# Patient Record
Sex: Female | Born: 1965 | Race: White | Hispanic: No | Smoking: Never smoker
Health system: Southern US, Community
[De-identification: ages and names within clinical notes are randomized; demographics above are authoritative.]

## PROBLEM LIST (undated history)

## (undated) DIAGNOSIS — M1990 Unspecified osteoarthritis, unspecified site: Secondary | ICD-10-CM

## (undated) DIAGNOSIS — E049 Nontoxic goiter, unspecified: Secondary | ICD-10-CM

## (undated) DIAGNOSIS — N83209 Unspecified ovarian cyst, unspecified side: Secondary | ICD-10-CM

## (undated) DIAGNOSIS — I10 Essential (primary) hypertension: Secondary | ICD-10-CM

## (undated) DIAGNOSIS — F419 Anxiety disorder, unspecified: Secondary | ICD-10-CM

## (undated) DIAGNOSIS — B019 Varicella without complication: Secondary | ICD-10-CM

## (undated) DIAGNOSIS — E119 Type 2 diabetes mellitus without complications: Secondary | ICD-10-CM

## (undated) DIAGNOSIS — M797 Fibromyalgia: Secondary | ICD-10-CM

## (undated) DIAGNOSIS — E039 Hypothyroidism, unspecified: Secondary | ICD-10-CM

## (undated) DIAGNOSIS — Z9889 Other specified postprocedural states: Secondary | ICD-10-CM

## (undated) DIAGNOSIS — F32A Depression, unspecified: Secondary | ICD-10-CM

## (undated) DIAGNOSIS — R112 Nausea with vomiting, unspecified: Secondary | ICD-10-CM

## (undated) DIAGNOSIS — F329 Major depressive disorder, single episode, unspecified: Secondary | ICD-10-CM

## (undated) HISTORY — PX: TONSILLECTOMY: SUR1361

## (undated) HISTORY — DX: Unspecified ovarian cyst, unspecified side: N83.209

## (undated) HISTORY — PX: LIPOMA EXCISION: SHX5283

## (undated) HISTORY — PX: HERNIA REPAIR: SHX51

## (undated) HISTORY — PX: CHOLECYSTECTOMY: SHX55

## (undated) HISTORY — PX: TUMOR EXCISION: SHX421

## (undated) HISTORY — DX: Varicella without complication: B01.9

## (undated) HISTORY — DX: Unspecified osteoarthritis, unspecified site: M19.90

---

## 2010-10-24 ENCOUNTER — Inpatient Hospital Stay (INDEPENDENT_AMBULATORY_CARE_PROVIDER_SITE_OTHER)
Admission: RE | Admit: 2010-10-24 | Discharge: 2010-10-24 | Disposition: A | Discharge status: Home / Self Care | Payer: BC Managed Care – PPO | Source: Ambulatory Visit | Attending: Family Medicine | Admitting: Family Medicine

## 2010-10-24 ENCOUNTER — Encounter: Payer: Self-pay | Admitting: Family Medicine

## 2010-10-24 ENCOUNTER — Inpatient Hospital Stay
Admission: RE | Admit: 2010-10-24 | Payer: BC Managed Care – PPO | Source: Ambulatory Visit | Admitting: Emergency Medicine

## 2010-10-24 DIAGNOSIS — J069 Acute upper respiratory infection, unspecified: Secondary | ICD-10-CM

## 2010-10-24 DIAGNOSIS — J029 Acute pharyngitis, unspecified: Secondary | ICD-10-CM

## 2010-10-25 ENCOUNTER — Encounter (INDEPENDENT_AMBULATORY_CARE_PROVIDER_SITE_OTHER): Payer: Self-pay | Admitting: *Deleted

## 2010-10-26 ENCOUNTER — Telehealth (INDEPENDENT_AMBULATORY_CARE_PROVIDER_SITE_OTHER): Payer: Self-pay | Admitting: Emergency Medicine

## 2010-10-28 ENCOUNTER — Telehealth (INDEPENDENT_AMBULATORY_CARE_PROVIDER_SITE_OTHER): Payer: Self-pay | Admitting: *Deleted

## 2011-01-02 NOTE — Telephone Encounter (Signed)
  Phone Note Call from Patient Call back at Home Phone [phone]   Caller: Patient Call For: extend work note Summary of Call: Pateint called on 10/27/2010 wanting her work note to be extended into 10/27/2010. I had already extended it for her once for 2 more days. I told Michelle Mueller that the doctor would have to make the decision on whether or not to extend the note again.  Dr. Cathren Harsh, please call her or let me know if its okay to extend the note again and I will write it. Thanks! Her best number is [phone] Initial call taken by: Lajean Saver RN,  October 28, 2010 1:56 PM    OK to extend note through weekend.  If still has persistent symptoms on Monday 11/01/10 should be re-checked. Donna Christen MD  October 28, 2010 2:48 PM

## 2011-01-02 NOTE — Letter (Signed)
Summary: Out of Work  [facility]  [address]   [place], [place] [address]   Phone: [phone]  Fax: [phone]    October 24, 2010   Employee:  KAREE FORGE    To Whom It May Concern:   For Medical reasons, please excuse the above named employee from work today and tomorrow.   If you need additional information, please feel free to contact our office.         Sincerely,    Donna Christen MD

## 2011-01-02 NOTE — Telephone Encounter (Signed)
  Phone Note Outgoing Call   Call placed by: Lavell Islam RN,  October 26, 2010 3:10 PM Call placed to: Patient Action Taken: Phone Call Completed Summary of Call: Spoke with patient who states felt better at 24 hours; now feeling bad again. Taking meds as ordered. Willing to see how she feels tomorrow. Initial call taken by: Lavell Islam RN,  October 26, 2010 3:11 PM

## 2011-01-02 NOTE — Letter (Signed)
Summary: Out of Work  [facility]  [address]   [place], [place] [address]   Phone: [phone]  Fax: [phone]    October 25, 2010   Employee:  SOLYMAR GRACE    To Whom It May Concern:   For Medical reasons, please excuse the above named employee from work for the following dates:  Start: 10/24/2010  Return: 10/27/2010    If you need additional information, please feel free to contact our office.         Sincerely,    Lajean Saver RN

## 2011-01-02 NOTE — Progress Notes (Signed)
Summary: sore throat ?   Vital Signs:  Patient Profile:   45 Years Old Female CC:      Swollen throat, Neg Strep test at [facility] today, Body aches, fever, headache x 7 days worse yesterday Height:     64 inches Weight:      221 pounds O2 Sat:      97 % O2 treatment:    Room Air Temp:     99.7 degrees F oral Pulse rate:   124 / minute Pulse rhythm:   regular Resp:     18 per minute BP sitting:   125 / 87  (left arm) Cuff size:   regular  Vitals Entered By: Emilio Math (October 24, 2010 11:45 AM)                  Current Allergies: No known allergies History of Present Illness Chief Complaint: Swollen throat, Neg Strep test at [facility] today, Body aches, fever, headache x 7 days worse yesterday History of Present Illness:  Subjective: Patient complains of onset of myalgias and fatigue about 5 days ago that improved, then 2 days ago developed a severe sore throat.  She then developed recurrent myalgias with chills/sweats.  No improvement with ibuprofen.  She has a history of seasonal allergies.  Negative rapid strep test at [facility] this morning. Minimal cough No pleuritic pain No wheezing + mild nasal congestion + post-nasal drainage No sinus pain/pressure No itchy/red eyes No earache No hemoptysis No SOB No nausea No vomiting No abdominal pain No diarrhea No skin rashes No headache Used OTC meds without relief (Zyrtec)   REVIEW OF SYSTEMS Constitutional Symptoms       Complains of fever.     Denies chills, night sweats, weight loss, weight gain, and fatigue.  Eyes       Denies change in vision, eye pain, eye discharge, glasses, contact lenses, and eye surgery. Ear/Nose/Throat/Mouth       Complains of sinus problems, sore throat, and hoarseness.      Denies hearing loss/aids, change in hearing, ear pain, ear discharge, dizziness, frequent runny nose, frequent nose bleeds, and tooth pain or bleeding.  Respiratory       Denies dry cough,  productive cough, wheezing, shortness of breath, asthma, bronchitis, and emphysema/COPD.  Cardiovascular       Denies murmurs, chest pain, and tires easily with exhertion.    Gastrointestinal       Denies stomach pain, nausea/vomiting, diarrhea, constipation, blood in bowel movements, and indigestion. Genitourniary       Denies painful urination, kidney stones, and loss of urinary control. Neurological       Complains of headaches.      Denies paralysis, seizures, and fainting/blackouts. Musculoskeletal       Complains of muscle pain and joint pain.      Denies joint stiffness, decreased range of motion, redness, swelling, muscle weakness, and gout.  Skin       Denies bruising, unusual mles/lumps or sores, and hair/skin or nail changes.  Psych       Denies mood changes, temper/anger issues, anxiety/stress, speech problems, depression, and sleep problems.  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  Extraocular movement is intact.  Conjunctivae are not inflamed.  Ears:  Canals normal.  Tympanic membranes normal.   Pharynx:  Erythematous and slightly swollen without obstruction.  Minimal exudate.  Neck:  Supple.  Tender enlarged anterior/posterior nodes are palpated bilaterally.  Lungs:  Clear to auscultation.  Breath sounds are equal.  Heart:  Regular rate and rhythm without murmurs, rubs, or gallops.  Abdomen:   Mild tenderness over spleen without masses or hepatosplenomegaly.  Bowel sounds are present.  No CVA or flank tenderness.  Extremities:  No edema.   Skin:  No rash CBC:  WBC 16.8 ; LY 9.5, MO 4.9, GR 85.6; Hgb 13.5  Assessment New Problems: UPPER RESPIRATORY INFECTION, ACUTE  (ICD-465.9) ACUTE PHARYNGITIS (ICD-462)  ? FALSE POSITVE GROUP A STREP; ? MONO SUSPECT VIRAL URI ALSO  Plan New Medications/Changes: BENZONATATE 200 MG CAPS (BENZONATATE) One by mouth hs as needed cough  #12 x 0, 10/24/2010, Donna Christen MD PREDNISONE 10 MG TABS (PREDNISONE) 2 PO BID for 2 days, then 1 BID for 2 days, then 1 daily for 2 days.  Take PC  #14 x 0, 10/24/2010, Donna Christen MD CEFDINIR 300 MG CAPS (CEFDINIR) 1 by mouth q12hr  #20 x 0, 10/24/2010, Donna Christen MD  New Orders: T-Culture, Throat [[id number]] Rapid Strep [[address]] Pulse Oximetry (single measurment) [91478] New Patient Level IV [29562] Planning Comments:   Throat culture pending. Empirically begin Omnicef, tapering course of prednisone, expectorant/decongestant, cough suppressant at bedtime.  Increase fluid intake Followup with PCP if not improving 7 to 10 days   The patient and/or caregiver has been counseled thoroughly with regard to medications prescribed including dosage, schedule, interactions, rationale for use, and possible side effects and they verbalize understanding.  Diagnoses and expected course of recovery discussed and will return if not improved as expected or if the condition worsens. Patient and/or caregiver verbalized understanding.  Prescriptions: BENZONATATE 200 MG CAPS (BENZONATATE) One by mouth hs as needed cough  #12 x 0   Entered and Authorized by:   Donna Christen MD   Signed by:   Donna Christen MD on 10/24/2010   Method used:   Print then Give to Patient   RxID:   [id number] PREDNISONE 10 MG TABS (PREDNISONE) 2 PO BID for 2 days, then 1 BID for 2 days, then 1 daily for 2 days.  Take PC  #14 x 0   Entered and Authorized by:   Donna Christen MD   Signed by:   Donna Christen MD on 10/24/2010   Method used:   Print then Give to Patient   RxID:   [id number] CEFDINIR 300 MG CAPS (CEFDINIR) 1 by mouth q12hr  #20 x 0   Entered and Authorized by:   Donna Christen MD   Signed by:    Donna Christen MD on 10/24/2010   Method used:   Print then Give to Patient   RxID:   [id number]   Patient Instructions: 1)  Take Mucinex D (guaifenesin with decongestant) twice daily for congestion. 2)  Increase fluid intake, rest. 3)  May use Afrin nasal spray (or generic oxymetazoline) twice daily for about 5 days.  Also recommend using saline nasal spray several times daily and/or saline nasal irrigation. 4)  Followup with family doctor if not improving 7 to 10 days.   Orders Added: 1)  T-Culture, Throat [[id number]] 2)  Rapid Strep [84166] 3)  Pulse Oximetry (single measurment) [94760] 4)  New Patient Level IV [06301]

## 2011-01-18 ENCOUNTER — Ambulatory Visit (INDEPENDENT_AMBULATORY_CARE_PROVIDER_SITE_OTHER): Payer: BC Managed Care – PPO | Admitting: Obstetrics & Gynecology

## 2011-01-18 ENCOUNTER — Encounter: Payer: Self-pay | Admitting: Obstetrics & Gynecology

## 2011-01-18 VITALS — BP 138/89 | HR 76 | Temp 97.5°F | Ht 64.0 in | Wt 227.0 lb

## 2011-01-18 DIAGNOSIS — Z0000 Encounter for general adult medical examination without abnormal findings: Secondary | ICD-10-CM

## 2011-01-18 DIAGNOSIS — R5383 Other fatigue: Secondary | ICD-10-CM

## 2011-01-18 DIAGNOSIS — R197 Diarrhea, unspecified: Secondary | ICD-10-CM

## 2011-01-18 DIAGNOSIS — R5381 Other malaise: Secondary | ICD-10-CM

## 2011-01-18 DIAGNOSIS — Z113 Encounter for screening for infections with a predominantly sexual mode of transmission: Secondary | ICD-10-CM

## 2011-01-18 DIAGNOSIS — Z1272 Encounter for screening for malignant neoplasm of vagina: Secondary | ICD-10-CM

## 2011-01-18 DIAGNOSIS — K529 Noninfective gastroenteritis and colitis, unspecified: Secondary | ICD-10-CM

## 2011-01-18 DIAGNOSIS — Z800 Family history of malignant neoplasm of digestive organs: Secondary | ICD-10-CM | POA: Insufficient documentation

## 2011-01-18 MED ORDER — METRONIDAZOLE 500 MG PO TABS: 500.0000 mg | ORAL_TABLET | Freq: Two times a day (BID) | ORAL | Status: AC

## 2011-01-18 NOTE — Progress Notes (Signed)
  Subjective:     Michelle Mueller is a 45 y.o. female here for a routine exam.  Current complaints: lethargy, diarrhea after every meal, occasional pain with intercourse (deep penetration).  Personal health questionnaire reviewed: yes.   Gynecologic History No LMP recorded. Patient is not currently having periods (Reason: IUD). Contraception: IUD Last Pap: 2011. Results were: normal per pt Last mammogram: 2011. Results were: normal per pt  Obstetric History OB History    Grav Para Term Preterm Abortions TAB SAB Ect Mult Living   2 2             # Outc Date GA Lbr Len/2nd Wgt Sex Del Anes PTL Lv   1 PAR            2 PAR                The following portions of the patient's history were reviewed and updated as appropriate: allergies, current medications, past family history, past medical history, past social history, past surgical history and problem list.  Review of Systems A comprehensive review of systems was negative except for: Constitutional: positive for malaise and wt gain Gastrointestinal: positive for diarrhea Genitourinary: positive for sexual problems    Objective:    BP 138/89  Pulse 76  Temp(Src) 97.5 F (36.4 C) (Oral)  Ht 5\' 4"  (1.626 m)  Wt 227 lb (102.967 kg)  BMI 38.96 kg/m2 General appearance: alert, cooperative and no distress Head: Normocephalic, without obvious abnormality, atraumatic Throat: lips, mucosa, and tongue normal; teeth and gums normal Neck: no adenopathy, no carotid bruit, supple, symmetrical, trachea midline and thyroid not enlarged, symmetric, no tenderness/mass/nodules Lungs: clear to auscultation bilaterally Breasts: normal appearance, no masses or tenderness Heart: regular rate and rhythm Abdomen: soft, non-tender; bowel sounds normal; no masses,  no organomegaly Pelvic: cervix normal in appearance, external genitalia normal, no adnexal masses or tenderness, no bladder tenderness, no cervical motion tenderness, rectovaginal septum  normal, uterus normal size, shape, and consistency and vagina normal without discharge Extremities: extremities normal, atraumatic, no cyanosis or edema Skin: Skin color, texture, turgor normal. No rashes or lesions    Assessment:    Healthy female exam.    Plan:    Mammogram ordered. Flagyl for fishy odor after sex   Take genetic survey for Lynch Referral to GI for diarrhea If flagyl doesn't help fishy odor, will do BS assure If pain continues with intercourse, then willg et [place] Pap GC/Chlam

## 2011-01-18 NOTE — Patient Instructions (Signed)
Celiac Disease Celiac disease is a digestive disease that causes your body's natural defense system (immune system) to react against its own cells. It interferes with taking in (absorbing) nutrients from food. Celiac disease is also known as celiac sprue, nontropical sprue, and gluten-sensitive enteropathy. People who have celiac disease cannot tolerate gluten. Gluten is a protein found in wheat, rye, and barley. With time, celiac disease will damage the cells lining the small intestine. This leads to being unable to absorb nutrients from food (malabsorption), diarrhea, and nutritional problems. CAUSES  Celiac disease is genetic. This means you have a higher likelihood of getting the disease if someone in your family has or has had it. Up to 10% of your close relatives (parent, sibling, child) may also have the disease.  People with celiac disease tend to have other autoimmune diseases. The link may be genetic. These diseases include:  Dermatitis herpetiformis.   Thyroid disease.   Systemic lupus erythematosis.   Type 1 diabetes.   Liver disease.   Collagen vascular disease.   Rheumatoid arthritis.   Sjgren's syndrome.  SYMPTOMS  The symptoms of celiac disease vary from person to person. The symptoms are generally digestive or nutritional. Digestive symptoms include:  Recurring belly (abdominal) bloating and pain.   Gas.   Long-term (chronic) diarrhea.   Pale, bad-smelling, greasy, or oily stool.  Nutritional symptoms include:  Failure to thrive in infants.   Delayed growth in children.   Weight loss in children and adults.   Missed menstrual periods (often due to extreme weight loss).   Anemia.   Weakening bones (osteoporosis).   Fatigue and weakness.   Tingling or other signs of nerve damage (peripheral neuropathy).   Depression.  DIAGNOSIS  If your symptoms and physical exam suggest that a digestive disorder or malnutrition is present, your caregiver may  suspect celiac disease. You may have already begun a gluten-free diet. If symptoms persist, testing may be needed to confirm the diagnosis. Some tests are best done while you are on a normal, unrestricted diet. Tests may include:  Blood tests to check for nutritional deficiencies.   Blood tests to look for evidence that the body is producing antibodies against its own small intestine cells.   Taking a tissue sample (biopsy) from the small bowel for evaluation.   X-rays of the small bowel.   Evaluating the stool for fat.   Tests to check for nutrient absorption from the intestine.  TREATMENT  It is important to seek treatment. Untreated celiac disease can cause growth problems (in children), anemia, osteoporosis, and possible nerve problems. A pregnant patient with untreated celiac disease has a higher risk of miscarriage, and the fetus has an increased risk of low birth weight and other growth problems. If celiac disease is diagnosed in the early stages, treatment can allow you to live a long, nearly symptom-free life. Treatment includes following a gluten-free diet. This means avoiding all foods that contain gluten. Eating even a small amount of gluten can damage your intestine. For most people, following this diet will stop symptoms. It will heal existing intestinal damage and prevent further damage. Improvements begin within days of starting the diet. The small intestine is usually completely healed within 3 to 6 months, or it may take up to 2 years for older adults. A small percentage of people do not improve on the gluten-free diet. Depending on your age and the stage at which you were diagnosed, some problems such as delayed growth and discolored teeth may  not improve. Sometimes, damaged intestines cannot heal. If your intestines are not absorbing enough nutrients, you may need to receive nutrition supplements through an intravenous (IV) tube. Drug treatments are being tested for unresponsive  celiac disease. In this case, you may need to be evaluated for complications of the disease. Your caregiver may also recommend:  A pneumonia vaccination.   Nutrients and other treatments for any nutritional deficiencies.  Your caregiver can provide you with more information on a gluten-free diet. Discussion with a dietician skilled in this illness will be valuable. Support groups may also be helpful. HOME CARE INSTRUCTIONS   Focus on a gluten-free diet. This diet must become a way of life.   Monitor your response to the gluten-free diet and treat any nutritional deficiencies.   Prepare ahead of time if you decide to eat outside the home.   Make and keep your regular follow-up visits with your caregiver.   Suggest to family members that they get screened for early signs of the disease.  SEEK MEDICAL CARE IF:   You continue to have digestive symptoms (gas, cramping, diarrhea) despite a proper diet.   You have trouble sticking to the gluten-free diet.   You develop an itchy rash with groups of tiny blisters.   You develop severe weakness, balance problems, menstrual problems, or depression.  Document Released: 01/16/2005 Document Revised: 09/28/2010 Document Reviewed: 05/05/2009 [facility] Patient Information 2012 [place], [place].

## 2011-01-19 LAB — CBC
Platelets: 333 10*3/uL (ref 150–400)
RBC: 4.96 MIL/uL (ref 3.87–5.11)
RDW: 14.1 % (ref 11.5–15.5)
WBC: 8.1 10*3/uL (ref 4.0–10.5)

## 2011-01-19 LAB — TSH: TSH: 2.036 u[IU]/mL (ref 0.350–4.500)

## 2011-04-07 ENCOUNTER — Emergency Department (INDEPENDENT_AMBULATORY_CARE_PROVIDER_SITE_OTHER)
Admission: EM | Admit: 2011-04-07 | Discharge: 2011-04-07 | Disposition: A | Discharge status: Home / Self Care | Payer: BC Managed Care – PPO | Source: Home / Self Care | Attending: Emergency Medicine | Admitting: Emergency Medicine

## 2011-04-07 DIAGNOSIS — J069 Acute upper respiratory infection, unspecified: Secondary | ICD-10-CM

## 2011-04-07 DIAGNOSIS — J209 Acute bronchitis, unspecified: Secondary | ICD-10-CM

## 2011-04-07 MED ORDER — AZITHROMYCIN 250 MG PO TABS: ORAL_TABLET | ORAL | Status: AC

## 2011-04-07 NOTE — ED Provider Notes (Addendum)
History     CSN: [id number]  Arrival date & time 04/07/11  1700   First MD Initiated Contact with Patient 04/07/11 1723      Chief Complaint  Patient presents with  . Fever  . Cough    (Consider location/radiation/quality/duration/timing/severity/associated sxs/prior treatment) HPI Michelle Mueller is a 46 y.o. female who complains of onset of cold symptoms for 5 days.  +/- sore throat + cough No pleuritic pain No wheezing + nasal congestion + post-nasal drainage +/- sinus pain/pressure + chest congestion No itchy/red eyes No earache No hemoptysis No SOB No chills/sweats No fever No nausea No vomiting No abdominal pain No diarrhea No skin rashes No fatigue No myalgias No headache    Past Medical History  Diagnosis Date  . Asthma   . Ovarian cyst     Past Surgical History  Procedure Date  . Cholecystectomy   . Cesarean section   . Tonsillectomy   . Hernia repair   . Lipoma excision     L breast  . Tumor excision     Nerve sheath tumor, R arm    Family History  Problem Relation Age of Onset  . Heart disease Father   . Lung cancer Father   . Colon cancer Maternal Aunt   . Pancreatic cancer Maternal Grandmother   . Pancreatic cancer Maternal Grandfather   . Pancreatic cancer Paternal Grandmother   . Cancer Paternal Grandfather     History  Substance Use Topics  . Smoking status: Never Smoker   . Smokeless tobacco: Never Used  . Alcohol Use: No    OB History    Grav Para Term Preterm Abortions TAB SAB Ect Mult Living   2 2              Review of Systems  All other systems reviewed and are negative.    Allergies  Erythromycin  Home Medications   Current Outpatient Rx  Name Route Sig Dispense Refill  . AZITHROMYCIN 250 MG PO TABS  Use as directed 1 each 0  . VITAMIN D-3 5000 UNITS PO TABS Oral Take 2 tablets by mouth every other day.      [name] LEVONORGESTREL 20 MCG/24HR IU IUD Intrauterine 1 each by Intrauterine route once.        BP  122/86  Pulse 80  Temp(Src) 98.2 F (36.8 C) (Oral)  Resp 18  Ht 5\' 4"  (1.626 m)  Wt 232 lb (105.235 kg)  BMI 39.82 kg/m2  SpO2 99%  Physical Exam  Nursing note and vitals reviewed. Constitutional: She is oriented to person, place, and time. She appears well-developed and well-nourished.  HENT:  Head: Normocephalic and atraumatic.  Right Ear: Tympanic membrane, external ear and ear canal normal.  Left Ear: Tympanic membrane, external ear and ear canal normal.  Nose: Mucosal edema and rhinorrhea present.  Mouth/Throat: Posterior oropharyngeal erythema present. No oropharyngeal exudate or posterior oropharyngeal edema.  Eyes: No scleral icterus.  Neck: Neck supple.  Cardiovascular: Regular rhythm and normal heart sounds.   Pulmonary/Chest: Effort normal and breath sounds normal. No respiratory distress.  Neurological: She is alert and oriented to person, place, and time.  Skin: Skin is warm and dry.  Psychiatric: She has a normal mood and affect. Her speech is normal.    ED Course  Procedures (including critical care time)  Labs Reviewed - No data to display No results found.   1. Acute bronchitis   2. Acute upper respiratory infections of unspecified site  MDM  1)  Take the prescribed antibiotic as instructed. 2)  Use nasal saline solution (over the counter) at least 3 times a day. 3)  Use over the counter decongestants like Zyrtec-D every 12 hours as needed to help with congestion.  If you have hypertension, do not take medicines with sudafed.  4)  Can take tylenol every 6 hours or motrin every 8 hours for pain or fever. 5)  Follow up with your primary doctor if no improvement in 5-7 days, sooner if increasing pain, fever, or new symptoms.      Marlaine Hind, MD 04/07/11 1724  Despite allergy to E-mycin, pt has taken Zpak previously and states that it works well.  Marlaine Hind, MD 04/07/11 1800

## 2011-04-07 NOTE — ED Notes (Signed)
Cough, fever, HA started Sunday

## 2011-07-18 ENCOUNTER — Other Ambulatory Visit: Payer: Self-pay | Admitting: Endocrinology

## 2011-07-18 DIAGNOSIS — E041 Nontoxic single thyroid nodule: Secondary | ICD-10-CM

## 2011-07-19 ENCOUNTER — Ambulatory Visit
Admission: RE | Admit: 2011-07-19 | Discharge: 2011-07-19 | Disposition: A | Discharge status: Home / Self Care | Payer: BC Managed Care – PPO | Source: Ambulatory Visit | Attending: Endocrinology | Admitting: Endocrinology

## 2011-07-19 ENCOUNTER — Other Ambulatory Visit (HOSPITAL_COMMUNITY)
Admission: RE | Admit: 2011-07-19 | Discharge: 2011-07-19 | Disposition: A | Discharge status: Home / Self Care | Payer: BC Managed Care – PPO | Source: Ambulatory Visit | Attending: Interventional Radiology | Admitting: Interventional Radiology

## 2011-07-19 DIAGNOSIS — E049 Nontoxic goiter, unspecified: Secondary | ICD-10-CM | POA: Insufficient documentation

## 2011-07-19 DIAGNOSIS — E041 Nontoxic single thyroid nodule: Secondary | ICD-10-CM

## 2011-07-25 ENCOUNTER — Other Ambulatory Visit: Payer: Self-pay | Admitting: *Deleted

## 2011-07-25 DIAGNOSIS — M545 Low back pain, unspecified: Secondary | ICD-10-CM

## 2011-07-28 ENCOUNTER — Other Ambulatory Visit: Payer: BC Managed Care – PPO

## 2011-07-28 ENCOUNTER — Ambulatory Visit
Admission: RE | Admit: 2011-07-28 | Discharge: 2011-07-28 | Disposition: A | Discharge status: Home / Self Care | Payer: BC Managed Care – PPO | Source: Ambulatory Visit | Attending: Chiropractic Medicine | Admitting: Chiropractic Medicine

## 2011-07-28 DIAGNOSIS — M545 Low back pain, unspecified: Secondary | ICD-10-CM

## 2011-08-01 ENCOUNTER — Other Ambulatory Visit: Payer: Self-pay | Admitting: Chiropractic Medicine

## 2011-08-01 DIAGNOSIS — M545 Low back pain, unspecified: Secondary | ICD-10-CM

## 2011-11-23 ENCOUNTER — Other Ambulatory Visit: Payer: Self-pay | Admitting: Neurological Surgery

## 2011-12-04 ENCOUNTER — Encounter (HOSPITAL_COMMUNITY): Payer: Self-pay

## 2011-12-07 ENCOUNTER — Encounter (HOSPITAL_COMMUNITY): Payer: Self-pay

## 2011-12-07 ENCOUNTER — Encounter (HOSPITAL_COMMUNITY)
Admission: RE | Admit: 2011-12-07 | Discharge: 2011-12-07 | Disposition: A | Discharge status: Home / Self Care | Payer: BC Managed Care – PPO | Source: Ambulatory Visit | Attending: Neurological Surgery | Admitting: Neurological Surgery

## 2011-12-07 ENCOUNTER — Emergency Department (HOSPITAL_COMMUNITY)
Admission: EM | Admit: 2011-12-07 | Discharge: 2011-12-07 | Disposition: A | Discharge status: Home / Self Care | Payer: BC Managed Care – PPO

## 2011-12-07 HISTORY — DX: Type 2 diabetes mellitus without complications: E11.9

## 2011-12-07 HISTORY — DX: Other specified postprocedural states: Z98.890

## 2011-12-07 HISTORY — DX: Nausea with vomiting, unspecified: R11.2

## 2011-12-07 HISTORY — DX: Essential (primary) hypertension: I10

## 2011-12-07 HISTORY — DX: Hypothyroidism, unspecified: E03.9

## 2011-12-07 LAB — CBC
Hemoglobin: 14.1 g/dL (ref 12.0–15.0)
MCHC: 33.7 g/dL (ref 30.0–36.0)
RBC: 4.99 MIL/uL (ref 3.87–5.11)
WBC: 8.2 10*3/uL (ref 4.0–10.5)

## 2011-12-07 LAB — TYPE AND SCREEN
ABO/RH(D): O POS
Antibody Screen: NEGATIVE

## 2011-12-07 LAB — BASIC METABOLIC PANEL
BUN: 16 mg/dL (ref 6–23)
GFR calc Af Amer: >90 mL/min (ref >90)
GFR calc non Af Amer: 81 mL/min — ABNORMAL LOW (ref >90)
Potassium: 4.3 mEq/L (ref 3.5–5.1)
Sodium: 137 mEq/L (ref 135–145)

## 2011-12-07 LAB — SURGICAL PCR SCREEN
MRSA, PCR: NEGATIVE
Staphylococcus aureus: NEGATIVE

## 2011-12-07 LAB — HCG, SERUM, QUALITATIVE: Preg, Serum: NEGATIVE

## 2011-12-07 NOTE — Progress Notes (Signed)
Pt has IUD,but still has break thru periods. Will do serum preg test pat

## 2011-12-07 NOTE — Pre-Procedure Instructions (Signed)
20 Jadah Bobak  12/07/2011   Your procedure is scheduled on:  12/18/11  Report to [facility] at 530 AM.  Call this number if you have problems the morning of surgery: [phone]   Remember:   Do not eat food:After Midnight.    Take these medicines the morning of surgery with A SIP OF WATER: hydrocodone,synthroid   Do not wear jewelry, make-up or nail polish.  Do not wear lotions, powders, or perfumes. You may wear deodorant.  Do not shave 48 hours prior to surgery. Men may shave face and neck.  Do not bring valuables to the hospital.  Contacts, dentures or bridgework may not be worn into surgery.  Leave suitcase in the car. After surgery it may be brought to your room.  For patients admitted to the hospital, checkout time is 11:00 AM the day of discharge.   Patients discharged the day of surgery will not be allowed to drive home.  Name and phone number of your driver: family  Special Instructions: Shower using CHG 2 nights before surgery and the night before surgery.  If you shower the day of surgery use CHG.  Use special wash - you have one bottle of CHG for all showers.  You should use approximately 1/3 of the bottle for each shower.   Please read over the following fact sheets that you were given: Pain Booklet, Coughing and Deep Breathing, Blood Transfusion Information, MRSA Information and Surgical Site Infection Prevention

## 2011-12-11 NOTE — Progress Notes (Signed)
RN spoke with PA orthopedics, PA confirmed pt to be discharged to SNF with foley catheter and PICC line.  SW and pt notified.  Efraim Kaufmann

## 2011-12-13 ENCOUNTER — Other Ambulatory Visit: Payer: Self-pay

## 2011-12-18 ENCOUNTER — Ambulatory Visit (HOSPITAL_COMMUNITY)
Admission: RE | Admit: 2011-12-18 | Payer: BC Managed Care – PPO | Source: Ambulatory Visit | Admitting: Neurological Surgery

## 2011-12-18 ENCOUNTER — Encounter (HOSPITAL_COMMUNITY): Payer: Self-pay | Admitting: Anesthesiology

## 2011-12-18 ENCOUNTER — Inpatient Hospital Stay (HOSPITAL_COMMUNITY)
Admission: RE | Admit: 2011-12-18 | Discharge: 2011-12-20 | DRG: 756 | Disposition: A | Discharge status: Home / Self Care | Payer: BC Managed Care – PPO | Source: Ambulatory Visit | Attending: Neurological Surgery | Admitting: Neurological Surgery

## 2011-12-18 ENCOUNTER — Inpatient Hospital Stay (HOSPITAL_COMMUNITY): Payer: BC Managed Care – PPO | Admitting: Anesthesiology

## 2011-12-18 ENCOUNTER — Inpatient Hospital Stay (HOSPITAL_COMMUNITY): Payer: BC Managed Care – PPO

## 2011-12-18 ENCOUNTER — Encounter (HOSPITAL_COMMUNITY)
Admission: RE | Disposition: A | Discharge status: Home / Self Care | Payer: Self-pay | Source: Ambulatory Visit | Attending: Neurological Surgery

## 2011-12-18 ENCOUNTER — Encounter (HOSPITAL_COMMUNITY): Admission: RE | Payer: Self-pay | Source: Ambulatory Visit

## 2011-12-18 ENCOUNTER — Encounter (HOSPITAL_COMMUNITY): Payer: Self-pay | Admitting: Surgery

## 2011-12-18 DIAGNOSIS — M47817 Spondylosis without myelopathy or radiculopathy, lumbosacral region: Principal | ICD-10-CM | POA: Diagnosis present

## 2011-12-18 DIAGNOSIS — M5030 Other cervical disc degeneration, unspecified cervical region: Secondary | ICD-10-CM | POA: Diagnosis present

## 2011-12-18 DIAGNOSIS — K449 Diaphragmatic hernia without obstruction or gangrene: Secondary | ICD-10-CM | POA: Diagnosis present

## 2011-12-18 DIAGNOSIS — Z01818 Encounter for other preprocedural examination: Secondary | ICD-10-CM

## 2011-12-18 DIAGNOSIS — E041 Nontoxic single thyroid nodule: Secondary | ICD-10-CM | POA: Diagnosis present

## 2011-12-18 DIAGNOSIS — Z23 Encounter for immunization: Secondary | ICD-10-CM

## 2011-12-18 DIAGNOSIS — Z01812 Encounter for preprocedural laboratory examination: Secondary | ICD-10-CM

## 2011-12-18 DIAGNOSIS — Z01810 Encounter for preprocedural cardiovascular examination: Secondary | ICD-10-CM

## 2011-12-18 DIAGNOSIS — IMO0002 Reserved for concepts with insufficient information to code with codable children: Secondary | ICD-10-CM

## 2011-12-18 DIAGNOSIS — J45909 Unspecified asthma, uncomplicated: Secondary | ICD-10-CM | POA: Diagnosis present

## 2011-12-18 DIAGNOSIS — I10 Essential (primary) hypertension: Secondary | ICD-10-CM | POA: Diagnosis present

## 2011-12-18 DIAGNOSIS — M47816 Spondylosis without myelopathy or radiculopathy, lumbar region: Secondary | ICD-10-CM

## 2011-12-18 DIAGNOSIS — E039 Hypothyroidism, unspecified: Secondary | ICD-10-CM | POA: Diagnosis present

## 2011-12-18 DIAGNOSIS — M5020 Other cervical disc displacement, unspecified cervical region: Secondary | ICD-10-CM | POA: Diagnosis present

## 2011-12-18 HISTORY — PX: ANTERIOR LUMBAR FUSION: SHX1170

## 2011-12-18 HISTORY — PX: ABDOMINAL EXPOSURE: SHX5708

## 2011-12-18 LAB — GLUCOSE, CAPILLARY: Glucose-Capillary: 88 mg/dL (ref 70–99)

## 2011-12-18 SURGERY — ANTERIOR LUMBAR FUSION 1 LEVEL
Anesthesia: General

## 2011-12-18 SURGERY — ANTERIOR LUMBAR FUSION 1 LEVEL
Anesthesia: General | Site: Spine Lumbar | Wound class: Clean

## 2011-12-18 MED ORDER — HYDROCODONE-ACETAMINOPHEN 5-325 MG PO TABS: 1.0000 | ORAL_TABLET | Freq: Two times a day (BID) | ORAL | Status: DC | PRN

## 2011-12-18 MED ORDER — 0.9 % SODIUM CHLORIDE (POUR BTL) OPTIME
TOPICAL | Status: DC | PRN
  Administered 2011-12-18: 1000 mL

## 2011-12-18 MED ORDER — MORPHINE SULFATE 2 MG/ML IJ SOLN: 1.0000 mg | INTRAMUSCULAR | Status: DC | PRN

## 2011-12-18 MED ORDER — LIDOCAINE HCL (CARDIAC) 20 MG/ML IV SOLN
INTRAVENOUS | Status: DC | PRN
  Administered 2011-12-18: 60 mg via INTRAVENOUS

## 2011-12-18 MED ORDER — HYDROMORPHONE HCL PF 1 MG/ML IJ SOLN
0.2500 mg | INTRAMUSCULAR | Status: DC | PRN
  Administered 2011-12-18 (×3): 0.5 mg via INTRAVENOUS

## 2011-12-18 MED ORDER — NEOSTIGMINE METHYLSULFATE 1 MG/ML IJ SOLN
INTRAMUSCULAR | Status: DC | PRN
  Administered 2011-12-18: 5 mg via INTRAVENOUS

## 2011-12-18 MED ORDER — OXYCODONE HCL 5 MG PO TABS: 5.0000 mg | ORAL_TABLET | Freq: Once | ORAL | Status: DC | PRN

## 2011-12-18 MED ORDER — SODIUM CHLORIDE 0.9 % IV SOLN
INTRAVENOUS | Status: AC
  Filled 2011-12-18: qty 500

## 2011-12-18 MED ORDER — ROCURONIUM BROMIDE 100 MG/10ML IV SOLN
INTRAVENOUS | Status: DC | PRN
  Administered 2011-12-18: 50 mg via INTRAVENOUS

## 2011-12-18 MED ORDER — PHENOL 1.4 % MT LIQD: 1.0000 | OROMUCOSAL | Status: DC | PRN

## 2011-12-18 MED ORDER — METFORMIN HCL ER 500 MG PO TB24
500.0000 mg | ORAL_TABLET | Freq: Every day | ORAL | Status: DC
  Administered 2011-12-19 – 2011-12-20 (×2): 500 mg via ORAL
  Filled 2011-12-18 (×3): qty 1

## 2011-12-18 MED ORDER — CEFAZOLIN SODIUM-DEXTROSE 2-3 GM-% IV SOLR
INTRAVENOUS | Status: AC
  Administered 2011-12-18: 2 g via INTRAVENOUS
  Filled 2011-12-18: qty 50

## 2011-12-18 MED ORDER — BACITRACIN 50000 UNITS IM SOLR
INTRAMUSCULAR | Status: AC
  Filled 2011-12-18: qty 1

## 2011-12-18 MED ORDER — ONDANSETRON HCL 4 MG/2ML IJ SOLN: 4.0000 mg | Freq: Four times a day (QID) | INTRAMUSCULAR | Status: DC | PRN

## 2011-12-18 MED ORDER — CEFAZOLIN SODIUM 1-5 GM-% IV SOLN
1.0000 g | Freq: Three times a day (TID) | INTRAVENOUS | Status: AC
  Administered 2011-12-19 (×2): 1 g via INTRAVENOUS
  Filled 2011-12-18 (×3): qty 50

## 2011-12-18 MED ORDER — FENTANYL CITRATE 0.05 MG/ML IJ SOLN
INTRAMUSCULAR | Status: DC | PRN
  Administered 2011-12-18 (×2): 100 ug via INTRAVENOUS
  Administered 2011-12-18: 50 ug via INTRAVENOUS

## 2011-12-18 MED ORDER — SODIUM CHLORIDE 0.9 % IV SOLN: 250.0000 mL | INTRAVENOUS | Status: DC

## 2011-12-18 MED ORDER — LEVOTHYROXINE SODIUM 25 MCG PO TABS
25.0000 ug | ORAL_TABLET | Freq: Every day | ORAL | Status: DC
  Administered 2011-12-19 – 2011-12-20 (×2): 25 ug via ORAL
  Filled 2011-12-18 (×3): qty 1

## 2011-12-18 MED ORDER — PROPOFOL 10 MG/ML IV BOLUS
INTRAVENOUS | Status: DC | PRN
  Administered 2011-12-18: 200 mg via INTRAVENOUS

## 2011-12-18 MED ORDER — SODIUM CHLORIDE 0.9 % IV SOLN
INTRAVENOUS | Status: DC | PRN
  Administered 2011-12-18: 17:00:00 via INTRAVENOUS

## 2011-12-18 MED ORDER — ONDANSETRON HCL 4 MG/2ML IJ SOLN
INTRAMUSCULAR | Status: DC | PRN
  Administered 2011-12-18: 4 mg via INTRAVENOUS

## 2011-12-18 MED ORDER — SODIUM CHLORIDE 0.9 % IJ SOLN: 3.0000 mL | INTRAMUSCULAR | Status: DC | PRN

## 2011-12-18 MED ORDER — ACETAMINOPHEN 650 MG RE SUPP: 650.0000 mg | RECTAL | Status: DC | PRN

## 2011-12-18 MED ORDER — GLYCOPYRROLATE 0.2 MG/ML IJ SOLN
INTRAMUSCULAR | Status: DC | PRN
  Administered 2011-12-18: .8 mg via INTRAVENOUS

## 2011-12-18 MED ORDER — OXYCODONE-ACETAMINOPHEN 5-325 MG PO TABS
1.0000 | ORAL_TABLET | ORAL | Status: DC | PRN
  Administered 2011-12-18 – 2011-12-20 (×6): 2 via ORAL
  Filled 2011-12-18 (×5): qty 2

## 2011-12-18 MED ORDER — THROMBIN 20000 UNITS EX SOLR
CUTANEOUS | Status: DC | PRN
  Administered 2011-12-18: 16:00:00 via TOPICAL

## 2011-12-18 MED ORDER — SODIUM CHLORIDE 0.9 % IR SOLN
Status: DC | PRN
  Administered 2011-12-18: 16:00:00

## 2011-12-18 MED ORDER — DEXAMETHASONE SODIUM PHOSPHATE 4 MG/ML IJ SOLN
INTRAMUSCULAR | Status: DC | PRN
  Administered 2011-12-18: 8 mg via INTRAVENOUS

## 2011-12-18 MED ORDER — MENTHOL 3 MG MT LOZG: 1.0000 | LOZENGE | OROMUCOSAL | Status: DC | PRN

## 2011-12-18 MED ORDER — CYCLOBENZAPRINE HCL 5 MG PO TABS
5.0000 mg | ORAL_TABLET | Freq: Every day | ORAL | Status: DC
  Administered 2011-12-19 (×2): 5 mg via ORAL
  Filled 2011-12-18 (×3): qty 1

## 2011-12-18 MED ORDER — MIDAZOLAM HCL 5 MG/5ML IJ SOLN
INTRAMUSCULAR | Status: DC | PRN
  Administered 2011-12-18: 2 mg via INTRAVENOUS

## 2011-12-18 MED ORDER — HYDROMORPHONE HCL PF 1 MG/ML IJ SOLN
INTRAMUSCULAR | Status: AC
  Filled 2011-12-18: qty 1

## 2011-12-18 MED ORDER — ONDANSETRON HCL 4 MG/2ML IJ SOLN: 4.0000 mg | INTRAMUSCULAR | Status: DC | PRN

## 2011-12-18 MED ORDER — ACETAMINOPHEN 325 MG PO TABS: 650.0000 mg | ORAL_TABLET | ORAL | Status: DC | PRN

## 2011-12-18 MED ORDER — HYDROMORPHONE HCL PF 1 MG/ML IJ SOLN
INTRAMUSCULAR | Status: DC | PRN
  Administered 2011-12-18 (×2): 0.5 mg via INTRAVENOUS

## 2011-12-18 MED ORDER — OXYCODONE HCL 5 MG/5ML PO SOLN: 5.0000 mg | Freq: Once | ORAL | Status: DC | PRN

## 2011-12-18 MED ORDER — ALUM & MAG HYDROXIDE-SIMETH 200-200-20 MG/5ML PO SUSP: 30.0000 mL | Freq: Four times a day (QID) | ORAL | Status: DC | PRN

## 2011-12-18 MED ORDER — LISINOPRIL 2.5 MG PO TABS
2.5000 mg | ORAL_TABLET | Freq: Every day | ORAL | Status: DC
Start: 2011-12-18 — End: 2011-12-20
  Administered 2011-12-19: 2.5 mg via ORAL
  Filled 2011-12-18 (×3): qty 1

## 2011-12-18 MED ORDER — OXYCODONE-ACETAMINOPHEN 5-325 MG PO TABS
ORAL_TABLET | ORAL | Status: AC
  Filled 2011-12-18: qty 2

## 2011-12-18 MED ORDER — SODIUM CHLORIDE 0.9 % IJ SOLN
3.0000 mL | Freq: Two times a day (BID) | INTRAMUSCULAR | Status: DC
  Administered 2011-12-19 (×3): 3 mL via INTRAVENOUS

## 2011-12-18 MED ORDER — DIAZEPAM 5 MG PO TABS
5.0000 mg | ORAL_TABLET | Freq: Four times a day (QID) | ORAL | Status: DC | PRN
  Administered 2011-12-18: 5 mg via ORAL

## 2011-12-18 MED ORDER — LACTATED RINGERS IV SOLN
INTRAVENOUS | Status: DC | PRN
  Administered 2011-12-18 (×2): via INTRAVENOUS

## 2011-12-18 MED ORDER — DIAZEPAM 5 MG PO TABS
ORAL_TABLET | ORAL | Status: AC
  Filled 2011-12-18: qty 1

## 2011-12-18 MED ORDER — CEFAZOLIN SODIUM-DEXTROSE 2-3 GM-% IV SOLR
INTRAVENOUS | Status: AC
  Filled 2011-12-18: qty 50

## 2011-12-18 MED ORDER — ENOXAPARIN SODIUM 40 MG/0.4ML ~~LOC~~ SOLN
40.0000 mg | SUBCUTANEOUS | Status: DC
  Administered 2011-12-19 – 2011-12-20 (×2): 40 mg via SUBCUTANEOUS
  Filled 2011-12-18 (×3): qty 0.4

## 2011-12-18 MED ORDER — CEFAZOLIN SODIUM-DEXTROSE 2-3 GM-% IV SOLR: 2.0000 g | INTRAVENOUS | Status: DC

## 2011-12-18 MED ORDER — VECURONIUM BROMIDE 10 MG IV SOLR
INTRAVENOUS | Status: DC | PRN
  Administered 2011-12-18 (×3): 2 mg via INTRAVENOUS

## 2011-12-18 SURGICAL SUPPLY — 93 items
APPLIER CLIP 11 MED OPEN (CLIP) ×3
Alphatec Solus Lumbar Spacer 14mm Large 7 degree L ×3 IMPLANT
BAG DECANTER FOR FLEXI CONT (MISCELLANEOUS) ×3 IMPLANT
BUR BARREL STRAIGHT FLUTE 4.0 (BURR) ×3 IMPLANT
BUR MATCHSTICK NEURO 3.0 LAGG (BURR) IMPLANT
CANISTER SUCTION 2500CC (MISCELLANEOUS) ×3 IMPLANT
CLIP APPLIE 11 MED OPEN (CLIP) ×2 IMPLANT
CLOTH BEACON ORANGE TIMEOUT ST (SAFETY) ×3 IMPLANT
CONT SPEC 4OZ CLIKSEAL STRL BL (MISCELLANEOUS) ×3 IMPLANT
CORDS BIPOLAR (ELECTRODE) ×3 IMPLANT
COVER BACK TABLE 24X17X13 BIG (DRAPES) ×3 IMPLANT
COVER TABLE BACK 60X90 (DRAPES) IMPLANT
DECANTER SPIKE VIAL GLASS SM (MISCELLANEOUS) ×3 IMPLANT
DERMABOND ADHESIVE PROPEN (GAUZE/BANDAGES/DRESSINGS) ×1
DERMABOND ADVANCED (GAUZE/BANDAGES/DRESSINGS)
DERMABOND ADVANCED .7 DNX12 (GAUZE/BANDAGES/DRESSINGS) IMPLANT
DERMABOND ADVANCED .7 DNX6 (GAUZE/BANDAGES/DRESSINGS) ×2 IMPLANT
DRAPE C-ARM 42X72 X-RAY (DRAPES) ×3 IMPLANT
DRAPE C-ARMOR (DRAPES) ×3 IMPLANT
DRAPE INCISE IOBAN 66X45 STRL (DRAPES) IMPLANT
DRAPE LAPAROTOMY 100X72X124 (DRAPES) ×3 IMPLANT
DRAPE POUCH INSTRU U-SHP 10X18 (DRAPES) ×3 IMPLANT
DURAPREP 26ML APPLICATOR (WOUND CARE) IMPLANT
ELECT BLADE 4.0 EZ CLEAN MEGAD (MISCELLANEOUS) ×3
ELECT REM PT RETURN 9FT ADLT (ELECTROSURGICAL) ×3
ELECTRODE BLDE 4.0 EZ CLN MEGD (MISCELLANEOUS) ×2 IMPLANT
ELECTRODE REM PT RTRN 9FT ADLT (ELECTROSURGICAL) ×2 IMPLANT
GAUZE SPONGE 4X4 16PLY XRAY LF (GAUZE/BANDAGES/DRESSINGS) IMPLANT
GLOVE BIO SURGEON STRL SZ7.5 (GLOVE) IMPLANT
GLOVE BIOGEL PI IND STRL 7.5 (GLOVE) ×2 IMPLANT
GLOVE BIOGEL PI IND STRL 8.5 (GLOVE) ×2 IMPLANT
GLOVE BIOGEL PI INDICATOR 7.5 (GLOVE) ×1
GLOVE BIOGEL PI INDICATOR 8.5 (GLOVE) ×1
GLOVE ECLIPSE 7.5 STRL STRAW (GLOVE) ×9 IMPLANT
GLOVE ECLIPSE 8.5 STRL (GLOVE) ×3 IMPLANT
GLOVE EXAM NITRILE LRG STRL (GLOVE) IMPLANT
GLOVE EXAM NITRILE MD LF STRL (GLOVE) ×3 IMPLANT
GLOVE EXAM NITRILE XL STR (GLOVE) IMPLANT
GLOVE EXAM NITRILE XS STR PU (GLOVE) IMPLANT
GLOVE OPTIFIT SS 7.5 STRL LX (GLOVE) IMPLANT
GLOVE SS BIOGEL STRL SZ 7.5 (GLOVE) ×2 IMPLANT
GLOVE SS N UNI LF 7.5 STRL (GLOVE) IMPLANT
GLOVE SUPERSENSE BIOGEL SZ 7.5 (GLOVE) ×1
GOWN BRE IMP SLV AUR LG STRL (GOWN DISPOSABLE) IMPLANT
GOWN BRE IMP SLV AUR XL STRL (GOWN DISPOSABLE) ×12 IMPLANT
GOWN STRL NON-REIN LRG LVL3 (GOWN DISPOSABLE) IMPLANT
GOWN STRL REIN 2XL LVL4 (GOWN DISPOSABLE) ×3 IMPLANT
INSERT FOGARTY 61MM (MISCELLANEOUS) IMPLANT
INSERT FOGARTY SM (MISCELLANEOUS) IMPLANT
KIT BASIN OR (CUSTOM PROCEDURE TRAY) ×3 IMPLANT
KIT ROOM TURNOVER OR (KITS) ×3 IMPLANT
LOOP VESSEL MAXI BLUE (MISCELLANEOUS) IMPLANT
LOOP VESSEL MINI RED (MISCELLANEOUS) IMPLANT
MARKER SKIN DUAL TIP RULER LAB (MISCELLANEOUS) ×3 IMPLANT
NEEDLE HYPO 25X1 1.5 SAFETY (NEEDLE) ×3 IMPLANT
NEEDLE SPNL 18GX3.5 QUINCKE PK (NEEDLE) ×3 IMPLANT
NS IRRIG 1000ML POUR BTL (IV SOLUTION) ×3 IMPLANT
PACK FOAM VITOSS 10CC (Orthopedic Implant) ×3 IMPLANT
PACK LAMINECTOMY NEURO (CUSTOM PROCEDURE TRAY) ×3 IMPLANT
PAD ARMBOARD 7.5X6 YLW CONV (MISCELLANEOUS) ×9 IMPLANT
SPONGE INTESTINAL PEANUT (DISPOSABLE) ×12 IMPLANT
SPONGE LAP 18X18 X RAY DECT (DISPOSABLE) ×3 IMPLANT
SPONGE LAP 4X18 X RAY DECT (DISPOSABLE) IMPLANT
SPONGE SURGIFOAM ABS GEL 100 (HEMOSTASIS) ×3 IMPLANT
STAPLER VISISTAT 35W (STAPLE) IMPLANT
SUT MNCRL AB 4-0 PS2 18 (SUTURE) ×3 IMPLANT
SUT PROLENE 4 0 RB 1 (SUTURE) ×4
SUT PROLENE 4-0 RB1 .5 CRCL 36 (SUTURE) ×8 IMPLANT
SUT PROLENE 5 0 CC1 (SUTURE) ×6 IMPLANT
SUT PROLENE 6 0 C 1 30 (SUTURE) ×3 IMPLANT
SUT PROLENE 6 0 CC (SUTURE) IMPLANT
SUT SILK 0 TIES 10X30 (SUTURE) ×3 IMPLANT
SUT SILK 2 0 TIES 10X30 (SUTURE) ×6 IMPLANT
SUT SILK 2 0SH CR/8 30 (SUTURE) IMPLANT
SUT SILK 3 0 TIES 10X30 (SUTURE) ×3 IMPLANT
SUT SILK 3 0SH CR/8 30 (SUTURE) IMPLANT
SUT VIC AB 0 CT1 27 (SUTURE) ×3
SUT VIC AB 0 CT1 27XBRD ANBCTR (SUTURE) ×6 IMPLANT
SUT VIC AB 1 CT1 18XBRD ANBCTR (SUTURE) ×2 IMPLANT
SUT VIC AB 1 CT1 8-18 (SUTURE) ×1
SUT VIC AB 2-0 CP2 18 (SUTURE) ×3 IMPLANT
SUT VIC AB 2-0 CT1 36 (SUTURE) ×3 IMPLANT
SUT VIC AB 3-0 SH 27 (SUTURE) ×2
SUT VIC AB 3-0 SH 27X BRD (SUTURE) ×4 IMPLANT
SUT VIC AB 3-0 SH 8-18 (SUTURE) IMPLANT
SUT VICRYL 4-0 PS2 18IN ABS (SUTURE) IMPLANT
SYR 20ML ECCENTRIC (SYRINGE) ×3 IMPLANT
SYR CONTROL 10ML LL (SYRINGE) ×3 IMPLANT
TOWEL OR 17X24 6PK STRL BLUE (TOWEL DISPOSABLE) ×3 IMPLANT
TOWEL OR 17X26 10 PK STRL BLUE (TOWEL DISPOSABLE) ×3 IMPLANT
TRAP SPECIMEN MUCOUS 40CC (MISCELLANEOUS) IMPLANT
TRAY FOLEY CATH 14FRSI W/METER (CATHETERS) ×3 IMPLANT
WATER STERILE IRR 1000ML POUR (IV SOLUTION) ×3 IMPLANT

## 2011-12-18 NOTE — Anesthesia Postprocedure Evaluation (Signed)
  Anesthesia Post-op Note  Patient: Michelle Mueller  Procedure(s) Performed: Procedure(s) (LRB) with comments: ANTERIOR LUMBAR FUSION 1 LEVEL (N/A) - Lumbar four-five Anterior Lumbar Interbody Fusion with Dr. Arbie Cookey to do anterior exposure ABDOMINAL EXPOSURE (N/A) - Anterior Expossure for anterior lumbar interbody fuson  Patient Location: PACU  Anesthesia Type:General  Level of Consciousness: awake  Airway and Oxygen Therapy: Patient Spontanous Breathing  Post-op Pain: mild  Post-op Assessment: Post-op Vital signs reviewed, Patient's Cardiovascular Status Stable, Respiratory Function Stable, Patent Airway, No signs of Nausea or vomiting and Pain level controlled  Post-op Vital Signs: stable  Complications: No apparent anesthesia complications

## 2011-12-18 NOTE — Op Note (Signed)
Preoperative diagnosis: Lumbar spondylosis and with herniated nucleus pulposus L4-L5, lumbar radiculopathy Postoperative diagnosis: Lumbar spondylosis with herniated nucleus pulposus L4-L5 with lumbar radiculopathy Procedure: Anterior lumbar decompression via total discectomy arthrodesis with allograft and Solus spacer with fixation Surgeon: Barnett Abu Approach: Tawanna Cooler early M.D. Anesthesia: Gen. endotracheal Indications: Michelle Mueller is a 46 year old individual who's had significant back and I lateral lower extremity pain she is failed extensive efforts at conservative management including chiropractic manipulations physical therapy epidural steroid injections. She's been advised regarding surgical decompression via an anterior approach.  Procedure: Patient was brought to the operating room supine on the table after the smooth induction of general endotracheal anesthesia Dr. Tawanna Cooler early performed an approach to the retroperitoneal space via the anterior retroperitoneal root. Once L4-5 is isolated I verified this level with fluoroscopic radiography. Then with brow retractors in place I opened anteriorly longitudinal ligament over L4-L5. A series of Cobb elevators were then used to separate the disc from the endplates and in several large pieces the disc was removed from L4-L5. As region of the posterior longitudinal ligament was reached this area was dissected free and clear. Palpation yielded no fragments of the disc that had migrated behind the vertebral body though the posterior longitudinal ligament was relieved from the endplate at the margins. Once this decompression was performed the endplates were decorticated thoroughly using a series of rasps and a tooth curettes. A high-speed drill was also used to shave some of the uneven plates at the ends of the vertebrae. Then a series of trials was attempted using various sizes of the interspace spacers and it was decided that a 7 lordotic 14 mm tall large  implant would fit best. This was loaded into the application total and filled with the cost bone sponge. The device was then placed into the interspace under radiographic verification and when it was centered well the wings were deployed to lock the device in place. This was done easily and then the implant was further packed with the cost. The cost was also placed around the outside of the spacer. Final radiographic confirmation was obtained in AP and lateral projection. The retractors were then carefully removed from the left and right sides of the vertebrae superiorly and inferiorly the wound was carefully inspected hemostasis was noted to be good with that the anterior rectus fascia was closed with a running 0 Vicryl suture 2-0 Vicryl was used in the subcutaneous tissues and 3-0 Vicryl was used to close the subcuticular skin. Blood loss for the procedure was 800 cc including the approach portion. Approximately 250 cc of Cell Saver blood was returned to the patient. Dr. early will dictate his portion of the surgery under a separate heading.

## 2011-12-18 NOTE — Transfer of Care (Signed)
Immediate Anesthesia Transfer of Care Note  Patient: Michelle Mueller  Procedure(s) Performed: Procedure(s) (LRB) with comments: ANTERIOR LUMBAR FUSION 1 LEVEL (N/A) - Lumbar four-five Anterior Lumbar Interbody Fusion with Dr. Arbie Cookey to do anterior exposure ABDOMINAL EXPOSURE (N/A) - Anterior Expossure for anterior lumbar interbody fuson  Patient Location: PACU  Anesthesia Type:General  Level of Consciousness: awake, alert  and oriented  Airway & Oxygen Therapy: Patient Spontanous Breathing  Post-op Assessment: Report given to PACU RN and Post -op Vital signs reviewed and stable  Post vital signs: Reviewed and stable  Complications: No apparent anesthesia complications

## 2011-12-18 NOTE — Op Note (Signed)
OPERATIVE REPORT  DATE OF SURGERY: 12/18/2011  PATIENT: Michelle Mueller, 46 y.o. female MRN: [id number]  DOB: 07-13-1965  PRE-OPERATIVE DIAGNOSIS: L4-5 degenerative disc disease  POST-OPERATIVE DIAGNOSIS:  Same  PROCEDURE: Exposure forALIF L4-5  SURGEON:  Gretta Began, M.D.  Co-surgeon for the exposure: Dr Barnett Abu  ANESTHESIA:  Gen.  EBL: 800 ml     BLOOD ADMINISTERED: Cell Saver  DRAINS: None    COUNTS CORRECT:  YES  PLAN OF CARE: PACU   PATIENT DISPOSITION:  PACU - hemodynamically stable  PROCEDURE DETAILS: Discussed my role with the patient in the preoperative holding area regarding anterior exposure for L4-5 disc surgery. The spleen mobilization of intraperitoneal contents to include the left ureter intraperitoneal contents and iliac artery and vein potential injury of these. Patient understands and wishes to proceed.  The patient was placed in the supine position and general anesthesia was administered. A crosstable and AP projections over the spine with C-arm was used to mark the area of the L4-5 disc of the skin. The patient was prepped and draped in usual sterile fashion. An incision was made from the midline to the left lateral area and carried down to the skin with soap and subcutaneous tissue with electrocautery. The fat was mobilized off the anterior rectus sheath and the anterior rectus sheath was opened in line with the skin incision. The rectus muscle was mobilized circumferentially. The retroperitoneal space was entered below the level of the semilunar line in the left lower quadrant the peritoneum was mobilized off the posterior rectus sheath. Posterior rectus sheath was opened laterally with scissors. There was one small rent in the peritoneum and this was closed with a 3-0 Vicryl suture. The mobilization was continued above the level of the psoas muscle and the intraperitoneal contents and ureter were reflected to the right. The iliac vessels were mobilized to  the right as well. The patient had a large iliolumbar vein. An Raeley Gilmore branching. The iliolumbar vein was ligated with a 2-0 silk tie the tube distal branches were ligated with 2-0 silk ties as well. This was then divided and further blunt mobilization was used to mobilize the vessels to the right to expose the L4-5 disc. I. there was bleeding from the pelvis with further mobilization and further visualization of the tie on the iliolumbar vein on the iliac vein side had become partially dislodged and was bleeding. This was controlled with digital pressure and then was closed with several 5-0 Prolene sutures. The Thompson retractor was then brought onto the field and the reverse 150 this replaced in the right and left of the disc. The nodule 140 blades were used for anterior and inferior exposure as well. C-arm was then used to confirm that this was the L4-5 disc. The remainder of the procedure will be dictated as a separate note by Dr. Malcolm Metro, M.D. 12/18/2011 7:27 PM

## 2011-12-18 NOTE — Anesthesia Preprocedure Evaluation (Addendum)
Anesthesia Evaluation  Patient identified by MRN, date of birth, ID band Patient awake    Reviewed: Allergy & Precautions, H&P , NPO status , Patient's Chart, lab work & pertinent test results  History of Anesthesia Complications (+) PONV  Airway Mallampati: II TM Distance: >3 FB Neck ROM: full    Dental  (+) Teeth Intact and Dental Advisory Given   Pulmonary asthma ,          Cardiovascular hypertension, Pt. on medications     Neuro/Psych negative neurological ROS  negative psych ROS   GI/Hepatic Neg liver ROS, hiatal hernia,   Endo/Other  diabetes, Type 2, Oral Hypoglycemic AgentsHypothyroidism obese  Renal/GU negative Renal ROS     Musculoskeletal negative musculoskeletal ROS (+)   Abdominal   Peds  Hematology negative hematology ROS (+)   Anesthesia Other Findings   Reproductive/Obstetrics negative OB ROS                         Anesthesia Physical Anesthesia Plan  ASA: III  Anesthesia Plan: General   Post-op Pain Management:    Induction: Intravenous  Airway Management Planned: Oral ETT  Additional Equipment:   Intra-op Plan:   Post-operative Plan: Extubation in OR  Informed Consent: I have reviewed the patients History and Physical, chart, labs and discussed the procedure including the risks, benefits and alternatives for the proposed anesthesia with the patient or authorized representative who has indicated his/her understanding and acceptance.     Plan Discussed with: CRNA and Surgeon  Anesthesia Plan Comments:         Anesthesia Quick Evaluation

## 2011-12-18 NOTE — H&P (Signed)
Michelle Mueller   DOB: 18-Mar-1965   CHIEF COMPLAINT:   Back pain with numbness and tingling, and a stabbing pain in both legs and feet.   HISTORY OF PRESENT ILLNESS:  Michelle Mueller is a 46 year old left-handed individual who has been having problems with her back and her legs since at least November of this past year.  She notes that since May the pain has been nearly continuous and persistent. She has been seeing a chiropractor, Dr. Kathrynn Speed, on a daily basis since about May and notes that she gets periods of relief where things seem to be getting better, but then invariably the pain returns as bad as it was or sometimes even a little worse.  She notes that the pain tends to cause some tingling and numbness into both lower extremities, and a stabbing pain that persists in the legs and feet.  She has not noted any particular weakness in the legs, though her stamina on her feet has been deteriorating.    She had an MRI of the lumbar spine performed on the 28th of June of this year and this study is brought in for review.  It demonstrates a central subligamentous protrusion of the disc at the level of L4-5 with some mild to moderate central and bilateral lateral recess stenosis.  The rest of her back appears to be fairly healthy on the sagittal views and the alignment is quite anatomic.    She has had two epidural steroid injections and she notes that the first injection gave her relief for about two days. The second injection gave her relief for one day.  This was done by Dr. Estella Husk over in [place]. She has been receiving some pain management with her use of Hydrocodone. She has been on Nabumetone 750 mg. twice a day.  More recently she was diagnosed with some prediabetes and has been started on Metformin 500 mg. once a day. She has also found to be hypothyroid with a thyroid nodule and she is on some replacement, taking 25 mcg. a day. She is seen by Dr. Dorisann Frames for that.  She is using  Cyclobenzaprine 5 mg. a day as a muscle relaxer.  The only other medication has been some Mirena as an oral contraceptive.    PAST MEDICAL HISTORY:  Her past medical history otherwise reveals that her general health has been fair. 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    SOCIAL HISTORY:    She notes that she does not smoke or use alcohol. She did note a 40 lb. weight gain over the past number of months due to inactivity.    REVIEW OF SYSTEMS:   Notable for wearing of glasses, leg pain while walking, back pain and leg pain noted, difficulty with memory, inability to concentrate and some thyroid disease has also been noted on a 14-point review sheet.   PHYSICAL EXAMINATION:  On physical examination, she is alert and oriented. She stands straight and erect; however, she does have some modest difficulty with walking onto her heels, more so on the left than on the right, but focal strength appears to be intact in the tibialis anterior group bilaterally.  Straight leg raising is negative to 80 degrees bilaterally.  Patrick's maneuver is negative bilaterally also.    IMPRESSION:    The patient has evidence of one-level disc disease at L4-5 with a central subligamentous disc protrusion.  At this point it seems  that her situation has been unwinding, that is things are slowly, but progressively getting worse.  She has been seeing Dr. Mardene Celeste for chiropractic adjustments on a regular basis and I suggested the addition of some McKenzie type exercises to see if we can strengthen her back.  If this process continues to unwind, I do not believe that further steroid injections are likely to be helpful, although I would give consideration to an intradiscal injection if surgery is ultimately contemplated. I indicated that surgery would necessitate not only a simple discectomy, but likely an arthrodesis of this L4-5 level. This is a substantial undertaking; however, at this time  it seems that she has not been getting better despite the passage of time of several months now.  We will see if adding the physical therapy exercises adds anything to her clinical treatment plan and if things do start to improve, then we may continue to treat this process conservatively.  Chiropractic treatment can continue as long as it is providing her some element of relief.  If things do not improve or certainly if they deteriorate, then ultimately we may need to consider an intradiscal steroid injection.   Having had intradiscal injections performed without success closed in for brief temporary relief and having failed efforts at conservative therapy the patient has resolved to proceed with anterior lumbar interbody arthrodesis at L4-L5 and she is now being admitted for this procedure.  Michelle Mueller  #[id number] DOB:  04-Oct-1965 11/16/2011:     Michelle Mueller returns to the office today to discuss the results of her MRI of the cervical spine and lumbar spine.  The cervical MRI demonstrates that Michelle Mueller has straightening of the cervical spine indicative of significant paraspinous muscular spasm.  The architecture of her neck, however, is quite normal otherwise.  She has a very minor disc bulge at C5-6 but no evidence of any neural compromise in any fashion.   I demonstrated the findings to Michelle Mueller and I indicated that at most I see some muscular spasm in the cervical spine.    As regards her lumbar spine, I note that Michelle Mueller has a central, broad-based herniation of her disc at L4-5.  This causes both central canal stenosis and some lateral recess stenosis across L4-5.  At L5-S1, she has some very modest disc degenerative changes but no evidence of any neural compromise.  The facet joints at every level in her lumbar spine are quite healthy and normal.  The biggest single process that I see is some moderate central canal stenosis at L4-5 with lateral recess stenosis.  I discussed the findings with Michelle Mueller.  She has had  some previous epidural steroid injections which would give her brief relief but she could not tolerate the epidural steroids because this made her pre-diabetic, she gained a lot of weight and there is some concern that it not good for her health in general by her primary care physician in that regard.  I believe that ultimately Michelle Mueller may need a one level decompression arthrodesis.  Because there is no involvement of the posterior structures, I believe that she may benefit from an anterior lumbar interbody arthrodesis.  I discussed doing this via an anterior peritoneal approach through her tummy and dissecting and moving the peritoneal sac over along with the great vessels to expose L4-L5.  Michelle Mueller is eager to do something definitive to get her life back in order as this process has cost her dearly in terms of keeping her out of the workplace and  progressively causing less function for her in and around the house.  I indicated that surgery can proceed whenever she would like to go ahead with it.    I asked her about the degree of stress in her life particularly regarding the cervical spine findings.  She notes that they are having some stressful events around the house with her being a single mom and having two children at home.  I believe that managing this stress can go a long way in helping alleviate some of her symptoms that may not be related to any anatomy particularly the cervical spine pain she has been experiencing.  We will plan on scheduling her surgery at the earliest convenience.    ADDENDUM:  Today in the office, we obtained lateral flexion and extension radiographs to evaluate for stability of her lumbar spine.  Two views are evaluated.  The lumbar spine demonstrates that there are modest disc degenerative changes at L5-S1 but no abnormal motion between any of the segments including L4-5.    Impression on the basis of the radiograph is normal flexion and extension films of the lumbar spine save  for degenerative changes at L4-5 and L5-S1.

## 2011-12-18 NOTE — Preoperative (Signed)
Beta Blockers   Reason not to administer Beta Blockers:Not Applicable 

## 2011-12-19 ENCOUNTER — Encounter (HOSPITAL_COMMUNITY): Payer: Self-pay | Admitting: Neurological Surgery

## 2011-12-19 LAB — GLUCOSE, CAPILLARY

## 2011-12-19 MED ORDER — PNEUMOCOCCAL VAC POLYVALENT 25 MCG/0.5ML IJ INJ
0.5000 mL | INJECTION | INTRAMUSCULAR | Status: AC
  Administered 2011-12-20: 0.5 mL via INTRAMUSCULAR
  Filled 2011-12-19: qty 0.5

## 2011-12-19 MED ORDER — INFLUENZA VIRUS VACC SPLIT PF IM SUSP
0.5000 mL | INTRAMUSCULAR | Status: AC
  Administered 2011-12-20: 0.5 mL via INTRAMUSCULAR
  Filled 2011-12-19: qty 0.5

## 2011-12-19 NOTE — Progress Notes (Signed)
Patient ID: Michelle Mueller, female   DOB: 02-28-1965, 46 y.o.   MRN: [id number] Patient is postop day 1 from ALIF L4-5. She is very comfortable. She reports no back discomfort and mild abdominal soreness. She does report some mild numbness over her anterior left thigh.  Abdominal incision looks quite good with Dermabond in place. She does have 2+ dorsalis pedis pulses bilaterally.  Patient denies any nausea or vomiting is very comfortable.  Stable postop day 1. Will not follow actively. Please contact us if we can provide further assistance.

## 2011-12-19 NOTE — Progress Notes (Signed)
Utilization Review Completed.Raahil Ong T11/19/2013   

## 2011-12-19 NOTE — Progress Notes (Signed)
Subjective: Patient reports Offers minimal complaints no abdominal pain minimal back pain only occurs when sitting for a period of time some numbness in the left anterior thigh  Objective: Vital signs in last 24 hours: Temp:  [97.2 F (36.2 C)-98.6 F (37 C)] 97.5 F (36.4 C) (11/19 1018) Pulse Rate:  [58-113] 113  (11/19 1018) Resp:  [8-20] 18  (11/19 1018) BP: (96-116)/(38-81) 98/55 mmHg (11/19 1018) SpO2:  [92 %-99 %] 99 % (11/19 1018) Weight:  [100.8 kg (222 lb 3.6 oz)] 100.8 kg (222 lb 3.6 oz) (11/18 2010)  Intake/Output from previous day: 11/18 0701 - 11/19 0700 In: 2650 [P.O.:200; I.V.:2150; Blood:300] Out: 2450 [Urine:1700; Blood:750] Intake/Output this shift:    Incision is clean and dry motor function is intact in iliopsoas quadriceps tibialis anterior and gastroc.  Lab Results: No results found for this basename: WBC:2,HGB:2,HCT:2,PLT:2 in the last 72 hours BMET No results found for this basename: NA:2,K:2,CL:2,CO2:2,GLUCOSE:2,BUN:2,CREATININE:2,CALCIUM:2 in the last 72 hours  Studies/Results: Dg Lumbar Spine 2-3 Views  12/18/2011  *RADIOLOGY REPORT*  Clinical data:  Lumbar fusion  LUMBAR SPINE INTRAOPERATIVE  Comparison:  Earlier film of the same day, and previous studies  Findings:  Two fluoroscopic spot images document fusion hardware projecting in the L4-5 interspace.  Alignment is preserved.  The upper lumbar spine is excluded.  IMPRESSION: Interbody fusion L4-5 with instrumentation.   Original Report Authenticated By: D. Andria Rhein, MD    Dg Abd 1 View  12/18/2011  *RADIOLOGY REPORT*  Clinical Data:  Post ALIF, instrument verification count  ABDOMEN - 1 VIEW  Comparison: Portable exam 1801 hours compared to lateral lumbar radiograph of 11/16/2011  Findings: Disc prosthesis and fixation device at L4-L5 disc space. Scattered cassette artifacts. IUD in pelvis.  Linear density in the pelvis inferior to the IUD. Additional tiny curvilinear density projects over the  L3-L4 disc space right of midline. Bowel gas pattern normal. No additional radiopacities identified.  IMPRESSION: Linear artifact projects over the pelvis. Curvilinear density projects over the L3-L4 disc space.  Per OR nurse, needle count is correct and the above linear and curvilinear opacities are therefore felt to represent artifacts. No retained surgical instruments or radiopaque sponge markers are identified.   Original Report Authenticated By: Ulyses Southward, M.D.    Dg C-arm 61-120 Min  12/18/2011  *RADIOLOGY REPORT*  Clinical data:  Lumbar fusion  LUMBAR SPINE INTRAOPERATIVE  Comparison:  Earlier film of the same day, and previous studies  Findings:  Two fluoroscopic spot images document fusion hardware projecting in the L4-5 interspace.  Alignment is preserved.  The upper lumbar spine is excluded.  IMPRESSION: Interbody fusion L4-5 with instrumentation.   Original Report Authenticated By: D. Andria Rhein, MD     Assessment/Plan: Stable postop day one anterior lumbar interbody arthrodesis L4-L5  LOS: 1 day  Saline lock IV encourage ambulation.   Nessa Ramaker J 12/19/2011, 11:16 AM

## 2011-12-19 NOTE — Progress Notes (Signed)
   CARE MANAGEMENT NOTE 12/19/2011  Patient:  Michelle Mueller, Michelle Mueller   Account Number:  [id number]  Date Initiated:  12/19/2011  Documentation initiated by:  [facility]  Subjective/Objective Assessment:   Anterior lumbar decompression via total discectomy arthrodesis with allograft and Solus spacer with fixation     Action/Plan:   lives at home with husband   Anticipated DC Date:  12/20/2011   Anticipated DC Plan:  HOME/SELF CARE      DC Planning Services  CM consult      Choice offered to / List presented to:             Status of service:  Completed, signed off Medicare Important Message given?   (If response is "NO", the following Medicare IM given date fields will be blank) Date Medicare IM given:   Date Additional Medicare IM given:    Discharge Disposition:  HOME/SELF CARE  Per UR Regulation:    If discussed at Long Length of Stay Meetings, dates discussed:    Comments:  12/19/2011 1545 NCM spoke to pt and no DME or HH PT is needed. No additional NCM needs are identified. Isidoro Donning RN CCM Case Mgmt phone [phone]

## 2011-12-19 NOTE — Evaluation (Signed)
Occupational Therapy Evaluation Patient Details Name: Michelle Mueller MRN: [id number] DOB: 1965/09/28 Today's Date: 12/19/2011 Time: 1308-6578 OT Time Calculation (min): 24 min  OT Assessment / Plan / Recommendation Clinical Impression  This 46 y.o. female admitted for ALF.  Pt demonstrates good awareness of back precautions and safety with ADLs.  pt will have assist with family as needed.  Pt. will benefit from OT to maximize safety and independence with BADLs to allow pt to return home modified independently    OT Assessment  Patient needs continued OT Services    Follow Up Recommendations  No OT follow up    Barriers to Discharge None    Equipment Recommendations  None recommended by PT;None recommended by OT    Recommendations for Other Services    Frequency  Min 2X/week    Precautions / Restrictions Precautions Precautions: Back Precaution Booklet Issued: Yes (comment) (handout provided by PT) Precaution Comments: Pt demonstrates independence with back precautions Restrictions Weight Bearing Restrictions: No       ADL  Eating/Feeding: Independent Where Assessed - Eating/Feeding: Edge of bed;Chair Grooming: Wash/dry hands;Supervision/safety Where Assessed - Grooming: Unsupported standing Upper Body Bathing: Supervision/safety Where Assessed - Upper Body Bathing: Unsupported standing Lower Body Bathing: Supervision/safety Where Assessed - Lower Body Bathing: Unsupported standing Upper Body Dressing: Simulated;Set up Where Assessed - Upper Body Dressing: Unsupported sitting Lower Body Dressing: Supervision/safety Where Assessed - Lower Body Dressing: Unsupported sit to stand Toilet Transfer: Supervision/safety Toilet Transfer Method: Sit to Barista: Comfort height toilet Toileting - Clothing Manipulation and Hygiene: Supervision/safety Where Assessed - Engineer, mining and Hygiene: Standing Tub/Shower Transfer:  Therapist, sports Method: Ambulating Transfers/Ambulation Related to ADLs: ambulates with supervision ADL Comments: Pt. instructed in safe techniques for BADLs, and use and acquisistion of toileting aid and reacher.  Pt demonstrates good awareness of  back precautions.  Instructed her to as MD when she can shower and how long she will be with back precautions    OT Diagnosis: Generalized weakness;Acute pain  OT Problem List: Decreased knowledge of precautions OT Treatment Interventions: Self-care/ADL training;Patient/family education;Balance training;Therapeutic activities   OT Goals Acute Rehab OT Goals OT Goal Formulation: With patient Time For Goal Achievement: 12/26/11 Potential to Achieve Goals: Good ADL Goals Additional ADL Goal #1: Pt. will verbalized understanding of use and acquisition of reacher and toileting aid ADL Goal: Additional Goal #1 - Progress: Goal set today Additional ADL Goal #2: Pt will be independent with back precautions with all activities ADL Goal: Additional Goal #2 - Progress: Goal set today  Visit Information  Last OT Received On: 12/19/11 Assistance Needed: +1    Subjective Data  Subjective: "I'm feeling really good" Patient Stated Goal: To be able to do everything again   Prior Functioning     Home Living Lives With: Family (sons 28 and 3) Available Help at Discharge: Family;Friend(s);Available 24 hours/day Type of Home: House Home Access: Stairs to enter [employer] of Steps: 4-5 Entrance Stairs-Rails: Can reach both Home Layout: Two level Alternate Level Stairs-Number of Steps: flight Alternate Level Stairs-Rails: Right Bathroom Shower/Tub: Engineer, manufacturing systems: Handicapped height Bathroom Accessibility: Yes How Accessible: Accessible via walker Home Adaptive Equipment: None Prior Function Level of Independence: Independent Able to Take Stairs?: Yes Driving: Yes Vocation: Full time  employment Communication Communication: No difficulties Dominant Hand: Right         Vision/Perception     Cognition  Overall Cognitive Status: Appears within functional limits for tasks assessed/performed Arousal/Alertness: Awake/alert  Orientation Level: Oriented X4 / Intact Behavior During Session: [facility] for tasks performed    Extremity/Trunk Assessment Right Upper Extremity Assessment RUE ROM/Strength/Tone: Within functional levels RUE Coordination: WFL - gross/fine motor Left Upper Extremity Assessment LUE ROM/Strength/Tone: Within functional levels LUE Coordination: WFL - gross/fine motor     Mobility Bed Mobility Bed Mobility: Rolling Right;Right Sidelying to Sit;Sitting - Scoot to Edge of Bed Rolling Right: 6: Modified independent (Device/Increase time) Right Sidelying to Sit: 6: Modified independent (Device/Increase time);With rails;HOB flat Sitting - Scoot to Edge of Bed: 6: Modified independent (Device/Increase time) Transfers Transfers: Sit to Stand;Stand to Sit Sit to Stand: 5: Supervision;With upper extremity assist;From bed Stand to Sit: 5: Supervision;To chair/3-in-1;With armrests;To toilet     Shoulder Instructions     Exercise     Balance     End of Session OT - End of Session Activity Tolerance: Patient tolerated treatment well Patient left: in chair;with call bell/phone within reach  GO     [name] M 12/19/2011, 3:07 PM

## 2011-12-19 NOTE — Evaluation (Signed)
Physical Therapy Evaluation Patient Details Name: Michelle Mueller MRN: [id number] DOB: [birth year] Today's Date: 12/19/2011 Time: 9629-5284 PT Time Calculation (min): 25 min  PT Assessment / Plan / Recommendation Clinical Impression  Pt s/p ALF presenting with minimal surgical pain. Patient with good family support and is progressing well with mobility. Patient safe to d/c home with friend who will provide 24/7 assist. Pt with no DME needs but will con't to benefit from PT in hospital to maximize functional recovery to achieve independent function.    PT Assessment  Patient needs continued PT services    Follow Up Recommendations  Supervision/Assistance - 24 hour    Does the patient have the potential to tolerate intense rehabilitation      Barriers to Discharge        Equipment Recommendations  None recommended by PT    Recommendations for Other Services     Frequency Min 5X/week    Precautions / Restrictions Precautions Precautions: Back Restrictions Weight Bearing Restrictions: No   Pertinent Vitals/Pain 1/10 surgical back pain       Mobility  Bed Mobility Bed Mobility: Rolling Right;Right Sidelying to Sit Rolling Right: 6: Modified independent (Device/Increase time) Right Sidelying to Sit: 5: Supervision;HOB flat Details for Bed Mobility Assistance: v/c's for log roll technique and hand placement Transfers Transfers: Sit to Stand;Stand to Sit Sit to Stand: 5: Supervision;With upper extremity assist;From bed (v/c's to maintain back precautions) Stand to Sit: 5: Supervision;To chair/3-in-1;With armrests Details for Transfer Assistance: pt with mild dizziness upon standing but resolved quickly Ambulation/Gait Ambulation/Gait Assistance: 5: Supervision Ambulation Distance (Feet): 250 Feet Assistive device: None Ambulation/Gait Assistance Details: cautious, slow, no episodes of LOB or dizziness Gait Pattern: Step-through pattern;Decreased stride length Stairs: No    Shoulder Instructions     Exercises     PT Diagnosis: Difficulty walking  PT Problem List: Decreased strength;Decreased mobility PT Treatment Interventions: Gait training;Stair training;Therapeutic activities   PT Goals Acute Rehab PT Goals PT Goal Formulation: With patient Time For Goal Achievement: 12/26/11 Potential to Achieve Goals: Good Pt will Roll Supine to Right Side: Independently PT Goal: Rolling Supine to Right Side - Progress: Goal set today Pt will go Supine/Side to Sit: Independently;with HOB 0 degrees PT Goal: Supine/Side to Sit - Progress: Goal set today Pt will go Sit to Stand: Independently;with upper extremity assist PT Goal: Sit to Stand - Progress: Goal set today Pt will Ambulate: >150 feet;Independently PT Goal: Ambulate - Progress: Goal set today Pt will Go Up / Down Stairs: Flight;with supervision;with rail(s) PT Goal: Up/Down Stairs - Progress: Goal set today Additional Goals Additional Goal #1: Pt I with recall of 3/3 back precautions and 100% compliant PT Goal: Additional Goal #1 - Progress: Goal set today  Visit Information  Last PT Received On: 12/19/11 Assistance Needed: +1    Subjective Data  Subjective: Pt received supine in bed with report "They took me to the bathroom earlier." Pt pleasant and agreeable to PT.   Prior Functioning  Home Living Lives With: Family (sons 30 and 68) Available Help at Discharge: Friend(s) (plans on going to friends house for first week) Type of Home: House Home Access: Stairs to enter Secretary/administrator of Steps: 4-5 Entrance Stairs-Rails: Can reach both Home Layout: Two level Alternate Level Stairs-Number of Steps: flight Alternate Level Stairs-Rails: Right Bathroom Shower/Tub: Engineer, manufacturing systems: Standard Bathroom Accessibility: Yes Home Adaptive Equipment: None Prior Function Level of Independence: Independent Able to Take Stairs?: Yes Driving: Yes Vocation: Full time  employment Communication Communication: No difficulties Dominant Hand: Right    Cognition  Overall Cognitive Status: Appears within functional limits for tasks assessed/performed Arousal/Alertness: Awake/alert Orientation Level: Oriented X4 / Intact Behavior During Session: WFL for tasks performed    Extremity/Trunk Assessment Right Upper Extremity Assessment RUE ROM/Strength/Tone: Within functional levels Left Upper Extremity Assessment LUE ROM/Strength/Tone: Within functional levels Right Lower Extremity Assessment RLE ROM/Strength/Tone: Within functional levels Left Lower Extremity Assessment LLE ROM/Strength/Tone: Within functional levels Trunk Assessment Trunk Assessment: Normal   Balance    End of Session PT - End of Session Equipment Utilized During Treatment: Gait belt Activity Tolerance: Patient tolerated treatment well Patient left: in chair;with call bell/phone within reach Nurse Communication: Mobility status (pt cleared to amb with family)  GP     Marcene Brawn 12/19/2011, 10:02 AM   Lewis Shock, PT, DPT Pager #: [phone] Office #: [phone]

## 2011-12-20 LAB — GLUCOSE, CAPILLARY

## 2011-12-20 MED ORDER — MAGNESIUM HYDROXIDE 400 MG/5ML PO SUSP
30.0000 mL | Freq: Once | ORAL | Status: AC
  Administered 2011-12-20: 30 mL via ORAL
  Filled 2011-12-20: qty 30

## 2011-12-20 MED ORDER — OXYCODONE-ACETAMINOPHEN 5-325 MG PO TABS: 1.0000 | ORAL_TABLET | ORAL | Status: DC | PRN

## 2011-12-20 MED ORDER — DIAZEPAM 5 MG PO TABS: 5.0000 mg | ORAL_TABLET | Freq: Four times a day (QID) | ORAL | Status: DC | PRN

## 2011-12-20 NOTE — Discharge Summary (Signed)
Physician Discharge Summary  Patient ID: Michelle Mueller MRN: [id number] DOB/AGE: [birth year] 46 y.o.  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 *    Discharged Condition: good  Hospital Course: Patient was admitted to undergo anterior lumbar compression arthrodesis using a Solis interbody spacer. She tolerated her surgery well. She's had good relief of back pain. Her abdomen is minimally uncomfortable and she is ambulating well.  Consults: vascular surgery  Significant Diagnostic Studies: MRI lumbar  Treatments: surgery: Anterior lumbar decompression L4-L5 arthrodesis with anterior interbody technique using solus implant. Dr. Tawanna Cooler early exposure.  Discharge Exam: Blood pressure 99/54, pulse 67, temperature 98.4 F (36.9 C), temperature source Oral, resp. rate 20, height 5\' 5"  (1.651 m), weight 100.8 kg (222 lb 3.6 oz), SpO2 96.00%. Motor function intact in both lower extremities abdominal incision dry clean.  Disposition: 01-Home or Self Care  Discharge Orders    Future Orders Please Complete By Expires   Diet - low sodium heart healthy      Increase activity slowly      Discharge instructions      Comments:   Okay to shower. Do not apply salves or appointments to incision. No heavy lifting with the upper extremities greater than 15 pounds. May resume driving when not requiring pain medication and patient feels comfortable with doing so.   Call MD for:  redness, tenderness, or signs of infection (pain, swelling, redness, odor or green/yellow discharge around incision site)      Call MD for:  severe uncontrolled pain      Call MD for:  temperature >100.4          Medication List     As of 12/20/2011  9:28 AM    TAKE these medications         ALEVE 220 MG tablet   Generic drug: naproxen sodium   Take 440 mg by mouth daily as needed. For pain      cyclobenzaprine 10 MG tablet   Commonly known as: FLEXERIL   Take 5 mg by mouth at bedtime.      diazepam 5 MG tablet   Commonly known as: VALIUM   Take 1 tablet (5 mg total) by mouth every 6 (six) hours as needed (Muscle spasm).      Glucosamine Sulfate 500 MG Tabs   Take 1 tablet by mouth daily.      HYDROcodone-acetaminophen 5-325 MG per tablet   Commonly known as: NORCO/VICODIN   Take 1 tablet by mouth 2 (two) times daily as needed. For pain      levonorgestrel 20 MCG/24HR IUD   Commonly known as: MIRENA   1 each by Intrauterine route once.      levothyroxine 25 MCG tablet   Commonly known as: SYNTHROID, LEVOTHROID   Take 25 mcg by mouth daily.      lisinopril 2.5 MG tablet   Commonly known as: PRINIVIL,ZESTRIL   Take 2.5 mg by mouth daily.      metFORMIN 500 MG 24 hr tablet   Commonly known as: GLUCOPHAGE-XR   Take 500 mg by mouth daily with breakfast.      nabumetone 750 MG tablet   Commonly known as: RELAFEN   Take 750 mg by mouth 2 (two) times daily.      OVER THE COUNTER MEDICATION  Take 2 tablets by mouth daily. Slim trim U 250 mg      oxyCODONE-acetaminophen 5-325 MG per tablet   Commonly known as: PERCOCET/ROXICET   Take 1-2 tablets by mouth every 4 (four) hours as needed for pain.      TURMERIC PO   Take 1 tablet by mouth 3 (three) times a week.      TYLENOL SINUS CONGESTION/PAIN PO   Take 2 tablets by mouth daily as needed. For sinus congestion         Signed: Franklin Clapsaddle Shela Commons 12/20/2011, 9:28 AM

## 2011-12-20 NOTE — Progress Notes (Signed)
Physical Therapy Treatment Note   12/20/11 1344  PT Visit Information  Last PT Received On 12/20/11  Assistance Needed +1  PT Time Calculation  PT Start Time 1344  PT Stop Time 1354  PT Time Calculation (min) 10 min  Subjective Data  Subjective Pt received standing at bedside preparing to get dressed for d/c.  Precautions  Precautions Back  Precaution Comments pt able to recall 3/3 back precautions  Restrictions  Weight Bearing Restrictions No  Cognition  Overall Cognitive Status Appears within functional limits for tasks assessed/performed  Arousal/Alertness Awake/alert  Orientation Level Oriented X4 / Intact  Behavior During Session [facility] for tasks performed  Bed Mobility  Bed Mobility Not assessed  Transfers  Transfers Not assessed  Ambulation/Gait  Ambulation/Gait Assistance 5: Supervision  Ambulation Distance (Feet) 300 Feet  Assistive device None  Ambulation/Gait Assistance Details guarded, no LOB  Gait Pattern WFL  Gait velocity cautious  Stairs Yes  Stairs Assistance 4: Min guard  Stair Management Technique One rail Left  Number of Stairs 12   PT - End of Session  Activity Tolerance Patient tolerated treatment well  Patient left (at bedside to get dressed)  Nurse Communication Mobility status  PT - Assessment/Plan  Comments on Treatment Session Pt has met all goals and has no further skilled PT needs at this time. Pt safe to d/c home with 24/7 supervision. PT signing off on patient. PLease re-consult if future skilled PT needs.  PT Plan Discharge plan remains appropriate  Acute Rehab PT Goals  PT Goal: Rolling Supine to Right Side - Progress Met  PT Goal: Supine/Side to Sit - Progress Met  PT Goal: Sit to Stand - Progress Met  PT Goal: Ambulate - Progress Met  PT Goal: Up/Down Stairs - Progress Met  PT General Charges  $$ ACUTE PT VISIT 1 Procedure  PT Treatments  $Gait Training 8-22 mins    Pain: 6/10   Lewis Shock, PT, DPT Pager #:  [phone] Office #: [phone]

## 2011-12-20 NOTE — Clinical Social Work Note (Signed)
Late enter 12/20/11 for 12/19/11 1500  CSW met with pt to discuss dc planning and current stressors. Pt states that she is doing well after surgery and has made plans to stay with a friend when leaving the hospital. Pt's grandparent passed away this last 04-Jul-2022 so her family has not been able to spend much time with her at the hospital or aid as planned in her recovery. Pt has a 44 year old son and a 7 year old son at home. Pt's 62 year old son is staying with his father while Pt recovers.  CSW offered emotional support and referral to therapist if Pt would like outpt follow up for ongoing family  issues. No further CSW needs at this time.   Frederico Hamman, LCSW [phone]

## 2011-12-20 NOTE — Progress Notes (Signed)
D/c instructions reviewed with pt, copy of instructions and scripts given to pt. Pt d/c'd via wheelchair with belongings with family and escorted by hospital volunteer.

## 2011-12-20 NOTE — Progress Notes (Signed)
Occupational Therapy Treatment Patient Details Name: Michelle Mueller MRN: [id number] DOB: 03-17-1965 Today's Date: 12/20/2011 Time: 1191-4782 OT Time Calculation (min): 16 min  OT Assessment / Plan / Recommendation Comments on Treatment Session Pt is knowledgeable in back precautions for ADL and IADL and available AE.  Pt will have good support upon d/c.  No further OT needs.    Follow Up Recommendations  No OT follow up    Barriers to Discharge       Equipment Recommendations  None recommended by OT    Recommendations for Other Services    Frequency     Plan Discharge plan remains appropriate    Precautions / Restrictions Precautions Precautions: Back Precaution Comments: reviewed back precautions related to IADL and ADL.   Pertinent Vitals/Pain Back soreness, RN aware, pt did not rate pain    ADL  Lower Body Dressing: Modified independent Where Assessed - Lower Body Dressing: Supported sit to stand;Unsupported sitting Toileting - Clothing Manipulation and Hygiene: Modified independent Where Assessed - Toileting Clothing Manipulation and Hygiene: Standing Transfers/Ambulation Related to ADLs: independently walking around unit ADL Comments: Pt now able to cross her foot over her opposite knee to donn socks and start pants over feet.  Plans to stand in shower stall.  Instructed in use of long sponge for feet and back.  Pt is aware of availability of tongs for toileting,  Also instructed in alternative technique to avoid bending.      OT Diagnosis:    OT Problem List:   OT Treatment Interventions:     OT Goals Acute Rehab OT Goals OT Goal Formulation: With patient Time For Goal Achievement: 12/26/11 ADL Goals Additional ADL Goal #1: Pt. will verbalized understanding of use and acquisition of reacher and toileting aid ADL Goal: Additional Goal #1 - Progress: Met Additional ADL Goal #2: Pt will be independent with back precautions with all activities ADL Goal: Additional  Goal #2 - Progress: Met  Visit Information  Last OT Received On: 12/20/11 Assistance Needed: +1    Subjective Data      Prior Functioning       Cognition  Overall Cognitive Status: Appears within functional limits for tasks assessed/performed Arousal/Alertness: Awake/alert Orientation Level: Oriented X4 / Intact Behavior During Session: [facility] for tasks performed    Mobility  Shoulder Instructions Bed Mobility Bed Mobility: Not assessed Transfers Sit to Stand: 6: Modified independent (Device/Increase time);With upper extremity assist;From chair/3-in-1 Stand to Sit: 6: Modified independent (Device/Increase time);To chair/3-in-1       Exercises      Balance     End of Session OT - End of Session Activity Tolerance: Patient tolerated treatment well Patient left: in bed;with nursing in room  GO     Evern Bio 12/20/2011, 10:48 AM [phone]

## 2011-12-21 MED FILL — Sodium Chloride IV Soln 0.9%: INTRAVENOUS | Qty: 1000 | Status: AC

## 2011-12-21 MED FILL — Heparin Sodium (Porcine) Inj 1000 Unit/ML: INTRAMUSCULAR | Qty: 30 | Status: AC

## 2011-12-21 MED FILL — Sodium Chloride Irrigation Soln 0.9%: Qty: 3000 | Status: AC

## 2012-01-31 LAB — HM MAMMOGRAPHY

## 2012-04-04 ENCOUNTER — Other Ambulatory Visit: Payer: Self-pay | Admitting: Endocrinology

## 2012-04-04 DIAGNOSIS — E041 Nontoxic single thyroid nodule: Secondary | ICD-10-CM

## 2012-05-01 ENCOUNTER — Other Ambulatory Visit: Payer: Self-pay | Admitting: Neurological Surgery

## 2012-05-01 ENCOUNTER — Ambulatory Visit: Payer: BC Managed Care – PPO | Admitting: Obstetrics & Gynecology

## 2012-05-01 DIAGNOSIS — M48061 Spinal stenosis, lumbar region without neurogenic claudication: Secondary | ICD-10-CM

## 2012-05-06 ENCOUNTER — Ambulatory Visit
Admission: RE | Admit: 2012-05-06 | Discharge: 2012-05-06 | Disposition: A | Discharge status: Home / Self Care | Payer: BC Managed Care – PPO | Source: Ambulatory Visit | Attending: Neurological Surgery | Admitting: Neurological Surgery

## 2012-05-06 DIAGNOSIS — M48061 Spinal stenosis, lumbar region without neurogenic claudication: Secondary | ICD-10-CM

## 2012-05-14 ENCOUNTER — Other Ambulatory Visit: Payer: Self-pay | Admitting: Neurological Surgery

## 2012-05-16 ENCOUNTER — Ambulatory Visit (INDEPENDENT_AMBULATORY_CARE_PROVIDER_SITE_OTHER): Payer: BC Managed Care – PPO | Admitting: Obstetrics & Gynecology

## 2012-05-16 ENCOUNTER — Encounter: Payer: Self-pay | Admitting: Obstetrics & Gynecology

## 2012-05-16 VITALS — BP 138/87 | HR 117 | Resp 16 | Ht 64.0 in | Wt 223.0 lb

## 2012-05-16 DIAGNOSIS — IMO0001 Reserved for inherently not codable concepts without codable children: Secondary | ICD-10-CM

## 2012-05-16 DIAGNOSIS — Z30433 Encounter for removal and reinsertion of intrauterine contraceptive device: Secondary | ICD-10-CM

## 2012-05-16 DIAGNOSIS — Z01812 Encounter for preprocedural laboratory examination: Secondary | ICD-10-CM

## 2012-05-16 LAB — POCT URINE PREGNANCY: Preg Test, Ur: NEGATIVE

## 2012-05-16 MED ORDER — LEVONORGESTREL 20 MCG/24HR IU IUD
INTRAUTERINE_SYSTEM | Freq: Once | INTRAUTERINE | Status: AC
  Administered 2012-05-16: 16:00:00 via INTRAUTERINE

## 2012-05-16 NOTE — Progress Notes (Signed)
Pt due for IUD removal and insertion (Mirena).  Anteverted uterus.  No adnexal masses  GYNECOLOGY CLINIC PROCEDURE NOTE  [name] is a 47 y.o. G2P2 here for Mirena IUD removal and reinsertion. No GYN concerns.  Last pap smear was on 2012 and was normal and HPV neg.  UPT neg  IUD Removal and Reinsertion  Patient identified, informed consent performed. Discussed risks of irregular bleeding, cramping, infection, malpositioning or misplacement of the IUD outside the uterus which may require further procedures. Time out was performed. Speculum placed in the vagina. Cervix visualized. Cleaned with Betadine x 2. Grasped anteriorly with a single tooth tenaculum. The strings of the IUD were grasped and pulled using ring forceps. The IUD was successfully removed in its entirety. Uterus sounded to 9 cm. Mirena IUD placed per manufacturer's recommendations. Strings trimmed to 2 cm. Tenaculum was removed, good hemostasis noted. Patient tolerated procedure well. Patient was given post-procedure instructions.  Patient was also asked to check IUD strings periodically and follow up in 4 weeks for IUD check.

## 2012-06-06 ENCOUNTER — Encounter (HOSPITAL_COMMUNITY): Payer: Self-pay | Admitting: Pharmacy Technician

## 2012-06-06 NOTE — Pre-Procedure Instructions (Signed)
Michelle Mueller  06/06/2012   Your procedure is scheduled on:  Tuesday, May 20th  Report to [facility] at 8:30 AM.             Bonita Quin will come into Entrance A, follow the signs to the [facility] and take those to the 3rd Floor)   Call this number if you have problems the morning of surgery: [phone]   Remember:   Do not eat food or drink liquids after midnight Monday.   Take these medicines the morning of surgery with A SIP OF WATER: Flexeril, Norco, Levothyroxine   Do not wear jewelry, make-up or nail polish.  Do not wear lotions, powders, or perfumes. You may NOT wear deodorant.  Do not shave underarms & legs 48 hours prior to surgery.    Do not bring valuables to the hospital.  Contacts, dentures or bridgework may not be worn into surgery.   Leave suitcase in the car. After surgery it may be brought to your room.  For patients admitted to the hospital, checkout time is 11:00 AM the day of discharge.   Name and phone number of your driver:   Special Instructions: Shower using CHG 2 nights before surgery and the night before surgery.  If you shower the day of surgery use CHG.  Use special wash - you have one bottle of CHG for all showers.  You should use approximately 1/3 of the bottle for each shower.   Please read over the following fact sheets that you were given: Pain Booklet, Coughing and Deep Breathing, Blood Transfusion Information, MRSA Information and Surgical Site Infection Prevention

## 2012-06-07 ENCOUNTER — Encounter (HOSPITAL_COMMUNITY): Payer: Self-pay

## 2012-06-07 ENCOUNTER — Encounter (HOSPITAL_COMMUNITY)
Admission: RE | Admit: 2012-06-07 | Discharge: 2012-06-07 | Disposition: A | Discharge status: Home / Self Care | Payer: BC Managed Care – PPO | Source: Ambulatory Visit | Attending: Neurological Surgery | Admitting: Neurological Surgery

## 2012-06-07 DIAGNOSIS — Z01812 Encounter for preprocedural laboratory examination: Secondary | ICD-10-CM | POA: Insufficient documentation

## 2012-06-07 DIAGNOSIS — Z01818 Encounter for other preprocedural examination: Secondary | ICD-10-CM | POA: Insufficient documentation

## 2012-06-07 HISTORY — DX: Nontoxic goiter, unspecified: E04.9

## 2012-06-07 LAB — CBC
HCT: 38.2 % (ref 36.0–46.0)
Hemoglobin: 12.8 g/dL (ref 12.0–15.0)
MCH: 27.5 pg (ref 26.0–34.0)
MCHC: 33.5 g/dL (ref 30.0–36.0)
RBC: 4.66 MIL/uL (ref 3.87–5.11)

## 2012-06-07 LAB — BASIC METABOLIC PANEL
BUN: 15 mg/dL (ref 6–23)
CO2: 28 mEq/L (ref 19–32)
Calcium: 8.9 mg/dL (ref 8.4–10.5)
Glucose, Bld: 86 mg/dL (ref 70–99)
Potassium: 4.6 mEq/L (ref 3.5–5.1)
Sodium: 139 mEq/L (ref 135–145)

## 2012-06-07 LAB — TYPE AND SCREEN
ABO/RH(D): O POS
Antibody Screen: NEGATIVE

## 2012-06-07 LAB — SURGICAL PCR SCREEN: MRSA, PCR: NEGATIVE

## 2012-06-07 NOTE — Progress Notes (Signed)
PCP--DR. BALAN...PLACED PT ON METFORMIN "PRE DIABETIC CONDITIONS"...the patient DOESN'T HAVE EQUIPMENT TO CHECK SUGARS... DA

## 2012-06-17 MED ORDER — CEFAZOLIN SODIUM-DEXTROSE 2-3 GM-% IV SOLR
2.0000 g | INTRAVENOUS | Status: AC
  Administered 2012-06-18: 2 g via INTRAVENOUS
  Administered 2012-06-18: 1 g via INTRAVENOUS
  Filled 2012-06-17: qty 50

## 2012-06-18 ENCOUNTER — Ambulatory Visit (HOSPITAL_COMMUNITY): Payer: BC Managed Care – PPO | Admitting: Certified Registered Nurse Anesthetist

## 2012-06-18 ENCOUNTER — Encounter (HOSPITAL_COMMUNITY): Payer: Self-pay | Admitting: *Deleted

## 2012-06-18 ENCOUNTER — Encounter (HOSPITAL_COMMUNITY)
Admission: RE | Disposition: A | Discharge status: Home / Self Care | Payer: Self-pay | Source: Ambulatory Visit | Attending: Neurological Surgery

## 2012-06-18 ENCOUNTER — Ambulatory Visit (HOSPITAL_COMMUNITY): Payer: BC Managed Care – PPO

## 2012-06-18 ENCOUNTER — Encounter (HOSPITAL_COMMUNITY): Payer: Self-pay | Admitting: Certified Registered Nurse Anesthetist

## 2012-06-18 ENCOUNTER — Inpatient Hospital Stay (HOSPITAL_COMMUNITY)
Admission: RE | Admit: 2012-06-18 | Discharge: 2012-06-22 | DRG: 756 | Disposition: A | Discharge status: Home / Self Care | Payer: BC Managed Care – PPO | Source: Ambulatory Visit | Attending: Neurological Surgery | Admitting: Neurological Surgery

## 2012-06-18 DIAGNOSIS — Z981 Arthrodesis status: Secondary | ICD-10-CM

## 2012-06-18 DIAGNOSIS — J45909 Unspecified asthma, uncomplicated: Secondary | ICD-10-CM | POA: Diagnosis present

## 2012-06-18 DIAGNOSIS — I10 Essential (primary) hypertension: Secondary | ICD-10-CM | POA: Diagnosis present

## 2012-06-18 DIAGNOSIS — E039 Hypothyroidism, unspecified: Secondary | ICD-10-CM | POA: Diagnosis present

## 2012-06-18 DIAGNOSIS — Y834 Other reconstructive surgery as the cause of abnormal reaction of the patient, or of later complication, without mention of misadventure at the time of the procedure: Secondary | ICD-10-CM | POA: Diagnosis present

## 2012-06-18 DIAGNOSIS — S32009K Unspecified fracture of unspecified lumbar vertebra, subsequent encounter for fracture with nonunion: Secondary | ICD-10-CM

## 2012-06-18 DIAGNOSIS — T84498A Other mechanical complication of other internal orthopedic devices, implants and grafts, initial encounter: Principal | ICD-10-CM | POA: Diagnosis present

## 2012-06-18 DIAGNOSIS — E119 Type 2 diabetes mellitus without complications: Secondary | ICD-10-CM | POA: Diagnosis present

## 2012-06-18 DIAGNOSIS — Z794 Long term (current) use of insulin: Secondary | ICD-10-CM

## 2012-06-18 LAB — GLUCOSE, CAPILLARY
Glucose-Capillary: 90 mg/dL (ref 70–99)
Glucose-Capillary: 78 mg/dL (ref 70–99)
Glucose-Capillary: 79 mg/dL (ref 70–99)
Glucose-Capillary: 103 mg/dL — ABNORMAL HIGH (ref 70–99)

## 2012-06-18 SURGERY — POSTERIOR LUMBAR FUSION 1 LEVEL
Anesthesia: General | Site: Back | Wound class: Clean

## 2012-06-18 MED ORDER — SODIUM CHLORIDE 0.9 % IV SOLN: 250.0000 mL | INTRAVENOUS | Status: DC

## 2012-06-18 MED ORDER — PROPOFOL 10 MG/ML IV BOLUS
INTRAVENOUS | Status: DC | PRN
Start: 2012-06-18 — End: 2012-06-18
  Administered 2012-06-18: 200 mg via INTRAVENOUS

## 2012-06-18 MED ORDER — GLYCOPYRROLATE 0.2 MG/ML IJ SOLN
INTRAMUSCULAR | Status: DC | PRN
  Administered 2012-06-18: 0.6 mg via INTRAVENOUS

## 2012-06-18 MED ORDER — ARTIFICIAL TEARS OP OINT
TOPICAL_OINTMENT | OPHTHALMIC | Status: DC | PRN
  Administered 2012-06-18: 1 via OPHTHALMIC

## 2012-06-18 MED ORDER — HYDROMORPHONE HCL PF 1 MG/ML IJ SOLN
INTRAMUSCULAR | Status: DC | PRN
  Administered 2012-06-18 (×2): 0.5 mg via INTRAVENOUS

## 2012-06-18 MED ORDER — HYDROCODONE-ACETAMINOPHEN 5-325 MG PO TABS
1.0000 | ORAL_TABLET | Freq: Every day | ORAL | Status: DC | PRN
  Filled 2012-06-18: qty 2

## 2012-06-18 MED ORDER — FENTANYL CITRATE 0.05 MG/ML IJ SOLN
INTRAMUSCULAR | Status: DC | PRN
  Administered 2012-06-18 (×4): 50 ug via INTRAVENOUS
  Administered 2012-06-18: 100 ug via INTRAVENOUS
  Administered 2012-06-18: 50 ug via INTRAVENOUS

## 2012-06-18 MED ORDER — PHENOL 1.4 % MT LIQD: 1.0000 | OROMUCOSAL | Status: DC | PRN

## 2012-06-18 MED ORDER — PHENYLEPHRINE HCL 10 MG/ML IJ SOLN
INTRAMUSCULAR | Status: DC | PRN
Start: 2012-06-18 — End: 2012-06-18
  Administered 2012-06-18: 40 ug via INTRAVENOUS

## 2012-06-18 MED ORDER — THROMBIN 20000 UNITS EX SOLR
CUTANEOUS | Status: DC | PRN
  Administered 2012-06-18: 20000 [IU] via TOPICAL

## 2012-06-18 MED ORDER — METHOCARBAMOL 500 MG PO TABS
500.0000 mg | ORAL_TABLET | Freq: Four times a day (QID) | ORAL | Status: DC | PRN
  Administered 2012-06-20 – 2012-06-22 (×7): 500 mg via ORAL
  Filled 2012-06-18 (×7): qty 1

## 2012-06-18 MED ORDER — LIDOCAINE HCL 4 % MT SOLN
OROMUCOSAL | Status: DC | PRN
  Administered 2012-06-18: 4 mL via TOPICAL

## 2012-06-18 MED ORDER — WHITE PETROLATUM GEL
Status: AC
  Administered 2012-06-18: 23:00:00
  Filled 2012-06-18: qty 5

## 2012-06-18 MED ORDER — LISINOPRIL 2.5 MG PO TABS
2.5000 mg | ORAL_TABLET | Freq: Every day | ORAL | Status: DC
  Administered 2012-06-18 – 2012-06-22 (×5): 2.5 mg via ORAL
  Filled 2012-06-18 (×5): qty 1

## 2012-06-18 MED ORDER — HYDROMORPHONE HCL PF 1 MG/ML IJ SOLN
INTRAMUSCULAR | Status: AC
  Filled 2012-06-18: qty 1

## 2012-06-18 MED ORDER — ROCURONIUM BROMIDE 100 MG/10ML IV SOLN
INTRAVENOUS | Status: DC | PRN
  Administered 2012-06-18: 20 mg via INTRAVENOUS
  Administered 2012-06-18: 30 mg via INTRAVENOUS
  Administered 2012-06-18: 20 mg via INTRAVENOUS
  Administered 2012-06-18: 10 mg via INTRAVENOUS
  Administered 2012-06-18: 50 mg via INTRAVENOUS
  Administered 2012-06-18: 10 mg via INTRAVENOUS

## 2012-06-18 MED ORDER — OXYCODONE HCL 5 MG/5ML PO SOLN: 5.0000 mg | Freq: Once | ORAL | Status: DC | PRN

## 2012-06-18 MED ORDER — OXYCODONE HCL 5 MG PO TABS: 5.0000 mg | ORAL_TABLET | Freq: Once | ORAL | Status: DC | PRN

## 2012-06-18 MED ORDER — 0.9 % SODIUM CHLORIDE (POUR BTL) OPTIME
TOPICAL | Status: DC | PRN
  Administered 2012-06-18: 1000 mL

## 2012-06-18 MED ORDER — METFORMIN HCL ER 500 MG PO TB24
500.0000 mg | ORAL_TABLET | Freq: Every evening | ORAL | Status: DC
  Administered 2012-06-18 – 2012-06-21 (×4): 500 mg via ORAL
  Filled 2012-06-18 (×5): qty 1

## 2012-06-18 MED ORDER — CEFAZOLIN SODIUM 1-5 GM-% IV SOLN
1.0000 g | Freq: Three times a day (TID) | INTRAVENOUS | Status: AC
  Administered 2012-06-19 (×2): 1 g via INTRAVENOUS
  Filled 2012-06-18 (×2): qty 50

## 2012-06-18 MED ORDER — ACETAMINOPHEN 10 MG/ML IV SOLN
INTRAVENOUS | Status: AC
  Filled 2012-06-18: qty 100

## 2012-06-18 MED ORDER — HYDROMORPHONE HCL PF 1 MG/ML IJ SOLN
0.5000 mg | INTRAMUSCULAR | Status: DC | PRN
  Administered 2012-06-19 – 2012-06-20 (×6): 1 mg via INTRAVENOUS
  Filled 2012-06-18 (×6): qty 1

## 2012-06-18 MED ORDER — SODIUM CHLORIDE 0.9 % IJ SOLN: 3.0000 mL | INTRAMUSCULAR | Status: DC | PRN

## 2012-06-18 MED ORDER — LIDOCAINE HCL (CARDIAC) 20 MG/ML IV SOLN
INTRAVENOUS | Status: DC | PRN
  Administered 2012-06-18: 70 mg via INTRAVENOUS

## 2012-06-18 MED ORDER — DEXAMETHASONE SODIUM PHOSPHATE 10 MG/ML IJ SOLN
INTRAMUSCULAR | Status: DC | PRN
  Administered 2012-06-18: 8 mg via INTRAVENOUS

## 2012-06-18 MED ORDER — BUPIVACAINE HCL 0.5 % IJ SOLN
INTRAMUSCULAR | Status: DC | PRN
  Administered 2012-06-18: 5 mL

## 2012-06-18 MED ORDER — CEFAZOLIN SODIUM 1-5 GM-% IV SOLN
INTRAVENOUS | Status: AC
  Filled 2012-06-18: qty 50

## 2012-06-18 MED ORDER — LACTATED RINGERS IV SOLN
INTRAVENOUS | Status: DC | PRN
  Administered 2012-06-18 (×2): via INTRAVENOUS

## 2012-06-18 MED ORDER — LORATADINE 10 MG PO TABS
10.0000 mg | ORAL_TABLET | Freq: Every day | ORAL | Status: DC
  Administered 2012-06-18 – 2012-06-22 (×5): 10 mg via ORAL
  Filled 2012-06-18 (×5): qty 1

## 2012-06-18 MED ORDER — ACETAMINOPHEN 650 MG RE SUPP: 650.0000 mg | RECTAL | Status: DC | PRN

## 2012-06-18 MED ORDER — NEOSTIGMINE METHYLSULFATE 1 MG/ML IJ SOLN
INTRAMUSCULAR | Status: DC | PRN
  Administered 2012-06-18: 4 mg via INTRAVENOUS

## 2012-06-18 MED ORDER — MENTHOL 3 MG MT LOZG: 1.0000 | LOZENGE | OROMUCOSAL | Status: DC | PRN

## 2012-06-18 MED ORDER — PROMETHAZINE HCL 25 MG/ML IJ SOLN: 6.2500 mg | INTRAMUSCULAR | Status: DC | PRN

## 2012-06-18 MED ORDER — LEVOTHYROXINE SODIUM 25 MCG PO TABS
25.0000 ug | ORAL_TABLET | Freq: Every day | ORAL | Status: DC
  Administered 2012-06-19 – 2012-06-22 (×4): 25 ug via ORAL
  Filled 2012-06-18 (×5): qty 1

## 2012-06-18 MED ORDER — ACETAMINOPHEN 10 MG/ML IV SOLN
INTRAVENOUS | Status: DC | PRN
  Administered 2012-06-18: 1000 mg via INTRAVENOUS

## 2012-06-18 MED ORDER — SODIUM CHLORIDE 0.9 % IR SOLN
Status: DC | PRN
  Administered 2012-06-18: 15:00:00

## 2012-06-18 MED ORDER — ONDANSETRON HCL 4 MG/2ML IJ SOLN
INTRAMUSCULAR | Status: DC | PRN
  Administered 2012-06-18: 4 mg via INTRAVENOUS

## 2012-06-18 MED ORDER — SODIUM CHLORIDE 0.9 % IJ SOLN
3.0000 mL | Freq: Two times a day (BID) | INTRAMUSCULAR | Status: DC
  Administered 2012-06-18 – 2012-06-22 (×8): 3 mL via INTRAVENOUS

## 2012-06-18 MED ORDER — OXYCODONE HCL 10 MG PO TABS: 10.0000 mg | ORAL_TABLET | Freq: Four times a day (QID) | ORAL | Status: DC | PRN

## 2012-06-18 MED ORDER — LIDOCAINE-EPINEPHRINE 1 %-1:100000 IJ SOLN
INTRAMUSCULAR | Status: DC | PRN
  Administered 2012-06-18: 5 mL via INTRADERMAL

## 2012-06-18 MED ORDER — CYCLOBENZAPRINE HCL 5 MG PO TABS
5.0000 mg | ORAL_TABLET | Freq: Every day | ORAL | Status: DC
  Administered 2012-06-18 – 2012-06-19 (×2): 5 mg via ORAL
  Filled 2012-06-18 (×5): qty 1

## 2012-06-18 MED ORDER — BACITRACIN 50000 UNITS IM SOLR
INTRAMUSCULAR | Status: AC
  Filled 2012-06-18: qty 1

## 2012-06-18 MED ORDER — ACETAMINOPHEN 325 MG PO TABS
650.0000 mg | ORAL_TABLET | ORAL | Status: DC | PRN
  Administered 2012-06-20 – 2012-06-22 (×3): 650 mg via ORAL
  Filled 2012-06-18 (×3): qty 2

## 2012-06-18 MED ORDER — ALUM & MAG HYDROXIDE-SIMETH 200-200-20 MG/5ML PO SUSP: 30.0000 mL | Freq: Four times a day (QID) | ORAL | Status: DC | PRN

## 2012-06-18 MED ORDER — ONDANSETRON HCL 4 MG/2ML IJ SOLN: 4.0000 mg | INTRAMUSCULAR | Status: DC | PRN

## 2012-06-18 MED ORDER — MIDAZOLAM HCL 5 MG/5ML IJ SOLN
INTRAMUSCULAR | Status: DC | PRN
  Administered 2012-06-18: 2 mg via INTRAVENOUS

## 2012-06-18 MED ORDER — OXYCODONE HCL 5 MG PO TABS
10.0000 mg | ORAL_TABLET | Freq: Four times a day (QID) | ORAL | Status: DC | PRN
  Administered 2012-06-19 – 2012-06-22 (×12): 10 mg via ORAL
  Filled 2012-06-18 (×12): qty 2

## 2012-06-18 MED ORDER — SODIUM CHLORIDE 0.9 % IV SOLN
INTRAVENOUS | Status: AC
  Administered 2012-06-18: 18:00:00 via INTRAVENOUS
  Filled 2012-06-18: qty 500

## 2012-06-18 MED ORDER — SODIUM CHLORIDE 0.9 % IV SOLN
INTRAVENOUS | Status: DC
  Administered 2012-06-18: 22:00:00 via INTRAVENOUS

## 2012-06-18 MED ORDER — LACTATED RINGERS IV SOLN
INTRAVENOUS | Status: DC
  Administered 2012-06-18: 13:00:00 via INTRAVENOUS

## 2012-06-18 MED ORDER — HYDROMORPHONE HCL PF 1 MG/ML IJ SOLN
0.2500 mg | INTRAMUSCULAR | Status: DC | PRN
  Administered 2012-06-18 (×6): 0.5 mg via INTRAVENOUS

## 2012-06-18 MED ORDER — METHOCARBAMOL 100 MG/ML IJ SOLN
500.0000 mg | Freq: Four times a day (QID) | INTRAVENOUS | Status: DC | PRN
  Filled 2012-06-18: qty 5

## 2012-06-18 MED ORDER — HEMOSTATIC AGENTS (NO CHARGE) OPTIME
TOPICAL | Status: DC | PRN
  Administered 2012-06-18: 1 via TOPICAL

## 2012-06-18 SURGICAL SUPPLY — 55 items
BAG DECANTER FOR FLEXI CONT (MISCELLANEOUS) ×2 IMPLANT
BANDAGE GAUZE ELAST BULKY 4 IN (GAUZE/BANDAGES/DRESSINGS) ×2 IMPLANT
BLADE SURG ROTATE 9660 (MISCELLANEOUS) IMPLANT
BUR MATCHSTICK NEURO 3.0 LAGG (BURR) ×2 IMPLANT
CANISTER SUCTION 2500CC (MISCELLANEOUS) ×2 IMPLANT
CLOTH BEACON ORANGE TIMEOUT ST (SAFETY) ×2 IMPLANT
CONT SPEC 4OZ CLIKSEAL STRL BL (MISCELLANEOUS) ×4 IMPLANT
COVER BACK TABLE 24X17X13 BIG (DRAPES) IMPLANT
COVER TABLE BACK 60X90 (DRAPES) ×2 IMPLANT
DECANTER SPIKE VIAL GLASS SM (MISCELLANEOUS) ×2 IMPLANT
DERMABOND ADVANCED (GAUZE/BANDAGES/DRESSINGS) ×1
DERMABOND ADVANCED .7 DNX12 (GAUZE/BANDAGES/DRESSINGS) ×1 IMPLANT
DRAPE C-ARM 42X72 X-RAY (DRAPES) ×2 IMPLANT
DRAPE LAPAROTOMY 100X72X124 (DRAPES) ×2 IMPLANT
DRAPE POUCH INSTRU U-SHP 10X18 (DRAPES) ×2 IMPLANT
DRAPE PROXIMA HALF (DRAPES) ×2 IMPLANT
DURAPREP 26ML APPLICATOR (WOUND CARE) ×2 IMPLANT
ELECT REM PT RETURN 9FT ADLT (ELECTROSURGICAL) ×2
ELECTRODE REM PT RTRN 9FT ADLT (ELECTROSURGICAL) ×1 IMPLANT
GAUZE SPONGE 4X4 16PLY XRAY LF (GAUZE/BANDAGES/DRESSINGS) ×2 IMPLANT
GLOVE BIO SURGEON STRL SZ 6.5 (GLOVE) ×6 IMPLANT
GLOVE BIOGEL PI IND STRL 6.5 (GLOVE) ×1 IMPLANT
GLOVE BIOGEL PI IND STRL 7.5 (GLOVE) ×1 IMPLANT
GLOVE BIOGEL PI IND STRL 8.5 (GLOVE) ×2 IMPLANT
GLOVE BIOGEL PI INDICATOR 6.5 (GLOVE) ×1
GLOVE BIOGEL PI INDICATOR 7.5 (GLOVE) ×1
GLOVE BIOGEL PI INDICATOR 8.5 (GLOVE) ×2
GLOVE ECLIPSE 7.5 STRL STRAW (GLOVE) ×6 IMPLANT
GLOVE ECLIPSE 8.5 STRL (GLOVE) ×4 IMPLANT
GLOVE SURG SS PI 8.0 STRL IVOR (GLOVE) ×2 IMPLANT
KIT BASIN OR (CUSTOM PROCEDURE TRAY) ×2 IMPLANT
KIT ROOM TURNOVER OR (KITS) ×2 IMPLANT
NEEDLE HYPO 22GX1.5 SAFETY (NEEDLE) ×2 IMPLANT
NS IRRIG 1000ML POUR BTL (IV SOLUTION) ×2 IMPLANT
PACK FOAM VITOSS 10CC (Orthopedic Implant) ×2 IMPLANT
PACK LAMINECTOMY NEURO (CUSTOM PROCEDURE TRAY) ×2 IMPLANT
PAD ARMBOARD 7.5X6 YLW CONV (MISCELLANEOUS) ×6 IMPLANT
ROD TI PRECONT ILLICO 5.5X3.5 (Rod) ×4 IMPLANT
SCREW CANN PA ILLICO 6.5X35 (Screw) ×4 IMPLANT
SCREW CANN PA ILLICO 6.5X40 (Screw) ×4 IMPLANT
SPONGE GAUZE 4X4 12PLY (GAUZE/BANDAGES/DRESSINGS) ×4 IMPLANT
SPONGE LAP 4X18 X RAY DECT (DISPOSABLE) IMPLANT
SPONGE SURGIFOAM ABS GEL 100 (HEMOSTASIS) ×2 IMPLANT
SUT VIC AB 1 CT1 18XBRD ANBCTR (SUTURE) ×2 IMPLANT
SUT VIC AB 1 CT1 8-18 (SUTURE) ×2
SUT VIC AB 2-0 CP2 18 (SUTURE) ×2 IMPLANT
SUT VIC AB 3-0 SH 8-18 (SUTURE) ×4 IMPLANT
SYR 20ML ECCENTRIC (SYRINGE) ×2 IMPLANT
Set Screw (Neuro Prosthesis/Implant) ×8 IMPLANT
TAPE CLOTH SURG 4X10 WHT LF (GAUZE/BANDAGES/DRESSINGS) ×2 IMPLANT
TIP TROCAR NITINOL ILLICO 18 (INSTRUMENTS) ×2 IMPLANT
TOWEL OR 17X26 10 PK STRL BLUE (TOWEL DISPOSABLE) ×2 IMPLANT
TRAP SPECIMEN MUCOUS 40CC (MISCELLANEOUS) ×2 IMPLANT
TRAY FOLEY CATH 14FRSI W/METER (CATHETERS) ×2 IMPLANT
WATER STERILE IRR 1000ML POUR (IV SOLUTION) ×2 IMPLANT

## 2012-06-18 NOTE — Anesthesia Postprocedure Evaluation (Signed)
Anesthesia Post Note  Patient: Michelle Mueller  Procedure(s) Performed: Procedure(s) (LRB): L4-5 Posterior supplemental fixation/fusion with Iliac Crest Bonegraft (N/A)  Anesthesia type: general  Patient location: PACU  Post pain: Pain level controlled  Post assessment: Patient's Cardiovascular Status Stable  Last Vitals:  Filed Vitals:   06/18/12 1930  BP:   Pulse: 95  Temp:   Resp: 10    Post vital signs: Reviewed and stable  Level of consciousness: sedated  Complications: No apparent anesthesia complications

## 2012-06-18 NOTE — Progress Notes (Signed)
Notified Dr. Gypsy Balsam of CBG 78 patient asymptomatic.  No new orders at this time, will continue to monitor.

## 2012-06-18 NOTE — Preoperative (Signed)
Beta Blockers   Reason not to administer Beta Blockers:Not Applicable 

## 2012-06-18 NOTE — Anesthesia Postprocedure Evaluation (Deleted)
Anesthesia Post Note  Patient: Michelle Mueller  Procedure(s) Performed: Procedure(s) (LRB): L4-5 Posterior supplemental fixation/fusion with Iliac Crest Bonegraft (N/A)  Anesthesia type: general  Patient location: PACU  Post pain: Pain level controlled  Post assessment: Patient's Cardiovascular Status Stable  Last Vitals:  Filed Vitals:   06/18/12 0807  BP: 119/81  Pulse: 80  Temp: 36.9 C  Resp: 20    Post vital signs: Reviewed and stable  Level of consciousness: sedated  Complications: No apparent anesthesia complications

## 2012-06-18 NOTE — Anesthesia Preprocedure Evaluation (Addendum)
Anesthesia Evaluation  Patient identified by MRN, date of birth, ID band Patient awake    Reviewed: Allergy & Precautions, H&P , NPO status , Patient's Chart, lab work & pertinent test results  History of Anesthesia Complications (+) PONV  Airway Mallampati: II TM Distance: >3 FB Neck ROM: full    Dental  (+) Teeth Intact and Dental Advisory Given   Pulmonary asthma ,  breath sounds clear to auscultation        Cardiovascular hypertension, Pt. on medications Rhythm:Regular Rate:Normal     Neuro/Psych negative neurological ROS  negative psych ROS   GI/Hepatic Neg liver ROS, hiatal hernia,   Endo/Other  diabetes, Well Controlled, Type 2, Oral Hypoglycemic AgentsHypothyroidism obese  Renal/GU negative Renal ROS     Musculoskeletal negative musculoskeletal ROS (+)   Abdominal (+) + obese,   Peds  Hematology negative hematology ROS (+)   Anesthesia Other Findings   Reproductive/Obstetrics negative OB ROS                          Anesthesia Physical Anesthesia Plan  ASA: III  Anesthesia Plan: General   Post-op Pain Management:    Induction: Intravenous  Airway Management Planned: Oral ETT  Additional Equipment:   Intra-op Plan:   Post-operative Plan: Extubation in OR  Informed Consent: I have reviewed the patients History and Physical, chart, labs and discussed the procedure including the risks, benefits and alternatives for the proposed anesthesia with the patient or authorized representative who has indicated his/her understanding and acceptance.   Dental advisory given  Plan Discussed with: CRNA and Surgeon  Anesthesia Plan Comments:         Anesthesia Quick Evaluation

## 2012-06-18 NOTE — Progress Notes (Signed)
Patient ID: Michelle Mueller, female   DOB: March 10, 1965, 47 y.o.   MRN: [id number] Awake alert stable post op.

## 2012-06-18 NOTE — Anesthesia Procedure Notes (Signed)
Procedure Name: Intubation Date/Time: 06/18/2012 1:56 PM Performed by: Orvilla Fus A Pre-anesthesia Checklist: Patient identified, Timeout performed, Emergency Drugs available, Suction available and Patient being monitored Patient Re-evaluated:Patient Re-evaluated prior to inductionOxygen Delivery Method: Circle system utilized Preoxygenation: Pre-oxygenation with 100% oxygen Intubation Type: IV induction Ventilation: Mask ventilation without difficulty Laryngoscope Size: Mac and 3 Grade View: Grade I Tube type: Oral Tube size: 7.0 mm Airway Equipment and Method: Stylet and LTA kit utilized Placement Confirmation: ETT inserted through vocal cords under direct vision,  breath sounds checked- equal and bilateral and positive ETCO2 Secured at: 21 cm Tube secured with: Tape Dental Injury: Teeth and Oropharynx as per pre-operative assessment

## 2012-06-18 NOTE — Transfer of Care (Signed)
Immediate Anesthesia Transfer of Care Note  Patient: Michelle Mueller  Procedure(s) Performed: Procedure(s) with comments: L4-5 Posterior supplemental fixation/fusion with Iliac Crest Bonegraft (N/A) - Lumbar Four-Five Posterior Supplemental Fixation/Fusion with Iliac Crest Bonegraft from left  Patient Location: PACU  Anesthesia Type:General  Level of Consciousness: awake, alert  and oriented  Airway & Oxygen Therapy: Patient Spontanous Breathing  Post-op Assessment: Report given to PACU RN  Post vital signs: stable  Complications: No apparent anesthesia complications

## 2012-06-18 NOTE — H&P (Signed)
Michelle Mueller is an 47 y.o. female.   Chief Complaint: Persistent back pain status post a left HPI: Michelle Mueller is a 47 year old individual who underwent an anterior lumbar interbody arthrodesis in November of 2013. She initially did well for several months time. If she progressively became more active she noted recurrence of pain in the back buttock and both lower extremities. Though her arthrodesis appear to be healing well a recent CT scan demonstrates that she has a pseudoarthrosis at L4-L5, she also has some moderate facet hypertrophy that has progressed since her previous study. After careful consideration of her options and failing use of an external fusion stimulator she is now taken to the operating room to undergo posterior supplemental fixation.  Past Medical History  Diagnosis Date  . Ovarian cyst   . Diabetes mellitus without complication   . Hypothyroidism   . PONV (postoperative nausea and vomiting)   . Asthma     last attack 3 yrs ago  . Hypertension     dr Talmage Nap  . H/O hiatal hernia   . Goiter     Past Surgical History  Procedure Laterality Date  . Cholecystectomy    . Cesarean section    . Tonsillectomy    . Hernia repair    . Lipoma excision      L breast  . Tumor excision      Nerve sheath tumor, R arm  . Anterior lumbar fusion  12/18/2011    Procedure: ANTERIOR LUMBAR FUSION 1 LEVEL;  Surgeon: Barnett Abu, MD;  Location: [facility];  Service: Neurosurgery;  Laterality: N/A;  Lumbar four-five Anterior Lumbar Interbody Fusion with Dr. Arbie Cookey to do anterior exposure  . Abdominal exposure  12/18/2011    Procedure: ABDOMINAL EXPOSURE;  Surgeon: Larina Earthly, MD;  Location: [facility];  Service: Vascular;  Laterality: N/A;  Anterior Expossure for anterior lumbar interbody fuson    Family History  Problem Relation Age of Onset  . Heart disease Father   . Lung cancer Father   . Colon cancer Maternal Aunt   . Pancreatic cancer Maternal Grandmother   .  Pancreatic cancer Maternal Grandfather   . Pancreatic cancer Paternal Grandmother   . Cancer Paternal Grandfather    Social History:  reports that she has never smoked. She has never used smokeless tobacco. She reports that she does not drink alcohol or use illicit drugs.  Allergies:  Allergies  Allergen Reactions  . Erythromycin Other (See Comments)    arrhythmia    Medications Prior to Admission  Medication Sig Dispense Refill  . cetirizine (ZYRTEC) 10 MG tablet Take 10 mg by mouth every evening.      . Cholecalciferol (VITAMIN D) 2000 UNITS CAPS Take 1 capsule by mouth daily.      . cyclobenzaprine (FLEXERIL) 10 MG tablet Take 5 mg by mouth at bedtime.      [name] HYDROcodone-acetaminophen (NORCO/VICODIN) 5-325 MG per tablet Take 1 tablet by mouth daily as needed for pain.       [name] levonorgestrel (MIRENA) 20 MCG/24HR IUD 1 each by Intrauterine route once. Implanted end of April 2014      . levothyroxine (SYNTHROID, LEVOTHROID) 25 MCG tablet Take 25 mcg by mouth daily.      [name] lisinopril (PRINIVIL,ZESTRIL) 2.5 MG tablet Take 2.5 mg by mouth daily.      . metFORMIN (GLUCOPHAGE-XR) 500 MG 24 hr tablet Take 500 mg by mouth every evening.       [name]  Multiple Vitamin (MULTIVITAMIN WITH MINERALS) TABS Take 1 tablet by mouth daily.      [name] OVER THE COUNTER MEDICATION Take 2 tablets by mouth daily. Slim trim U 250 mg      . Oxycodone HCl 10 MG TABS Take 10 mg by mouth every 6 (six) hours as needed (for pain).       . Phenylephrine-Acetaminophen (TYLENOL SINUS CONGESTION/PAIN PO) Take 2 tablets by mouth daily as needed (for sinus congestion).         No results found for this or any previous visit (from the past 48 hour(s)). No results found.  Review of Systems  Constitutional: Negative.   Eyes: Negative.   Respiratory: Negative.   Cardiovascular: Negative.   Gastrointestinal: Negative.   Genitourinary: Negative.   Musculoskeletal: Positive for back pain.  Skin: Negative.   Neurological:        Bilateral sciatic-type pain  Endo/Heme/Allergies: Negative.   Psychiatric/Behavioral: Negative.     There were no vitals taken for this visit. Physical Exam  Constitutional: She is oriented to person, place, and time. She appears well-developed and well-nourished.  HENT:  Head: Atraumatic.  Eyes: Pupils are equal, round, and reactive to light.  Neck: Normal range of motion. Neck supple.  Cardiovascular: Normal rate and regular rhythm.   Respiratory: Effort normal and breath sounds normal.  GI: Soft. Bowel sounds are normal.  Musculoskeletal: Normal range of motion.  Neurological: She is alert and oriented to person, place, and time. She has normal reflexes.  Skin: Skin is warm.  Psychiatric: She has a normal mood and affect. Her behavior is normal. Judgment and thought content normal.     Assessment/Plan   Michelle Mueller had undergone an anterior lumbar interbody arthrodesis in November 2013 at the level of L4-5.  Radiographs have demonstrated significant stability; however, because of recurrent and persistent pain in her back, I have suggested that a CT scan be performed.  The CT scan was reviewed in the office today, and it demonstrates that she indeed has a pseudoarthrosis at the level of L4-5.  She has facet arthropathy, which is now more apparent than it had been previously.  This is worse on the left than on the right, although the right side of her back is what tends to give her more pain.    I suggested that the best way to combat this may be to perform posterior stabilization at L4-5 and a posterior arthrodesis.  She has been using an external fusion stimulator for nearly 2 months' time now and has not had any substantial improvement.  She tells me that her symptoms are limiting her activities of daily living.  She cannot do her house shopping and has been shopping by phone.  She can only make brief trips to the store for necessary items.  She notes that there is no way that she could return  to the workplace at this time, and I suggested that we should have her undergo posterior supplemental fusion with iliac crest bone graft.  After some discussion and review of all her findings, she is agreeable to proceeding.  We will schedule this at the earliest convenience.  Gerlad Pelzel J 06/18/2012, 7:57 AM

## 2012-06-19 LAB — CBC
Platelets: 268 10*3/uL (ref 150–400)
RBC: 4.2 MIL/uL (ref 3.87–5.11)
WBC: 13.1 10*3/uL — ABNORMAL HIGH (ref 4.0–10.5)

## 2012-06-19 LAB — BASIC METABOLIC PANEL
CO2: 23 mEq/L (ref 19–32)
Chloride: 104 mEq/L (ref 96–112)
GFR calc Af Amer: >90 mL/min (ref >90)
Potassium: 4.1 mEq/L (ref 3.5–5.1)
Sodium: 136 mEq/L (ref 135–145)

## 2012-06-19 MED FILL — Sodium Chloride IV Soln 0.9%: INTRAVENOUS | Qty: 2000 | Status: AC

## 2012-06-19 MED FILL — Heparin Sodium (Porcine) Inj 1000 Unit/ML: INTRAMUSCULAR | Qty: 30 | Status: AC

## 2012-06-19 NOTE — Progress Notes (Signed)
I agree with the following treatment note after reviewing documentation.   Johnston, Patrizia Paule Brynn   OTR/L Pager: [phone] Office: [phone] .   

## 2012-06-19 NOTE — Progress Notes (Signed)
Attempted to contact MD Elsner for pain medication order in am. Unable to receive first call while with another patient. Office called again and MD paged with no response. Office paged and I was able to get in touch with another MD regarding the pain orders this afternoon. This was to be passed to MD Elsner. No other call or orders were received.

## 2012-06-19 NOTE — Progress Notes (Signed)
Occupational Therapy Evaluation Patient Details Name: Michelle Mueller MRN: [id number] DOB: 07/31/1965 Today's Date: 06/19/2012 Time: 0824-0909 OT Time Calculation (min): 45 min  OT Assessment / Plan / Recommendation Clinical Impression  Pt is a 47 year old individual who underwent an anterior lumbar interbody arthrodesis in November of 2013. If she progressively became more active she noted recurrence of pain in the back buttock and both lower extremities. The latest CT scan showed pseudoarthrosis at L4-L5, she also has some moderate facet hypertrophy that has progressed since last surgery. Pt underwent a posterior supplemental fixation. Pt has a bone stimulator that needs to be worn x4hours per day per MD. Pt is able to recall 3/3 back precautions and ambulate to and from bathroom at supervison level. Pt performed ADL at sink level, and is able to toilet needing min assitance for manipulating clothing.    OT Assessment  Patient does not need any further OT services    Follow Up Recommendations  No OT follow up                Precautions / Restrictions Precautions Precautions: Back Precaution Booklet Issued:  (Previously issued "BAT" precautions and was able to recall) Precaution Comments: Pt knew 3/3 "BAT" precautions Required Braces or Orthoses: Spinal Brace Spinal Brace: Applied in sitting position Restrictions Weight Bearing Restrictions: No   Pertinent Vitals/Pain Pt started session with 5/10 pain (main pain in Right lower back and buttocks) and she said that pain increased slightly during the session to 5.5/10    ADL  Eating/Feeding: Performed;Independent Where Assessed - Eating/Feeding: Other (comment) (sitting up in bed) Grooming: Performed;Wash/dry hands;Teeth care;Brushing hair;Supervision/safety Where Assessed - Grooming: Unsupported standing Toilet Transfer: Performed;Min guard Toilet Transfer Method: Sit to Barista: Comfort height toilet;Grab  bars Toileting - Clothing Manipulation and Hygiene: Minimal assistance;Performed Where Assessed - Engineer, mining and Hygiene: Standing Equipment Used: Back brace;Gait belt;Rolling walker Transfers/Ambulation Related to ADLs: Pt complains of stiffness pain, and pain increasing during ambulation (from 5 to 5.5) Pt supervison for ambulation and sit<>stand. Pt needed min cueing for safe hand placement. ADL Comments: Pt able to perform all ADL (hand washing, teeth brushing, hair brushing) standing unsupported at sink with RW. OTS taught cup method for brushing teeth to prevent bending (Pt had been squatting previously) OTS also educated on ROM to stay within precautions during ADL and in home environment        Visit Information  Last OT Received On: 06/19/12 Assistance Needed: +1    Subjective Data  Subjective: "The pain is much worse this time" Patient Stated Goal: "To be pain free"   Prior Functioning     Home Living Lives With: Family;Son;Other (Comment) Available Help at Discharge: Family;Available PRN/intermittently Type of Home: House Home Access: Stairs to enter [employer] of Steps: 3 Entrance Stairs-Rails: None Home Layout: One level Bathroom Shower/Tub: Forensic scientist: Standard Bathroom Accessibility: Yes How Accessible: Accessible via walker Home Adaptive Equipment: Reacher;Other (comment) Additional Comments: Pt with avaliable 24 hour home care. Prior Function Level of Independence: Needs assistance Needs Assistance:  (couldn't go out for long periods, but all else was independe) Able to Take Stairs?: Yes Driving: Yes Vocation: Full time employment (prior to initial surgery, now on disability) Comments: Pt limited by pain recently, but was indpendent prior to symptoms that lead to initial surgery Nov. 2013 Communication Communication: No difficulties Dominant Hand: Left         Vision/Perception Vision -  History Baseline Vision: Other (comment) (  has trifocals, but only PRN (per Dr))   Alphonsus Sias  Cognition Arousal/Alertness: Awake/alert Behavior During Therapy: [facility] for tasks assessed/performed Overall Cognitive Status: Within Functional Limits for tasks assessed       Mobility Bed Mobility Bed Mobility: Rolling Left;Sit to Sidelying Right;Sitting - Scoot to Edge of Bed Rolling Right: 5: Supervision Rolling Left: 5: Supervision Right Sidelying to Sit: 5: Supervision Sitting - Scoot to Edge of Bed: 5: Supervision Sit to Sidelying Right: 5: Supervision;HOB flat Details for Bed Mobility Assistance: Pt recalled 3/3 precautions only requiring min v/c to recognize twisting during bed mobility. Transfers Transfers: Sit to Stand;Stand to Sit Sit to Stand: 5: Supervision;From chair/3-in-1;With upper extremity assist Stand to Sit: 5: Supervision;Without upper extremity assist;To bed;To chair/3-in-1 Details for Transfer Assistance: min v/c for proper hand placement.           End of Session OT - End of Session Equipment Utilized During Treatment: Gait belt;Back brace;Other (comment) (RW) Activity Tolerance: Patient tolerated treatment well Patient left: in chair;with call bell/phone within reach Nurse Communication: Mobility status  GO Functional Assessment Tool Used: clinical judgement Functional Limitation: Self care Self Care Current Status (O9629): At least 1 percent but less than 20 percent impaired, limited or restricted Self Care Goal Status (B2841): At least 1 percent but less than 20 percent impaired, limited or restricted Self Care Discharge Status [phone]): At least 1 percent but less than 20 percent impaired, limited or restricted   Sherryl Manges 06/19/2012, 11:25 AM

## 2012-06-19 NOTE — Evaluation (Signed)
Physical Therapy Evaluation Patient Details Name: Michelle Mueller MRN: [id number] DOB: [birth year] Today's Date: 06/19/2012 Time: 4098-1191 PT Time Calculation (min): 28 min  PT Assessment / Plan / Recommendation Clinical Impression  Pt is a 47 year old individual who underwent an anterior lumbar interbody arthrodesis in November of 2013. Pt now with posterior supplemental fixation. Pt able to recall all precautions. Pt demonstrating bed mobility with supervision. During ambulation with RW, pt with decreased stance time on right LE due to acute pain in right hip region. Pt would benefit from acute PT to maximize functional mobility and saftey for d/c home.    PT Assessment  Patient needs continued PT services    Follow Up Recommendations  No PT follow up;Supervision for mobility/OOB       Barriers to Discharge None      Equipment Recommendations  Rolling walker with 5" wheels    Recommendations for Other Services     Frequency Min 5X/week    Precautions / Restrictions Precautions Precautions: Back Precaution Booklet Issued:  (Previously issued "BAT" precautions and was able to recall) Precaution Comments: Pt knew 3/3 "BAT" precautions Required Braces or Orthoses: Spinal Brace Spinal Brace: Applied in sitting position Restrictions Weight Bearing Restrictions: No   Pertinent Vitals/Pain Pt with 7/10 pain in right hip with WB. Pt repositioned and ambulated. Pt noting that "it feels better as I walk to a certain point"      Mobility  Bed Mobility Bed Mobility: Rolling Left;Sit to Sidelying Right;Sitting - Scoot to Edge of Bed Rolling Right: 5: Supervision Rolling Left: 5: Supervision Right Sidelying to Sit: 5: Supervision Sitting - Scoot to Edge of Bed: 5: Supervision Sit to Sidelying Right: 5: Supervision;HOB flat Details for Bed Mobility Assistance: Pt recalled 3/3 precautions only requiring min v/c to recognize twisting during bed mobility. Transfers Transfers: Sit to  Stand;Stand to Sit Sit to Stand: 5: Supervision;From chair/3-in-1;With upper extremity assist Stand to Sit: 5: Supervision;Without upper extremity assist;To bed;To chair/3-in-1 Details for Transfer Assistance: min v/c for proper hand placement. Ambulation/Gait Ambulation/Gait Assistance: 4: Min guard Ambulation Distance (Feet): 250 Feet Assistive device: Rolling walker Ambulation/Gait Assistance Details: v/c for proper use of RW. Min guard for saftey due to pt limited wight bearing on right LE due to pain. Gait Pattern: Step-through pattern;Decreased stance time - right;Decreased stride length;Antalgic;Decreased hip/knee flexion - left;Decreased hip/knee flexion - right Gait velocity: decreased Stairs: No Wheelchair Mobility Wheelchair Mobility: No        PT Diagnosis: Difficulty walking;Acute pain  PT Problem List: Decreased strength;Decreased activity tolerance;Decreased mobility;Pain;Decreased knowledge of use of DME PT Treatment Interventions: DME instruction;Gait training;Stair training;Functional mobility training;Therapeutic activities;Balance training;Patient/family education   PT Goals Acute Rehab PT Goals PT Goal Formulation: With patient Time For Goal Achievement: 06/26/12 Potential to Achieve Goals: Good Pt will Roll Supine to Right Side: with modified independence PT Goal: Rolling Supine to Right Side - Progress: Goal set today Pt will go Supine/Side to Sit: with modified independence PT Goal: Supine/Side to Sit - Progress: Goal set today Pt will go Sit to Stand: with modified independence PT Goal: Sit to Stand - Progress: Goal set today Pt will go Stand to Sit: with modified independence PT Goal: Stand to Sit - Progress: Goal set today Pt will Ambulate: >150 feet;with least restrictive assistive device;with modified independence PT Goal: Ambulate - Progress: Goal set today Pt will Go Up / Down Stairs: 3-5 stairs;with modified independence PT Goal: Up/Down Stairs -  Progress: Goal set today  Visit Information  Last PT Received On: 06/19/12 Assistance Needed: +1    Subjective Data  Subjective: "This surgery hurts more than last time" Patient Stated Goal: return home   Prior Functioning  Home Living Lives With: Family;Son;Other (Comment) Available Help at Discharge: Family;Available PRN/intermittently Type of Home: House Home Access: Stairs to enter [employer] of Steps: 3 Entrance Stairs-Rails: None Home Layout: One level Bathroom Shower/Tub: Forensic scientist: Standard Bathroom Accessibility: Yes How Accessible: Accessible via walker Home Adaptive Equipment: Reacher;Other (comment) Additional Comments: Pt with avaliable 24 hour home care. Prior Function Level of Independence: Needs assistance Needs Assistance:  (couldn't go out for long periods, but all else was independe) Able to Take Stairs?: Yes Driving: Yes Vocation: Full time employment (prior to initial surgery, now on disability) Comments: Pt limited by pain recently, but was indpendent prior to symptoms that lead to initial surgery Nov. 2013 Communication Communication: No difficulties Dominant Hand: Left    Cognition  Cognition Arousal/Alertness: Awake/alert Behavior During Therapy: WFL for tasks assessed/performed Overall Cognitive Status: Within Functional Limits for tasks assessed    Extremity/Trunk Assessment Right Upper Extremity Assessment RUE ROM/Strength/Tone: [facility] for tasks assessed Left Upper Extremity Assessment LUE ROM/Strength/Tone: [facility] for tasks assessed Right Lower Extremity Assessment RLE ROM/Strength/Tone: Due to pain;Deficits RLE ROM/Strength/Tone Deficits: Decreased weight bearing ability. Left Lower Extremity Assessment LLE ROM/Strength/Tone: WFL for tasks assessed Trunk Assessment Trunk Assessment: Normal   Balance Balance Balance Assessed: Yes Dynamic Sitting Balance Dynamic Sitting - Balance Support: No upper  extremity supported;Feet supported Dynamic Sitting - Level of Assistance: 5: Stand by assistance Dynamic Sitting - Comments: Pt sitting EOB donning lumbar corset  End of Session PT - End of Session Equipment Utilized During Treatment: Gait belt;Back brace Activity Tolerance: Patient limited by pain Patient left: in bed;with call bell/phone within reach;with bed alarm set Nurse Communication: Mobility status  GP     Payton Doughty 06/19/2012, 11:03 AM Payton Doughty, PT Student

## 2012-06-19 NOTE — Evaluation (Signed)
I have read and agree with the below assessment and plan.   Johnathyn Viscomi Helen Whitlow PT, DPT Pager: [phone] 

## 2012-06-20 ENCOUNTER — Observation Stay (HOSPITAL_COMMUNITY): Payer: BC Managed Care – PPO

## 2012-06-20 DIAGNOSIS — S32009K Unspecified fracture of unspecified lumbar vertebra, subsequent encounter for fracture with nonunion: Secondary | ICD-10-CM

## 2012-06-20 MED ORDER — DEXAMETHASONE SODIUM PHOSPHATE 4 MG/ML IJ SOLN
8.0000 mg | Freq: Once | INTRAMUSCULAR | Status: AC
  Administered 2012-06-20: 8 mg via INTRAVENOUS
  Filled 2012-06-20: qty 2

## 2012-06-20 MED ORDER — KETOROLAC TROMETHAMINE 15 MG/ML IJ SOLN
15.0000 mg | Freq: Four times a day (QID) | INTRAMUSCULAR | Status: AC
  Administered 2012-06-20 – 2012-06-21 (×5): 15 mg via INTRAVENOUS
  Filled 2012-06-20 (×5): qty 1

## 2012-06-20 NOTE — Progress Notes (Signed)
Physical Therapy Treatment Patient Details Name: Michelle Mueller MRN: [id number] DOB: [birth year] Today's Date: 06/20/2012 Time: 6295-2841 PT Time Calculation (min): 39 min  PT Assessment / Plan / Recommendation Comments on Treatment Session  47 y/o female s/p lumbar fixation. Progressing nicely today. Reviewed importance of frequent position changes to prevent increased discomfort as well as performing short frequent walks. Is a little drowsy today because of muscle relaxer but moving well.    Follow Up Recommendations  No PT follow up;Supervision for mobility/OOB     Does the patient have the potential to tolerate intense rehabilitation     Barriers to Discharge        Equipment Recommendations  Rolling walker with 5" wheels    Recommendations for Other Services    Frequency Min 5X/week   Plan Discharge plan remains appropriate;Frequency remains appropriate    Precautions / Restrictions Precautions Precautions: Fall Precaution Comments: reviewed BAT, will provide handout next session; patient lying in bed with her brace on, I reviewed importance of taking it off prior to lying down and having something between her skin and the brace Spinal Brace: Applied in sitting position;Lumbar corset Restrictions Weight Bearing Restrictions: No   Pertinent Vitals/Pain Reports surgical pain    Mobility  Bed Mobility Bed Mobility: Rolling Right;Right Sidelying to Sit Rolling Right: 5: Supervision Right Sidelying to Sit: HOB flat;5: Supervision Details for Bed Mobility Assistance: increased time due to pain, cues to reduce twisting when coming to sit Transfers Transfers: Sit to Stand;Stand to Sit Sit to Stand: 5: Supervision;With upper extremity assist;From bed;From toilet;With armrests Stand to Sit: 5: Supervision;To chair/3-in-1;To toilet;With upper extremity assist;With armrests Details for Transfer Assistance: min v/c for proper hand placement. Ambulation/Gait Ambulation/Gait  Assistance: 5: Supervision Ambulation Distance (Feet): 120 Feet Assistive device: Rolling walker Ambulation/Gait Assistance Details: cues for tall posture and relaxed shoulders as well as safer sequencing with RW Gait Pattern: Step-through pattern;Decreased stride length Gait velocity: decreased General Gait Details: decreased step heigh tbilaterally    Exercises General Exercises - Lower Extremity Ankle Circles/Pumps: AROM;Both;10 reps;Supine   PT Goals Acute Rehab PT Goals PT Goal: Rolling Supine to Right Side - Progress: Progressing toward goal PT Goal: Supine/Side to Sit - Progress: Progressing toward goal PT Goal: Sit to Stand - Progress: Progressing toward goal PT Goal: Stand to Sit - Progress: Progressing toward goal PT Goal: Ambulate - Progress: Progressing toward goal  Visit Information  Last PT Received On: 06/20/12 Assistance Needed: +1    Subjective Data  Subjective: I am feeling really sleepy.  Patient Stated Goal: home, walk her two golden retrievers   Cognition  Cognition Arousal/Alertness: Awake/alert Behavior During Therapy: WFL for tasks assessed/performed Overall Cognitive Status: Within Functional Limits for tasks assessed    Balance  Dynamic Sitting Balance Dynamic Sitting - Balance Support: No upper extremity supported Dynamic Sitting - Level of Assistance: 6: Modified independent (Device/Increase time) Dynamic Sitting - Comments: pt stood at the sink to perform ADLs with good adherence to back precautions, able to perform minisquat to reduce bending  End of Session PT - End of Session Equipment Utilized During Treatment: Gait belt;Back brace Activity Tolerance: Patient tolerated treatment well;Patient limited by fatigue;Patient limited by pain Patient left: with call bell/phone within reach;in chair Nurse Communication: Mobility status   GP     [facility] HELEN 06/20/2012, 11:03 AM

## 2012-06-20 NOTE — Progress Notes (Signed)
Subjective: Patient reports Right buttock and leg pain likely from graft site on right posterior suprailiac crest  Objective: Vital signs in last 24 hours: Temp:  [97.9 F (36.6 C)-98.7 F (37.1 C)] 98.7 F (37.1 C) (05/22 0944) Pulse Rate:  [98-119] 105 (05/22 0944) Resp:  [18-20] 18 (05/22 0944) BP: (98-134)/(54-80) 98/54 mmHg (05/22 0944) SpO2:  [97 %-99 %] 97 % (05/22 0944)  Intake/Output from previous day:   Intake/Output this shift:    Incision clean and dry dressing intact  Lab Results:  Recent Labs  06/19/12 0545  WBC 13.1*  HGB 11.5*  HCT 34.2*  PLT 268   BMET  Recent Labs  06/19/12 0545  NA 136  K 4.1  CL 104  CO2 23  GLUCOSE 151*  BUN 12  CREATININE 0.77  CALCIUM 8.3*    Studies/Results: Dg C-arm Gt 120 Min-no Report  06/18/2012   CLINICAL DATA: Non-union, lumbago, stenosis   C-ARM GT 120 MINUTE  Fluoroscopy was utilized by the requesting physician.  No radiographic  interpretation.     Assessment/Plan: Mobilizing very slowly secondary to pain. We'll add Toradol to see if pain control is   LOS: 2 days  And Toradol mobilize patient as tolerated.   Owais Pruett J 06/20/2012, 12:18 PM

## 2012-06-21 MED ORDER — OXYCODONE HCL 10 MG PO TABS: 10.0000 mg | ORAL_TABLET | Freq: Four times a day (QID) | ORAL | Status: AC | PRN

## 2012-06-21 MED ORDER — METHOCARBAMOL 500 MG PO TABS: 500.0000 mg | ORAL_TABLET | Freq: Four times a day (QID) | ORAL | Status: DC | PRN

## 2012-06-21 MED ORDER — BISACODYL 10 MG RE SUPP
10.0000 mg | Freq: Every day | RECTAL | Status: DC | PRN
  Administered 2012-06-21: 10 mg via RECTAL
  Filled 2012-06-21: qty 1

## 2012-06-21 NOTE — Progress Notes (Signed)
Subjective: Patient reports Pain much improved but no bowel movement yet  Objective: Vital signs in last 24 hours: Temp:  [97.9 F (36.6 C)-98.7 F (37.1 C)] 98.3 F (36.8 C) (05/23 1400) Pulse Rate:  [65-95] 95 (05/23 1400) Resp:  [18-19] 18 (05/23 1400) BP: (118-127)/(63-78) 118/68 mmHg (05/23 1400) SpO2:  [97 %-100 %] 98 % (05/23 1400)  Intake/Output from previous day: 05/22 0701 - 05/23 0700 In: 120 [P.O.:120] Out: -  Intake/Output this shift:    incisions are clean and dry dressings are removed today  Lab Results:  Recent Labs  06/19/12 0545  WBC 13.1*  HGB 11.5*  HCT 34.2*  PLT 268   BMET  Recent Labs  06/19/12 0545  NA 136  K 4.1  CL 104  CO2 23  GLUCOSE 151*  BUN 12  CREATININE 0.77  CALCIUM 8.3*    Studies/Results: No results found.  Assessment/Plan: Stable post  LOS: 3 days  Add Dulcolax suppository patient may shower plan discharge in a.m.   Kentarius Partington J 06/21/2012, 3:16 PM

## 2012-06-21 NOTE — Discharge Summary (Signed)
Physician Discharge Summary  Patient ID: Michelle Mueller MRN: [id number] DOB/AGE: 1965-03-05 47 y.o.  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 Patient had a pseudoarthrosis. She had initial significant right buttock and leg pain were an iliac crest bone graft was taken this is largely improved.  Consults: None  Significant Diagnostic Studies: None  Treatments: Posterior stabilization with pedicle screw fixation L4-5 posterior arthrodesis crest autograft  Discharge Exam: Blood pressure 118/68, pulse 95, temperature 98.3 F (36.8 C), temperature source Oral, resp. rate 18, height 5\' 4"  (1.626 m), weight 102.2 kg (225 lb 5 oz), SpO2 98.00%. Incision/Wound: Incision is clean and dry motor function is good in lower extremity  Disposition: 01-Home or Self Care  Discharge Orders   Future Appointments Provider Department Dept Phone   07/15/2012 11:30 AM [facility] [place] [facility] [place] [facility] [phone]   Future Orders Complete By Expires     Call MD for:  redness, tenderness, or signs of infection (pain, swelling, redness, odor or green/yellow discharge around incision site)  As directed     Call MD for:  severe uncontrolled pain  As directed     Call MD for:  temperature >100.4  As directed     Diet - low sodium heart healthy  As directed     Discharge instructions  As directed     Comments:      Okay to shower. Do not apply salves or appointments to incision. No heavy lifting with the upper extremities greater than 15 pounds. May resume driving when not requiring pain medication and  patient feels comfortable with doing so.    Increase activity slowly  As directed         Medication List    TAKE these medications       cetirizine 10 MG tablet  Commonly known as:  ZYRTEC  Take 10 mg by mouth every evening.     cyclobenzaprine 10 MG tablet  Commonly known as:  FLEXERIL  Take 5 mg by mouth at bedtime.     HYDROcodone-acetaminophen 5-325 MG per tablet  Commonly known as:  NORCO/VICODIN  Take 1 tablet by mouth daily as needed for pain.     levonorgestrel 20 MCG/24HR IUD  Commonly known as:  MIRENA  1 each by Intrauterine route once. Implanted end of April 2014     levothyroxine 25 MCG tablet  Commonly known as:  SYNTHROID, LEVOTHROID  Take 25 mcg by mouth daily.     lisinopril 2.5 MG tablet  Commonly known as:  PRINIVIL,ZESTRIL  Take 2.5 mg by mouth daily.     metFORMIN 500 MG 24 hr tablet  Commonly known as:  GLUCOPHAGE-XR  Take 500 mg by mouth every evening.     methocarbamol 500 MG tablet  Commonly known as:  ROBAXIN  Take 1 tablet (500 mg total) by mouth every 6 (six) hours as needed.     multivitamin with minerals Tabs  Take 1 tablet by mouth daily.     OVER THE COUNTER MEDICATION  Take 2 tablets by mouth daily. Slim trim U 250 mg     Oxycodone HCl 10 MG Tabs  Take 10 mg by mouth every  6 (six) hours as needed (for pain).     Oxycodone HCl 10 MG Tabs  Take 1 tablet (10 mg total) by mouth every 6 (six) hours as needed (for pain).     TYLENOL SINUS CONGESTION/PAIN PO  Take 2 tablets by mouth daily as needed (for sinus congestion).     Vitamin D 2000 UNITS Caps  Take 1 capsule by mouth daily.         SignedStefani Dama 06/21/2012, 3:27 PM

## 2012-06-21 NOTE — Progress Notes (Signed)
Physical Therapy Treatment Patient Details Name: Michelle Mueller MRN: [id number] DOB: 12-31-1965 Today's Date: 06/21/2012 Time: 7829-5621 PT Time Calculation (min): 29 min  PT Assessment / Plan / Recommendation Comments on Treatment Session  47 y/o female s/p lumbar fixation. Pain and mobility greatly improved today.     Follow Up Recommendations  No PT follow up;Supervision - Intermittent     Does the patient have the potential to tolerate intense rehabilitation     Barriers to Discharge        Equipment Recommendations  Rolling walker with 5" wheels    Recommendations for Other Services    Frequency     Plan Frequency remains appropriate;Discharge plan needs to be updated    Precautions / Restrictions Precautions Precautions: Fall Precaution Comments: Patient independently reporting 3/3 back precautions Required Braces or Orthoses: Spinal Brace Spinal Brace: Applied in sitting position;Lumbar corset Restrictions Weight Bearing Restrictions: No   Pertinent Vitals/Pain Minimal pain 3-4/5 when not ambulation, 5 when ambulating; RN aware, pain meds provided at the end of our session    Mobility  Bed Mobility Rolling Left: 5: Supervision Sit to Sidelying Right: 6: Modified independent (Device/Increase time);HOB flat Details for Bed Mobility Assistance: cues for back precautions when turing in the bed to supine Transfers Transfers: Sit to Stand;Stand to Sit Sit to Stand: 6: Modified independent (Device/Increase time);With upper extremity assist Stand to Sit: 5: Supervision;With upper extremity assist Details for Transfer Assistance: slow to rise due to pain, cues to decrease bending Ambulation/Gait Ambulation/Gait Assistance: 5: Supervision Ambulation Distance (Feet): 200 Feet Assistive device: Rolling walker Ambulation/Gait Assistance Details: cues for tall posture, one seated rest  Gait Pattern: Step-through pattern;Decreased stride length Gait velocity:  decreased Stairs: Yes Stairs Assistance: 5: Supervision;4: Min assist;6: Modified independent (Device/Increase time) Stairs Assistance Details (indicate cue type and reason): 2 steps backwards with RW, minA for sequencing and to stabilize RW; 2 steps sideways with rail on the left, cues for sequencing, supervision only; 2 steps forward with 2 rails modified independent Stair Management Technique: Forwards;Sideways;Backwards;Step to pattern;With walker;One rail Left;Two rails Number of Stairs: 6      PT Goals Acute Rehab PT Goals PT Goal: Sit to Stand - Progress: Met PT Goal: Stand to Sit - Progress: Met PT Goal: Ambulate - Progress: Progressing toward goal PT Goal: Up/Down Stairs - Progress: Progressing toward goal  Visit Information  Last PT Received On: 06/21/12 Assistance Needed: +1    Subjective Data  Subjective: I feel much better today.   Cognition  Cognition Arousal/Alertness: Awake/alert Behavior During Therapy: WFL for tasks assessed/performed Overall Cognitive Status: Within Functional Limits for tasks assessed    Balance     End of Session PT - End of Session Equipment Utilized During Treatment: Gait belt;Back brace Activity Tolerance: Patient tolerated treatment well;Patient limited by fatigue;Patient limited by pain Patient left: in bed;with call bell/phone within reach Nurse Communication: Mobility status   GP     [facility] HELEN 06/21/2012, 12:13 PM

## 2012-06-21 NOTE — Op Note (Signed)
Date of surgery: 06/18/2012 Preoperative diagnosis: Pseudoarthrosis anterior lumbar arthrodesis L4-L5 Postoperative diagnosis: Pseudoarthrosis anterior lumbar arthrodesis L4-L5 Procedure: Posterior fixation L4-L5 with pedicle screws posterior arthrodesis with local autograft obtained from iliac crest to be a separate fascial incision Surgeon: Barnett Abu Assistant: Sharyon Cable Anesthesia: Gen. endotracheal Indications: The patient is a 47 year old individual who has had a previous anterior lumbar interbody arthrodesis at L4-L5. She initially did well, however after approximately 2 months the patient notes the significant return of substantial pain in her back and both lower extremities followup studies demonstrated the patient developed a pseudoarthrosis. There is no obvious compression of the nerve roots however bone was not healing . After use of an external fusion stimulator for a few months, it was decided to proceed with posterior stabilization and fusion using iliac crest bone graft.  Procedure: The patient was brought to the operating room supine on a stretcher. After the smooth induction of general endotracheal anesthesia she was carefully turned prone onto the [facility] table. We used Brain[employer] navigation to obtain imaging for placement of pedicle screws. After prepping the back with alcohol and DuraPrep, 2 pins were placed in the posterior superior iliac spine on the right side. The reference frame for navigation was placed on these to close. We obtained the fluoroscopic reference images. These were correlated with the actual open spaces when the lumbar incision was opened and the initial anatomy was identified. Then we placed pedicle screws in L4 and L5 using the navigation reference. 6.5 x 40 mm screws were placed in L4 and 6.5 x 35 mm screws were placed in L5. The posterior interlaminar space at L4-L5 was cleared and the facet joints were decorticated at L4-L5 area then by using a separate  fascial incision on the right side after removing the navigation frame I opened the iliac crest on that right side. The outer table of the ilium was then opened using some osteotomes and bone graft was obtained using sitting gouges to obtain cortical cancellus strips first and then underlying cancellus bone was also obtained using a series of gouges and curettes. When adequate sample of bone was obtained the fascia overlying the posterior superior iliac crest was closed with 0 Vicryl. Attention was then turned to the surgical bed were then decorticated interlaminar space and facet joints were packed with iliac crest bone graft. 5 cc of additional the cost was packed into the interstices to fill up the space. Once the graft was packed adequately the lumbar dorsal fascia was closed with #1 Vicryl in interrupted fashion. 2-0 Vicryl was used in the subcutaneous tissues, 3-0 Vicryl was used subcuticularly. Blood loss was estimated at some 700 cc. 150 cc of Cell Saver blood was returned to the patient.

## 2012-06-22 NOTE — Progress Notes (Signed)
Physical Therapy Treatment Patient Details Name: Michelle Mueller MRN: [id number] DOB: November 21, 1965 Today's Date: 06/22/2012 Time: 4098-1191 PT Time Calculation (min): 18 min  PT Assessment / Plan / Recommendation Comments on Treatment Session  Pt cont's to make steady progress & demonstrate safe technique with mobility.      Follow Up Recommendations  No PT follow up;Supervision - Intermittent     Does the patient have the potential to tolerate intense rehabilitation     Barriers to Discharge        Equipment Recommendations  Rolling walker with 5" wheels    Recommendations for Other Services    Frequency Min 5X/week   Plan      Precautions / Restrictions Precautions Precautions: Fall Required Braces or Orthoses: Spinal Brace Spinal Brace: Applied in sitting position;Lumbar corset Restrictions Weight Bearing Restrictions: No       Mobility  Bed Mobility Bed Mobility: Not assessed Transfers Transfers: Sit to Stand;Stand to Sit Sit to Stand: 6: Modified independent (Device/Increase time);With upper extremity assist;From bed Stand to Sit: 6: Modified independent (Device/Increase time);With upper extremity assist;With armrests;To chair/3-in-1 Ambulation/Gait Ambulation/Gait Assistance: 5: Supervision Ambulation Distance (Feet): 200 Feet Assistive device: Rolling walker Ambulation/Gait Assistance Details: (S) for safety more towards end of ambulation due to RLE cramping + discomfort increasing.   Gait Pattern: Step-through pattern;Decreased stride length Gait velocity: decreased General Gait Details: Pt asking about when she could progress away from RW.  Encouraged her to stay with RW initially & especially for longer distances due to cramping & pain but to trial shorter distances without AD & have someone beside her in case she needs support.   Stairs: Yes Stairs Assistance: 4: Min assist;5: Supervision Stairs Assistance Details (indicate cue type and reason): Pt able to  safely return demonstration of all 3 ways that she performed in previous PT session.  Min VC's for RW placement with backwards technique.   Stair Management Technique: One rail Left;Two rails;No rails;Step to pattern;Sideways;Backwards;Forwards;With walker Number of Stairs: 2 (3x's) Wheelchair Mobility Wheelchair Mobility: No      PT Goals Acute Rehab PT Goals Time For Goal Achievement: 06/26/12 Potential to Achieve Goals: Good Pt will Roll Supine to Right Side: with modified independence Pt will go Supine/Side to Sit: with modified independence Pt will go Sit to Stand: with modified independence PT Goal: Sit to Stand - Progress: Met Pt will go Stand to Sit: with modified independence PT Goal: Stand to Sit - Progress: Met Pt will Ambulate: >150 feet;with least restrictive assistive device;with modified independence PT Goal: Ambulate - Progress: Progressing toward goal Pt will Go Up / Down Stairs: 3-5 stairs;with modified independence PT Goal: Up/Down Stairs - Progress: Progressing toward goal  Visit Information  Last PT Received On: 06/22/12 Assistance Needed: +1    Subjective Data      Cognition  Cognition Arousal/Alertness: Awake/alert Behavior During Therapy: WFL for tasks assessed/performed Overall Cognitive Status: Within Functional Limits for tasks assessed    Balance     End of Session PT - End of Session Equipment Utilized During Treatment: Back brace Activity Tolerance: Patient tolerated treatment well Patient left: in chair;with call bell/phone within reach Nurse Communication: Mobility status     Verdell Face, [place] [phone] 06/22/2012

## 2012-06-22 NOTE — Progress Notes (Signed)
Occupational Therapy Treatment and Discharge Patient Details Name: Michelle Mueller MRN: [id number] DOB: November 14, 1965 Today's Date: 06/22/2012 Time: 1025-1106 OT Time Calculation (min): 41 min  OT Assessment / Plan / Recommendation Comments on Treatment Session Pt requested RN contact OT to review back precautions once again prior to her d/c home today.  Educated pt at length and provided/reviewed handout.  Pt is ready for d/c.    Follow Up Recommendations  No OT follow up;Supervision - Intermittent    Barriers to Discharge       Equipment Recommendations  None recommended by OT    Recommendations for Other Services    Frequency     Plan Discharge plan remains appropriate    Precautions / Restrictions Precautions Precautions: Back Precaution Booklet Issued: Yes (comment) Precaution Comments: Reviewed back precautions with mobility and ADL. Required Braces or Orthoses: Spinal Brace Spinal Brace: Applied in sitting position;Lumbar corset Restrictions Weight Bearing Restrictions: No   Pertinent Vitals/Pain No c/o pain.    ADL  Toilet Transfer: Modified independent Toilet Transfer Method: Sit to Barista: Comfort height toilet Toileting - Clothing Manipulation and Hygiene: Modified independent Where Assessed - Toileting Clothing Manipulation and Hygiene: Sit to stand from 3-in-1 or toilet Tub/Shower Transfer: Supervision/safety Tub/Shower Transfer Method: Ambulating Equipment Used: Back brace;Gait belt;Rolling walker Transfers/Ambulation Related to ADLs: mod I with RW ADL Comments: Educated/reviewed at length back precautions related to bed mobility, toilet and tub transfers, LB ADL and IADL.  Pt verbalized understanding.  Donned and doffed back brace independently.    OT Diagnosis:    OT Problem List:   OT Treatment Interventions:     OT Goals    Visit Information  Last OT Received On: 06/22/12 Assistance Needed: +1    Subjective Data       Prior Functioning       Cognition  Cognition Arousal/Alertness: Awake/alert Behavior During Therapy: WFL for tasks assessed/performed Overall Cognitive Status: Within Functional Limits for tasks assessed    Mobility  Bed Mobility Bed Mobility: Rolling Right;Right Sidelying to Sit Rolling Right: 6: Modified independent (Device/Increase time) Right Sidelying to Sit: 6: Modified independent (Device/Increase time);HOB flat Sitting - Scoot to Edge of Bed: 7: Independent Transfers Sit to Stand: 6: Modified independent (Device/Increase time);With upper extremity assist;From bed;From toilet Stand to Sit: 6: Modified independent (Device/Increase time);With upper extremity assist;To bed;To toilet    Exercises      Balance     End of Session OT - End of Session Activity Tolerance: Patient tolerated treatment well Patient left: in bed;with call bell/phone within reach;with nursing in room (EOB)  GO     Evern Bio 06/22/2012, 11:12 AM

## 2012-06-25 NOTE — Care Management Note (Signed)
    Page 1 of 1   06/25/2012     12:28:28 PM   CARE MANAGEMENT NOTE 06/25/2012  Patient:  Michelle Mueller, Michelle Mueller   Account Number:  [id number]  Date Initiated:  06/20/2012  Documentation initiated by:  [facility]  Subjective/Objective Assessment:   admitted postop L4-5 PLIF     Action/Plan:   Pt/Ot evals-no follow up recommended   Anticipated DC Date:  06/23/2012   Anticipated DC Plan:  HOME/SELF CARE      DC Planning Services  CM consult      Choice offered to / List presented to:     DME arranged  Levan Hurst      DME agency  [facility]        Status of service:  Completed, signed off Medicare Important Message given?   (If response is "NO", the following Medicare IM given date fields will be blank) Date Medicare IM given:   Date Additional Medicare IM given:    Discharge Disposition:  HOME/SELF CARE  Per UR Regulation:  Reviewed for med. necessity/level of care/duration of stay  If discussed at Long Length of Stay Meetings, dates discussed:    Comments:

## 2012-06-30 LAB — HM PAP SMEAR

## 2012-07-05 ENCOUNTER — Encounter (HOSPITAL_COMMUNITY): Payer: Self-pay | Admitting: Emergency Medicine

## 2012-07-05 ENCOUNTER — Emergency Department (HOSPITAL_COMMUNITY)
Admission: EM | Admit: 2012-07-05 | Discharge: 2012-07-05 | Disposition: A | Discharge status: Home / Self Care | Payer: BC Managed Care – PPO | Attending: Emergency Medicine | Admitting: Emergency Medicine

## 2012-07-05 DIAGNOSIS — Z8639 Personal history of other endocrine, nutritional and metabolic disease: Secondary | ICD-10-CM | POA: Insufficient documentation

## 2012-07-05 DIAGNOSIS — Z8742 Personal history of other diseases of the female genital tract: Secondary | ICD-10-CM | POA: Insufficient documentation

## 2012-07-05 DIAGNOSIS — K644 Residual hemorrhoidal skin tags: Secondary | ICD-10-CM | POA: Insufficient documentation

## 2012-07-05 DIAGNOSIS — E039 Hypothyroidism, unspecified: Secondary | ICD-10-CM | POA: Insufficient documentation

## 2012-07-05 DIAGNOSIS — M79609 Pain in unspecified limb: Secondary | ICD-10-CM | POA: Insufficient documentation

## 2012-07-05 DIAGNOSIS — T364X5A Adverse effect of tetracyclines, initial encounter: Secondary | ICD-10-CM | POA: Insufficient documentation

## 2012-07-05 DIAGNOSIS — R42 Dizziness and giddiness: Secondary | ICD-10-CM | POA: Insufficient documentation

## 2012-07-05 DIAGNOSIS — I10 Essential (primary) hypertension: Secondary | ICD-10-CM | POA: Insufficient documentation

## 2012-07-05 DIAGNOSIS — J45909 Unspecified asthma, uncomplicated: Secondary | ICD-10-CM | POA: Insufficient documentation

## 2012-07-05 DIAGNOSIS — E119 Type 2 diabetes mellitus without complications: Secondary | ICD-10-CM | POA: Insufficient documentation

## 2012-07-05 DIAGNOSIS — L299 Pruritus, unspecified: Secondary | ICD-10-CM | POA: Insufficient documentation

## 2012-07-05 DIAGNOSIS — Z79899 Other long term (current) drug therapy: Secondary | ICD-10-CM | POA: Insufficient documentation

## 2012-07-05 DIAGNOSIS — Z862 Personal history of diseases of the blood and blood-forming organs and certain disorders involving the immune mechanism: Secondary | ICD-10-CM | POA: Insufficient documentation

## 2012-07-05 DIAGNOSIS — M549 Dorsalgia, unspecified: Secondary | ICD-10-CM | POA: Insufficient documentation

## 2012-07-05 DIAGNOSIS — Z8719 Personal history of other diseases of the digestive system: Secondary | ICD-10-CM | POA: Insufficient documentation

## 2012-07-05 DIAGNOSIS — R209 Unspecified disturbances of skin sensation: Secondary | ICD-10-CM | POA: Insufficient documentation

## 2012-07-05 DIAGNOSIS — T7840XA Allergy, unspecified, initial encounter: Secondary | ICD-10-CM

## 2012-07-05 DIAGNOSIS — R6883 Chills (without fever): Secondary | ICD-10-CM | POA: Insufficient documentation

## 2012-07-05 LAB — URINALYSIS, ROUTINE W REFLEX MICROSCOPIC
Glucose, UA: NEGATIVE mg/dL
Ketones, ur: NEGATIVE mg/dL
Leukocytes, UA: NEGATIVE
Nitrite: NEGATIVE
Protein, ur: NEGATIVE mg/dL

## 2012-07-05 LAB — BASIC METABOLIC PANEL
Calcium: 9 mg/dL (ref 8.4–10.5)
Creatinine, Ser: 0.93 mg/dL (ref 0.50–1.10)
GFR calc Af Amer: 85 mL/min — ABNORMAL LOW (ref >90)

## 2012-07-05 LAB — CBC WITH DIFFERENTIAL/PLATELET
Basophils Absolute: 0.1 10*3/uL (ref 0.0–0.1)
Basophils Relative: 1 % (ref 0–1)
Eosinophils Relative: 5 % (ref 0–5)
HCT: 37.4 % (ref 36.0–46.0)
MCHC: 33.2 g/dL (ref 30.0–36.0)
MCV: 81.7 fL (ref 78.0–100.0)
Monocytes Absolute: 0.6 10*3/uL (ref 0.1–1.0)
RDW: 13.7 % (ref 11.5–15.5)

## 2012-07-05 MED ORDER — CEFAZOLIN SODIUM-DEXTROSE 2-3 GM-% IV SOLR
2.0000 g | Freq: Once | INTRAVENOUS | Status: AC
  Administered 2012-07-05: 2 g via INTRAVENOUS
  Filled 2012-07-05: qty 50

## 2012-07-05 MED ORDER — CEPHALEXIN 500 MG PO CAPS: 500.0000 mg | ORAL_CAPSULE | Freq: Four times a day (QID) | ORAL | Status: DC

## 2012-07-05 MED ORDER — FAMOTIDINE IN NACL 20-0.9 MG/50ML-% IV SOLN
20.0000 mg | Freq: Once | INTRAVENOUS | Status: AC
  Administered 2012-07-05: 20 mg via INTRAVENOUS
  Filled 2012-07-05: qty 50

## 2012-07-05 MED ORDER — OXYCODONE-ACETAMINOPHEN 5-325 MG PO TABS
2.0000 | ORAL_TABLET | Freq: Once | ORAL | Status: AC
  Administered 2012-07-05: 2 via ORAL
  Filled 2012-07-05: qty 2

## 2012-07-05 MED ORDER — DEXAMETHASONE SODIUM PHOSPHATE 10 MG/ML IJ SOLN
10.0000 mg | Freq: Once | INTRAMUSCULAR | Status: AC
  Administered 2012-07-05: 10 mg via INTRAVENOUS
  Filled 2012-07-05: qty 1

## 2012-07-05 MED ORDER — SODIUM CHLORIDE 0.9 % IV BOLUS (SEPSIS)
1000.0000 mL | Freq: Once | INTRAVENOUS | Status: AC
  Administered 2012-07-05: 1000 mL via INTRAVENOUS

## 2012-07-05 MED ORDER — DIPHENHYDRAMINE HCL 50 MG/ML IJ SOLN
25.0000 mg | Freq: Once | INTRAMUSCULAR | Status: AC
  Administered 2012-07-05: 25 mg via INTRAVENOUS
  Filled 2012-07-05: qty 1

## 2012-07-05 MED ORDER — ONDANSETRON HCL 4 MG/2ML IJ SOLN
4.0000 mg | Freq: Once | INTRAMUSCULAR | Status: AC
  Administered 2012-07-05: 4 mg via INTRAVENOUS
  Filled 2012-07-05: qty 2

## 2012-07-05 MED ORDER — FAMOTIDINE 20 MG PO TABS: 20.0000 mg | ORAL_TABLET | Freq: Two times a day (BID) | ORAL | Status: DC

## 2012-07-05 MED ORDER — DIPHENHYDRAMINE HCL 25 MG PO TABS: 25.0000 mg | ORAL_TABLET | Freq: Four times a day (QID) | ORAL | Status: DC

## 2012-07-05 NOTE — ED Provider Notes (Signed)
History     CSN: [id number]  Arrival date & time 07/05/12  1339   First MD Initiated Contact with Patient 07/05/12 1342      Chief Complaint  Patient presents with  . Pruritis  . Chills  . Dizziness    (Consider location/radiation/quality/duration/timing/severity/associated sxs/prior treatment) HPI Comments: Pt who had lumbar surgery with Dr Danielle Dess May 20, seen yesterday in his office dx with cellulitis surrounding surgical incision started on doxycycline yesterday.  Took 3 pills yesterday, this morning woke up with full body pruritis, after sleeping a few more hours, woke up feeling like she was about to pass out, states her vision would darken and she would almost pass out but she did not, states this happened several times. Pt also notes that 3-4 days ago she started developing pain and tingling in her left leg again, three days ago she had an episode of urinary incontinence, and today she started having pain and tingling in her right leg.  Pain is 5/10 intensity, and is similar to symptoms she had before the surgery.  States she did mention the left leg pain to Dr Danielle Dess but not the urinary incontinence.  Denies fevers but states she has had frequent chills.  Denies CP, SOB, dysuria, urinary frequency or urgency.   The history is provided by the patient.    Past Medical History  Diagnosis Date  . Ovarian cyst   . Diabetes mellitus without complication   . Hypothyroidism   . PONV (postoperative nausea and vomiting)   . Asthma     last attack 3 yrs ago  . Hypertension     dr Talmage Nap  . H/O hiatal hernia   . Goiter     Past Surgical History  Procedure Laterality Date  . Cholecystectomy    . Cesarean section    . Tonsillectomy    . Hernia repair    . Lipoma excision      L breast  . Tumor excision      Nerve sheath tumor, R arm  . Anterior lumbar fusion  12/18/2011    Procedure: ANTERIOR LUMBAR FUSION 1 LEVEL;  Surgeon: Barnett Abu, MD;  Location: [facility];  Service:  Neurosurgery;  Laterality: N/A;  Lumbar four-five Anterior Lumbar Interbody Fusion with Dr. Arbie Cookey to do anterior exposure  . Abdominal exposure  12/18/2011    Procedure: ABDOMINAL EXPOSURE;  Surgeon: Larina Earthly, MD;  Location: [facility];  Service: Vascular;  Laterality: N/A;  Anterior Expossure for anterior lumbar interbody fuson    Family History  Problem Relation Age of Onset  . Heart disease Father   . Lung cancer Father   . Colon cancer Maternal Aunt   . Pancreatic cancer Maternal Grandmother   . Pancreatic cancer Maternal Grandfather   . Pancreatic cancer Paternal Grandmother   . Cancer Paternal Grandfather     History  Substance Use Topics  . Smoking status: Never Smoker   . Smokeless tobacco: Never Used  . Alcohol Use: No    OB History   Grav Para Term Preterm Abortions TAB SAB Ect Mult Living   2 2              Review of Systems  Constitutional: Positive for chills. Negative for fever.  HENT: Negative for sore throat, mouth sores and trouble swallowing.   Eyes: Negative for visual disturbance.  Respiratory: Negative for shortness of breath, wheezing and stridor.   Cardiovascular: Negative for chest pain and palpitations.  Genitourinary:  Negative for dysuria, urgency and frequency.  Musculoskeletal: Positive for back pain.  Skin: Positive for color change.  Neurological: Positive for light-headedness. Negative for syncope, weakness and numbness.    Allergies  Erythromycin  Home Medications   Current Outpatient Rx  Name  Route  Sig  Dispense  Refill  . cetirizine (ZYRTEC) 10 MG tablet   Oral   Take 10 mg by mouth every evening.         . Cholecalciferol (VITAMIN D) 2000 UNITS CAPS   Oral   Take 1 capsule by mouth daily.         . cyclobenzaprine (FLEXERIL) 10 MG tablet   Oral   Take 5 mg by mouth at bedtime.         [name] doxycycline (VIBRAMYCIN) 100 MG capsule   Oral   Take 100 mg by mouth 2 (two) times daily.         [name] levonorgestrel  (MIRENA) 20 MCG/24HR IUD   Intrauterine   1 each by Intrauterine route once. Implanted end of April 2014         . levothyroxine (SYNTHROID, LEVOTHROID) 25 MCG tablet   Oral   Take 25 mcg by mouth daily.         [name] lisinopril (PRINIVIL,ZESTRIL) 2.5 MG tablet   Oral   Take 2.5 mg by mouth daily.         . metFORMIN (GLUCOPHAGE-XR) 500 MG 24 hr tablet   Oral   Take 500 mg by mouth every evening.          . methocarbamol (ROBAXIN) 500 MG tablet   Oral   Take 1 tablet (500 mg total) by mouth every 6 (six) hours as needed.   60 tablet   3   . Multiple Vitamin (MULTIVITAMIN WITH MINERALS) TABS   Oral   Take 1 tablet by mouth daily.         [name] oxyCODONE 10 MG TABS   Oral   Take 1 tablet (10 mg total) by mouth every 6 (six) hours as needed (for pain).   60 tablet   0   . OVER THE COUNTER MEDICATION   Oral   Take 2 tablets by mouth daily. Slim trim U 250 mg         . Oxycodone HCl 10 MG TABS   Oral   Take 10 mg by mouth every 6 (six) hours as needed (for pain).          . Phenylephrine-Acetaminophen (TYLENOL SINUS CONGESTION/PAIN PO)   Oral   Take 2 tablets by mouth daily as needed (for sinus congestion).            BP 113/71  Pulse 92  Temp(Src) 97.9 F (36.6 C) (Oral)  Resp 16  SpO2 100%  Physical Exam  Nursing note and vitals reviewed. Constitutional: She appears well-developed and well-nourished. No distress.  HENT:  Head: Normocephalic and atraumatic.  Neck: Neck supple.  Cardiovascular: Normal rate and regular rhythm.   Pulmonary/Chest: Effort normal and breath sounds normal. No respiratory distress. She has no wheezes. She has no rales.  Abdominal: Soft. Bowel sounds are normal. She exhibits no distension. There is no tenderness. There is no rebound and no guarding.  Genitourinary: Rectal exam shows external hemorrhoid. Rectal exam shows no mass, no tenderness and anal tone normal.  Musculoskeletal:  Lower extremities:  Strength 5/5,  sensation intact, distal pulses intact.     Neurological: She is alert.  Skin: No rash  noted. She is not diaphoretic.       ED Course  Procedures (including critical care time)  Labs Reviewed  CBC WITH DIFFERENTIAL  BASIC METABOLIC PANEL  URINALYSIS, ROUTINE W REFLEX MICROSCOPIC   No results found.  2:57 PM Discussed pt with Dr Anitra Lauth    Date: 07/05/2012  Rate: 89  Rhythm: normal sinus rhythm  QRS Axis: right  Intervals: normal  ST/T Wave abnormalities: normal  Conduction Disutrbances:none  Narrative Interpretation:   Old EKG Reviewed: changes noted  Reviewed EKG with Dr Anitra Lauth.   3:36 PM I have called Dr Verlee Rossetti office, spoke with his Diplomatic Services operational officer.    3:54 PM I spoke with Dr Danielle Dess.  Will add sed rate and crp.  If sed rate is normal, will switch pt to keflex from doxy, no further testing or imagining necessary. Otherwise, will call Dr Danielle Dess to discuss.    Discussed sed rate with Dr Danielle Dess who recommends decadron, 2g ancef, d/c home with keflex.   1. Allergic reaction caused by a drug     MDM  Pt with lumbar surgery May 20 and dx with cellulitis around surgical incision yesterday by Dr Danielle Dess, started on doxycycline p/w full body itching and presyncopy.  Pt likely allergic to doxycycline.   No airway involvement.States allergy to erythromycin was syncope.  Discussed with Dr Danielle Dess, pt treated according to our discussion.  Labs unremarkable, all discussed with Dr Danielle Dess.  See above.  Discussed all results with patient.  Pt given return precautions.  Pt verbalizes understanding and agrees with plan.           Trixie Dredge, PA-C 07/05/12 2110

## 2012-07-05 NOTE — ED Notes (Signed)
Per EMS: Pt her for itching, chills, dizziness. Nausea en route - given zofran.  Pt started doxycycline yesterday for staph infection post back surgery (surgery May 20) at [facility]. Pt was taking flexeril - d/c yesterday by her physician. Pre Diabetes. CBG 108. Thyroid disease - hypo - takes synthroid. Lisinopril for htn. Oxycodone for pain. Takes Robaxin. Has IUD. Allergic to e-mycin. No hives noted. Arms and legs itching when post surgery infection began.  Itching has moved up body.

## 2012-07-08 NOTE — ED Provider Notes (Signed)
Medical screening examination/treatment/procedure(s) were performed by non-physician practitioner and as supervising physician I was immediately available for consultation/collaboration.   Gwyneth Sprout, MD 07/08/12 [phone]

## 2012-07-15 ENCOUNTER — Other Ambulatory Visit: Payer: BC Managed Care – PPO

## 2012-09-19 ENCOUNTER — Other Ambulatory Visit: Payer: Self-pay | Admitting: Endocrinology

## 2012-09-19 DIAGNOSIS — E041 Nontoxic single thyroid nodule: Secondary | ICD-10-CM

## 2012-09-24 ENCOUNTER — Ambulatory Visit
Admission: RE | Admit: 2012-09-24 | Discharge: 2012-09-24 | Disposition: A | Discharge status: Home / Self Care | Payer: BC Managed Care – PPO | Source: Ambulatory Visit | Attending: Endocrinology | Admitting: Endocrinology

## 2012-09-24 DIAGNOSIS — E041 Nontoxic single thyroid nodule: Secondary | ICD-10-CM

## 2012-09-25 ENCOUNTER — Other Ambulatory Visit: Payer: BC Managed Care – PPO

## 2012-11-12 ENCOUNTER — Telehealth: Payer: Self-pay | Admitting: *Deleted

## 2012-11-12 ENCOUNTER — Telehealth: Payer: Self-pay

## 2012-11-12 DIAGNOSIS — Z1231 Encounter for screening mammogram for malignant neoplasm of breast: Secondary | ICD-10-CM

## 2012-11-12 NOTE — Telephone Encounter (Signed)
ORDER FOR MAMMO

## 2012-11-12 NOTE — Telephone Encounter (Signed)
Order placed for routine mammogram

## 2012-12-05 ENCOUNTER — Other Ambulatory Visit: Payer: Self-pay

## 2012-12-05 ENCOUNTER — Ambulatory Visit: Payer: BC Managed Care – PPO

## 2012-12-24 ENCOUNTER — Ambulatory Visit
Admission: RE | Admit: 2012-12-24 | Discharge: 2012-12-24 | Disposition: A | Discharge status: Home / Self Care | Payer: BC Managed Care – PPO | Source: Ambulatory Visit | Attending: Obstetrics & Gynecology | Admitting: Obstetrics & Gynecology

## 2012-12-24 DIAGNOSIS — Z1231 Encounter for screening mammogram for malignant neoplasm of breast: Secondary | ICD-10-CM

## 2013-03-03 ENCOUNTER — Ambulatory Visit: Payer: BC Managed Care – PPO | Admitting: Family Medicine

## 2013-03-18 ENCOUNTER — Ambulatory Visit: Payer: BC Managed Care – PPO | Admitting: Family Medicine

## 2013-03-19 ENCOUNTER — Encounter: Payer: Self-pay | Admitting: Family Medicine

## 2013-03-19 ENCOUNTER — Ambulatory Visit (INDEPENDENT_AMBULATORY_CARE_PROVIDER_SITE_OTHER): Payer: BC Managed Care – PPO | Admitting: Family Medicine

## 2013-03-19 VITALS — BP 120/76 | HR 84 | Temp 98.4°F | Ht 65.0 in | Wt 219.4 lb

## 2013-03-19 DIAGNOSIS — E039 Hypothyroidism, unspecified: Secondary | ICD-10-CM | POA: Insufficient documentation

## 2013-03-19 DIAGNOSIS — IMO0002 Reserved for concepts with insufficient information to code with codable children: Secondary | ICD-10-CM

## 2013-03-19 DIAGNOSIS — S32009K Unspecified fracture of unspecified lumbar vertebra, subsequent encounter for fracture with nonunion: Secondary | ICD-10-CM

## 2013-03-19 DIAGNOSIS — M069 Rheumatoid arthritis, unspecified: Secondary | ICD-10-CM

## 2013-03-19 DIAGNOSIS — E049 Nontoxic goiter, unspecified: Secondary | ICD-10-CM

## 2013-03-19 DIAGNOSIS — IMO0001 Reserved for inherently not codable concepts without codable children: Secondary | ICD-10-CM

## 2013-03-19 DIAGNOSIS — M797 Fibromyalgia: Secondary | ICD-10-CM

## 2013-03-19 DIAGNOSIS — E669 Obesity, unspecified: Secondary | ICD-10-CM | POA: Insufficient documentation

## 2013-03-19 NOTE — Progress Notes (Signed)
Patient ID: Michelle Mueller, female   DOB: [birth year], 48 y.o.   MRN: [id number]   Subjective:    Patient ID: Michelle Mueller, female    DOB: Aug 23, 1965, 48 y.o.   MRN: [id number] HPI Pt here to establish.  She sees Dr Ellene Route for her back and Dr Inge Rise for fibro and RA and Dr Maryjean Ka for pain management.    . Past Medical History  Diagnosis Date  . Ovarian cyst   . Diabetes mellitus without complication   . Hypothyroidism   . PONV (postoperative nausea and vomiting)   . Asthma     last attack 3 yrs ago  . Hypertension     [name]  . H/O hiatal hernia   . Goiter   . Arthritis   . Chicken pox    History   Social History  . Marital Status: Divorced    Spouse Name: N/A    Number of Children: N/A  . Years of Education: N/A   Occupational History  . Not on file.   Social History Main Topics  . Smoking status: Never Smoker   . Smokeless tobacco: Never Used  . Alcohol Use: No  . Drug Use: No  . Sexual Activity: Yes    Birth Control/ Protection: IUD   Other Topics Concern  . Not on file   Social History Narrative  . No narrative on file   Current Outpatient Prescriptions on File Prior to Visit  Medication Sig Dispense Refill  . Cholecalciferol (VITAMIN D) 2000 UNITS CAPS Take 1 capsule by mouth daily.      . cyclobenzaprine (FLEXERIL) 10 MG tablet Take 5 mg by mouth at bedtime.      [name] levonorgestrel (MIRENA) 20 MCG/24HR IUD 1 each by Intrauterine route once. Implanted end of April 2014      . levothyroxine (SYNTHROID, LEVOTHROID) 25 MCG tablet Take 25 mcg by mouth daily.      [name] lisinopril (PRINIVIL,ZESTRIL) 2.5 MG tablet Take 2.5 mg by mouth daily.      . metFORMIN (GLUCOPHAGE-XR) 500 MG 24 hr tablet Take 500 mg by mouth every evening.       . Multiple Vitamin (MULTIVITAMIN WITH MINERALS) TABS Take 1 tablet by mouth daily.      [name] OVER THE COUNTER MEDICATION Take 2 tablets by mouth daily. Slim trim U 250 mg      . oxyCODONE 10 MG TABS Take 1 tablet (10 mg total) by  mouth every 6 (six) hours as needed (for pain).  60 tablet  0  . Oxycodone HCl 10 MG TABS Take 10 mg by mouth every 6 (six) hours as needed (for pain).       . Phenylephrine-Acetaminophen (TYLENOL SINUS CONGESTION/PAIN PO) Take 2 tablets by mouth daily as needed (for sinus congestion).        No current facility-administered medications on file prior to visit.   Family History  Problem Relation Age of Onset  . Heart disease Father   . Lung cancer Father   . Colon cancer Maternal Aunt   . Pancreatic cancer Maternal Grandmother   . Pancreatic cancer Maternal Grandfather   . Pancreatic cancer Paternal Grandmother   . Cancer Paternal Grandfather          Objective:    BP 120/76  Pulse 84  Temp(Src) 98.4 F (36.9 C) (Oral)  Ht 5\' 5"  (1.651 m)  Wt 219 lb 6.4 oz (99.519 kg)  BMI 36.51 kg/m2  SpO2 97% General appearance: alert,  cooperative, appears stated age and no distress Throat: lips, mucosa, and tongue normal; teeth and gums normal Neck: no adenopathy, no carotid bruit, no JVD, supple, symmetrical, trachea midline and thyroid not enlarged, symmetric, no tenderness/mass/nodules Lungs: clear to auscultation bilaterally Heart: S1, S2 normal Extremities: extremities normal, atraumatic, no cyanosis or edema        Assessment & Plan:  1. Rheumatoid arthritis con't meds per rhem--- Dr Estanislado Pandy  2. Fibromyalgia Per rheum

## 2013-03-19 NOTE — Patient Instructions (Signed)

## 2013-03-19 NOTE — Assessment & Plan Note (Signed)
Per Neuro surgery and pain management

## 2013-03-19 NOTE — Progress Notes (Signed)
Pre visit review using our clinic review tool, if applicable. No additional management support is needed unless otherwise documented below in the visit note. 

## 2013-03-19 NOTE — Assessment & Plan Note (Signed)
Per endo== [name]

## 2013-03-19 NOTE — Assessment & Plan Note (Signed)
Per endo °

## 2013-03-26 ENCOUNTER — Telehealth: Payer: Self-pay | Admitting: *Deleted

## 2013-03-26 MED ORDER — GLUCOSE BLOOD VI STRP: ORAL_STRIP | Status: AC

## 2013-03-26 MED ORDER — ONETOUCH DELICA LANCETS 33G MISC: Status: AC

## 2013-03-26 NOTE — Telephone Encounter (Signed)
Patient called requesting refills for lancets and test strips for OneTouch Ultra Mini. Both were e-scribed to [employer]. JG//CMA

## 2013-04-14 ENCOUNTER — Ambulatory Visit
Admission: RE | Admit: 2013-04-14 | Discharge: 2013-04-14 | Disposition: A | Discharge status: Home / Self Care | Payer: BC Managed Care – PPO | Source: Ambulatory Visit | Attending: Family Medicine | Admitting: Family Medicine

## 2013-04-14 DIAGNOSIS — IMO0002 Reserved for concepts with insufficient information to code with codable children: Secondary | ICD-10-CM

## 2013-04-16 ENCOUNTER — Ambulatory Visit (HOSPITAL_BASED_OUTPATIENT_CLINIC_OR_DEPARTMENT_OTHER): Payer: BC Managed Care – PPO | Attending: Rheumatology | Admitting: Radiology

## 2013-04-16 VITALS — Ht 65.0 in | Wt 223.0 lb

## 2013-04-16 DIAGNOSIS — G4730 Sleep apnea, unspecified: Principal | ICD-10-CM

## 2013-04-16 DIAGNOSIS — G4700 Insomnia, unspecified: Secondary | ICD-10-CM

## 2013-04-16 DIAGNOSIS — R5383 Other fatigue: Secondary | ICD-10-CM

## 2013-04-16 DIAGNOSIS — R5381 Other malaise: Secondary | ICD-10-CM

## 2013-04-16 DIAGNOSIS — G4710 Hypersomnia, unspecified: Secondary | ICD-10-CM | POA: Insufficient documentation

## 2013-04-19 DIAGNOSIS — G4700 Insomnia, unspecified: Secondary | ICD-10-CM

## 2013-04-19 DIAGNOSIS — R5381 Other malaise: Secondary | ICD-10-CM

## 2013-04-19 DIAGNOSIS — R5383 Other fatigue: Secondary | ICD-10-CM

## 2013-04-19 NOTE — Sleep Study (Signed)
   NAME: Michelle Mueller DATE OF BIRTH:  [birth year] MEDICAL RECORD NUMBER [id number]  LOCATION: [facility]  PHYSICIAN: Montario Zilka D  DATE OF STUDY: 04/16/2013  SLEEP STUDY TYPE: Nocturnal Polysomnogram               REFERRING PHYSICIAN: Christoper Allegra, MD  INDICATION FOR STUDY: Hypersomnia with sleep apnea  EPWORTH SLEEPINESS SCORE:   12/24 HEIGHT: 5\' 5"  (165.1 cm)  WEIGHT: 223 lb (101.152 kg)    Body mass index is 37.11 kg/(m^2).  NECK SIZE: 15 in.  MEDICATIONS: Charted for review  SLEEP ARCHITECTURE: Total sleep time 266 minutes with sleep efficiency 60.4%. Stage I was 22.4%, stage II 69.7%, stage III absent, REM 7.9% of total sleep time. Sleep latency 93 minutes, REM latency 264.5 minutes, awake after sleep onset 83 minutes, arousal index 16. Bedtime medication: Oxycodone  RESPIRATORY DATA: Apnea hypopnea index (AHI) 2.7 per hour. 12 total events scored including one central apnea and 11 hypopneas. All events were nonsupine. REM AHI 31.4 per hour. CPAP titration was not done.  OXYGEN DATA: Moderately loud snoring with oxygen desaturation to a nadir of 84% and mean oxygen saturation through the study of 92.8% on room air.  CARDIAC DATA: Sinus rhythm with occasional PVC and PAC  MOVEMENT/PARASOMNIA: No significant movement disturbance scored, bathroom x1. On video review, frequent leg jerks were noted but these seemed associated primarily with intervals of wakefulness.  IMPRESSION/ RECOMMENDATION:   1) Difficulty initiating and maintaining sleep. Sustained sleep was not achieved until around 1 AM. Management as insomnia may be helpful.  2) Occasional respiratory events with sleep disturbance, within normal limits. AHI 2.7 per hour (the normal range for adults is an AHI from 0-5 events per hour). Moderately loud snoring with oxygen desaturation to a nadir of 84% and mean oxygen saturation through the study of 92.8% on room air.  Signed Baird Lyons  M.D. Deneise Lever Diplomate, American Board of Sleep Medicine  ELECTRONICALLY SIGNED ON:  04/19/2013, 10:49 AM [place] PH: [phone]   FX: [phone] [place]

## 2013-04-25 ENCOUNTER — Telehealth: Payer: Self-pay | Admitting: Family Medicine

## 2013-04-25 NOTE — Telephone Encounter (Signed)
Patient called concerning her bone density results and also did we send a copy to her rheumatologist. Please advise

## 2013-04-28 NOTE — Telephone Encounter (Signed)
Spoke with patient and advised of bone density results. Patient also advised to take Calcium 1000 mg daily along with Vit D. Patient verbalized understanding on plan.

## 2013-04-28 NOTE — Telephone Encounter (Signed)
04/28/13  Pt returned phone call.  Please call back.

## 2013-04-28 NOTE — Telephone Encounter (Signed)
Attempted to call pt back with Bone Density results. lmovm to return call. Letter mailed with results.

## 2013-05-08 ENCOUNTER — Other Ambulatory Visit: Payer: Self-pay

## 2013-06-13 ENCOUNTER — Encounter: Payer: Self-pay | Admitting: Family Medicine

## 2013-06-13 ENCOUNTER — Ambulatory Visit (INDEPENDENT_AMBULATORY_CARE_PROVIDER_SITE_OTHER): Payer: BC Managed Care – PPO | Admitting: Family Medicine

## 2013-06-13 VITALS — BP 126/88 | HR 90 | Temp 98.5°F | Wt 228.4 lb

## 2013-06-13 DIAGNOSIS — IMO0001 Reserved for inherently not codable concepts without codable children: Secondary | ICD-10-CM

## 2013-06-13 DIAGNOSIS — M797 Fibromyalgia: Secondary | ICD-10-CM | POA: Insufficient documentation

## 2013-06-13 DIAGNOSIS — M069 Rheumatoid arthritis, unspecified: Secondary | ICD-10-CM

## 2013-06-13 DIAGNOSIS — F3289 Other specified depressive episodes: Secondary | ICD-10-CM

## 2013-06-13 DIAGNOSIS — E119 Type 2 diabetes mellitus without complications: Secondary | ICD-10-CM

## 2013-06-13 DIAGNOSIS — F329 Major depressive disorder, single episode, unspecified: Secondary | ICD-10-CM

## 2013-06-13 DIAGNOSIS — F322 Major depressive disorder, single episode, severe without psychotic features: Secondary | ICD-10-CM | POA: Insufficient documentation

## 2013-06-13 DIAGNOSIS — E11620 Type 2 diabetes mellitus with diabetic dermatitis: Secondary | ICD-10-CM | POA: Insufficient documentation

## 2013-06-13 MED ORDER — VENLAFAXINE HCL ER 37.5 MG PO CP24: 37.5000 mg | ORAL_CAPSULE | Freq: Every day | ORAL | Status: DC

## 2013-06-13 NOTE — Progress Notes (Signed)
Pre visit review using our clinic review tool, if applicable. No additional management support is needed unless otherwise documented below in the visit note. 

## 2013-06-13 NOTE — Progress Notes (Signed)
   Subjective:    Patient ID: Michelle Mueller, female    DOB: 1965/05/20, 48 y.o.   MRN: [id number]  HPI Pt here c/o severe depression secondary to her relationship with her mom.  She is also looking for a different rheum.  She needs one that will treat fibro and RA. Pt states she is not suicidal or homicidal.     Review of Systems As above    Objective:   Physical Exam BP 126/88  Pulse 90  Temp(Src) 98.5 F (36.9 C) (Oral)  Wt 228 lb 6.4 oz (103.602 kg)  SpO2 95% General appearance: alert, cooperative, appears stated age and no distress Nose: Nares normal. Septum midline. Mucosa normal. No drainage or sinus tenderness. Throat: lips, mucosa, and tongue normal; teeth and gums normal Neck: no adenopathy, no carotid bruit, no JVD, supple, symmetrical, trachea midline and thyroid not enlarged, symmetric, no tenderness/mass/nodules Lungs: clear to auscultation bilaterally Heart: regular rate and rhythm, S1, S2 normal, no murmur, click, rub or gallop Extremities: extremities normal, atraumatic, no cyanosis or edema Neurologic: Mental status: Alert, oriented, thought content appropriate, when questioned about suicide, the patient expresses no suicidal ideation, no homicidal ideation        Assessment & Plan:  1. Fibromyalgia muscle pain  - Ambulatory referral to Rheumatology  2. Rheumatoid arthritis(714.0)  - Ambulatory referral to Rheumatology  3. Severe depression  - venlafaxine XR (EFFEXOR XR) 37.5 MG 24 hr capsule; Take 1 capsule (37.5 mg total) by mouth daily with breakfast. 1 po qd x1 week then 2 po qd  Dispense: 30 capsule; Refill: 0  4. Diabetes mellitus, type II Per enod

## 2013-06-13 NOTE — Patient Instructions (Signed)

## 2013-07-11 ENCOUNTER — Ambulatory Visit: Payer: BC Managed Care – PPO | Admitting: Family Medicine

## 2013-07-11 ENCOUNTER — Other Ambulatory Visit: Payer: Self-pay | Admitting: Family Medicine

## 2013-07-15 ENCOUNTER — Ambulatory Visit (INDEPENDENT_AMBULATORY_CARE_PROVIDER_SITE_OTHER): Payer: BC Managed Care – PPO | Admitting: Family Medicine

## 2013-07-15 ENCOUNTER — Encounter: Payer: Self-pay | Admitting: Family Medicine

## 2013-07-15 VITALS — BP 110/72 | HR 87 | Temp 97.8°F | Wt 219.8 lb

## 2013-07-15 DIAGNOSIS — M797 Fibromyalgia: Secondary | ICD-10-CM

## 2013-07-15 DIAGNOSIS — E039 Hypothyroidism, unspecified: Secondary | ICD-10-CM

## 2013-07-15 DIAGNOSIS — E785 Hyperlipidemia, unspecified: Secondary | ICD-10-CM

## 2013-07-15 DIAGNOSIS — E1149 Type 2 diabetes mellitus with other diabetic neurological complication: Secondary | ICD-10-CM

## 2013-07-15 DIAGNOSIS — IMO0001 Reserved for inherently not codable concepts without codable children: Secondary | ICD-10-CM

## 2013-07-15 LAB — BASIC METABOLIC PANEL
BUN: 12 mg/dL (ref 6–23)
CALCIUM: 9.7 mg/dL (ref 8.4–10.5)
CO2: 28 meq/L (ref 19–32)
CREATININE: 1.1 mg/dL (ref 0.4–1.2)
Chloride: 105 mEq/L (ref 96–112)
GFR: 57.35 mL/min — AB (ref >60.00)
GLUCOSE: 102 mg/dL — AB (ref 70–99)
Potassium: 4.2 mEq/L (ref 3.5–5.1)
SODIUM: 139 meq/L (ref 135–145)

## 2013-07-15 LAB — CBC WITH DIFFERENTIAL/PLATELET
BASOS ABS: 0 10*3/uL (ref 0.0–0.1)
Basophils Relative: 0.4 % (ref 0.0–3.0)
EOS ABS: 0.2 10*3/uL (ref 0.0–0.7)
Eosinophils Relative: 2.1 % (ref 0.0–5.0)
HCT: 43 % (ref 36.0–46.0)
HEMOGLOBIN: 14.1 g/dL (ref 12.0–15.0)
LYMPHS PCT: 44.7 % (ref 12.0–46.0)
Lymphs Abs: 3.4 10*3/uL (ref 0.7–4.0)
MCHC: 32.8 g/dL (ref 30.0–36.0)
MCV: 88.3 fl (ref 78.0–100.0)
MONOS PCT: 8.1 % (ref 3.0–12.0)
Monocytes Absolute: 0.6 10*3/uL (ref 0.1–1.0)
NEUTROS ABS: 3.4 10*3/uL (ref 1.4–7.7)
NEUTROS PCT: 44.7 % (ref 43.0–77.0)
Platelets: 325 10*3/uL (ref 150.0–400.0)
RBC: 4.88 Mil/uL (ref 3.87–5.11)
RDW: 14.2 % (ref 11.5–15.5)
WBC: 7.6 10*3/uL (ref 4.0–10.5)

## 2013-07-15 LAB — HEPATIC FUNCTION PANEL
ALK PHOS: 58 U/L (ref 39–117)
ALT: 21 U/L (ref 0–35)
AST: 22 U/L (ref 0–37)
Albumin: 4.3 g/dL (ref 3.5–5.2)
BILIRUBIN DIRECT: 0 mg/dL (ref 0.0–0.3)
BILIRUBIN TOTAL: 0.4 mg/dL (ref 0.2–1.2)
Total Protein: 7.3 g/dL (ref 6.0–8.3)

## 2013-07-15 LAB — LIPID PANEL
CHOL/HDL RATIO: 4
Cholesterol: 210 mg/dL — ABNORMAL HIGH (ref 0–200)
HDL: 58.7 mg/dL (ref >39.00)
LDL CALC: 138 mg/dL — AB (ref 0–99)
NONHDL: 151.3
TRIGLYCERIDES: 66 mg/dL (ref 0.0–149.0)
VLDL: 13.2 mg/dL (ref 0.0–40.0)

## 2013-07-15 LAB — HEMOGLOBIN A1C: HEMOGLOBIN A1C: 5.1 % (ref 4.6–6.5)

## 2013-07-15 LAB — TSH: TSH: 2.41 u[IU]/mL (ref 0.35–4.50)

## 2013-07-15 LAB — MICROALBUMIN / CREATININE URINE RATIO
CREATININE, U: 502.5 mg/dL
Microalb Creat Ratio: 0.9 mg/g (ref 0.0–30.0)
Microalb, Ur: 4.4 mg/dL — ABNORMAL HIGH (ref 0.0–1.9)

## 2013-07-15 MED ORDER — VENLAFAXINE HCL ER 75 MG PO CP24: 75.0000 mg | ORAL_CAPSULE | Freq: Every day | ORAL | Status: DC

## 2013-07-15 MED ORDER — MILNACIPRAN HCL 12.5 & 25 & 50 MG PO MISC: ORAL | Status: DC

## 2013-07-15 NOTE — Progress Notes (Signed)
Pre visit review using our clinic review tool, if applicable. No additional management support is needed unless otherwise documented below in the visit note. 

## 2013-07-15 NOTE — Patient Instructions (Signed)

## 2013-07-15 NOTE — Progress Notes (Signed)
   Subjective:    Patient ID: Michelle Mueller, female    DOB: [birth year], 48 y.o.   MRN: [id number]  HPI    Review of Systems  Constitutional: Negative for diaphoresis, appetite change, fatigue and unexpected weight change.  Eyes: Negative for pain, redness and visual disturbance.  Respiratory: Negative for cough, chest tightness, shortness of breath and wheezing.   Cardiovascular: Negative for chest pain, palpitations and leg swelling.  Endocrine: Negative for cold intolerance, heat intolerance, polydipsia, polyphagia and polyuria.  Genitourinary: Negative for dysuria, frequency and difficulty urinating.  Neurological: Negative for dizziness, light-headedness, numbness and headaches.       Objective:   Physical Exam  Constitutional: She is oriented to person, place, and time. She appears well-developed and well-nourished.  HENT:  Head: Normocephalic and atraumatic.  Eyes: Conjunctivae and EOM are normal.  Neck: Normal range of motion. Neck supple. No JVD present. Carotid bruit is not present. No thyromegaly present.  Cardiovascular: Normal rate, regular rhythm and normal heart sounds.   No murmur heard. Pulmonary/Chest: Effort normal and breath sounds normal. No respiratory distress. She has no wheezes. She has no rales. She exhibits no tenderness.  Musculoskeletal: She exhibits no edema.  Neurological: She is alert and oriented to person, place, and time.  Psychiatric: She has a normal mood and affect.          Assessment & Plan:  1. Type II or unspecified type diabetes mellitus with neurological manifestations, not stated as uncontrolled Check labs and con't meds - Basic metabolic panel - CBC with Differential - Hemoglobin A1c - POCT urinalysis dipstick - Microalbumin / creatinine urine ratio  2. Other and unspecified hyperlipidemia Check labs and con' tmeds - CBC with Differential - Hepatic function panel - Lipid panel - POCT urinalysis dipstick - Microalbumin /  creatinine urine ratio  3. Hypothyroid Check labs - CBC with Differential - TSH  4. Fibromyalgia Pt to f/u rheum - Milnacipran HCl 12.5 & 25 & 50 MG MISC; As directed  Dispense: 1 each; Refill: 0

## 2013-07-16 LAB — POCT URINALYSIS DIPSTICK
Bilirubin, UA: NEGATIVE
GLUCOSE UA: NEGATIVE
Ketones, UA: NEGATIVE
Leukocytes, UA: NEGATIVE
NITRITE UA: NEGATIVE
PH UA: 6
Protein, UA: NEGATIVE
RBC UA: NEGATIVE
UROBILINOGEN UA: 0.2

## 2013-07-21 ENCOUNTER — Telehealth: Payer: Self-pay | Admitting: *Deleted

## 2013-07-21 DIAGNOSIS — E785 Hyperlipidemia, unspecified: Secondary | ICD-10-CM

## 2013-07-21 DIAGNOSIS — E119 Type 2 diabetes mellitus without complications: Secondary | ICD-10-CM

## 2013-07-21 NOTE — Telephone Encounter (Signed)
Caller name:  Kenza Relation to pt:  self Call back number: [phone]  Pharmacy:  Reason for call:   Pt would like to discuss her lab results from 07/15/2013.  Please call.  bw

## 2013-07-21 NOTE — Telephone Encounter (Signed)
Spoke with patient she stated she wanted to wait 3 mos before starting the medication because she wanted to see what she can do with changing her diet first. Dr.Lowne is ok with this, she was also on Methotrexate and rheumatologist was supposed to had been monitoring her but was not, she has been off for the last 3 weeks. Per Dr.Lowne Ok to recheck in 3 mos per Dr.Lowne. Patient voiced understanding and agreed, apt scheduled.     KP

## 2013-07-21 NOTE — Telephone Encounter (Signed)
Notes Recorded by Rosalita Chessman, DO on 07/16/2013 at 8:06 PM Cholesterol--- LDL goal < 70, HDL >40, TG < 150. Diet and exercise will increase HDL and decrease LDL and TG. Fish, Fish Oil, Flaxseed oil will also help increase the HDL and decrease Triglycerides. Recheck labs in 3 months---272.4 Lipid, hep. Start zocor 20 mg #30 1 po qhs, 2 refills  + microalbumin elevated--- check 24 h urine protein 250.00 Hgba1c, bmp

## 2013-07-22 ENCOUNTER — Telehealth: Payer: Self-pay | Admitting: Family Medicine

## 2013-07-22 MED ORDER — MELOXICAM 7.5 MG PO TABS
7.5000 mg | ORAL_TABLET | Freq: Two times a day (BID) | ORAL | Status: DC
Start: 2013-07-22 — End: 2013-08-28

## 2013-07-22 NOTE — Addendum Note (Signed)
Addended by: Ewing Schlein on: 07/22/2013 04:34 PM   Modules accepted: Orders

## 2013-07-22 NOTE — Telephone Encounter (Signed)
Caller name: Michelle Mueller Relation to pt: patient Call back number: [phone] Pharmacy:[id number] - [place], [place] - [address]   Reason for call: to request a rx for Meloxicam. Patient states that she is not seeing her Rheumatologist anymore for her rheumatoid arthritis. She is currently in the process of getting a new Rheumatologist. Please advise.

## 2013-07-22 NOTE — Telephone Encounter (Signed)
Rx Meloxicam 7.5 mg sent to the pharmacy quantity #60 with 5 refills.    KP

## 2013-07-22 NOTE — Telephone Encounter (Signed)
To MD for review     KP 

## 2013-07-22 NOTE — Telephone Encounter (Signed)
Ok to fill for 6 months  

## 2013-08-04 ENCOUNTER — Ambulatory Visit (INDEPENDENT_AMBULATORY_CARE_PROVIDER_SITE_OTHER): Payer: BC Managed Care – PPO | Admitting: Family Medicine

## 2013-08-04 ENCOUNTER — Encounter: Payer: Self-pay | Admitting: Family Medicine

## 2013-08-04 VITALS — BP 108/80 | HR 127 | Temp 98.2°F | Wt 218.6 lb

## 2013-08-04 DIAGNOSIS — N390 Urinary tract infection, site not specified: Secondary | ICD-10-CM | POA: Insufficient documentation

## 2013-08-04 DIAGNOSIS — N3000 Acute cystitis without hematuria: Secondary | ICD-10-CM

## 2013-08-04 MED ORDER — CEPHALEXIN 500 MG PO CAPS: 500.0000 mg | ORAL_CAPSULE | Freq: Two times a day (BID) | ORAL | Status: AC

## 2013-08-04 MED ORDER — ONDANSETRON HCL 4 MG PO TABS: 4.0000 mg | ORAL_TABLET | Freq: Three times a day (TID) | ORAL | Status: DC | PRN

## 2013-08-04 NOTE — Progress Notes (Signed)
   Subjective:    Patient ID: Michelle Mueller, female    DOB: [birth year], 48 y.o.   MRN: [id number]  Urinary Tract Infection    UTI- pt reports hx of similar.  sxs started last night.  + suprapubic pressure/pain but no dysuria.  + nausea.  Increased fatigue- sleeping 18-20 hrs daily.  Increased frequency, hesitancy, urgency.  Started AZO last night.  No hematuria.   Review of Systems For ROS see HPI     Objective:   Physical Exam  Vitals reviewed. Constitutional: She appears well-developed and well-nourished. No distress.  Abdominal: Soft. She exhibits no distension. There is tenderness (+ suprapubic tenderness but no CVA tenderness).          Assessment & Plan:

## 2013-08-04 NOTE — Assessment & Plan Note (Signed)
Recurrent problem for pt.  No evidence of pyelo or renal stone on PE (no CVA tenderness, no hematuria).  Unable to do UA due to AZO.  Send urine for cx.  tx empirically w/ Keflex.  Zofran prn.  Reviewed supportive care and red flags that should prompt return.  Pt expressed understanding and is in agreement w/ plan.

## 2013-08-04 NOTE — Progress Notes (Signed)
Pre visit review using our clinic review tool, if applicable. No additional management support is needed unless otherwise documented below in the visit note. 

## 2013-08-04 NOTE — Patient Instructions (Signed)
Follow up as needed We'll notify you of your urine culture results Drink plenty of fluids Continue AZO for pain and spasm Use the Zofran as needed for nausea Call with any questions or concerns Hang in there!!

## 2013-08-06 ENCOUNTER — Telehealth: Payer: Self-pay | Admitting: Family Medicine

## 2013-08-06 LAB — URINE CULTURE
COLONY COUNT: NO GROWTH
ORGANISM ID, BACTERIA: NO GROWTH

## 2013-08-06 NOTE — Telephone Encounter (Signed)
Pt notified and states that she is going to hold off on the urologist appt. States that she is going to have a follow up appt with Lowne to complete a 24 hour urine.

## 2013-08-06 NOTE — Telephone Encounter (Signed)
Caller name: Hero  Call back number:[phone]   Reason for call:   Pt saw Dr. Birdie Riddle on 7/6 for possible UTI.  Pt states that lab work came back negative for UTI, but she is still having the symptoms of a UTI. Wants to know if she should continue Antibiotic

## 2013-08-06 NOTE — Telephone Encounter (Signed)
Can stop abx.  She may be having interstitial cystitis (inflammation and discomfort w/o infection) and needs a referral to urology for evaluation

## 2013-08-28 ENCOUNTER — Encounter: Payer: Self-pay | Admitting: Family Medicine

## 2013-08-28 ENCOUNTER — Ambulatory Visit (INDEPENDENT_AMBULATORY_CARE_PROVIDER_SITE_OTHER): Payer: BC Managed Care – PPO | Admitting: Family Medicine

## 2013-08-28 VITALS — BP 128/100 | HR 84 | Temp 98.5°F | Wt 217.8 lb

## 2013-08-28 DIAGNOSIS — IMO0001 Reserved for inherently not codable concepts without codable children: Secondary | ICD-10-CM

## 2013-08-28 DIAGNOSIS — H65199 Other acute nonsuppurative otitis media, unspecified ear: Secondary | ICD-10-CM

## 2013-08-28 DIAGNOSIS — H65193 Other acute nonsuppurative otitis media, bilateral: Secondary | ICD-10-CM

## 2013-08-28 DIAGNOSIS — R809 Proteinuria, unspecified: Secondary | ICD-10-CM

## 2013-08-28 DIAGNOSIS — R030 Elevated blood-pressure reading, without diagnosis of hypertension: Secondary | ICD-10-CM

## 2013-08-28 DIAGNOSIS — J029 Acute pharyngitis, unspecified: Secondary | ICD-10-CM

## 2013-08-28 LAB — MICROALBUMIN / CREATININE URINE RATIO
Creatinine,U: 124.5 mg/dL
MICROALB/CREAT RATIO: 0.4 mg/g (ref 0.0–30.0)
Microalb, Ur: 0.5 mg/dL (ref 0.0–1.9)

## 2013-08-28 LAB — POCT RAPID STREP A (OFFICE): Rapid Strep A Screen: NEGATIVE

## 2013-08-28 MED ORDER — FLUTICASONE PROPIONATE 50 MCG/ACT NA SUSP: 2.0000 | Freq: Every day | NASAL | Status: DC

## 2013-08-28 MED ORDER — NONFORMULARY OR COMPOUNDED ITEM: Status: DC

## 2013-08-28 MED ORDER — CEFUROXIME AXETIL 500 MG PO TABS: 500.0000 mg | ORAL_TABLET | Freq: Two times a day (BID) | ORAL | Status: AC

## 2013-08-28 NOTE — Progress Notes (Signed)
Pre visit review using our clinic review tool, if applicable. No additional management support is needed unless otherwise documented below in the visit note. 

## 2013-08-28 NOTE — Progress Notes (Signed)
  Subjective:     Michelle Mueller is a 48 y.o. female who presents for evaluation of sore throat. Associated symptoms include fevers up to 100.4 degrees, pain while swallowing, sore throat and swollen glands. Onset of symptoms was 3 days ago, and have been gradually worsening since that time. She is drinking plenty of fluids. She has not had a recent close exposure to someone with proven streptococcal pharyngitis.  The following portions of the patient's history were reviewed and updated as appropriate: allergies, current medications, past family history, past medical history, past social history, past surgical history and problem list.  Review of Systems Pertinent items are noted in HPI.    Objective:    BP 136/100  Pulse 84  Temp(Src) 98.5 F (36.9 C) (Oral)  Wt 217 lb 12.8 oz (98.793 kg)  SpO2 98% General appearance: alert, cooperative, appears stated age and no distress Ears: abnormal TM right ear - diminished mobility, erythematous and bulging and abnormal TM left ear - diminished mobility, erythematous and bulging Nose: Nares normal. Septum midline. Mucosa normal. No drainage or sinus tenderness. Throat: abnormal findings: marked oropharyngeal erythema Neck: moderate anterior cervical adenopathy, supple, symmetrical, trachea midline and thyroid not enlarged, symmetric, no tenderness/mass/nodules Lungs: clear to auscultation bilaterally Heart: S1, S2 normal Lymph nodes: Cervical adenopathy: b/l  Laboratory Strep test done. Results:negative.    Assessment:     B/L otitis media.    Plan:    Patient placed on antibiotics. Use of OTC analgesics recommended as well as salt water gargles. Follow up as needed.   otc antihistamine and steroid nasal spray

## 2013-08-28 NOTE — Patient Instructions (Signed)

## 2013-09-30 NOTE — Addendum Note (Signed)
Addended by: Harl Bowie on: 09/30/2013 04:14 PM   Modules accepted: Orders

## 2013-10-01 LAB — PROTEIN, URINE, 24 HOUR
Protein, 24H Urine: 105 mg/d (ref <150)
Protein, Urine: 5 mg/dL (ref 5–24)

## 2013-10-09 ENCOUNTER — Telehealth: Payer: Self-pay | Admitting: Family Medicine

## 2013-10-09 NOTE — Telephone Encounter (Signed)
TSH was done 07/15/13, I made the patient aware and advised I could send the result to [facility]. She said [facility] and  if she needs it done sooner she would call and let us know.      KP

## 2013-10-09 NOTE — Telephone Encounter (Signed)
Pt has appt scheduled for labs on 11/18/13 and wants to know if dr. Etter Sjogren can check her TSH as well, pt states she see her endocrinologist in dec and instead of her having to repeat labs again, if she has the TSH done then she can request dr. Etter Sjogren to send a copy of her labs to dr. Lenise Arena.

## 2013-10-20 ENCOUNTER — Other Ambulatory Visit: Payer: BC Managed Care – PPO

## 2013-10-24 ENCOUNTER — Telehealth: Payer: Self-pay

## 2013-10-24 NOTE — Telephone Encounter (Signed)
Message copied by Rudene Anda on Fri Oct 24, 2013  9:14 AM ------      Message from: Rosalita Chessman      Created: Thu Oct 23, 2013  5:24 PM       Dr at [facility] wanted pt to increase neurontin to 300 mg bid x 1-2 weeks then tid x 4 weeks      Did he discuss this with the patient?  ------

## 2013-10-24 NOTE — Telephone Encounter (Signed)
Pt stated that the doctor at [facility] did discuss the increase noted below, however, her pain management doctor stated that he wanted her to increase the Neurontin slowly and has her on a schedule to do so.  She is currently taking Neurontin 100 mg in the morning and 300 mg at night.  She has an appointment with her rheumatologist on Oct 13th and pain management on Oct 14th.

## 2013-10-24 NOTE — Telephone Encounter (Signed)
Ok great.

## 2013-11-18 ENCOUNTER — Other Ambulatory Visit (INDEPENDENT_AMBULATORY_CARE_PROVIDER_SITE_OTHER): Payer: BC Managed Care – PPO

## 2013-11-18 DIAGNOSIS — E785 Hyperlipidemia, unspecified: Secondary | ICD-10-CM

## 2013-11-18 DIAGNOSIS — E119 Type 2 diabetes mellitus without complications: Secondary | ICD-10-CM

## 2013-11-18 LAB — BASIC METABOLIC PANEL
BUN: 14 mg/dL (ref 6–23)
CALCIUM: 8.6 mg/dL (ref 8.4–10.5)
CO2: 27 meq/L (ref 19–32)
Chloride: 106 mEq/L (ref 96–112)
Creatinine, Ser: 1 mg/dL (ref 0.4–1.2)
GFR: 62.53 mL/min (ref >60.00)
Glucose, Bld: 78 mg/dL (ref 70–99)
Potassium: 4.5 mEq/L (ref 3.5–5.1)
SODIUM: 139 meq/L (ref 135–145)

## 2013-11-18 LAB — LIPID PANEL
CHOL/HDL RATIO: 4
Cholesterol: 184 mg/dL (ref 0–200)
HDL: 49.1 mg/dL (ref >39.00)
LDL Cholesterol: 103 mg/dL — ABNORMAL HIGH (ref 0–99)
NONHDL: 134.9
Triglycerides: 159 mg/dL — ABNORMAL HIGH (ref 0.0–149.0)
VLDL: 31.8 mg/dL (ref 0.0–40.0)

## 2013-11-18 LAB — HEPATIC FUNCTION PANEL
ALT: 14 U/L (ref 0–35)
AST: 14 U/L (ref 0–37)
Albumin: 3.1 g/dL — ABNORMAL LOW (ref 3.5–5.2)
Alkaline Phosphatase: 51 U/L (ref 39–117)
BILIRUBIN TOTAL: 0.6 mg/dL (ref 0.2–1.2)
Bilirubin, Direct: 0 mg/dL (ref 0.0–0.3)
Total Protein: 6.7 g/dL (ref 6.0–8.3)

## 2013-11-18 LAB — HEMOGLOBIN A1C: Hgb A1c MFr Bld: 5.3 % (ref 4.6–6.5)

## 2013-11-24 ENCOUNTER — Other Ambulatory Visit: Payer: Self-pay

## 2013-11-24 MED ORDER — SIMVASTATIN 20 MG PO TABS: 20.0000 mg | ORAL_TABLET | Freq: Every day | ORAL | Status: DC

## 2013-12-01 ENCOUNTER — Encounter: Payer: Self-pay | Admitting: Family Medicine

## 2013-12-19 ENCOUNTER — Telehealth: Payer: Self-pay | Admitting: Family Medicine

## 2013-12-19 MED ORDER — ATORVASTATIN CALCIUM 10 MG PO TABS: 10.0000 mg | ORAL_TABLET | Freq: Every day | ORAL | Status: DC

## 2013-12-19 NOTE — Telephone Encounter (Signed)
lipitor 10 mg #30  1 po qd , 2 refills

## 2013-12-19 NOTE — Telephone Encounter (Signed)
Please advise      KP 

## 2013-12-19 NOTE — Telephone Encounter (Signed)
Caller name: Shy, [place] Relation to pt: self  Call back number: [phone] [facility]acy: [facility] [phone]  Reason for call:  pt states simvastatin (ZOCOR) 20 MG tablet is breaking her out pt stop taking rx requesting a new med generic and cheap.

## 2013-12-19 NOTE — Telephone Encounter (Signed)
Rx faxed.    KP 

## 2014-01-01 ENCOUNTER — Encounter: Payer: Self-pay | Admitting: Family Medicine

## 2014-01-01 ENCOUNTER — Ambulatory Visit (INDEPENDENT_AMBULATORY_CARE_PROVIDER_SITE_OTHER): Payer: BC Managed Care – PPO | Admitting: Family Medicine

## 2014-01-01 VITALS — BP 148/85 | HR 78 | Temp 98.1°F | Wt 220.8 lb

## 2014-01-01 DIAGNOSIS — R011 Cardiac murmur, unspecified: Secondary | ICD-10-CM

## 2014-01-01 DIAGNOSIS — I10 Essential (primary) hypertension: Secondary | ICD-10-CM

## 2014-01-01 DIAGNOSIS — R010 Benign and innocent cardiac murmurs: Secondary | ICD-10-CM

## 2014-01-01 MED ORDER — LISINOPRIL 5 MG PO TABS: 5.0000 mg | ORAL_TABLET | Freq: Every day | ORAL | Status: DC

## 2014-01-01 NOTE — Progress Notes (Signed)
Pre visit review using our clinic review tool, if applicable. No additional management support is needed unless otherwise documented below in the visit note. 

## 2014-01-01 NOTE — Patient Instructions (Signed)

## 2014-01-01 NOTE — Progress Notes (Signed)
Subjective:    Michelle Mueller is a 48 y.o. female who presents for evaluation of elevated blood pressures. Age at onset of elevated blood pressure:  48. Cardiac symptoms: chest pain. Patient denies: claudication, dyspnea, exertional chest pressure/discomfort, fatigue, irregular heart beat, lower extremity edema, near-syncope, orthopnea, palpitations, paroxysmal nocturnal dyspnea, syncope and tachypnea. Cardiovascular risk factors: diabetes mellitus, dyslipidemia, hypertension, obesity (BMI >= 30 kg/m2) and sedentary lifestyle. Use of agents associated with hypertension: none. History of target organ damage: none.  The following portions of the patient's history were reviewed and updated as appropriate:  She  has a past medical history of Ovarian cyst; Diabetes mellitus without complication; Hypothyroidism; PONV (postoperative nausea and vomiting); Asthma; Hypertension; H/O hiatal hernia; Goiter; Arthritis; and Chicken pox. She  does not have any pertinent problems on file. She  has past surgical history that includes Cholecystectomy; Cesarean section; Tonsillectomy; Hernia repair; Lipoma excision; Tumor excision; Anterior lumbar fusion (12/18/2011); and Abdominal exposure (12/18/2011). Her family history includes Cancer in her paternal grandfather; Colon cancer in her maternal aunt; Heart disease in her father; Lung cancer in her father; Pancreatic cancer in her maternal grandfather, maternal grandmother, and paternal grandmother. She  reports that she has never smoked. She has never used smokeless tobacco. She reports that she does not drink alcohol or use illicit drugs. She has a current medication list which includes the following prescription(s): atorvastatin, cetirizine, vitamin d, cyclobenzaprine, gabapentin, glucose blood, hydroxychloroquine, levonorgestrel, levothyroxine, metformin, multivitamin with minerals, nabumetone, NONFORMULARY OR COMPOUNDED ITEM, onetouch delica lancets 40J, OVER THE  COUNTER MEDICATION, oxycodone hcl, phenylephrine-acetaminophen, venlafaxine xr, and lisinopril. Current Outpatient Prescriptions on File Prior to Visit  Medication Sig Dispense Refill  . atorvastatin (LIPITOR) 10 MG tablet Take 1 tablet (10 mg total) by mouth daily. 30 tablet 2  . cetirizine (ZYRTEC) 5 MG tablet Take 5 mg by mouth daily.    . Cholecalciferol (VITAMIN D) 2000 UNITS CAPS Take 1 capsule by mouth daily.    . cyclobenzaprine (FLEXERIL) 10 MG tablet Take 5 mg by mouth at bedtime.    . gabapentin (NEURONTIN) 300 MG capsule Take 3 capsules by mouth at bedtime.     [name] glucose blood (ONE TOUCH ULTRA TEST) test strip Test blood sugar once daily. Dx code: 250.00 100 each 12  . levonorgestrel (MIRENA) 20 MCG/24HR IUD 1 each by Intrauterine route once. Implanted end of April 2014    . levothyroxine (SYNTHROID, LEVOTHROID) 25 MCG tablet Take 25 mcg by mouth daily.    . metFORMIN (GLUCOPHAGE-XR) 500 MG 24 hr tablet Take 500 mg by mouth every evening.     . Multiple Vitamin (MULTIVITAMIN WITH MINERALS) TABS Take 1 tablet by mouth daily.    . NONFORMULARY OR COMPOUNDED ITEM Blood pressure cuff--automatic 1 each 0  . ONETOUCH DELICA LANCETS 81X MISC Test blood sugar once daily. Dx code: 250.00 100 each 12  . OVER THE COUNTER MEDICATION Vitamin B-12 3000 mcg 1 table daily.    [name] oxyCODONE 10 MG TABS Take 1 tablet (10 mg total) by mouth every 6 (six) hours as needed (for pain). 60 tablet 0  . Phenylephrine-Acetaminophen (TYLENOL SINUS CONGESTION/PAIN PO) Take 2 tablets by mouth daily as needed (for sinus congestion).     . venlafaxine XR (EFFEXOR XR) 75 MG 24 hr capsule Take 1 capsule (75 mg total) by mouth daily with breakfast. 90 capsule 1   No current facility-administered medications on file prior to visit.   She is allergic to erythromycin and doxycycline..  Review  of Systems Pertinent items are noted in HPI.   Objective:    BP 148/85 mmHg  Pulse 78  Temp(Src) 98.1 F (36.7 C) (Oral)   Wt 220 lb 12.8 oz (100.154 kg)  SpO2 98% General appearance: alert, cooperative, appears stated age and no distress Neck: no adenopathy, no carotid bruit, no JVD, supple, symmetrical, trachea midline and thyroid not enlarged, symmetric, no tenderness/mass/nodules Lungs: clear to auscultation bilaterally Heart: S1, S2 normal-- + murmur 2/6 Extremities: extremities normal, atraumatic, no cyanosis or edema  Cardiographics ECG: --done in ER   Assessment:    Hypertension, elevated . Evidence of target organ damage: none.    Plan:    Medication: increase to lisinopril 5 mg. Dietary sodium restriction. Regular aerobic exercise. Check blood pressures 2-3 times weekly and record. Follow up: 4 weeks and as needed.   1. Essential hypertension  - lisinopril (PRINIVIL,ZESTRIL) 5 MG tablet; Take 1 tablet (5 mg total) by mouth daily.  Dispense: 90 tablet; Refill: 3 - 2D Echocardiogram without contrast; Future  2. Undiagnosed cardiac murmurs  - 2D Echocardiogram without contrast; Future

## 2014-01-02 ENCOUNTER — Other Ambulatory Visit: Payer: Self-pay | Admitting: Family Medicine

## 2014-01-02 ENCOUNTER — Telehealth: Payer: Self-pay

## 2014-01-02 NOTE — Telephone Encounter (Signed)
Check on her before leaving today-- thank you

## 2014-01-02 NOTE — Telephone Encounter (Signed)
[name] [phone]  [name] called to say that her chest was hurting worse than yesterday, dizzy, clammy. She stated symptoms started while she was shopping and she had to sit down. She stated that she was to call Dr Etter Sjogren if symptoms worsen. I sent message to Maudie Mercury and she advised me to send call to Triage RN. Ashlee was walking by so I asked her to take call , and she talked with [name] and then went back and talked with Dr Etter Sjogren. Dr Etter Sjogren advised patient to go to ER. While Ashlee was telling patient she needed to go to ER  Patient also stated that her pain was running down left arm. Pain level is around 5 -6, no SOB,  The patient also stated she was in [place] and she would come to [facility] ER. Even though  Ashlee advised her to go to ER in [place].

## 2014-01-02 NOTE — Telephone Encounter (Signed)
Pt states symptoms have improved some since the drive home.  Chest pain is now intermittent.  Against medical advise, pt states she's not going to go to ER.  States she's going to lay down and see how she feels whenever she gets up.    Pt states that if it worsens she will go to the ER then.

## 2014-01-07 ENCOUNTER — Other Ambulatory Visit: Payer: Self-pay | Admitting: Family Medicine

## 2014-01-07 ENCOUNTER — Encounter: Payer: Self-pay | Admitting: Family Medicine

## 2014-01-07 DIAGNOSIS — R0789 Other chest pain: Secondary | ICD-10-CM

## 2014-01-13 ENCOUNTER — Encounter: Payer: Self-pay | Admitting: Family Medicine

## 2014-01-14 ENCOUNTER — Ambulatory Visit (HOSPITAL_BASED_OUTPATIENT_CLINIC_OR_DEPARTMENT_OTHER)
Admission: RE | Admit: 2014-01-14 | Discharge: 2014-01-14 | Disposition: A | Discharge status: Home / Self Care | Payer: BC Managed Care – PPO | Source: Ambulatory Visit | Attending: Family Medicine | Admitting: Family Medicine

## 2014-01-14 DIAGNOSIS — E785 Hyperlipidemia, unspecified: Secondary | ICD-10-CM | POA: Insufficient documentation

## 2014-01-14 DIAGNOSIS — E119 Type 2 diabetes mellitus without complications: Secondary | ICD-10-CM | POA: Insufficient documentation

## 2014-01-14 DIAGNOSIS — R011 Cardiac murmur, unspecified: Secondary | ICD-10-CM

## 2014-01-14 DIAGNOSIS — I10 Essential (primary) hypertension: Secondary | ICD-10-CM | POA: Diagnosis present

## 2014-01-14 NOTE — Progress Notes (Signed)
  Echocardiogram 2D Echocardiogram has been performed.  Ranvir Renovato 01/14/2014, 1:15 PM

## 2014-01-14 NOTE — Telephone Encounter (Signed)
Spoke with pt last night.  Her echo is today.  She did not want to go to ER but said she understands that if pain worsens or feels different in any way she will go to ER.  She asked that we try to get her into cardiology sooner.  I have sent a message to Delsa Sale to work on that.

## 2014-01-16 ENCOUNTER — Encounter: Payer: Self-pay | Admitting: Internal Medicine

## 2014-01-16 ENCOUNTER — Ambulatory Visit (INDEPENDENT_AMBULATORY_CARE_PROVIDER_SITE_OTHER): Payer: BC Managed Care – PPO | Admitting: Internal Medicine

## 2014-01-16 VITALS — BP 130/70 | HR 104 | Ht 65.0 in | Wt 216.0 lb

## 2014-01-16 DIAGNOSIS — R072 Precordial pain: Secondary | ICD-10-CM

## 2014-01-16 NOTE — Progress Notes (Signed)
HPI [name] is a 48 yo who is referred for evaluation of CP  She has a history of HL, RA, HTN   SHe is followed by Michelle Mueller  She was also seen in ER for this   THe patinet says that cP comes/goes  Not associated with acitvity  Has now.   She was set up for echo and this was done.  Normal  No signif valve abnromality  Normal LVEF     Allergies  Allergen Reactions  . Erythromycin Other (See Comments)    arrhythmia  . Doxycycline Other (See Comments)    Decreased BP and caused dizziness per patient    Current Outpatient Prescriptions  Medication Sig Dispense Refill  . atorvastatin (LIPITOR) 10 MG tablet Take 1 tablet (10 mg total) by mouth daily. 30 tablet 2  . cetirizine (ZYRTEC) 5 MG tablet Take 5 mg by mouth daily.    . Cholecalciferol (VITAMIN D) 2000 UNITS CAPS Take 1 capsule by mouth daily.    . cyclobenzaprine (FLEXERIL) 10 MG tablet Take 5 mg by mouth at bedtime.    . gabapentin (NEURONTIN) 300 MG capsule Take 3 capsules by mouth at bedtime.     [name] glucose blood (ONE TOUCH ULTRA TEST) test strip Test blood sugar once daily. Dx code: 250.00 100 each 12  . hydroxychloroquine (PLAQUENIL) 200 MG tablet Take 1.5 tablets by mouth daily.    [name] levonorgestrel (MIRENA) 20 MCG/24HR IUD 1 each by Intrauterine route once. Implanted end of April 2014    . levothyroxine (SYNTHROID, LEVOTHROID) 25 MCG tablet Take 25 mcg by mouth daily.    [name] lisinopril (PRINIVIL,ZESTRIL) 5 MG tablet Take 1 tablet (5 mg total) by mouth daily. 90 tablet 3  . metFORMIN (GLUCOPHAGE-XR) 500 MG 24 hr tablet Take 500 mg by mouth every evening.     . Multiple Vitamin (MULTIVITAMIN WITH MINERALS) TABS Take 1 tablet by mouth daily.    . nabumetone (RELAFEN) 500 MG tablet Take 1 tablet by mouth 2 (two) times daily.    . NONFORMULARY OR COMPOUNDED ITEM Blood pressure cuff--automatic 1 each 0  . ONETOUCH DELICA LANCETS 93A MISC Test blood sugar once daily. Dx code: 250.00 100 each 12  . OVER THE COUNTER MEDICATION Vitamin B-12  3000 mcg 1 table daily.    [name] oxyCODONE 10 MG TABS Take 1 tablet (10 mg total) by mouth every 6 (six) hours as needed (for pain). 60 tablet 0  . venlafaxine XR (EFFEXOR-XR) 75 MG 24 hr capsule TAKE ONE CAPSULE BY MOUTH DAILY WITH BREAKFAST 90 capsule 1  . Phenylephrine-Acetaminophen (TYLENOL SINUS CONGESTION/PAIN PO) Take 2 tablets by mouth daily as needed (for sinus congestion).      No current facility-administered medications for this visit.    Past Medical History  Diagnosis Date  . Ovarian cyst   . Diabetes mellitus without complication   . Hypothyroidism   . PONV (postoperative nausea and vomiting)   . Asthma     last attack 3 yrs ago  . Hypertension     [name]  . H/O hiatal hernia   . Goiter   . Arthritis   . Chicken pox     Past Surgical History  Procedure Laterality Date  . Cholecystectomy    . Cesarean section    . Tonsillectomy    . Hernia repair    . Lipoma excision      L breast  . Tumor excision      Nerve sheath tumor, R  arm  . Anterior lumbar fusion  12/18/2011    Procedure: ANTERIOR LUMBAR FUSION 1 LEVEL;  Surgeon: [name], MD;  Location: [place] [facility];  Service: Neurosurgery;  Laterality: N/A;  Lumbar four-five Anterior Lumbar Interbody Fusion with Dr. [name] to do anterior exposure  . Abdominal exposure  12/18/2011    Procedure: ABDOMINAL EXPOSURE;  Surgeon: [name], MD;  Location: [facility];  Service: Vascular;  Laterality: N/A;  Anterior Expossure for anterior lumbar interbody fuson    Family History  Problem Relation Age of Onset  . Heart disease Father   . Lung cancer Father   . Colon cancer Maternal Aunt   . Pancreatic cancer Maternal Grandmother   . Pancreatic cancer Maternal Grandfather   . Pancreatic cancer Paternal Grandmother   . Cancer Paternal Grandfather     History   Social History  . Marital Status: Divorced    Spouse Name: N/A    Number of Children: N/A  . Years of Education: N/A   Occupational History  . Not  on file.   Social History Main Topics  . Smoking status: Never Smoker   . Smokeless tobacco: Never Used  . Alcohol Use: No  . Drug Use: No  . Sexual Activity: Yes    Birth Control/ Protection: IUD   Other Topics Concern  . Not on file   Social History Narrative    Review of Systems:  All systems reviewed.  They are negative to the above problem except as previously stated.  Vital Signs: BP 130/70 mmHg  Pulse 104  Ht 5\' 5"  (1.651 m)  Wt 216 lb (97.977 kg)  BMI 35.94 kg/m2  Physical Exam  HEENT:  Normocephalic, atraumatic. EOMI, PERRLA.  Neck: JVP is normal.  No bruits.  Lungs: clear to auscultation. No rales no wheezes.  Heart: Regular rate and rhythm. Normal S1, S2. No S3.   No significant murmurs. PMI not displaced.  Abdomen:  Supple, nontender. Normal bowel sounds. No masses. No hepatomegaly.  Extremities:   Good distal pulses throughout. No lower extremity edema.  Musculoskeletal :moving all extremities.  Neuro:   alert and oriented x3.  CN II-XII grossly intact.   Assessment and Plan: 1.  Cp  I do not think cardiac in origin.  Prob musculoskeletal or GI Echo a couple days ago was normal. I would consider Rx for GI  I would not plan further testing    2  HTN  Good control on current Rx  Will need to be followed.

## 2014-01-19 ENCOUNTER — Encounter: Payer: Self-pay | Admitting: Family Medicine

## 2014-01-19 MED ORDER — ALPRAZOLAM 0.5 MG PO TABS: 0.5000 mg | ORAL_TABLET | Freq: Three times a day (TID) | ORAL | Status: DC | PRN

## 2014-01-19 NOTE — Telephone Encounter (Signed)
Xanax 0.5 mg 1 po tid prn  #40

## 2014-01-20 ENCOUNTER — Institutional Professional Consult (permissible substitution): Payer: BC Managed Care – PPO | Admitting: Cardiology

## 2014-01-27 ENCOUNTER — Institutional Professional Consult (permissible substitution): Payer: BC Managed Care – PPO | Admitting: Cardiology

## 2014-02-03 ENCOUNTER — Ambulatory Visit: Payer: BC Managed Care – PPO | Admitting: Family Medicine

## 2014-02-10 ENCOUNTER — Other Ambulatory Visit (HOSPITAL_BASED_OUTPATIENT_CLINIC_OR_DEPARTMENT_OTHER): Payer: Self-pay | Admitting: Endocrinology

## 2014-02-10 DIAGNOSIS — E041 Nontoxic single thyroid nodule: Secondary | ICD-10-CM

## 2014-02-12 ENCOUNTER — Ambulatory Visit (HOSPITAL_BASED_OUTPATIENT_CLINIC_OR_DEPARTMENT_OTHER): Payer: BLUE CROSS/BLUE SHIELD

## 2014-02-17 ENCOUNTER — Telehealth: Payer: Self-pay | Admitting: *Deleted

## 2014-02-17 NOTE — Telephone Encounter (Signed)
Received medical record request via fax from [facility] for January 2015 until present for treatment notes. Signed consent from patient included. Faxed requested office notes to [facility] at [phone] successfully. JG//CMA

## 2014-02-20 ENCOUNTER — Encounter: Payer: Self-pay | Admitting: Family Medicine

## 2014-02-23 ENCOUNTER — Telehealth: Payer: Self-pay

## 2014-02-23 NOTE — Telephone Encounter (Addendum)
Please see chart for patient emails.    Michelle Chessman, DO  Rudene Anda, RN           Pt needs to inc lisinopril to 10 mg and make appointment later this week     Appointment has been scheduled for Thursday.  BP today:  123/78.  She shared that she has been "seeing some good numbers" lately.  She saw infrequent elevations in blood pressures on Wednesday at [place] office and over the weekend only.  Pt is nervous to change bp meds at this time as she does not want drop her blood pressure too low.  Please advise.    While on the phone, pt was advised to continue monitoring blood pressures and recording them and to bring them with her during her appointment.  Pt agreed and said she would.

## 2014-02-23 NOTE — Telephone Encounter (Signed)
con't check bp at home--- it it only goes up in drs office it may be white coat syn

## 2014-02-26 ENCOUNTER — Encounter: Payer: Self-pay | Admitting: Family Medicine

## 2014-02-26 ENCOUNTER — Ambulatory Visit (INDEPENDENT_AMBULATORY_CARE_PROVIDER_SITE_OTHER): Payer: BLUE CROSS/BLUE SHIELD | Admitting: Family Medicine

## 2014-02-26 VITALS — BP 124/72 | HR 76 | Temp 97.5°F | Wt 216.0 lb

## 2014-02-26 DIAGNOSIS — I10 Essential (primary) hypertension: Secondary | ICD-10-CM

## 2014-02-26 DIAGNOSIS — M069 Rheumatoid arthritis, unspecified: Secondary | ICD-10-CM

## 2014-02-26 DIAGNOSIS — E039 Hypothyroidism, unspecified: Secondary | ICD-10-CM

## 2014-02-26 DIAGNOSIS — E119 Type 2 diabetes mellitus without complications: Secondary | ICD-10-CM

## 2014-02-26 DIAGNOSIS — M797 Fibromyalgia: Secondary | ICD-10-CM

## 2014-02-26 DIAGNOSIS — E785 Hyperlipidemia, unspecified: Secondary | ICD-10-CM

## 2014-02-26 DIAGNOSIS — F411 Generalized anxiety disorder: Secondary | ICD-10-CM

## 2014-02-26 MED ORDER — BUSPIRONE HCL 7.5 MG PO TABS: ORAL_TABLET | ORAL | Status: DC

## 2014-02-26 NOTE — Progress Notes (Signed)
Pre visit review using our clinic review tool, if applicable. No additional management support is needed unless otherwise documented below in the visit note. 

## 2014-02-26 NOTE — Progress Notes (Signed)
Subjective:    Patient ID: Michelle Mueller, female    DOB: [birth year], 49 y.o.   MRN: [id number]  HPI  Patient here for f/u fibro, htn, cholesterol, dm, RA She would like to see Dr Amil Amen for RA -- she can make her own appointment She is seeing pain management for fibro/ra pain.  Her bp has been spiking in Drs offices.   She is also c/o worseing anxiety.  She was on buspar in past that was very effective  Past Medical History  Diagnosis Date  . Ovarian cyst   . Diabetes mellitus without complication   . Hypothyroidism   . PONV (postoperative nausea and vomiting)   . Asthma     last attack 3 yrs ago  . Hypertension     [name]  . H/O hiatal hernia   . Goiter   . Arthritis   . Chicken pox     Review of Systems  Constitutional: Positive for fatigue. Negative for activity change, appetite change and unexpected weight change.  Respiratory: Negative for cough and shortness of breath.   Cardiovascular: Positive for leg swelling. Negative for chest pain and palpitations.  Psychiatric/Behavioral: Negative for suicidal ideas, hallucinations, behavioral problems, confusion, sleep disturbance, self-injury, dysphoric mood, decreased concentration and agitation. The patient is nervous/anxious. The patient is not hyperactive.        Objective:    Physical Exam  Constitutional: She is oriented to person, place, and time. She appears well-developed and well-nourished. No distress.  HENT:  Right Ear: External ear normal.  Left Ear: External ear normal.  Nose: Nose normal.  Mouth/Throat: Oropharynx is clear and moist.  Eyes: EOM are normal. Pupils are equal, round, and reactive to light.  Neck: Normal range of motion. Neck supple.  Cardiovascular: Normal rate, regular rhythm and normal heart sounds.   No murmur heard. Pulmonary/Chest: Effort normal and breath sounds normal. No respiratory distress. She has no wheezes. She has no rales. She exhibits no tenderness.  Musculoskeletal:  Normal range of motion. She exhibits edema and tenderness.  Neurological: She is alert and oriented to person, place, and time.  Psychiatric: She has a normal mood and affect. Her behavior is normal. Judgment and thought content normal.    BP 124/72 mmHg  Pulse 76  Temp(Src) 97.5 F (36.4 C) (Oral)  Wt 216 lb (97.977 kg)  SpO2 97% Wt Readings from Last 3 Encounters:  02/26/14 216 lb (97.977 kg)  01/16/14 216 lb (97.977 kg)  01/01/14 220 lb 12.8 oz (100.154 kg)     Lab Results  Component Value Date   WBC 7.6 07/15/2013   HGB 14.1 07/15/2013   HCT 43.0 07/15/2013   PLT 325.0 07/15/2013   GLUCOSE 78 11/18/2013   CHOL 184 11/18/2013   TRIG 159.0* 11/18/2013   HDL 49.10 11/18/2013   LDLCALC 103* 11/18/2013   ALT 14 11/18/2013   AST 14 11/18/2013   NA 139 11/18/2013   K 4.5 11/18/2013   CL 106 11/18/2013   CREATININE 1.0 11/18/2013   BUN 14 11/18/2013   CO2 27 11/18/2013   TSH 2.41 07/15/2013   HGBA1C 5.3 11/18/2013   MICROALBUR 0.5 08/28/2013    No results found.     Assessment & Plan:   Problem List Items Addressed This Visit    Fibromyalgia   Rheumatoid arthritis    Other Visit Diagnoses    Generalized anxiety disorder    -  Primary    Relevant Medications    busPIRone (  BUSPAR) tablet    Hypothyroidism, unspecified hypothyroidism type        Relevant Orders    TSH    DM II (diabetes mellitus, type II), controlled        Relevant Orders    Basic metabolic panel    POCT urinalysis dipstick    Hemoglobin A1c    Essential hypertension        Relevant Orders    Basic metabolic panel    CBC with Differential/Platelet    Hepatic function panel    Lipid panel    Hyperlipidemia        Relevant Orders    Hepatic function panel    Lipid panel        Garnet Koyanagi, DO

## 2014-02-26 NOTE — Patient Instructions (Signed)

## 2014-03-02 ENCOUNTER — Emergency Department (HOSPITAL_BASED_OUTPATIENT_CLINIC_OR_DEPARTMENT_OTHER): Payer: BLUE CROSS/BLUE SHIELD

## 2014-03-02 ENCOUNTER — Telehealth: Payer: Self-pay | Admitting: Family Medicine

## 2014-03-02 ENCOUNTER — Encounter (HOSPITAL_BASED_OUTPATIENT_CLINIC_OR_DEPARTMENT_OTHER): Payer: Self-pay | Admitting: *Deleted

## 2014-03-02 ENCOUNTER — Emergency Department (HOSPITAL_BASED_OUTPATIENT_CLINIC_OR_DEPARTMENT_OTHER)
Admission: EM | Admit: 2014-03-02 | Discharge: 2014-03-02 | Disposition: A | Discharge status: Home / Self Care | Payer: BLUE CROSS/BLUE SHIELD | Attending: Emergency Medicine | Admitting: Emergency Medicine

## 2014-03-02 DIAGNOSIS — Z79899 Other long term (current) drug therapy: Secondary | ICD-10-CM | POA: Diagnosis not present

## 2014-03-02 DIAGNOSIS — J45909 Unspecified asthma, uncomplicated: Secondary | ICD-10-CM | POA: Insufficient documentation

## 2014-03-02 DIAGNOSIS — Z8719 Personal history of other diseases of the digestive system: Secondary | ICD-10-CM | POA: Insufficient documentation

## 2014-03-02 DIAGNOSIS — R42 Dizziness and giddiness: Secondary | ICD-10-CM | POA: Diagnosis present

## 2014-03-02 DIAGNOSIS — R110 Nausea: Secondary | ICD-10-CM | POA: Diagnosis not present

## 2014-03-02 DIAGNOSIS — Z8742 Personal history of other diseases of the female genital tract: Secondary | ICD-10-CM | POA: Insufficient documentation

## 2014-03-02 DIAGNOSIS — E039 Hypothyroidism, unspecified: Secondary | ICD-10-CM | POA: Insufficient documentation

## 2014-03-02 DIAGNOSIS — T43595A Adverse effect of other antipsychotics and neuroleptics, initial encounter: Secondary | ICD-10-CM | POA: Diagnosis not present

## 2014-03-02 DIAGNOSIS — I10 Essential (primary) hypertension: Secondary | ICD-10-CM | POA: Diagnosis not present

## 2014-03-02 DIAGNOSIS — Z8659 Personal history of other mental and behavioral disorders: Secondary | ICD-10-CM | POA: Diagnosis not present

## 2014-03-02 DIAGNOSIS — E119 Type 2 diabetes mellitus without complications: Secondary | ICD-10-CM | POA: Insufficient documentation

## 2014-03-02 DIAGNOSIS — Z8619 Personal history of other infectious and parasitic diseases: Secondary | ICD-10-CM | POA: Diagnosis not present

## 2014-03-02 DIAGNOSIS — T50905A Adverse effect of unspecified drugs, medicaments and biological substances, initial encounter: Secondary | ICD-10-CM

## 2014-03-02 DIAGNOSIS — Z791 Long term (current) use of non-steroidal anti-inflammatories (NSAID): Secondary | ICD-10-CM | POA: Diagnosis not present

## 2014-03-02 DIAGNOSIS — R002 Palpitations: Secondary | ICD-10-CM | POA: Diagnosis not present

## 2014-03-02 HISTORY — DX: Major depressive disorder, single episode, unspecified: F32.9

## 2014-03-02 HISTORY — DX: Anxiety disorder, unspecified: F41.9

## 2014-03-02 HISTORY — DX: Fibromyalgia: M79.7

## 2014-03-02 HISTORY — DX: Depression, unspecified: F32.A

## 2014-03-02 LAB — CBC WITH DIFFERENTIAL/PLATELET
Basophils Absolute: 0 10*3/uL (ref 0.0–0.1)
Basophils Relative: 0 % (ref 0–1)
EOS PCT: 3 % (ref 0–5)
Eosinophils Absolute: 0.1 10*3/uL (ref 0.0–0.7)
HEMATOCRIT: 40.4 % (ref 36.0–46.0)
Hemoglobin: 13.2 g/dL (ref 12.0–15.0)
Lymphocytes Relative: 39 % (ref 12–46)
Lymphs Abs: 2 10*3/uL (ref 0.7–4.0)
MCH: 27.5 pg (ref 26.0–34.0)
MCHC: 32.7 g/dL (ref 30.0–36.0)
MCV: 84.2 fL (ref 78.0–100.0)
MONO ABS: 0.5 10*3/uL (ref 0.1–1.0)
Monocytes Relative: 9 % (ref 3–12)
NEUTROS ABS: 2.5 10*3/uL (ref 1.7–7.7)
Neutrophils Relative %: 49 % (ref 43–77)
PLATELETS: 267 10*3/uL (ref 150–400)
RBC: 4.8 MIL/uL (ref 3.87–5.11)
RDW: 13.4 % (ref 11.5–15.5)
WBC: 5.2 10*3/uL (ref 4.0–10.5)

## 2014-03-02 LAB — URINALYSIS, ROUTINE W REFLEX MICROSCOPIC
BILIRUBIN URINE: NEGATIVE
Glucose, UA: NEGATIVE mg/dL
Hgb urine dipstick: NEGATIVE
Ketones, ur: NEGATIVE mg/dL
LEUKOCYTES UA: NEGATIVE
Nitrite: NEGATIVE
PH: 7 (ref 5.0–8.0)
PROTEIN: NEGATIVE mg/dL
SPECIFIC GRAVITY, URINE: 1.002 — AB (ref 1.005–1.030)
Urobilinogen, UA: 0.2 mg/dL (ref 0.0–1.0)

## 2014-03-02 LAB — BASIC METABOLIC PANEL
Anion gap: 2 — ABNORMAL LOW (ref 5–15)
BUN: 12 mg/dL (ref 6–23)
CALCIUM: 8.5 mg/dL (ref 8.4–10.5)
CO2: 29 mmol/L (ref 19–32)
CREATININE: 0.94 mg/dL (ref 0.50–1.10)
Chloride: 107 mmol/L (ref 96–112)
GFR calc Af Amer: 83 mL/min — ABNORMAL LOW (ref >90)
GFR calc non Af Amer: 72 mL/min — ABNORMAL LOW (ref >90)
GLUCOSE: 104 mg/dL — AB (ref 70–99)
Potassium: 3.9 mmol/L (ref 3.5–5.1)
Sodium: 138 mmol/L (ref 135–145)

## 2014-03-02 MED ORDER — MECLIZINE HCL 25 MG PO TABS
25.0000 mg | ORAL_TABLET | Freq: Once | ORAL | Status: AC
  Administered 2014-03-02: 25 mg via ORAL
  Filled 2014-03-02: qty 1

## 2014-03-02 MED ORDER — MECLIZINE HCL 25 MG PO TABS: 25.0000 mg | ORAL_TABLET | Freq: Three times a day (TID) | ORAL | Status: DC | PRN

## 2014-03-02 NOTE — Discharge Instructions (Signed)
Return here or follow up for any continued or worsening symptoms Vertigo Vertigo means you feel like you are moving when you are not. Vertigo can make you feel like things around you are moving when they are not. This problem often goes away on its own.  HOME CARE   Follow your doctor's instructions.  Avoid driving.  Avoid using heavy machinery.  Avoid doing any activity that could be dangerous if you have a vertigo attack.  Tell your doctor if a medicine seems to cause your vertigo. GET HELP RIGHT AWAY IF:   Your medicines do not help or make you feel worse.  You have trouble talking or walking.  You feel weak or have trouble using your arms, hands, or legs.  You have bad headaches.  You keep feeling sick to your stomach (nauseous) or throwing up (vomiting).  Your vision changes.  A family member notices changes in your behavior.  Your problems get worse. MAKE SURE YOU:  Understand these instructions.  Will watch your condition.  Will get help right away if you are not doing well or get worse. Document Released: 10/26/2007 Document Revised: 04/10/2011 Document Reviewed: 08/04/2010 [facility] Patient Information 2015 [place], [place]. This information is not intended to replace advice given to you by your health care provider. Make sure you discuss any questions you have with your health care provider.

## 2014-03-02 NOTE — ED Provider Notes (Signed)
CSN: [id number]     Arrival date & time 03/02/14  1259 History   First MD Initiated Contact with Patient 03/02/14 1304     Chief Complaint  Patient presents with  . Dizziness     (Consider location/radiation/quality/duration/timing/severity/associated sxs/prior Treatment) HPI Comments: She was started on buspar 3 days ago and unsure if this is related to this. She work up with it this morning and so she decided not to take it today. She has had it previously about 15 years ago without any problem  Patient is a 49 y.o. female presenting with dizziness. The history is provided by the patient. No language interpreter was used.  Dizziness Severity:  Moderate Onset quality:  Sudden Timing:  Constant Progression:  Unchanged Chronicity:  New Context: bending over, head movement and standing up   Relieved by:  Being still Associated symptoms: nausea and palpitations   Associated symptoms: no diarrhea, no headaches, no hearing loss, no shortness of breath, no syncope, no tinnitus, no vision changes and no vomiting     Past Medical History  Diagnosis Date  . Ovarian cyst   . Diabetes mellitus without complication   . Hypothyroidism   . PONV (postoperative nausea and vomiting)   . Asthma     last attack 3 yrs ago  . Hypertension     [name]  . H/O hiatal hernia   . Goiter   . Arthritis   . Chicken pox   . Anxiety   . Depression   . Fibromyalgia    Past Surgical History  Procedure Laterality Date  . Cholecystectomy    . Cesarean section    . Tonsillectomy    . Hernia repair    . Lipoma excision      L breast  . Tumor excision      Nerve sheath tumor, R arm  . Anterior lumbar fusion  12/18/2011    Procedure: ANTERIOR LUMBAR FUSION 1 LEVEL;  Surgeon: [name], MD;  Location: [facility];  Service: Neurosurgery;  Laterality: N/A;  Lumbar four-five Anterior Lumbar Interbody Fusion with Dr. [name] to do anterior exposure  . Abdominal exposure  12/18/2011    Procedure:  ABDOMINAL EXPOSURE;  Surgeon: [name], MD;  Location: [facility];  Service: Vascular;  Laterality: N/A;  Anterior Expossure for anterior lumbar interbody fuson   Family History  Problem Relation Age of Onset  . Heart disease Father   . Lung cancer Father   . Colon cancer Maternal Aunt   . Pancreatic cancer Maternal Grandmother   . Pancreatic cancer Maternal Grandfather   . Pancreatic cancer Paternal Grandmother   . Cancer Paternal Grandfather    History  Substance Use Topics  . Smoking status: Never Smoker   . Smokeless tobacco: Never Used  . Alcohol Use: No   OB History    Gravida Para Term Preterm AB TAB SAB Ectopic Multiple Living   2 2             Review of Systems  HENT: Negative for hearing loss and tinnitus.   Respiratory: Negative for shortness of breath.   Cardiovascular: Positive for palpitations. Negative for syncope.  Gastrointestinal: Positive for nausea. Negative for vomiting and diarrhea.  Neurological: Positive for dizziness. Negative for headaches.  All other systems reviewed and are negative.     Allergies  Erythromycin and Doxycycline  Home Medications   Prior to Admission medications   Medication Sig Start Date End Date Taking? Authorizing Provider  ALPRAZolam (XANAX) 0.5 MG tablet Take 1 tablet (0.5 mg total) by mouth 3 (three) times daily as needed for anxiety. 01/19/14   Rosalita Chessman, DO  atorvastatin (LIPITOR) 10 MG tablet Take 1 tablet (10 mg total) by mouth daily. 12/19/13   Rosalita Chessman, DO  busPIRone (BUSPAR) 7.5 MG tablet 1 po bid 02/26/14   Rosalita Chessman, DO  cetirizine (ZYRTEC) 5 MG tablet Take 5 mg by mouth daily.    Historical Provider, MD  Cholecalciferol (VITAMIN D) 2000 UNITS CAPS Take 1 capsule by mouth daily.    Historical Provider, MD  cyclobenzaprine (FLEXERIL) 10 MG tablet Take 5 mg by mouth at bedtime.    Historical Provider, MD  gabapentin (NEURONTIN) 300 MG capsule Take 3 capsules by mouth at bedtime.  02/07/13   Bonna Gains, MD  glucose blood (ONE TOUCH ULTRA TEST) test strip Test blood sugar once daily. Dx code: 250.00 03/26/13   Rosalita Chessman, DO  levonorgestrel (MIRENA) 20 MCG/24HR IUD 1 each by Intrauterine route once. Implanted end of April 2014    Historical Provider, MD  levothyroxine (SYNTHROID, LEVOTHROID) 25 MCG tablet Take 25 mcg by mouth daily.    Historical Provider, MD  metFORMIN (GLUCOPHAGE-XR) 500 MG 24 hr tablet Take 500 mg by mouth every evening.     Historical Provider, MD  Multiple Vitamin (MULTIVITAMIN WITH MINERALS) TABS Take 1 tablet by mouth daily.    Historical Provider, MD  nabumetone (RELAFEN) 500 MG tablet Take 1 tablet by mouth 2 (two) times daily. 11/12/13   Historical Provider, MD  NONFORMULARY OR COMPOUNDED ITEM Blood pressure cuff--automatic 08/28/13   Rosalita Chessman, DO  ONETOUCH DELICA LANCETS 20U MISC Test blood sugar once daily. Dx code: 250.00 03/26/13   Rosalita Chessman, DO  OVER THE COUNTER MEDICATION Vitamin B-12 3000 mcg 1 table daily.    Historical Provider, MD  oxyCODONE 10 MG TABS Take 1 tablet (10 mg total) by mouth every 6 (six) hours as needed (for pain). 06/21/12   [name], MD  Phenylephrine-Acetaminophen (TYLENOL SINUS CONGESTION/PAIN PO) Take 2 tablets by mouth daily as needed (for sinus congestion).     Historical Provider, MD  venlafaxine XR (EFFEXOR-XR) 75 MG 24 hr capsule TAKE ONE CAPSULE BY MOUTH DAILY WITH BREAKFAST 01/05/14   Yvonne R Lowne, DO   BP 141/87 mmHg  Pulse 88  Temp(Src) 98.1 F (36.7 C) (Oral)  Resp 18  Ht 5\' 5"  (1.651 m)  Wt 216 lb (97.977 kg)  BMI 35.94 kg/m2  SpO2 95% Physical Exam  Constitutional: She is oriented to person, place, and time. She appears well-developed and well-nourished.  HENT:  Head: Atraumatic.  Eyes: Conjunctivae and EOM are normal. Pupils are equal, round, and reactive to light.  Neck: Normal range of motion. Neck supple.  Cardiovascular: Normal rate and regular rhythm.   Pulmonary/Chest: Effort normal  and breath sounds normal.  Abdominal: Soft. Bowel sounds are normal.  Musculoskeletal: Normal range of motion.  Neurological: She is alert and oriented to person, place, and time. Coordination normal.  Moves all extremities without any problem. Pt is able to ambulate without assistance.   Skin: Skin is dry.  Nursing note and vitals reviewed.   ED Course  Procedures (including critical care time) Labs Review Labs Reviewed  BASIC METABOLIC PANEL - Abnormal; Notable for the following:    Glucose, Bld 104 (*)    GFR calc non Af Amer 72 (*)    GFR calc Af  Amer 83 (*)    Anion gap 2 (*)    All other components within normal limits  URINALYSIS, ROUTINE W REFLEX MICROSCOPIC - Abnormal; Notable for the following:    Specific Gravity, Urine 1.002 (*)    All other components within normal limits  CBC WITH DIFFERENTIAL/PLATELET    Imaging Review Dg Chest 2 View  03/02/2014   CLINICAL DATA:  Dizziness  EXAM: CHEST  2 VIEW  COMPARISON:  12/07/2011  FINDINGS: Normal heart size and mediastinal contours. No acute infiltrate or edema. No effusion or pneumothorax. No acute osseous findings.  IMPRESSION: No active cardiopulmonary disease.   Electronically Signed   By: Jorje Guild M.D.   On: 03/02/2014 14:23     EKG Interpretation   Date/Time:  Monday March 02 2014 13:36:42 EST Ventricular Rate:  62 PR Interval:  134 QRS Duration: 90 QT Interval:  428 QTC Calculation: 434 R Axis:   86 Text Interpretation:  Normal sinus rhythm Normal ECG No significant change  since last tracing Confirmed by HARRISON  MD, FORREST ([id number]) on 03/02/2014  1:47:42 PM      MDM   Final diagnoses:  Dizziness  Vertigo  Medication side effect, initial encounter    Pt feeling a little better with meclizine. Likely a side effect of the buspar. Pt is going to stop. Pt is able to ambulate on her on. Discussed return precautions. Pt refused head ct at this time.     Glendell Docker, NP 03/02/14  [place], MD 03/02/14 [phone]

## 2014-03-02 NOTE — ED Notes (Signed)
Dizziness this am. Feels like she will pass out when she stands. She was started on Buspar 1/28 is the only think out of the ordinary for her.

## 2014-03-02 NOTE — Telephone Encounter (Signed)
[facility] Day - Client TELEPHONE ADVICE RECORD [facility] Patient Name: Michelle Mueller DOB: 08/05/1965 Initial Comment Caller States she is having a med. reaction and is having heart fluttery, when stands up feels like she could pass out. exhausted and unable sleeping. trouble speaking, slurring words. Nurse Assessment Nurse: Rock Nephew, RN, Juliann Pulse Date/Time (Eastern Time): 03/02/2014 12:02:32 PM Confirm and document reason for call. If symptomatic, describe symptoms. ---Caller states she is having heart flutterings, when she stands up feels like she could pass out. She is exhausted and weak, she cannot sleep and feels like she will pass out. She is not slurring her words but does feel like she has a hard time forming words ( speech sounded normal to this nurse at this time ) . Has the patient traveled out of the country within the last 30 days? ---Not Applicable Does the patient require triage? ---Yes Related visit to physician within the last 2 weeks? ---Yes was recently started on Buspar Does the PT have any chronic conditions? (i.e. diabetes, asthma, etc.) ---Yes List chronic conditions. ---HTN, Pre Diabetes, Fibromyalgia, Anxiety and Depression Did the patient indicate they were pregnant? ---No Guidelines Guideline Title Affirmed Question Affirmed Notes Heart Rate and Heartbeat Questions Dizziness, lightheadedness, or weakness Final Disposition User Go to ED Now Rock Nephew, RN, Juliann Pulse

## 2014-03-02 NOTE — Telephone Encounter (Signed)
Patient was seen in ED. 

## 2014-03-03 NOTE — ED Notes (Signed)
Pt called back requesting her Rx be called in to   [facility] stating she is on a pain program and all meds need to come from the same drug store.  Discussed with Dr. Wilhemena Durie and prescription called into [facility] in [place] at [phone]  For                   antivert 25mg    #20   One tablet three times a day as needed for dizziness  No refills

## 2014-03-10 ENCOUNTER — Ambulatory Visit (HOSPITAL_BASED_OUTPATIENT_CLINIC_OR_DEPARTMENT_OTHER): Payer: BLUE CROSS/BLUE SHIELD

## 2014-03-11 ENCOUNTER — Other Ambulatory Visit (INDEPENDENT_AMBULATORY_CARE_PROVIDER_SITE_OTHER): Payer: BLUE CROSS/BLUE SHIELD

## 2014-03-11 ENCOUNTER — Encounter: Payer: Self-pay | Admitting: Family Medicine

## 2014-03-11 DIAGNOSIS — E119 Type 2 diabetes mellitus without complications: Secondary | ICD-10-CM

## 2014-03-11 DIAGNOSIS — E785 Hyperlipidemia, unspecified: Secondary | ICD-10-CM

## 2014-03-11 DIAGNOSIS — E039 Hypothyroidism, unspecified: Secondary | ICD-10-CM

## 2014-03-11 DIAGNOSIS — I10 Essential (primary) hypertension: Secondary | ICD-10-CM

## 2014-03-11 LAB — HEPATIC FUNCTION PANEL
ALT: 21 U/L (ref 0–35)
AST: 20 U/L (ref 0–37)
Albumin: 4.1 g/dL (ref 3.5–5.2)
Alkaline Phosphatase: 55 U/L (ref 39–117)
BILIRUBIN DIRECT: 0.1 mg/dL (ref 0.0–0.3)
TOTAL PROTEIN: 6.7 g/dL (ref 6.0–8.3)
Total Bilirubin: 0.5 mg/dL (ref 0.2–1.2)

## 2014-03-11 LAB — CBC WITH DIFFERENTIAL/PLATELET
Basophils Absolute: 0 10*3/uL (ref 0.0–0.1)
Basophils Relative: 0.5 % (ref 0.0–3.0)
EOS PCT: 5.3 % — AB (ref 0.0–5.0)
Eosinophils Absolute: 0.3 10*3/uL (ref 0.0–0.7)
HCT: 40.3 % (ref 36.0–46.0)
Hemoglobin: 13.5 g/dL (ref 12.0–15.0)
LYMPHS ABS: 2.2 10*3/uL (ref 0.7–4.0)
Lymphocytes Relative: 40.9 % (ref 12.0–46.0)
MCHC: 33.6 g/dL (ref 30.0–36.0)
MCV: 82.1 fl (ref 78.0–100.0)
MONO ABS: 0.4 10*3/uL (ref 0.1–1.0)
Monocytes Relative: 6.9 % (ref 3.0–12.0)
Neutro Abs: 2.5 10*3/uL (ref 1.4–7.7)
Neutrophils Relative %: 46.4 % (ref 43.0–77.0)
PLATELETS: 239 10*3/uL (ref 150.0–400.0)
RBC: 4.9 Mil/uL (ref 3.87–5.11)
RDW: 13.6 % (ref 11.5–15.5)
WBC: 5.4 10*3/uL (ref 4.0–10.5)

## 2014-03-11 LAB — BASIC METABOLIC PANEL
BUN: 13 mg/dL (ref 6–23)
CHLORIDE: 107 meq/L (ref 96–112)
CO2: 26 meq/L (ref 19–32)
Calcium: 9 mg/dL (ref 8.4–10.5)
Creatinine, Ser: 1 mg/dL (ref 0.40–1.20)
GFR: 63.17 mL/min (ref >60.00)
Glucose, Bld: 95 mg/dL (ref 70–99)
POTASSIUM: 4 meq/L (ref 3.5–5.1)
SODIUM: 138 meq/L (ref 135–145)

## 2014-03-11 LAB — LIPID PANEL
CHOLESTEROL: 124 mg/dL (ref 0–200)
HDL: 55.2 mg/dL (ref >39.00)
LDL CALC: 58 mg/dL (ref 0–99)
NonHDL: 68.8
Total CHOL/HDL Ratio: 2
Triglycerides: 56 mg/dL (ref 0.0–149.0)
VLDL: 11.2 mg/dL (ref 0.0–40.0)

## 2014-03-11 LAB — TSH: TSH: 1.22 u[IU]/mL (ref 0.35–4.50)

## 2014-03-11 LAB — HEMOGLOBIN A1C: HEMOGLOBIN A1C: 5.5 % (ref 4.6–6.5)

## 2014-03-11 NOTE — Telephone Encounter (Signed)
lexapro 10 mg #30  1 po qhs, 2 refills

## 2014-03-12 ENCOUNTER — Other Ambulatory Visit: Payer: Self-pay

## 2014-03-12 MED ORDER — ESCITALOPRAM OXALATE 10 MG PO TABS: 10.0000 mg | ORAL_TABLET | Freq: Every day | ORAL | Status: DC

## 2014-03-13 ENCOUNTER — Encounter: Payer: Self-pay | Admitting: Family Medicine

## 2014-03-18 ENCOUNTER — Encounter: Payer: Self-pay | Admitting: Family Medicine

## 2014-03-18 ENCOUNTER — Other Ambulatory Visit: Payer: Self-pay | Admitting: Family Medicine

## 2014-03-18 MED ORDER — ATORVASTATIN CALCIUM 10 MG PO TABS: 10.0000 mg | ORAL_TABLET | Freq: Every day | ORAL | Status: DC

## 2014-04-13 ENCOUNTER — Other Ambulatory Visit: Payer: Self-pay

## 2014-04-13 MED ORDER — FLUOXETINE HCL 10 MG PO CAPS: 10.0000 mg | ORAL_CAPSULE | Freq: Every day | ORAL | Status: DC

## 2014-04-13 NOTE — Telephone Encounter (Signed)
Switch to prozac 10 mg #30  1 po qd , 2 refills  Ov 1 month

## 2014-04-17 ENCOUNTER — Other Ambulatory Visit: Payer: Self-pay

## 2014-04-17 MED ORDER — VENLAFAXINE HCL ER 150 MG PO CP24: 150.0000 mg | ORAL_CAPSULE | Freq: Every day | ORAL | Status: DC

## 2014-04-17 NOTE — Telephone Encounter (Signed)
D/c lexapro effexor xr 150 mg #30  1 po qd , 2 refills

## 2014-06-11 ENCOUNTER — Encounter: Payer: Self-pay | Admitting: Family Medicine

## 2014-06-11 ENCOUNTER — Ambulatory Visit (INDEPENDENT_AMBULATORY_CARE_PROVIDER_SITE_OTHER): Payer: BLUE CROSS/BLUE SHIELD | Admitting: Family Medicine

## 2014-06-11 VITALS — BP 120/78 | HR 90 | Temp 98.2°F | Wt 214.4 lb

## 2014-06-11 DIAGNOSIS — F329 Major depressive disorder, single episode, unspecified: Secondary | ICD-10-CM | POA: Diagnosis not present

## 2014-06-11 DIAGNOSIS — M5441 Lumbago with sciatica, right side: Secondary | ICD-10-CM

## 2014-06-11 DIAGNOSIS — R350 Frequency of micturition: Secondary | ICD-10-CM | POA: Diagnosis not present

## 2014-06-11 DIAGNOSIS — R208 Other disturbances of skin sensation: Secondary | ICD-10-CM

## 2014-06-11 DIAGNOSIS — F32A Depression, unspecified: Secondary | ICD-10-CM

## 2014-06-11 DIAGNOSIS — R200 Anesthesia of skin: Secondary | ICD-10-CM

## 2014-06-11 LAB — POCT URINALYSIS DIPSTICK
Bilirubin, UA: NEGATIVE
Blood, UA: NEGATIVE
GLUCOSE UA: NEGATIVE
KETONES UA: NEGATIVE
Leukocytes, UA: NEGATIVE
Nitrite, UA: NEGATIVE
Protein, UA: NEGATIVE
SPEC GRAV UA: 1.01
Urobilinogen, UA: 2
pH, UA: 6

## 2014-06-11 MED ORDER — VORTIOXETINE HBR 10 MG PO TABS: ORAL_TABLET | ORAL | Status: DC

## 2014-06-11 NOTE — Progress Notes (Signed)
Pre visit review using our clinic review tool, if applicable. No additional management support is needed unless otherwise documented below in the visit note. 

## 2014-06-11 NOTE — Progress Notes (Signed)
Patient ID: Michelle Mueller, female    DOB: January 07, 1966  Age: 49 y.o. MRN: [id number]    Subjective:  Subjective HPI Michelle Mueller presents for f/u anxiety and urinary frequency.  She wants to change her meds.  Review of Systems  Constitutional: Negative for diaphoresis, appetite change, fatigue and unexpected weight change.  Eyes: Negative for pain, redness and visual disturbance.  Respiratory: Negative for cough, chest tightness, shortness of breath and wheezing.   Cardiovascular: Negative for chest pain, palpitations and leg swelling.  Endocrine: Negative for cold intolerance, heat intolerance, polydipsia, polyphagia and polyuria.  Genitourinary: Negative for dysuria, frequency and difficulty urinating.  Neurological: Negative for dizziness, light-headedness, numbness and headaches.  Psychiatric/Behavioral: Positive for dysphoric mood. Negative for behavioral problems and agitation. The patient is nervous/anxious.     History Past Medical History  Diagnosis Date  . Ovarian cyst   . Diabetes mellitus without complication   . Hypothyroidism   . PONV (postoperative nausea and vomiting)   . Asthma     last attack 3 yrs ago  . Hypertension     [name]  . H/O hiatal hernia   . Goiter   . Arthritis   . Chicken pox   . Anxiety   . Depression   . Fibromyalgia     She has past surgical history that includes Cholecystectomy; Cesarean section; Tonsillectomy; Hernia repair; Lipoma excision; Tumor excision; Anterior lumbar fusion (12/18/2011); and Abdominal exposure (12/18/2011).   Her family history includes Cancer in her paternal grandfather; Colon cancer in her maternal aunt; Heart disease in her father; Lung cancer in her father; Pancreatic cancer in her maternal grandfather, maternal grandmother, and paternal grandmother.She reports that she has never smoked. She has never used smokeless tobacco. She reports that she does not drink alcohol or use illicit drugs.  Current Outpatient  Prescriptions on File Prior to Visit  Medication Sig Dispense Refill  . ALPRAZolam (XANAX) 0.5 MG tablet Take 1 tablet (0.5 mg total) by mouth 3 (three) times daily as needed for anxiety. 40 tablet 0  . atorvastatin (LIPITOR) 10 MG tablet Take 1 tablet (10 mg total) by mouth daily. 30 tablet 5  . cetirizine (ZYRTEC) 5 MG tablet Take 5 mg by mouth daily.    . Cholecalciferol (VITAMIN D) 2000 UNITS CAPS Take 1 capsule by mouth daily.    . cyclobenzaprine (FLEXERIL) 10 MG tablet Take 5 mg by mouth at bedtime.    . gabapentin (NEURONTIN) 300 MG capsule Take 3 capsules by mouth at bedtime.     [name] glucose blood (ONE TOUCH ULTRA TEST) test strip Test blood sugar once daily. Dx code: 250.00 100 each 12  . levonorgestrel (MIRENA) 20 MCG/24HR IUD 1 each by Intrauterine route once. Implanted end of April 2014    . metFORMIN (GLUCOPHAGE-XR) 500 MG 24 hr tablet Take 500 mg by mouth every evening.     . Multiple Vitamin (MULTIVITAMIN WITH MINERALS) TABS Take 1 tablet by mouth daily.    . NONFORMULARY OR COMPOUNDED ITEM Blood pressure cuff--automatic 1 each 0  . ONETOUCH DELICA LANCETS 57Q MISC Test blood sugar once daily. Dx code: 250.00 100 each 12  . OVER THE COUNTER MEDICATION Vitamin B-12 3000 mcg 1 table daily.    [name] oxyCODONE 10 MG TABS Take 1 tablet (10 mg total) by mouth every 6 (six) hours as needed (for pain). 60 tablet 0  . Phenylephrine-Acetaminophen (TYLENOL SINUS CONGESTION/PAIN PO) Take 2 tablets by mouth daily as needed (for sinus congestion).  No current facility-administered medications on file prior to visit.     Objective:  Objective Physical Exam  Constitutional: She is oriented to person, place, and time. She appears well-developed and well-nourished.  HENT:  Head: Normocephalic and atraumatic.  Eyes: Conjunctivae and EOM are normal.  Neck: Normal range of motion. Neck supple. No JVD present. Carotid bruit is not present. No thyromegaly present.  Cardiovascular: Normal rate,  regular rhythm and normal heart sounds.   No murmur heard. Pulmonary/Chest: Effort normal and breath sounds normal. No respiratory distress. She has no wheezes. She has no rales. She exhibits no tenderness.  Musculoskeletal: She exhibits no edema.  Neurological: She is alert and oriented to person, place, and time.  Psychiatric: Her behavior is normal. Judgment and thought content normal. Her mood appears anxious. She exhibits a depressed mood.   BP 120/78 mmHg  Pulse 90  Temp(Src) 98.2 F (36.8 C) (Oral)  Wt 214 lb 6.4 oz (97.251 kg)  SpO2 97% Wt Readings from Last 3 Encounters:  06/11/14 214 lb 6.4 oz (97.251 kg)  03/02/14 216 lb (97.977 kg)  02/26/14 216 lb (97.977 kg)     Lab Results  Component Value Date   WBC 5.4 03/11/2014   HGB 13.5 03/11/2014   HCT 40.3 03/11/2014   PLT 239.0 03/11/2014   GLUCOSE 95 03/11/2014   CHOL 124 03/11/2014   TRIG 56.0 03/11/2014   HDL 55.20 03/11/2014   LDLCALC 58 03/11/2014   ALT 21 03/11/2014   AST 20 03/11/2014   NA 138 03/11/2014   K 4.0 03/11/2014   CL 107 03/11/2014   CREATININE 1.00 03/11/2014   BUN 13 03/11/2014   CO2 26 03/11/2014   TSH 1.22 03/11/2014   HGBA1C 5.5 03/11/2014   MICROALBUR 0.5 08/28/2013    Dg Chest 2 View  03/02/2014   CLINICAL DATA:  Dizziness  EXAM: CHEST  2 VIEW  COMPARISON:  12/07/2011  FINDINGS: Normal heart size and mediastinal contours. No acute infiltrate or edema. No effusion or pneumothorax. No acute osseous findings.  IMPRESSION: No active cardiopulmonary disease.   Electronically Signed   By: Jorje Guild M.D.   On: 03/02/2014 14:23     Assessment & Plan:  Plan I have discontinued Michelle Mueller's nabumetone, meclizine, escitalopram, and venlafaxine XR. I am also having her start on Vortioxetine HBr. Additionally, I am having her maintain her levonorgestrel, metFORMIN, cyclobenzaprine, Phenylephrine-Acetaminophen (TYLENOL SINUS CONGESTION/PAIN PO), multivitamin with minerals, Vitamin D, Oxycodone  HCl, gabapentin, glucose blood, ONETOUCH DELICA LANCETS 46N, OVER THE COUNTER MEDICATION, cetirizine, NONFORMULARY OR COMPOUNDED ITEM, ALPRAZolam, atorvastatin, lisinopril, hydroxychloroquine, sulfaSALAzine, and levothyroxine.  Meds ordered this encounter  Medications  . lisinopril (PRINIVIL,ZESTRIL) 5 MG tablet    Sig: Take 1 tablet by mouth daily.    Refill:  3  . hydroxychloroquine (PLAQUENIL) 200 MG tablet    Sig: Take 1.5 tablets by mouth daily.    Refill:  2  . sulfaSALAzine (AZULFIDINE) 500 MG EC tablet    Sig: Take 2 tablets by mouth 2 (two) times daily.    Refill:  1  . levothyroxine (SYNTHROID, LEVOTHROID) 50 MCG tablet    Sig: Take 1 tablet by mouth daily.    Refill:  11  . Vortioxetine HBr (BRINTELLIX) 10 MG TABS    Sig: 1 po qd    Dispense:  30 tablet    Refill:  2    Problem List Items Addressed This Visit    None    Visit Diagnoses  Depression    -  Primary    Relevant Medications    Vortioxetine HBr (BRINTELLIX) 10 MG TABS    Numbness of perineum        Midline low back pain with right-sided sciatica        Frequency of urination        Relevant Orders    POCT Urinalysis Dipstick (Completed)     f/u 1 month or sooner prn  Follow-up: Return in about 4 weeks (around 07/09/2014), or if symptoms worsen or fail to improve, for f/u depression.  Garnet Koyanagi, DO

## 2014-06-11 NOTE — Patient Instructions (Signed)

## 2014-06-12 NOTE — Telephone Encounter (Signed)
Delsa Sale-- is there a psych in [place]?

## 2014-06-17 ENCOUNTER — Ambulatory Visit: Payer: BLUE CROSS/BLUE SHIELD | Admitting: Obstetrics & Gynecology

## 2014-06-18 ENCOUNTER — Other Ambulatory Visit (HOSPITAL_COMMUNITY): Payer: Self-pay | Admitting: Rheumatology

## 2014-06-18 DIAGNOSIS — R52 Pain, unspecified: Secondary | ICD-10-CM

## 2014-06-27 ENCOUNTER — Ambulatory Visit (HOSPITAL_BASED_OUTPATIENT_CLINIC_OR_DEPARTMENT_OTHER)
Admission: RE | Admit: 2014-06-27 | Discharge: 2014-06-27 | Disposition: A | Discharge status: Home / Self Care | Payer: BLUE CROSS/BLUE SHIELD | Source: Ambulatory Visit | Attending: Rheumatology | Admitting: Rheumatology

## 2014-06-27 DIAGNOSIS — M545 Low back pain: Secondary | ICD-10-CM | POA: Insufficient documentation

## 2014-06-27 DIAGNOSIS — R200 Anesthesia of skin: Secondary | ICD-10-CM | POA: Insufficient documentation

## 2014-06-27 DIAGNOSIS — R52 Pain, unspecified: Secondary | ICD-10-CM

## 2014-06-27 DIAGNOSIS — M533 Sacrococcygeal disorders, not elsewhere classified: Secondary | ICD-10-CM | POA: Insufficient documentation

## 2014-06-27 DIAGNOSIS — M79606 Pain in leg, unspecified: Secondary | ICD-10-CM | POA: Diagnosis not present

## 2014-06-27 DIAGNOSIS — M5127 Other intervertebral disc displacement, lumbosacral region: Secondary | ICD-10-CM | POA: Insufficient documentation

## 2014-06-27 DIAGNOSIS — R3919 Other difficulties with micturition: Secondary | ICD-10-CM | POA: Diagnosis not present

## 2014-06-30 ENCOUNTER — Telehealth: Payer: Self-pay | Admitting: Family Medicine

## 2014-06-30 DIAGNOSIS — F32A Depression, unspecified: Secondary | ICD-10-CM

## 2014-06-30 DIAGNOSIS — F329 Major depressive disorder, single episode, unspecified: Secondary | ICD-10-CM

## 2014-06-30 NOTE — Telephone Encounter (Signed)
Please advise or if the patient can call herself.    KP

## 2014-06-30 NOTE — Telephone Encounter (Signed)
Pt was advised by behavior health office that a referral needed to be placed before an appointment can be made.

## 2014-06-30 NOTE — Telephone Encounter (Signed)
Relation to SE:GBTD Call back number:[phone]   Reason for call:  Pt was advised she needed a referral to [facility]. As per pt MD referred pt to another behavior counselor but pt has seen Dr. Hedy Jacob in the past.

## 2014-06-30 NOTE — Telephone Encounter (Signed)
She should be able to call herself-- if she has a problem let us know

## 2014-07-01 NOTE — Telephone Encounter (Signed)
Ref placed.      KP 

## 2014-07-06 ENCOUNTER — Ambulatory Visit: Payer: BLUE CROSS/BLUE SHIELD | Admitting: Obstetrics & Gynecology

## 2014-07-07 ENCOUNTER — Telehealth: Payer: Self-pay | Admitting: Family Medicine

## 2014-07-07 DIAGNOSIS — F32A Depression, unspecified: Secondary | ICD-10-CM

## 2014-07-07 DIAGNOSIS — F329 Major depressive disorder, single episode, unspecified: Secondary | ICD-10-CM

## 2014-07-07 NOTE — Telephone Encounter (Signed)
SHE CAME IN TO SEE DR LOWNE FOR DEPRESSION  SO I GUESS THAT WOULD BE THE DIAGNOSIS FOR THIS REFERRAL

## 2014-07-07 NOTE — Telephone Encounter (Signed)
Ref placed.      KP 

## 2014-07-07 NOTE — Telephone Encounter (Signed)
Please advise      KP 

## 2014-07-07 NOTE — Telephone Encounter (Signed)
NO SHE DID NOT GIVE A DIAGNOSIS SHE WAS TOLD BY ONE OF OUR SCHEDULERS THAT THIS HAD ALREADY BEEN DONE SO BY THE TIME SHE GOT TO ME SHE WAS VERY ANGRY

## 2014-07-07 NOTE — Telephone Encounter (Signed)
What type of referral and did she give to a diagnosis?    KP

## 2014-07-07 NOTE — Telephone Encounter (Signed)
Pt call psych themselves

## 2014-07-07 NOTE — Telephone Encounter (Signed)
Caller name:Lajuanda Relationship to patient:SELF Can be [phone] Pharmacy:  Reason for call:SHE NEEDS A REFERRAL FAXED TO DR Lethea Killings [phone] IS THE PHONE   [facility] IS REQUIRING A REFERRAL FROM DR LOWNE PRIOR TO SEE THE PATIENT

## 2014-07-10 ENCOUNTER — Ambulatory Visit (HOSPITAL_COMMUNITY): Payer: BLUE CROSS/BLUE SHIELD | Admitting: Physician Assistant

## 2014-07-13 ENCOUNTER — Ambulatory Visit (INDEPENDENT_AMBULATORY_CARE_PROVIDER_SITE_OTHER): Payer: BLUE CROSS/BLUE SHIELD | Admitting: Obstetrics & Gynecology

## 2014-07-13 ENCOUNTER — Encounter: Payer: Self-pay | Admitting: Obstetrics & Gynecology

## 2014-07-13 VITALS — BP 138/93 | HR 101 | Resp 16 | Ht 64.0 in | Wt 212.0 lb

## 2014-07-13 DIAGNOSIS — Z01419 Encounter for gynecological examination (general) (routine) without abnormal findings: Secondary | ICD-10-CM

## 2014-07-13 DIAGNOSIS — Z30432 Encounter for removal of intrauterine contraceptive device: Secondary | ICD-10-CM

## 2014-07-13 DIAGNOSIS — R102 Pelvic and perineal pain: Secondary | ICD-10-CM | POA: Diagnosis not present

## 2014-07-13 DIAGNOSIS — R200 Anesthesia of skin: Secondary | ICD-10-CM

## 2014-07-13 DIAGNOSIS — Z124 Encounter for screening for malignant neoplasm of cervix: Secondary | ICD-10-CM

## 2014-07-13 DIAGNOSIS — Z1151 Encounter for screening for human papillomavirus (HPV): Secondary | ICD-10-CM | POA: Diagnosis not present

## 2014-07-13 NOTE — Progress Notes (Signed)
   Subjective:    Patient ID: Michelle Mueller, female    DOB: July 11, 1965, 49 y.o.   MRN: [id number]  HPI  49 yo who is here because she has numbness on the labia majora, mostly on the left. She also told me that she used a vibrator and couldn't even feel it.  She is abstinent. She has some occasional issues for the last 2-3 months of having difficulty getting her bladder completely empty. She has some pelvic discomfort for the last 6 weeks.   Review of Systems She is disability with RA and fibromyalgia and back pain.     Objective:   Physical Exam  [id number] Breathing and ambulating normally Mirena easily removed and noted to be intact Bimanual exam- NSSA, NT, no adnexal masses      Assessment & Plan:  Pelvic discomfort-  I offered to schedule gyn u/s but she does not want the added expense Numbness- I am convinced that this is from a spinal issue, not from her Mirena, but I removed it as she requested. She is aware that she is fertile as of right now. Preventative care- pap smear done today

## 2014-07-14 ENCOUNTER — Ambulatory Visit: Payer: BLUE CROSS/BLUE SHIELD | Admitting: Family Medicine

## 2014-07-15 LAB — CYTOLOGY - PAP

## 2014-07-23 ENCOUNTER — Ambulatory Visit (INDEPENDENT_AMBULATORY_CARE_PROVIDER_SITE_OTHER): Payer: BLUE CROSS/BLUE SHIELD | Admitting: Physician Assistant

## 2014-07-23 ENCOUNTER — Encounter (HOSPITAL_COMMUNITY): Payer: Self-pay | Admitting: Physician Assistant

## 2014-07-23 VITALS — BP 138/92 | HR 94 | Ht 64.0 in | Wt 213.0 lb

## 2014-07-23 DIAGNOSIS — F331 Major depressive disorder, recurrent, moderate: Secondary | ICD-10-CM

## 2014-07-23 MED ORDER — BUPROPION HCL ER (XL) 150 MG PO TB24: 150.0000 mg | ORAL_TABLET | Freq: Every day | ORAL | Status: DC

## 2014-07-23 NOTE — Progress Notes (Signed)
Psychiatric Initial Adult Assessment   Patient Identification: Michelle Mueller MRN:  [id number] Date of Evaluation:  07/23/2014 Referral Source: [name] Chief Complaint:   Chief Complaint    Depression     Visit Diagnosis: MDD Diagnosis:   Patient Active Problem List   Diagnosis Date Noted  . UTI (urinary tract infection) [N39.0] 08/04/2013  . Fibromyalgia muscle pain [M79.7] 06/13/2013  . Severe depression [F32.9] 06/13/2013  . Diabetes mellitus, type II [E11.9] 06/13/2013  . Rheumatoid arthritis [M06.9] 03/19/2013  . Fibromyalgia [M79.7] 03/19/2013  . Unspecified hypothyroidism [E03.9] 03/19/2013  . Goiter [E04.9] 03/19/2013  . Obesity (BMI 30-39.9) [E66.9] 03/19/2013  . 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 Family history of pancreatic cancer [Z80.0] 01/18/2011  . Chronic diarrhea [K52.9] 01/18/2011  . ACUTE PHARYNGITIS [J02.9] 10/24/2010   History of Present Illness:   She has been off of psychiatric medications for approximately 7-8 years.  Currently she notes that she has depression, sadness, isolation, crying 3/7 days, feeling hopeless, appetite is poor, sleep changes, nightmares, increased anxiety, worsening pain symptoms, poor quality of life, fatigue, poor self care, difficulty getting out of bed. No SI/no HI, no AVH.  This episode is a 6-7/10. Worsening the situation is limited to no family support. Symptoms are worsening due to worsening health. She is on pain management through Dr. Clydell Hakim Elements:  Location:  out patient. Quality:  chronic. Severity:  moderate to severe. Timing:  on going. Duration:  years. Context:  worsening health, multiple medications, limited support. Associated Signs/Symptoms: Depression Symptoms:  depressed mood, anhedonia, insomnia, hypersomnia, psychomotor retardation, fatigue, feelings of worthlessness/guilt, difficulty concentrating, hopelessness, anxiety, loss of  energy/fatigue, decreased appetite, (Hypo) Manic Symptoms:  none Anxiety Symptoms:  Excessive Worry, Psychotic Symptoms:  none PTSD Symptoms: Negative  Past Medical History:  Past Medical History  Diagnosis Date  . Ovarian cyst   . Diabetes mellitus without complication   . Hypothyroidism   . PONV (postoperative nausea and vomiting)   . Asthma     last attack 3 yrs ago  . Hypertension     [name]  . H/O hiatal hernia   . Goiter   . Arthritis   . Chicken pox   . Anxiety   . Depression   . Fibromyalgia     Past Surgical History  Procedure Laterality Date  . Cholecystectomy    . Cesarean section    . Tonsillectomy    . Hernia repair    . Lipoma excision      L breast  . Tumor excision      Nerve sheath tumor, R arm  . Anterior lumbar fusion  12/18/2011    Procedure: ANTERIOR LUMBAR FUSION 1 LEVEL;  Surgeon: [name], MD;  Location: [facility];  Service: Neurosurgery;  Laterality: N/A;  Lumbar four-five Anterior Lumbar Interbody Fusion with Dr. [name] to do anterior exposure  . Abdominal exposure  12/18/2011    Procedure: ABDOMINAL EXPOSURE;  Surgeon: [name], MD;  Location: [facility];  Service: Vascular;  Laterality: N/A;  Anterior Expossure for anterior lumbar interbody fuson   Family History:  Family History  Problem Relation Age of Onset  . Heart disease Father   . Lung cancer Father   . Colon cancer Maternal Aunt   . Pancreatic cancer Maternal Grandmother   . Pancreatic cancer Maternal Grandfather   . Pancreatic cancer Paternal Grandmother   . Cancer Paternal Grandfather    Social History:   History  Social History  . Marital Status: Divorced    Spouse Name: N/A  . Number of Children: N/A  . Years of Education: N/A   Social History Main Topics  . Smoking status: Never Smoker   . Smokeless tobacco: Never Used  . Alcohol Use: No  . Drug Use: No  . Sexual Activity: No   Other Topics Concern  . 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  She lives in [place] with her 2 year old son.  She has an older son Michelle Mueller who is 54. She is very close to her sons, but not her birth family.  She is on disability due to multiple medical problems for the past 3 years.  She was initially treated for depression at age 69 upon her first hospitalization for a suicide attempt by cutting her wrists. Her second hospitalization was in '92/'93 at [facility] in [place] at age 78 for suicidal ideation with husband's gun in her hand. She has had 3 other admissions as well with a third suicide attempt from an intentional overdose of Tegratol in '97/'98, in [place]. Her last admission was in [place] at [facility] in 2005, for depression with an accidental overdose of Ambien.     ROS  Blood pressure 138/92, pulse 94, height 5\' 4"  (1.626 m), weight 213 lb (96.616 kg), SpO2 95 %.Body mass index is 36.54 kg/(m^2).  General Appearance: Well Groomed  Eye Contact:  Good  Speech:  Clear and Coherent  Volume:  Normal  Mood:  Depressed  Affect:  Tearful  Thought Process:  Coherent, Goal Directed, Intact, Linear and Logical  Orientation:  Full (Time, Place, and Person)  Thought Content:  WDL  Suicidal Thoughts:  No  Homicidal Thoughts:  No  Memory:  Immediate;   Good Recent;   Good Remote;   Good  Judgement:  Good  Insight:  Present  Psychomotor Activity:  Decreased  Concentration:  Fair  Recall:  Good  Fund of Knowledge:Good  Language: Good  Akathisia:  No  Handed:  Left  AIMS (if indicated):    Assets:  Communication Skills Desire for Improvement Financial  Resources/Insurance Housing Resilience Transportation  ADL's:  Intact  Cognition: WNL  Sleep:  poor   Is the patient at risk to self?  No. Has the patient been a risk to self in the past 6 months?  No. Has the patient been a risk to self within the distant past?  Yes.   Is the patient a risk to others?  No. Has the patient been a risk to others in the past 6 months?  No. Has the patient been a risk to others within the distant past?  No.  Allergies:   Allergies  Allergen Reactions  . Erythromycin Other (See Comments)    arrhythmia  . Zocor  [Simvastatin-High Dose] Rash  . Doxycycline Other (See Comments)    Decreased BP and caused dizziness per patient   Current Medications: Current Outpatient Prescriptions  Medication Sig Dispense Refill  . atorvastatin (LIPITOR) 10 MG tablet Take 1 tablet (10 mg total) by mouth daily. 30 tablet 5  . Cholecalciferol (VITAMIN D) 2000 UNITS CAPS Take 1 capsule by  mouth daily.    . cyclobenzaprine (FLEXERIL) 10 MG tablet Take 5 mg by mouth at bedtime.    . gabapentin (NEURONTIN) 300 MG capsule Take 3 capsules by mouth at bedtime.     [name] glucose blood (ONE TOUCH ULTRA TEST) test strip Test blood sugar once daily. Dx code: 250.00 100 each 12  . hydroxychloroquine (PLAQUENIL) 200 MG tablet Take 1.5 tablets by mouth daily.  2  . levothyroxine (SYNTHROID, LEVOTHROID) 50 MCG tablet Take 1 tablet by mouth daily.  11  . lisinopril (PRINIVIL,ZESTRIL) 5 MG tablet Take 1 tablet by mouth daily.  3  . metFORMIN (GLUCOPHAGE-XR) 500 MG 24 hr tablet Take 500 mg by mouth every evening.     . Multiple Vitamin (MULTIVITAMIN WITH MINERALS) TABS Take 1 tablet by mouth daily.    Glory Rosebush DELICA LANCETS 70J MISC Test blood sugar once daily. Dx code: 250.00 100 each 12  . OVER THE COUNTER MEDICATION Vitamin B-12 3000 mcg 1 table daily.    [name] oxyCODONE 10 MG TABS Take 1 tablet (10 mg total) by mouth every 6 (six) hours as needed (for pain). 60 tablet 0  . ALPRAZolam  (XANAX) 0.5 MG tablet Take 1 tablet (0.5 mg total) by mouth 3 (three) times daily as needed for anxiety. (Patient not taking: Reported on 07/23/2014) 40 tablet 0  . cetirizine (ZYRTEC) 5 MG tablet Take 5 mg by mouth daily.    [name] levonorgestrel (MIRENA) 20 MCG/24HR IUD 1 each by Intrauterine route once. Implanted end of April 2014    . NONFORMULARY OR COMPOUNDED ITEM Blood pressure cuff--automatic (Patient not taking: Reported on 07/23/2014) 1 each 0  . Phenylephrine-Acetaminophen (TYLENOL SINUS CONGESTION/PAIN PO) Take 2 tablets by mouth daily as needed (for sinus congestion).     [name] sulfaSALAzine (AZULFIDINE) 500 MG EC tablet Take 2 tablets by mouth 2 (two) times daily.  1  . Vortioxetine HBr (BRINTELLIX) 10 MG TABS 1 po qd (Patient not taking: Reported on 07/23/2014) 30 tablet 2   No current facility-administered medications for this visit.    Previous Psychotropic Medications: Yes  Prozac, Zoloft, Tegratol, Wellbutrin, Paxil, Effexor, Cymbalta, Lexapro, Depakote, Seroquel, buspar buspar-vertigo Zoloft-no sleep for 3 days Effexor-dizziness and increase in anxiety Cymbalta-dizziness and sedated Wellbutrin worked the best. Substance Abuse History in the last 12 months:  No.  Consequences of Substance Abuse: NA  Medical Decision Making:  Review of Psycho-Social Stressors (1), Established Problem, Worsening (2) and Review of Medication Regimen & Side Effects (2)  Treatment Plan Summary: Medication management Out patient therapy patient is referred to IOP at [facility] in [place].  Dellia Nims has the patient's information and will contact her regarding the start time. She is given this information as well as [facility] info for her to consider becoming a peer support. Will initiate Wellbutrin 150 XL 1 po qd. #30.  She will follow up with IOP. IOP will schedule her to follow up upon completion of IOP. Patient will go to nearest ED if symptoms increase or worsen. There is no concern for safety at this  time.   Marlane Hatcher. Nathalee Smarr RPAC 3:17 PM 07/23/2014

## 2014-08-16 ENCOUNTER — Other Ambulatory Visit (HOSPITAL_COMMUNITY): Payer: Self-pay | Admitting: Physician Assistant

## 2014-08-24 ENCOUNTER — Ambulatory Visit (INDEPENDENT_AMBULATORY_CARE_PROVIDER_SITE_OTHER): Payer: BLUE CROSS/BLUE SHIELD | Admitting: Psychiatry

## 2014-08-24 ENCOUNTER — Encounter (HOSPITAL_COMMUNITY): Payer: Self-pay | Admitting: Psychiatry

## 2014-08-24 VITALS — BP 120/85 | HR 80 | Ht 64.0 in | Wt 215.0 lb

## 2014-08-24 DIAGNOSIS — G8929 Other chronic pain: Secondary | ICD-10-CM

## 2014-08-24 DIAGNOSIS — M549 Dorsalgia, unspecified: Secondary | ICD-10-CM | POA: Diagnosis not present

## 2014-08-24 DIAGNOSIS — F411 Generalized anxiety disorder: Secondary | ICD-10-CM

## 2014-08-24 DIAGNOSIS — F329 Major depressive disorder, single episode, unspecified: Secondary | ICD-10-CM | POA: Diagnosis not present

## 2014-08-24 DIAGNOSIS — F331 Major depressive disorder, recurrent, moderate: Secondary | ICD-10-CM

## 2014-08-24 MED ORDER — BUPROPION HCL ER (XL) 150 MG PO TB24: 150.0000 mg | ORAL_TABLET | Freq: Every day | ORAL | Status: DC

## 2014-08-24 NOTE — Progress Notes (Signed)
Patient ID: Michelle Mueller, female   DOB: 1965-05-21, 49 y.o.   MRN: [id number]  Psychiatric Outpatient Follow up Visit  Patient Identification: Michelle Mueller MRN:  [id number] Date of Evaluation:  08/24/2014 Referral Source: Dr. Lennox Mueller Chief Complaint:    Visit Diagnosis: MDD. GAD . Mood related to Chronic back pain Diagnosis:   Patient Active Problem List   Diagnosis Date Noted  . UTI (urinary tract infection) [N39.0] 08/04/2013  . Fibromyalgia muscle pain [M79.7] 06/13/2013  . Severe depression [F32.9] 06/13/2013  . Diabetes mellitus, type II [E11.9] 06/13/2013  . Rheumatoid arthritis [M06.9] 03/19/2013  . Fibromyalgia [M79.7] 03/19/2013  . Unspecified hypothyroidism [E03.9] 03/19/2013  . Goiter [E04.9] 03/19/2013  . Obesity (BMI 30-39.9) [E66.9] 03/19/2013  . 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 Family history of pancreatic cancer [Z80.0] 01/18/2011  . Chronic diarrhea [K52.9] 01/18/2011  . ACUTE PHARYNGITIS [J02.9] 10/24/2010   History of Present Illness:   Patient returns for follow up visit. Initially seen by Michelle Mueller with the following " She has been off of psychiatric medications for approximately 7-8 years.  Currently she notes that she has depression, sadness, isolation, crying 3/7 days, feeling hopeless, appetite is poor, sleep changes, nightmares, increased anxiety, worsening pain symptoms, poor quality of life, fatigue, poor self care, difficulty getting out of bed. No SI/no HI, no AVH"   She is on pain management through Michelle Mueller  She was started on Wellbutrin 150 mg for depression since it has helped in the past. Wellbutrin did help her depression and she is not feeling hopeless or having significant crying spells. She does not endorse side effects of increase in anxiety. She does endorse worries, excessive at times she has presented initially also along with depression that she was feeling anxious. She worries about finances and also  her back pain including fibromyalgia. States her parents or support system leave her when she is having an exacerbation of arthritis and fibromyalgia and she only has one support that is her kids Aggravating factor; her back pain. Sleep issues. Finances. Severity of depression. -5/10. 10 being no depression  anxiety : 4/10 . 10 being extreme anxiety Medical complexity: back pain and fibromyalgia effects her sleep and depression.  (Hypo) Manic Symptoms:  none Anxiety Symptoms:  Excessive Worry, Psychotic Symptoms:  none PTSD Symptoms: Negative  Past Medical History:  Past Medical History  Diagnosis Date  . Ovarian cyst   . Diabetes mellitus without complication   . Hypothyroidism   . PONV (postoperative nausea and vomiting)   . Asthma     last attack 3 yrs ago  . Hypertension     dr Michelle Mueller  . H/O hiatal hernia   . Goiter   . Arthritis   . Chicken pox   . Anxiety   . Depression   . Fibromyalgia     Past Surgical History  Procedure Laterality Date  . Cholecystectomy    . Cesarean section    . Tonsillectomy    . Hernia repair    . Lipoma excision      L breast  . Tumor excision      Nerve sheath tumor, R arm  . Anterior lumbar fusion  12/18/2011    Procedure: ANTERIOR LUMBAR FUSION 1 LEVEL;  Surgeon: Michelle Miss, MD;  Location: [facility];  Service: Neurosurgery;  Laterality: N/A;  Lumbar four-five Anterior Lumbar Interbody Fusion with Dr. Donnetta Mueller to do anterior exposure  . Abdominal exposure  12/18/2011  Procedure: ABDOMINAL EXPOSURE;  Surgeon: Michelle Posner, MD;  Location: [facility];  Service: Vascular;  Laterality: N/A;  Anterior Expossure for anterior lumbar interbody fuson   Family History:  Family History  Problem Relation Age of Onset  . Heart disease Father   . Lung cancer Father   . Colon cancer Maternal Aunt   . Pancreatic cancer Maternal Grandmother   . Pancreatic cancer Maternal Grandfather   . Pancreatic cancer Paternal Grandmother   . Cancer  Paternal Grandfather    Social History:   History   Social History  . Marital Status: Divorced    Spouse Name: N/A  . Number of Children: N/A  . Years of Education: N/A   Social History Main Topics  . Smoking status: Never Smoker   . Smokeless tobacco: Never Used  . Alcohol Use: No  . Drug Use: No  . Sexual Activity: No   Other Topics Concern  . 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  She lives in [place] with her 47 year old son.  She has an older son Michelle Mueller who is 83. She is very close to her sons, but not her birth family.  She is on disability due to multiple medical problems for the past 3 years.  She was initially treated for depression at age 71 upon her first hospitalization for a suicide attempt by cutting her wrists. Her second hospitalization was in '92/'93 at [facility] in [place] at age 17 for suicidal ideation with husband's gun in her hand. She has had 3 other admissions as well with a third suicide attempt from an intentional overdose of Tegratol in '97/'98, in [place]. Her last admission was in [place] at [facility] in 2005, for depression with an accidental overdose of Ambien.     Review of Systems  Constitutional: Negative for fever.  Gastrointestinal: Negative for nausea.  Musculoskeletal: Positive for back pain.  Psychiatric/Behavioral: Positive for depression.    Blood pressure 120/85, pulse 80, height 5\' 4"  (1.626 m), weight 215 lb (97.523 kg).Body mass index is 36.89 kg/(m^2).  General Appearance: Well Groomed  Eye Contact:  Good  Speech:  Clear and Coherent  Volume:  Normal  Mood:  Depressed but not  hopeless  Affect:  Tearful  Thought Process:  Coherent, Goal Directed, Intact, Linear and Logical  Orientation:  Full (Time, Place, and Person)  Thought Content:  WDL  Suicidal Thoughts:  No  Homicidal Thoughts:  No  Memory:  Immediate;   Good Recent;   Good Remote;   Good  Judgement:  Good  Insight:  Present  Psychomotor Activity:  Decreased  Concentration:  Fair  Recall:  Good  Fund of Knowledge:Good  Language: Good  Akathisia:  No  Handed:  Left  AIMS (if indicated):    Assets:  Communication Skills Desire for Improvement Financial Resources/Insurance Housing Resilience Transportation  ADL's:  Intact  Cognition: WNL  Sleep:  poor   Is the patient at risk to self?  No. Has the patient been a risk to self in the past 6 months?  No. Has the patient been a risk to self within the distant past?  Yes.   Is  the patient a risk to others?  No. Has the patient been a risk to others in the past 6 months?  No. Has the patient been a risk to others within the distant past?  No.  Allergies:   Allergies  Allergen Reactions  . Erythromycin Other (See Comments)    arrhythmia  . Zocor  [Simvastatin-High Dose] Rash  . Doxycycline Other (See Comments)    Decreased BP and caused dizziness per patient   Current Medications: Current Outpatient Prescriptions  Medication Sig Dispense Refill  . atorvastatin (LIPITOR) 10 MG tablet Take 1 tablet (10 mg total) by mouth daily. 30 tablet 5  . buPROPion (WELLBUTRIN XL) 150 MG 24 hr tablet Take 1 tablet (150 mg total) by mouth daily. 30 tablet 0  . cetirizine (ZYRTEC) 5 MG tablet Take 5 mg by mouth daily.    . Cholecalciferol (VITAMIN D) 2000 UNITS CAPS Take 1 capsule by mouth daily.    . cyclobenzaprine (FLEXERIL) 10 MG tablet Take 5 mg by mouth at bedtime.    . gabapentin (NEURONTIN) 300 MG capsule Take 3 capsules by mouth at bedtime.     [name] glucose blood (ONE TOUCH ULTRA TEST) test strip Test blood sugar once daily. Dx code: 250.00 100  each 12  . hydroxychloroquine (PLAQUENIL) 200 MG tablet Take 1.5 tablets by mouth daily.  2  . levonorgestrel (MIRENA) 20 MCG/24HR IUD 1 each by Intrauterine route once. Implanted end of April 2014    . levothyroxine (SYNTHROID, LEVOTHROID) 50 MCG tablet Take 1 tablet by mouth daily.  11  . lisinopril (PRINIVIL,ZESTRIL) 5 MG tablet Take 1 tablet by mouth daily.  3  . metFORMIN (GLUCOPHAGE-XR) 500 MG 24 hr tablet Take 500 mg by mouth every evening.     . Multiple Vitamin (MULTIVITAMIN WITH MINERALS) TABS Take 1 tablet by mouth daily.    . NONFORMULARY OR COMPOUNDED ITEM Blood pressure cuff--automatic (Patient not taking: Reported on 07/23/2014) 1 each 0  . ONETOUCH DELICA LANCETS 08M MISC Test blood sugar once daily. Dx code: 250.00 100 each 12  . OVER THE COUNTER MEDICATION Vitamin B-12 3000 mcg 1 table daily.    [name] oxyCODONE 10 MG TABS Take 1 tablet (10 mg total) by mouth every 6 (six) hours as needed (for pain). 60 tablet 0  . Phenylephrine-Acetaminophen (TYLENOL SINUS CONGESTION/PAIN PO) Take 2 tablets by mouth daily as needed (for sinus congestion).     [name] sulfaSALAzine (AZULFIDINE) 500 MG EC tablet Take 2 tablets by mouth 2 (two) times daily.  1   No current facility-administered medications for this visit.    Previous Psychotropic Medications: Yes  Prozac, Zoloft, Tegratol, Wellbutrin, Paxil, Effexor, Cymbalta, Lexapro, Depakote, Seroquel, buspar buspar-vertigo Zoloft-no sleep for 3 days Effexor-dizziness and increase in anxiety Cymbalta-dizziness and sedated Wellbutrin worked the best. Substance Abuse History in the last 12 months:  No.  Consequences of Substance Abuse: NA  Medical Decision Making:  Review of Psycho-Social Stressors (1), Established Problem, Worsening (2) and Review of Medication Regimen & Side Effects (2)  Treatment Plan Summary: Medication management Out patient therapy was referred she did not follow. Says she is doing some better    Continue  Wellbutrin  150 XL 1 po qd. #30 for depression..  Says not to increase as it may effect her anxiety No clear manic symptoms in past.  GAD: She is on gabapentin for Pain and fibromyalgia . Gabapentin also may be helping for anxiety and mood stabilization.  Limit use of narcotics for pain  and follow closely with pain management clinic and recommendations.  50% time spent coordination of care and counselling, including review of medications /side effects and sleep hygiene.  Patient will go to nearest ED if symptoms increase or worsen. There is no concern for safety at this time.  Time spent: 25 min.  Merian Capron, MD  3:53 PM 08/24/2014

## 2014-08-25 ENCOUNTER — Other Ambulatory Visit (HOSPITAL_BASED_OUTPATIENT_CLINIC_OR_DEPARTMENT_OTHER): Payer: Self-pay | Admitting: Rheumatology

## 2014-08-25 DIAGNOSIS — M25532 Pain in left wrist: Secondary | ICD-10-CM

## 2014-08-29 ENCOUNTER — Ambulatory Visit (HOSPITAL_BASED_OUTPATIENT_CLINIC_OR_DEPARTMENT_OTHER)
Admission: RE | Admit: 2014-08-29 | Discharge: 2014-08-29 | Disposition: A | Discharge status: Home / Self Care | Payer: BLUE CROSS/BLUE SHIELD | Source: Ambulatory Visit | Attending: Rheumatology | Admitting: Rheumatology

## 2014-08-29 DIAGNOSIS — M19042 Primary osteoarthritis, left hand: Secondary | ICD-10-CM | POA: Diagnosis not present

## 2014-08-29 DIAGNOSIS — M25532 Pain in left wrist: Secondary | ICD-10-CM | POA: Diagnosis present

## 2014-08-29 DIAGNOSIS — M659 Synovitis and tenosynovitis, unspecified: Secondary | ICD-10-CM | POA: Diagnosis not present

## 2014-08-29 DIAGNOSIS — M67432 Ganglion, left wrist: Secondary | ICD-10-CM | POA: Insufficient documentation

## 2014-09-08 ENCOUNTER — Other Ambulatory Visit: Payer: Self-pay | Admitting: Family Medicine

## 2014-09-24 ENCOUNTER — Ambulatory Visit (INDEPENDENT_AMBULATORY_CARE_PROVIDER_SITE_OTHER): Payer: BLUE CROSS/BLUE SHIELD | Admitting: Psychiatry

## 2014-09-24 ENCOUNTER — Encounter (HOSPITAL_COMMUNITY): Payer: Self-pay | Admitting: Psychiatry

## 2014-09-24 DIAGNOSIS — M549 Dorsalgia, unspecified: Secondary | ICD-10-CM

## 2014-09-24 DIAGNOSIS — F331 Major depressive disorder, recurrent, moderate: Secondary | ICD-10-CM | POA: Diagnosis not present

## 2014-09-24 DIAGNOSIS — G8929 Other chronic pain: Secondary | ICD-10-CM

## 2014-09-24 DIAGNOSIS — F411 Generalized anxiety disorder: Secondary | ICD-10-CM | POA: Diagnosis not present

## 2014-09-24 MED ORDER — BUPROPION HCL ER (XL) 150 MG PO TB24: ORAL_TABLET | ORAL | Status: DC

## 2014-09-24 NOTE — Progress Notes (Signed)
Patient ID: Michelle Mueller, female   DOB: 1965-07-17, 49 y.o.   MRN: [id number]  Psychiatric Outpatient Follow up Visit  Patient Identification: Michelle Mueller MRN:  [id number] Date of Evaluation:  09/24/2014 Referral Source: Michelle Mueller Chief Complaint:   Chief Complaint    Follow-up     Visit Diagnosis: MDD. GAD . Mood related to Chronic back pain Diagnosis:   Patient Active Problem List   Diagnosis Date Noted  . UTI (urinary tract infection) [N39.0] 08/04/2013  . Fibromyalgia muscle pain [M79.7] 06/13/2013  . Severe depression [F32.9] 06/13/2013  . Diabetes mellitus, type II [E11.9] 06/13/2013  . Rheumatoid arthritis [M06.9] 03/19/2013  . Fibromyalgia [M79.7] 03/19/2013  . Unspecified hypothyroidism [E03.9] 03/19/2013  . Goiter [E04.9] 03/19/2013  . Obesity (BMI 30-39.9) [E66.9] 03/19/2013  . 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 Family history of pancreatic cancer [Z80.0] 01/18/2011  . Chronic diarrhea [K52.9] 01/18/2011  . ACUTE PHARYNGITIS [J02.9] 10/24/2010   History of Present Illness:   Patient returns for follow up visit. Initially seen by Michelle Mueller with the following " She initially presented as having  been off of psychiatric medications for approximately 7-8 years.  She had recurrence of depression and feeling of down. Pain exacerbated her depression and tiredness.    She is on pain management through Michelle Mueller  She was started on Wellbutrin 150 mg for depression since it has helped in the past. Wellbutrin did help her depression and she is not feeling worthless or having significant crying spells. She does not endorse side effects of increase in anxiety. Still concerned about her pain has made appointment with [facility] but that is in February. She is on multiple medications but her focus remains pain which affects her mood She does endorse worries, excessive at times she has presented initially also along with depression that she was feeling  anxious. She worries about finances and also her back pain including fibromyalgia. States her parents or support system leave her when she is having an exacerbation of arthritis and fibromyalgia and she only has one support that is her kids Aggravating factor; her back pain. Sleep issues. Finances. Severity of depression. -5/10. 10 being no depression  anxiety : 4/10 . 10 being extreme anxiety Medical complexity: back pain and fibromyalgia effects her sleep and depression.  (Hypo) Manic Symptoms:  none Anxiety Symptoms:  Excessive Worry, Psychotic Symptoms:  none PTSD Symptoms: Negative  Past Medical History:  Past Medical History  Diagnosis Date  . Ovarian cyst   . Diabetes mellitus without complication   . Hypothyroidism   . PONV (postoperative nausea and vomiting)   . Asthma     last attack 3 yrs ago  . Hypertension     dr Michelle Mueller  . H/O hiatal hernia   . Goiter   . Arthritis   . Chicken pox   . Anxiety   . Depression   . Fibromyalgia     Past Surgical History  Procedure Laterality Date  . Cholecystectomy    . Cesarean section    . Tonsillectomy    . Hernia repair    . Lipoma excision      L breast  . Tumor excision      Nerve sheath tumor, R arm  . Anterior lumbar fusion  12/18/2011    Procedure: ANTERIOR LUMBAR FUSION 1 LEVEL;  Surgeon: Michelle Miss, MD;  Location: [facility];  Service: Neurosurgery;  Laterality: N/A;  Lumbar four-five Anterior Lumbar Interbody Fusion  with Dr. Donnetta Mueller to do anterior exposure  . Abdominal exposure  12/18/2011    Procedure: ABDOMINAL EXPOSURE;  Surgeon: Michelle Posner, MD;  Location: [facility];  Service: Vascular;  Laterality: N/A;  Anterior Expossure for anterior lumbar interbody fuson   Family History:  Family History  Problem Relation Age of Onset  . Heart disease Father   . Lung cancer Father   . Colon cancer Maternal Aunt   . Pancreatic cancer Maternal Grandmother   . Pancreatic cancer Maternal Grandfather   . Pancreatic  cancer Paternal Grandmother   . Cancer Paternal Grandfather    Social History:   Social History   Social History  . Marital Status: Divorced    Spouse Name: N/A  . Number of Children: N/A  . Years of Education: N/A   Social History Main Topics  . Smoking status: Never Smoker   . Smokeless tobacco: Never Used  . Alcohol Use: No  . Drug Use: No  . Sexual Activity: No   Other Topics Concern  . Not on file   Social History Narrative   Additional Social History: 2 semesters of college, former Web designer  Musculoskeletal: Strength & Muscle Tone: within normal limits Gait & Station: normal Patient leans: N/A  Psychiatric Specialty Exam: HPI Comments: Michelle Mueller is a 49 year old DWF who is referred by her PCP for treatment of long standing depression.  She lives in [place] with her 77 year old son.  She has an older son Michelle Mueller who is 59. She is very close to her sons, but not her birth family.  She is on disability due to multiple medical problems for the past 3 years.  She was initially treated for depression at age 40 upon her first hospitalization for a suicide attempt by cutting her wrists. Her second hospitalization was in '92/'93 at [facility] in [place] at age 60 for suicidal ideation with husband's gun in her hand. She has had 3 other admissions as well with a third suicide attempt from an intentional overdose of Tegratol in '97/'98, in [place]. Her last admission was in [place] at [facility] in 2005, for depression with an accidental overdose of Ambien.     Review of Systems  Constitutional: Negative for fever.  Gastrointestinal: Negative for nausea.  Musculoskeletal: Positive for back pain.  Neurological: Negative for tremors.  Psychiatric/Behavioral: Positive for depression.    There were no vitals taken for this visit.There is no weight on file to calculate BMI.  General Appearance: Well Groomed  Eye Contact:  Good  Speech:  Clear  and Coherent  Volume:  Normal  Mood:  Depressed but not hopeless  Affect:  constricted  Thought Process:  Coherent, Goal Directed, Intact, Linear and Logical  Orientation:  Full (Time, Place, and Person)  Thought Content:  WDL  Suicidal Thoughts:  No  Homicidal Thoughts:  No  Memory:  Immediate;   Good Recent;   Good Remote;   Good  Judgement:  Good  Insight:  Present  Psychomotor Activity:  Decreased  Concentration:  Fair  Recall:  Good  Fund of Knowledge:Good  Language: Good  Akathisia:  No  Handed:  Left  AIMS (if indicated):    Assets:  Communication Skills Desire for Improvement Financial Resources/Insurance Housing Resilience Transportation  ADL's:  Intact  Cognition: WNL  Sleep:  poor   Is the patient at risk to self?  No. Has the patient been a risk to self in the past 6  months?  No. Has the patient been a risk to self within the distant past?  Yes.   Is the patient a risk to others?  No. Has the patient been a risk to others in the past 6 months?  No. Has the patient been a risk to others within the distant past?  No.  Allergies:   Allergies  Allergen Reactions  . Erythromycin Other (See Comments)    arrhythmia  . Zocor  [Simvastatin-High Dose] Rash  . Doxycycline Other (See Comments)    Decreased BP and caused dizziness per patient   Current Medications: Current Outpatient Prescriptions  Medication Sig Dispense Refill  . atorvastatin (LIPITOR) 10 MG tablet TAKE 1 TABLET BY MOUTH EVERY DAY 30 tablet 0  . buPROPion (WELLBUTRIN XL) 150 MG 24 hr tablet Take one and one half a day. 45 tablet 1  . cetirizine (ZYRTEC) 5 MG tablet Take 5 mg by mouth daily.    . Cholecalciferol (VITAMIN D) 2000 UNITS CAPS Take 1 capsule by mouth daily.    . cyclobenzaprine (FLEXERIL) 10 MG tablet Take 5 mg by mouth at bedtime.    . gabapentin (NEURONTIN) 300 MG capsule Take 3 capsules by mouth at bedtime.     [name] glucose blood (ONE TOUCH ULTRA TEST) test strip Test blood sugar  once daily. Dx code: 250.00 100 each 12  . hydroxychloroquine (PLAQUENIL) 200 MG tablet Take 1.5 tablets by mouth daily.  2  . levonorgestrel (MIRENA) 20 MCG/24HR IUD 1 each by Intrauterine route once. Implanted end of April 2014    . levothyroxine (SYNTHROID, LEVOTHROID) 50 MCG tablet Take 1 tablet by mouth daily.  11  . lisinopril (PRINIVIL,ZESTRIL) 5 MG tablet Take 1 tablet by mouth daily.  3  . metFORMIN (GLUCOPHAGE-XR) 500 MG 24 hr tablet Take 500 mg by mouth every evening.     . Multiple Vitamin (MULTIVITAMIN WITH MINERALS) TABS Take 1 tablet by mouth daily.    . NONFORMULARY OR COMPOUNDED ITEM Blood pressure cuff--automatic (Patient not taking: Reported on 07/23/2014) 1 each 0  . ONETOUCH DELICA LANCETS 51Z MISC Test blood sugar once daily. Dx code: 250.00 100 each 12  . OVER THE COUNTER MEDICATION Vitamin B-12 3000 mcg 1 table daily.    [name] oxyCODONE 10 MG TABS Take 1 tablet (10 mg total) by mouth every 6 (six) hours as needed (for pain). 60 tablet 0  . Phenylephrine-Acetaminophen (TYLENOL SINUS CONGESTION/PAIN PO) Take 2 tablets by mouth daily as needed (for sinus congestion).     [name] sulfaSALAzine (AZULFIDINE) 500 MG EC tablet Take 2 tablets by mouth 2 (two) times daily.  1   No current facility-administered medications for this visit.    Previous Psychotropic Medications: Yes  Prozac, Zoloft, Tegratol, Wellbutrin, Paxil, Effexor, Cymbalta, Lexapro, Depakote, Seroquel, buspar buspar-vertigo Zoloft-no sleep for 3 days Effexor-dizziness and increase in anxiety Cymbalta-dizziness and sedated Wellbutrin worked the best. Substance Abuse History in the last 12 months:  No.  Consequences of Substance Abuse: NA  Medical Decision Making:  Review of Psycho-Social Stressors (1), Established Problem, Worsening (2) and Review of Medication Regimen & Side Effects (2)  Treatment Plan Summary: Medication management Out patient therapy was referred she did not follow. Says she is doing some  better    Increase  Wellbutrin 225mg  XL for depression..   If needed we can increase it further next visit after evaluating her anxiety. She is to follow-up with pain management for her pain and any change in meds needed.  No  clear manic symptoms in past.  GAD: She is on gabapentin for Pain and fibromyalgia . Gabapentin also may be helping for anxiety and mood stabilization.  Limit use of narcotics for pain and follow closely with pain management clinic and recommendations.  50% time spent coordination of care and counselling, including review of medications /side effects and sleep hygiene.  Patient will go to nearest ED if symptoms increase or worsen. There is no concern for safety at this time. 50% time spent in counseling and coordination of care including patient education.  Time spent: 25 min.  Merian Capron, MD  3:13 PM 09/24/2014

## 2014-10-03 ENCOUNTER — Other Ambulatory Visit: Payer: Self-pay | Admitting: Family Medicine

## 2014-10-06 ENCOUNTER — Telehealth (HOSPITAL_COMMUNITY): Payer: Self-pay | Admitting: *Deleted

## 2014-10-06 NOTE — Telephone Encounter (Signed)
Called for prior authorization of Bupropion XL spoke to Kaycee who states a decision will be faxed within 72 hours.

## 2014-10-07 NOTE — Telephone Encounter (Addendum)
Received fax from [place] for an approval for Bupropion HCL ER 10/03/14-9-3/17. Contact [facility] pharmacy to inform pharmacy of prescription status. Pharmacy will notify pt. Pt has a f/u appt on 11/24/14.

## 2014-10-12 ENCOUNTER — Encounter: Payer: Self-pay | Admitting: Family Medicine

## 2014-10-21 ENCOUNTER — Other Ambulatory Visit (HOSPITAL_COMMUNITY): Payer: Self-pay | Admitting: *Deleted

## 2014-10-21 ENCOUNTER — Telehealth (HOSPITAL_COMMUNITY): Payer: Self-pay | Admitting: *Deleted

## 2014-10-21 NOTE — Telephone Encounter (Signed)
Pt left message stating she will need a 90 day supply for Wellbutrin. Called and spoke with Mickel Baas from [employer]. Per Mickel Baas, pt insurance company requires a 90 day supply. Pt will need a new prescription written.

## 2014-10-22 MED ORDER — BUPROPION HCL ER (XL) 150 MG PO TB24: ORAL_TABLET | ORAL | Status: DC

## 2014-10-22 NOTE — Telephone Encounter (Signed)
Per dr. De Nurse, pt is authorized for a 90 day supply for Wellbutrin 150mg , # 135. Prescription was sent to [facility]. Pt has a f/u appt on 11/24/14. Called and informed pt of prescription status. Pt verbalizes understanding.

## 2014-11-05 ENCOUNTER — Other Ambulatory Visit: Payer: Self-pay

## 2014-11-05 MED ORDER — ATORVASTATIN CALCIUM 10 MG PO TABS: 10.0000 mg | ORAL_TABLET | Freq: Every day | ORAL | Status: DC

## 2014-11-10 ENCOUNTER — Encounter: Payer: Self-pay | Admitting: Family Medicine

## 2014-11-10 ENCOUNTER — Ambulatory Visit (INDEPENDENT_AMBULATORY_CARE_PROVIDER_SITE_OTHER): Payer: BLUE CROSS/BLUE SHIELD | Admitting: Family Medicine

## 2014-11-10 VITALS — BP 122/88 | HR 81 | Temp 98.3°F | Wt 211.4 lb

## 2014-11-10 DIAGNOSIS — G4700 Insomnia, unspecified: Secondary | ICD-10-CM | POA: Insufficient documentation

## 2014-11-10 DIAGNOSIS — M791 Myalgia, unspecified site: Secondary | ICD-10-CM | POA: Insufficient documentation

## 2014-11-10 DIAGNOSIS — F329 Major depressive disorder, single episode, unspecified: Secondary | ICD-10-CM

## 2014-11-10 DIAGNOSIS — F322 Major depressive disorder, single episode, severe without psychotic features: Secondary | ICD-10-CM

## 2014-11-10 DIAGNOSIS — E785 Hyperlipidemia, unspecified: Secondary | ICD-10-CM | POA: Diagnosis not present

## 2014-11-10 DIAGNOSIS — E039 Hypothyroidism, unspecified: Secondary | ICD-10-CM | POA: Diagnosis not present

## 2014-11-10 DIAGNOSIS — F32A Depression, unspecified: Secondary | ICD-10-CM

## 2014-11-10 NOTE — Progress Notes (Signed)
Patient ID: Michelle Mueller, female    DOB: 1965/06/17  Age: 49 y.o. MRN: [id number]    Subjective:  Subjective HPI [name] presents for f/u labs for dm , thyroid.  She is also frustrated with rheum ----dx still unknown. Pain management says RA--- rheum says fibro.   She is being sent to [facility] for second opinion.    Pt c/o that pain is severe.    Review of Systems  Constitutional: Negative for diaphoresis, appetite change, fatigue and unexpected weight change.  Eyes: Negative for pain, redness and visual disturbance.  Respiratory: Negative for cough, chest tightness, shortness of breath and wheezing.   Cardiovascular: Negative for chest pain, palpitations and leg swelling.  Endocrine: Negative for cold intolerance, heat intolerance, polydipsia, polyphagia and polyuria.  Genitourinary: Negative for dysuria, frequency and difficulty urinating.  Musculoskeletal: Positive for myalgias, back pain and arthralgias.  Neurological: Negative for dizziness, light-headedness, numbness and headaches.  Psychiatric/Behavioral: Positive for dysphoric mood. Negative for suicidal ideas, sleep disturbance and self-injury. The patient is not nervous/anxious.   All other systems reviewed and are negative.   History Past Medical History  Diagnosis Date  . Ovarian cyst   . Diabetes mellitus without complication ([place])   . Hypothyroidism   . PONV (postoperative nausea and vomiting)   . Asthma     last attack 3 yrs ago  . Hypertension     [name]  . H/O hiatal hernia   . Goiter   . Arthritis   . Chicken pox   . Anxiety   . Depression   . Fibromyalgia     She has past surgical history that includes Cholecystectomy; Cesarean section; Tonsillectomy; Hernia repair; Lipoma excision; Tumor excision; Anterior lumbar fusion (12/18/2011); and Abdominal exposure (12/18/2011).   Her family history includes Cancer in her paternal grandfather; Colon cancer in her maternal aunt; Heart disease in her  father; Lung cancer in her father; Pancreatic cancer in her maternal grandfather, maternal grandmother, and paternal grandmother.She reports that she has never smoked. She has never used smokeless tobacco. She reports that she does not drink alcohol or use illicit drugs.  Current Outpatient Prescriptions on File Prior to Visit  Medication Sig Dispense Refill  . atorvastatin (LIPITOR) 10 MG tablet Take 1 tablet (10 mg total) by mouth daily. Repeat labs are due now 30 tablet 0  . buPROPion (WELLBUTRIN XL) 150 MG 24 hr tablet Take one and one half a day. 135 tablet 0  . cetirizine (ZYRTEC) 5 MG tablet Take 5 mg by mouth daily.    . Cholecalciferol (VITAMIN D) 2000 UNITS CAPS Take 1 capsule by mouth daily.    . cyclobenzaprine (FLEXERIL) 5 MG tablet   1  . gabapentin (NEURONTIN) 300 MG capsule Take 3 capsules by mouth at bedtime.     [name] glucose blood (ONE TOUCH ULTRA TEST) test strip Test blood sugar once daily. Dx code: 250.00 100 each 12  . hydroxychloroquine (PLAQUENIL) 200 MG tablet Take 1.5 tablets by mouth daily.  2  . levothyroxine (SYNTHROID, LEVOTHROID) 50 MCG tablet Take 1 tablet by mouth daily.  11  . lisinopril (PRINIVIL,ZESTRIL) 5 MG tablet Take 1 tablet by mouth daily.  3  . metFORMIN (GLUCOPHAGE-XR) 500 MG 24 hr tablet Take 500 mg by mouth every evening.     . Multiple Vitamin (MULTIVITAMIN WITH MINERALS) TABS Take 1 tablet by mouth daily.    . NONFORMULARY OR COMPOUNDED ITEM Blood pressure cuff--automatic 1 each 0  . ONETOUCH  DELICA LANCETS 16L MISC Test blood sugar once daily. Dx code: 250.00 100 each 12  . OVER THE COUNTER MEDICATION Vitamin B-12 3000 mcg 1 table daily.    [name] oxyCODONE 10 MG TABS Take 1 tablet (10 mg total) by mouth every 6 (six) hours as needed (for pain). 60 tablet 0  . Phenylephrine-Acetaminophen (TYLENOL SINUS CONGESTION/PAIN PO) Take 2 tablets by mouth daily as needed (for sinus congestion).      No current facility-administered medications on file prior to  visit.     Objective:  Objective Physical Exam  Constitutional: She is oriented to person, place, and time. She appears well-developed and well-nourished.  HENT:  Head: Normocephalic and atraumatic.  Eyes: Conjunctivae and EOM are normal.  Neck: Normal range of motion. Neck supple. No JVD present. Carotid bruit is not present. No thyromegaly present.  Cardiovascular: Normal rate, regular rhythm and normal heart sounds.   No murmur heard. Pulmonary/Chest: Effort normal and breath sounds normal. No respiratory distress. She has no wheezes. She has no rales. She exhibits no tenderness.  Musculoskeletal: She exhibits tenderness. She exhibits no edema.  Pt c/o pain in wrists and ankles , mostly  Neurological: She is alert and oriented to person, place, and time.  Psychiatric: Her behavior is normal. Her mood appears anxious. She exhibits a depressed mood. She expresses no homicidal and no suicidal ideation. She expresses no suicidal plans and no homicidal plans.  Teary eyed--- cries easily Not suicidal   BP 122/88 mmHg  Pulse 81  Temp(Src) 98.3 F (36.8 C) (Oral)  Wt 211 lb 6.4 oz (95.89 kg)  SpO2 98% Wt Readings from Last 3 Encounters:  11/10/14 211 lb 6.4 oz (95.89 kg)  08/24/14 215 lb (97.523 kg)  07/23/14 213 lb (96.616 kg)     Lab Results  Component Value Date   WBC 5.4 03/11/2014   HGB 13.5 03/11/2014   HCT 40.3 03/11/2014   PLT 239.0 03/11/2014   GLUCOSE 95 03/11/2014   CHOL 124 03/11/2014   TRIG 56.0 03/11/2014   HDL 55.20 03/11/2014   LDLCALC 58 03/11/2014   ALT 21 03/11/2014   AST 20 03/11/2014   NA 138 03/11/2014   K 4.0 03/11/2014   CL 107 03/11/2014   CREATININE 1.00 03/11/2014   BUN 13 03/11/2014   CO2 26 03/11/2014   TSH 1.22 03/11/2014   HGBA1C 5.5 03/11/2014   MICROALBUR 0.5 08/28/2013    Mr Wrist Left Wo Contrast  08/30/2014   CLINICAL DATA:  LEFT wrist pain. Rheumatoid arthritis history. Limited range of motion. Pain with activity.  EXAM: MR OF  THE LEFT WRIST WITHOUT CONTRAST  TECHNIQUE: Multiplanar, multisequence MR imaging of the left wrist was performed. No intravenous contrast was administered.  COMPARISON:  None.  FINDINGS: Ligaments: Scapholunate and lunotriquetral ligaments are intact and appear normal.  Triangular fibrocartilage: Small central perforation of the triangular fibrocartilage with small distal radioulnar joint effusion. Mild ulnar minus variance.  Tendons: Extensor compartments 6 tendinosis. Extensor compartment 2 and 4 mild tenosynovitis.  Carpal tunnel/median nerve: Normal.  Guyon's canal: Normal.  Joint/cartilage: Negative for synovitis. Radiocarpal articular surfaces appear normal. No chondrolysis in this patient with a history of rheumatoid arthritis. There is mild STT joint osteoarthritis, expected for age. Mild basal joint of the thumb osteoarthritis is also present.  Bones/carpal alignment: Nonspecific carpal cysts are present within the capitate. No definite erosions.  Other: Small ganglion cyst is present volar to the radioscaphoid joint. This measures 8 mm in the long  axis of the wrist, 9 mm transverse and is thin in the volar to dorsal dimension, measuring only 3 mm. No resulting mass effect.  IMPRESSION: 1. Small central perforation of the triangular fibrocartilage with distal radioulnar joint effusion. 2. Tiny ganglion cyst along the volar aspect of the radioscaphoid joint. 3. 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 4. Extensor compartments 6 tendinosis. Extensor compartment 2 and 4 mild tenosynovitis.   Electronically Signed   By: Dereck Ligas M.D.   On: 08/30/2014 14:07     Assessment & Plan:  Plan I have discontinued Ms. Ammon's levonorgestrel and sulfaSALAzine. I am also having her maintain her metFORMIN, Phenylephrine-Acetaminophen (TYLENOL SINUS CONGESTION/PAIN PO), multivitamin with minerals, Vitamin D, Oxycodone HCl, gabapentin, glucose blood, ONETOUCH DELICA LANCETS 93G,  OVER THE COUNTER MEDICATION, cetirizine, NONFORMULARY OR COMPOUNDED ITEM, lisinopril, hydroxychloroquine, levothyroxine, cyclobenzaprine, buPROPion, and atorvastatin.  No orders of the defined types were placed in this encounter.    Problem List Items Addressed This Visit    Severe depression    Pt is on wellbutrin--- how not been able to take anything else con't with counselor Pt was here for > 60 min more than 50 % time face to face discussing med issues      Myalgia    Pt with significant pain and swelling of joints at times.  She has pictures  Rheum at [facility] pending con't pain management      Insomnia    Pt had sleep study Still c/o insomnia and snoring and is requesting to see someone Refer to pulm       Relevant Orders   Ambulatory referral to Pulmonology    Other Visit Diagnoses    Depression    -  Primary    Hyperlipidemia        Relevant Orders    Comp Met (CMET)    Lipid panel    Hypothyroidism, unspecified hypothyroidism type        Relevant Orders    TSH       Follow-up: Return in about 4 weeks (around 12/08/2014), or if symptoms worsen or fail to improve, for annual exam, fasting.  Garnet Koyanagi, DO

## 2014-11-10 NOTE — Assessment & Plan Note (Signed)
Pt with significant pain and swelling of joints at times.  She has pictures  Rheum at [facility] pending con't pain management

## 2014-11-10 NOTE — Assessment & Plan Note (Signed)
Pt is on wellbutrin--- how not been able to take anything else con't with counselor Pt was here for > 60 min more than 50 % time face to face discussing med issues

## 2014-11-10 NOTE — Patient Instructions (Signed)

## 2014-11-10 NOTE — Progress Notes (Signed)
Pre visit review using our clinic review tool, if applicable. No additional management support is needed unless otherwise documented below in the visit note. 

## 2014-11-10 NOTE — Assessment & Plan Note (Signed)
Pt had sleep study Still c/o insomnia and snoring and is requesting to see someone Refer to pulm

## 2014-11-11 ENCOUNTER — Ambulatory Visit (INDEPENDENT_AMBULATORY_CARE_PROVIDER_SITE_OTHER): Payer: BLUE CROSS/BLUE SHIELD | Admitting: Obstetrics & Gynecology

## 2014-11-11 ENCOUNTER — Encounter: Payer: Self-pay | Admitting: Obstetrics & Gynecology

## 2014-11-11 VITALS — BP 113/71 | HR 86 | Resp 16 | Ht 64.0 in | Wt 213.0 lb

## 2014-11-11 DIAGNOSIS — N921 Excessive and frequent menstruation with irregular cycle: Secondary | ICD-10-CM | POA: Diagnosis not present

## 2014-11-11 DIAGNOSIS — R339 Retention of urine, unspecified: Secondary | ICD-10-CM | POA: Diagnosis not present

## 2014-11-11 LAB — COMPREHENSIVE METABOLIC PANEL
ALK PHOS: 61 U/L (ref 39–117)
ALT: 30 U/L (ref 0–35)
AST: 19 U/L (ref 0–37)
Albumin: 4 g/dL (ref 3.5–5.2)
BUN: 17 mg/dL (ref 6–23)
CHLORIDE: 104 meq/L (ref 96–112)
CO2: 27 meq/L (ref 19–32)
Calcium: 8.9 mg/dL (ref 8.4–10.5)
Creatinine, Ser: 1.06 mg/dL (ref 0.40–1.20)
GFR: 58.89 mL/min — ABNORMAL LOW (ref >60.00)
GLUCOSE: 85 mg/dL (ref 70–99)
POTASSIUM: 4.4 meq/L (ref 3.5–5.1)
Sodium: 138 mEq/L (ref 135–145)
TOTAL PROTEIN: 6.7 g/dL (ref 6.0–8.3)
Total Bilirubin: 0.3 mg/dL (ref 0.2–1.2)

## 2014-11-11 LAB — LIPID PANEL
CHOL/HDL RATIO: 2
CHOLESTEROL: 144 mg/dL (ref 0–200)
HDL: 67.9 mg/dL (ref >39.00)
LDL CALC: 61 mg/dL (ref 0–99)
NonHDL: 76.34
Triglycerides: 79 mg/dL (ref 0.0–149.0)
VLDL: 15.8 mg/dL (ref 0.0–40.0)

## 2014-11-11 LAB — POCT URINALYSIS DIPSTICK
BILIRUBIN UA: NEGATIVE
Glucose, UA: NEGATIVE
KETONES UA: NEGATIVE
LEUKOCYTES UA: NEGATIVE
Nitrite, UA: NEGATIVE
PH UA: 5
Protein, UA: NEGATIVE
Spec Grav, UA: >=1.03
Urobilinogen, UA: 0.2

## 2014-11-11 LAB — TSH: TSH: 1.9 u[IU]/mL (ref 0.35–4.50)

## 2014-11-11 LAB — POCT URINE PREGNANCY: Preg Test, Ur: NEGATIVE

## 2014-11-11 MED ORDER — MEDROXYPROGESTERONE ACETATE 10 MG PO TABS: 10.0000 mg | ORAL_TABLET | Freq: Every day | ORAL | Status: DC

## 2014-11-13 ENCOUNTER — Encounter: Payer: Self-pay | Admitting: Family Medicine

## 2014-11-13 NOTE — Progress Notes (Signed)
Subjective:    Patient ID: Michelle Mueller, female    DOB: September 15, 1965, 49 y.o.   MRN: [id number]  HPI  Pt is complaining of irregular bleeding for several months since removing her IUD.  She was hoping that removing the Mirena would help with back pain.  Unfortunately, she has not experienced an improvement in her symptoms.  She has started having noncyclical bleeding.  She did not have this bleedding while the Mirena was in situ.  There is still some back pain present.  Pt has not taken any medications to make bleeding better or worse.   Location:  Vaginal bleeding Quality:  BRB, no clots Severity:  Moderate Duration:  Since June Timing:  Not associate only with menses Modifying Factors:  none  Associated Signs and Symptoms:  None   TSH on 11/12/14 = 1.9 (nml) CMP Latest Ref Rng 11/10/2014 03/11/2014 03/02/2014  Glucose 70 - 99 mg/dL 85 95 104(H)  BUN 6 - 23 mg/dL 17 13 12   Creatinine 0.40 - 1.20 mg/dL 1.06 1.00 0.94  Sodium 135 - 145 mEq/L 138 138 138  Potassium 3.5 - 5.1 mEq/L 4.4 4.0 3.9  Chloride 96 - 112 mEq/L 104 107 107  CO2 19 - 32 mEq/L 27 26 29   Calcium 8.4 - 10.5 mg/dL 8.9 9.0 8.5  Total Protein 6.0 - 8.3 g/dL 6.7 6.7 -  Total Bilirubin 0.2 - 1.2 mg/dL 0.3 0.5 -  Alkaline Phos 39 - 117 U/L 61 55 -  AST 0 - 37 U/L 19 20 -  ALT 0 - 35 U/L 30 21 -    Review of Systems  Constitutional: Negative.   Respiratory: Negative.   Cardiovascular: Negative.   Gastrointestinal: Negative.   Genitourinary: Positive for vaginal bleeding and menstrual problem.  Neurological: Negative.   Psychiatric/Behavioral: Negative.    Past Medical History  Diagnosis Date  . Ovarian cyst   . Diabetes mellitus without complication ([place])   . Hypothyroidism   . PONV (postoperative nausea and vomiting)   . Asthma     last attack 3 yrs ago  . Hypertension     [name]  . H/O hiatal hernia   . Goiter   . Arthritis   . Chicken pox   . Anxiety   . Depression   . Fibromyalgia          Objective:   Physical Exam  Constitutional: She is oriented to person, place, and time. She appears well-developed and well-nourished. No distress.  HENT:  Head: Normocephalic and atraumatic.  Eyes: Conjunctivae are normal.  Pulmonary/Chest: Effort normal.  Abdominal: Soft. Bowel sounds are normal. She exhibits no distension and no mass. There is no tenderness. There is no rebound and no guarding.  Genitourinary:  Ext gen:  Tanner V no lesion Vulva:  No lesion Vagina:  No lesion, no bleeding, no discharge Cervix:  No lesion, no CMT, no polyp, no ectropion Uterus:  Normal size, non tender Adnexa:  No masses, non tender.  Musculoskeletal: She exhibits no edema.  Neurological: She is alert and oriented to person, place, and time.  Skin: Skin is warm and dry.  Psychiatric: She has a normal mood and affect.  Vitals reviewed.  Filed Vitals:   11/11/14 0949  BP: 113/71  Pulse: 86  Resp: 16  Height: 5\' 4"  (1.626 m)  Weight: 213 lb (96.616 kg)       Assessment & Plan:  49 yo female with irregualar bleeding non cyclical.  1- Endometrial biopsy  2-Provera for 10 days then TVUS  Endometrium, biopsy - ENDOMETRIOID TYPE POLYP(S). - THERE IS NO EVIDENCE OF HYPERPLASIA OR MALIGNANCY. Enid Cutter MD Pathologist, Electronic Signature (Case signed 11/12/2014)  Will add on Saline sono given Path result and see if polyp is seen.  ENDOMETRIAL BIOPSY     The indications for endometrial biopsy were reviewed.   Risks of the biopsy including cramping, bleeding, infection, uterine perforation, inadequate specimen and need for additional procedures  were discussed. The patient states she understands and agrees to undergo procedure today. Consent was signed. Time out was performed. Urine HCG was negative. A sterile speculum was placed in the patient's vagina and the cervix was prepped with Betadine. A single-toothed tenaculum was placed on the anterior lip of the cervix to stabilize it. The 3 mm  pipelle was introduced into the endometrial cavity without difficulty to a depth of 8cm, and a moderate amount of tissue was obtained and sent to pathology. The instruments were removed from the patient's vagina. Minimal bleeding from the cervix was noted. The patient tolerated the procedure well. Routine post-procedure instructions were given to the patient. The patient will follow up to review the results and for further management.

## 2014-11-16 ENCOUNTER — Other Ambulatory Visit: Payer: Self-pay | Admitting: Family Medicine

## 2014-11-16 ENCOUNTER — Telehealth: Payer: Self-pay | Admitting: *Deleted

## 2014-11-16 DIAGNOSIS — N939 Abnormal uterine and vaginal bleeding, unspecified: Secondary | ICD-10-CM

## 2014-11-16 DIAGNOSIS — E049 Nontoxic goiter, unspecified: Secondary | ICD-10-CM

## 2014-11-16 DIAGNOSIS — E1151 Type 2 diabetes mellitus with diabetic peripheral angiopathy without gangrene: Secondary | ICD-10-CM

## 2014-11-16 NOTE — Telephone Encounter (Signed)
Pt notified of endometrial biopsy  Results and sonohyst. scheduled per Dr Gala Romney.

## 2014-11-17 ENCOUNTER — Other Ambulatory Visit: Payer: Self-pay

## 2014-11-17 ENCOUNTER — Encounter: Payer: Self-pay | Admitting: Family Medicine

## 2014-11-17 ENCOUNTER — Encounter: Payer: Self-pay | Admitting: *Deleted

## 2014-11-17 MED ORDER — ATORVASTATIN CALCIUM 10 MG PO TABS: 10.0000 mg | ORAL_TABLET | Freq: Every day | ORAL | Status: DC

## 2014-11-18 ENCOUNTER — Other Ambulatory Visit: Payer: Self-pay | Admitting: Family Medicine

## 2014-11-18 DIAGNOSIS — Z1231 Encounter for screening mammogram for malignant neoplasm of breast: Secondary | ICD-10-CM

## 2014-11-19 ENCOUNTER — Ambulatory Visit (HOSPITAL_BASED_OUTPATIENT_CLINIC_OR_DEPARTMENT_OTHER)
Admission: RE | Admit: 2014-11-19 | Discharge: 2014-11-19 | Disposition: A | Discharge status: Home / Self Care | Payer: BLUE CROSS/BLUE SHIELD | Source: Ambulatory Visit | Attending: Family Medicine | Admitting: Family Medicine

## 2014-11-19 ENCOUNTER — Other Ambulatory Visit (INDEPENDENT_AMBULATORY_CARE_PROVIDER_SITE_OTHER): Payer: BLUE CROSS/BLUE SHIELD

## 2014-11-19 ENCOUNTER — Other Ambulatory Visit (HOSPITAL_COMMUNITY)
Admission: RE | Admit: 2014-11-19 | Discharge: 2014-11-19 | Disposition: A | Discharge status: Home / Self Care | Payer: BLUE CROSS/BLUE SHIELD | Source: Ambulatory Visit | Attending: Family Medicine | Admitting: Family Medicine

## 2014-11-19 ENCOUNTER — Telehealth: Payer: Self-pay

## 2014-11-19 DIAGNOSIS — Z1231 Encounter for screening mammogram for malignant neoplasm of breast: Secondary | ICD-10-CM

## 2014-11-19 DIAGNOSIS — E049 Nontoxic goiter, unspecified: Secondary | ICD-10-CM | POA: Diagnosis present

## 2014-11-19 DIAGNOSIS — Z202 Contact with and (suspected) exposure to infections with a predominantly sexual mode of transmission: Secondary | ICD-10-CM

## 2014-11-19 DIAGNOSIS — Z113 Encounter for screening for infections with a predominantly sexual mode of transmission: Secondary | ICD-10-CM | POA: Diagnosis present

## 2014-11-19 DIAGNOSIS — E1151 Type 2 diabetes mellitus with diabetic peripheral angiopathy without gangrene: Secondary | ICD-10-CM | POA: Diagnosis not present

## 2014-11-19 DIAGNOSIS — E042 Nontoxic multinodular goiter: Secondary | ICD-10-CM | POA: Diagnosis not present

## 2014-11-19 LAB — HEMOGLOBIN A1C: HEMOGLOBIN A1C: 5.3 % (ref 4.6–6.5)

## 2014-11-19 NOTE — Telephone Encounter (Signed)
c/o former partner + for HIV and she wanted to have testing done. Ok per [employer]. Orders are in.     KP

## 2014-11-19 NOTE — Addendum Note (Signed)
Addended by: Caffie Pinto on: 11/19/2014 02:56 PM   Modules accepted: Orders

## 2014-11-20 ENCOUNTER — Telehealth: Payer: Self-pay | Admitting: *Deleted

## 2014-11-20 DIAGNOSIS — N921 Excessive and frequent menstruation with irregular cycle: Secondary | ICD-10-CM

## 2014-11-20 LAB — HEPATITIS C ANTIBODY: HCV AB: NEGATIVE

## 2014-11-20 LAB — HIV ANTIBODY (ROUTINE TESTING W REFLEX): HIV 1&2 Ab, 4th Generation: NONREACTIVE

## 2014-11-20 LAB — RPR

## 2014-11-20 NOTE — Telephone Encounter (Signed)
Changed Hystogram to SonoHyst per order instructions and scheduling request

## 2014-11-23 ENCOUNTER — Telehealth: Payer: Self-pay | Admitting: *Deleted

## 2014-11-23 ENCOUNTER — Ambulatory Visit (HOSPITAL_COMMUNITY): Admission: RE | Admit: 2014-11-23 | Payer: BLUE CROSS/BLUE SHIELD | Source: Ambulatory Visit

## 2014-11-23 ENCOUNTER — Other Ambulatory Visit: Payer: Self-pay

## 2014-11-23 DIAGNOSIS — N938 Other specified abnormal uterine and vaginal bleeding: Secondary | ICD-10-CM

## 2014-11-23 LAB — URINE CYTOLOGY ANCILLARY ONLY
Chlamydia: NEGATIVE
Neisseria Gonorrhea: NEGATIVE

## 2014-11-23 MED ORDER — MEGESTROL ACETATE 40 MG PO TABS: 40.0000 mg | ORAL_TABLET | Freq: Every day | ORAL | Status: DC

## 2014-11-23 NOTE — Telephone Encounter (Signed)
Sent Rx for Megace for pt per Dr Gala Romney. Advised pt to go ahead and schedule [place] and then once [place] is complete, call to make appt with Dr Gala Romney to come in and talk about Tx plan. Pt expressed understanding.

## 2014-11-23 NOTE — Telephone Encounter (Signed)
Pt had SonoHyst scheduled for today but it was canceled. Pt woke up at 1:00am and changed Tampon and then woke up again with large amount of blood in bed. Pt states she has never stopped bleeding even after taking medications that were supposed to make her stop bleeding. She explained that she has had bright red vaginal bleeding since being seen here, except two days when it was a brownish colored bleeding, then 3 days ago started having large amount of bright red bleeding again. She wants to know what the plan is now that she is bleeding heavy again. I will forward to Dr. Gala Romney to review.

## 2014-11-24 ENCOUNTER — Ambulatory Visit (INDEPENDENT_AMBULATORY_CARE_PROVIDER_SITE_OTHER): Payer: BLUE CROSS/BLUE SHIELD

## 2014-11-24 ENCOUNTER — Ambulatory Visit (HOSPITAL_COMMUNITY): Payer: Self-pay | Admitting: Psychiatry

## 2014-11-24 ENCOUNTER — Other Ambulatory Visit: Payer: Self-pay

## 2014-11-24 DIAGNOSIS — R339 Retention of urine, unspecified: Secondary | ICD-10-CM | POA: Diagnosis not present

## 2014-11-24 DIAGNOSIS — D259 Leiomyoma of uterus, unspecified: Secondary | ICD-10-CM

## 2014-12-01 ENCOUNTER — Ambulatory Visit: Payer: Self-pay | Admitting: Obstetrics & Gynecology

## 2014-12-02 ENCOUNTER — Ambulatory Visit (HOSPITAL_COMMUNITY)
Admission: RE | Admit: 2014-12-02 | Discharge: 2014-12-02 | Disposition: A | Discharge status: Home / Self Care | Payer: BLUE CROSS/BLUE SHIELD | Source: Ambulatory Visit | Attending: Obstetrics & Gynecology | Admitting: Obstetrics & Gynecology

## 2014-12-02 DIAGNOSIS — N921 Excessive and frequent menstruation with irregular cycle: Secondary | ICD-10-CM

## 2014-12-03 ENCOUNTER — Other Ambulatory Visit: Payer: Self-pay | Admitting: Obstetrics & Gynecology

## 2014-12-03 DIAGNOSIS — N921 Excessive and frequent menstruation with irregular cycle: Secondary | ICD-10-CM

## 2014-12-03 DIAGNOSIS — R350 Frequency of micturition: Secondary | ICD-10-CM

## 2014-12-03 DIAGNOSIS — R300 Dysuria: Secondary | ICD-10-CM

## 2014-12-03 NOTE — Progress Notes (Unsigned)
Pt having urinary frequency and pelvic pain near bladder.  Please refer to urology for IC evaluation.  Pt needs cbc since she is bleeding to r/o anemia  Pt needs to be scheduled for IUD insertion and exam.

## 2014-12-07 ENCOUNTER — Ambulatory Visit (INDEPENDENT_AMBULATORY_CARE_PROVIDER_SITE_OTHER): Payer: BLUE CROSS/BLUE SHIELD | Admitting: Psychiatry

## 2014-12-07 ENCOUNTER — Encounter (HOSPITAL_COMMUNITY): Payer: Self-pay | Admitting: Psychiatry

## 2014-12-07 DIAGNOSIS — F411 Generalized anxiety disorder: Secondary | ICD-10-CM | POA: Diagnosis not present

## 2014-12-07 DIAGNOSIS — F331 Major depressive disorder, recurrent, moderate: Secondary | ICD-10-CM

## 2014-12-07 DIAGNOSIS — G8929 Other chronic pain: Secondary | ICD-10-CM

## 2014-12-07 DIAGNOSIS — M549 Dorsalgia, unspecified: Secondary | ICD-10-CM | POA: Diagnosis not present

## 2014-12-07 MED ORDER — LAMOTRIGINE 25 MG PO TABS: 25.0000 mg | ORAL_TABLET | Freq: Every day | ORAL | Status: DC

## 2014-12-07 NOTE — Progress Notes (Signed)
Patient ID: Michelle Mueller, female   DOB: [birth year], 49 y.o.   MRN: [id number]  Psychiatric Outpatient Follow up Visit  Patient Identification: Michelle Mueller MRN:  [id number] Date of Evaluation:  12/07/2014 Referral Source: Dr. Lennox Mueller Chief Complaint:   Chief Complaint    Follow-up     Visit Diagnosis: MDD. GAD . Mood related to Chronic back pain Diagnosis:   Patient Active Problem List   Diagnosis Date Noted  . Myalgia [M79.1] 11/10/2014  . Insomnia [G47.00] 11/10/2014  . UTI (urinary tract infection) [N39.0] 08/04/2013  . Fibromyalgia muscle pain [M79.7] 06/13/2013  . Severe depression [F32.9] 06/13/2013  . Diabetes mellitus, type II ([place]) [E11.9] 06/13/2013  . Rheumatoid arthritis ([place]) [M06.9] 03/19/2013  . Fibromyalgia [M79.7] 03/19/2013  . Unspecified hypothyroidism [E03.9] 03/19/2013  . Goiter [E04.9] 03/19/2013  . Obesity (BMI 30-39.9) [E66.9] 03/19/2013  . 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 Family history of pancreatic cancer [Z80.0] 01/18/2011  . Chronic diarrhea [K52.9] 01/18/2011  . ACUTE PHARYNGITIS [J02.9] 10/24/2010   History of Present Illness:   Patient returns for follow up visit. Initially seen by Michelle Mueller with the following " She initially presented as having  been off of psychiatric medications for approximately 7-8 years.  She had recurrence of depression and feeling of down. Pain exacerbated her depression and tiredness.    She is on pain management through Dr. Clydell Mueller  She was started on Wellbutrin 150 mg for depression since it has helped in the past. She is on 225mg  dose as of now.   Still concerned about her pain has made appointment with [facility] but that is in February. She is on multiple medications but her focus remains pain which affects her mood She continues to struggle with her mood symptoms which are also related with her physical ailment including back condition. She feels that she is limited in her activities  and that makes her upset and down including anxiety. Fibromyalgia adds more to her mood.  States her parents or support system leave her when she is having an exacerbation of arthritis and fibromyalgia and she only has one support that is her kids Aggravating factor; her back pain. Sleep issues. Finances. Severity of depression. -4/10. 10 being no depression  anxiety : 5/10 . 10 being extreme anxiety Medical complexity: back pain and fibromyalgia effects her sleep and depression.  (Hypo) Manic Symptoms:  none Anxiety Symptoms:  Excessive Worry, Psychotic Symptoms:  none PTSD Symptoms: Negative  Past Medical History:  Past Medical History  Diagnosis Date  . Ovarian cyst   . Diabetes mellitus without complication ([place])   . Hypothyroidism   . PONV (postoperative nausea and vomiting)   . Asthma     last attack 3 yrs ago  . Hypertension     dr Michelle Mueller  . H/O hiatal hernia   . Goiter   . Arthritis   . Chicken pox   . Anxiety   . Depression   . Fibromyalgia     Past Surgical History  Procedure Laterality Date  . Cholecystectomy    . Cesarean section    . Tonsillectomy    . Hernia repair    . Lipoma excision      L breast  . Tumor excision      Nerve sheath tumor, R arm  . Anterior lumbar fusion  12/18/2011    Procedure: ANTERIOR LUMBAR FUSION 1 LEVEL;  Surgeon: Michelle Miss, MD;  Location: [facility];  Service: Neurosurgery;  Laterality: N/A;  Lumbar four-five Anterior Lumbar Interbody Fusion with Dr. Donnetta Mueller to do anterior exposure  . Abdominal exposure  12/18/2011    Procedure: ABDOMINAL EXPOSURE;  Surgeon: Michelle Posner, MD;  Location: [facility];  Service: Vascular;  Laterality: N/A;  Anterior Expossure for anterior lumbar interbody fuson   Family History:  Family History  Problem Relation Age of Onset  . Heart disease Father   . Lung cancer Father   . Colon cancer Maternal Aunt   . Pancreatic cancer Maternal Grandmother   . Pancreatic cancer Maternal Grandfather    . Pancreatic cancer Paternal Grandmother   . Cancer Paternal Grandfather    Social History:   Social History   Social History  . Marital Status: Divorced    Spouse Name: N/A  . Number of Children: N/A  . Years of Education: N/A   Social History Main Topics  . Smoking status: Never Smoker   . Smokeless tobacco: Never Used  . Alcohol Use: No  . Drug Use: No  . Sexual Activity: No   Other Topics Concern  . 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  She lives in [place] with her 67 year old son.  She has an older son Michelle Mueller who is 38. She is very close to her sons, but not her birth family.  She is on disability due to multiple medical problems for the past 3 years.  She was initially treated for depression at age 54 upon her first hospitalization for a suicide attempt by cutting her wrists. Her second hospitalization was in '92/'93 at [facility] in [place] at age 53 for suicidal ideation with husband's gun in her hand. She has had 3 other admissions as well with a third suicide attempt from an intentional overdose of Tegratol in '97/'98, in [place]. Her last admission was in [place] at [facility] in 2005, for depression with an accidental overdose of Ambien.     Review of Systems  Constitutional: Negative for fever.  Cardiovascular: Negative for chest pain.  Gastrointestinal: Negative for nausea.  Musculoskeletal: Positive for back pain.  Neurological: Negative for tremors.  Psychiatric/Behavioral: Positive for depression. The patient is nervous/anxious.     Last menstrual period 11/14/2014.There is no weight on file to calculate  BMI.  General Appearance: Well Groomed  Eye Contact:  Good  Speech:  Clear and Coherent  Volume:  Normal  Mood:  Anxious and depressed  Affect:  constricted  Thought Process:  Coherent, Goal Directed, Intact, Linear and Logical  Orientation:  Full (Time, Place, and Person)  Thought Content:  WDL  Suicidal Thoughts:  No  Homicidal Thoughts:  No  Memory:  Immediate;   Good Recent;   Good Remote;   Good  Judgement:  Good  Insight:  Present  Psychomotor Activity:  Decreased  Concentration:  Fair  Recall:  Good  Fund of Knowledge:Good  Language: Good  Akathisia:  No  Handed:  Left  AIMS (if indicated):    Assets:  Communication Skills Desire for Improvement Financial Resources/Insurance Housing Resilience Transportation  ADL's:  Intact  Cognition: WNL  Sleep:  poor   Is the patient at risk to self?  No. Has the patient been a risk to self in the past 6 months?  No. Has the patient been a risk to self within the distant past?  Yes.   Is the patient a risk to others?  No. Has the patient been a risk to others in the past 6 months?  No. Has the patient been a risk to others within the distant past?  No.  Allergies:   Allergies  Allergen Reactions  . Erythromycin Other (See Comments)    arrhythmia  . Zocor  [Simvastatin-High Dose] Rash  . Doxycycline Other (See Comments)    Decreased BP and caused dizziness per patient   Current Medications: Current Outpatient Prescriptions  Medication Sig Dispense Refill  . atorvastatin (LIPITOR) 10 MG tablet Take 1 tablet (10 mg total) by mouth daily. 90 tablet 1  . buPROPion (WELLBUTRIN XL) 150 MG 24 hr tablet Take one and one half a day. 135 tablet 0  . cetirizine (ZYRTEC) 5 MG tablet Take 5 mg by mouth daily.    . Cholecalciferol (VITAMIN D) 2000 UNITS CAPS Take 1 capsule by mouth daily.    . cyclobenzaprine (FLEXERIL) 5 MG tablet   1  . gabapentin (NEURONTIN) 300 MG capsule Take 3 capsules by mouth at bedtime.     [name] glucose  blood (ONE TOUCH ULTRA TEST) test strip Test blood sugar once daily. Dx code: 250.00 100 each 12  . hydroxychloroquine (PLAQUENIL) 200 MG tablet Take 1.5 tablets by mouth daily.  2  . lamoTRIgine (LAMICTAL) 25 MG tablet Take 1 tablet (25 mg total) by mouth daily. Take one tablet daily for a week and then start taking 2 tablets. 60 tablet 0  . levothyroxine (SYNTHROID, LEVOTHROID) 50 MCG tablet Take 1 tablet by mouth daily.  11  . lisinopril (PRINIVIL,ZESTRIL) 5 MG tablet Take 1 tablet by mouth daily.  3  . medroxyPROGESTERone (PROVERA) 10 MG tablet Take 1 tablet (10 mg total) by mouth daily. Use for ten days 10 tablet 2  . megestrol (MEGACE) 40 MG tablet Take 1 tablet (40 mg total) by mouth daily. Take 40mg  three times daily for three days, then 40mg  2 times daily for two days, and then daily for 14 days. 27 tablet 0  . metFORMIN (GLUCOPHAGE-XR) 500 MG 24 hr tablet Take 500 mg by mouth every evening.     . Multiple Vitamin (MULTIVITAMIN WITH MINERALS) TABS Take 1 tablet by mouth daily.    . NONFORMULARY OR COMPOUNDED ITEM Blood pressure cuff--automatic 1 each 0  . ONETOUCH DELICA LANCETS 56D MISC Test blood sugar once daily. Dx code: 250.00 100 each 12  . OVER THE COUNTER MEDICATION Vitamin B-12 3000 mcg 1 table daily.    [name] oxyCODONE 10 MG TABS Take 1 tablet (10 mg total) by mouth every 6 (six) hours as needed (for pain). 60 tablet 0  . Phenylephrine-Acetaminophen (TYLENOL SINUS CONGESTION/PAIN PO) Take 2 tablets by mouth daily as needed (for sinus congestion).      No current facility-administered medications for this visit.    Previous Psychotropic Medications: Yes  Prozac, Zoloft, Tegratol, Wellbutrin, Paxil, Effexor, Cymbalta, Lexapro, Depakote, Seroquel, buspar buspar-vertigo Zoloft-no sleep for 3 days Effexor-dizziness and increase in anxiety Cymbalta-dizziness and sedated Wellbutrin worked the best. Substance Abuse History in the last 12 months:  No.  Consequences of Substance  Abuse: NA  Medical Decision Making:  Review of Psycho-Social Stressors (1), Established Problem, Worsening (2) and Review of Medication Regimen & Side Effects (2)  Treatment Plan Summary:  Medication management  Depression:  Wellbutrin 225mg  XL for depression..  Augment with lamictal as SSRI have already been tried in past. Will start 25mg  increase to 50mg  in one week.   Recommend therapy She is to follow-up with pain management for her pain and any change in meds needed.  No clear manic symptoms in past.  GAD: She is on gabapentin for Pain and fibromyalgia . Gabapentin may help her anxiety and mood stabilization.  Limit use of narcotics for pain and follow closely with pain management clinic and recommendations.  50% time spent coordination of care and counselling, including review of medications /side effects and sleep hygiene.  Patient will go to nearest ED if symptoms increase or worsen. There is no concern for safety at this time. 50% time spent in counseling and coordination of care including patient education.  Time spent: 25 min.  Merian Capron, MD  2:12 PM 12/07/2014

## 2014-12-08 ENCOUNTER — Encounter: Payer: Self-pay | Admitting: Obstetrics & Gynecology

## 2014-12-08 ENCOUNTER — Ambulatory Visit (INDEPENDENT_AMBULATORY_CARE_PROVIDER_SITE_OTHER): Payer: BLUE CROSS/BLUE SHIELD | Admitting: Obstetrics & Gynecology

## 2014-12-08 VITALS — BP 112/78 | HR 72 | Resp 16 | Ht 64.0 in | Wt 213.0 lb

## 2014-12-08 DIAGNOSIS — Z30430 Encounter for insertion of intrauterine contraceptive device: Secondary | ICD-10-CM

## 2014-12-08 DIAGNOSIS — Z01812 Encounter for preprocedural laboratory examination: Secondary | ICD-10-CM

## 2014-12-08 DIAGNOSIS — R102 Pelvic and perineal pain: Secondary | ICD-10-CM

## 2014-12-08 DIAGNOSIS — N921 Excessive and frequent menstruation with irregular cycle: Secondary | ICD-10-CM

## 2014-12-08 DIAGNOSIS — R350 Frequency of micturition: Secondary | ICD-10-CM

## 2014-12-08 LAB — POCT URINE PREGNANCY: PREG TEST UR: NEGATIVE

## 2014-12-08 LAB — CBC
HEMATOCRIT: 43.2 % (ref 36.0–46.0)
Hemoglobin: 13.9 g/dL (ref 12.0–15.0)
MCH: 27.6 pg (ref 26.0–34.0)
MCHC: 32.2 g/dL (ref 30.0–36.0)
MCV: 85.7 fL (ref 78.0–100.0)
MPV: 10.5 fL (ref 8.6–12.4)
Platelets: 305 10*3/uL (ref 150–400)
RBC: 5.04 MIL/uL (ref 3.87–5.11)
RDW: 13.6 % (ref 11.5–15.5)
WBC: 10.1 10*3/uL (ref 4.0–10.5)

## 2014-12-08 MED ORDER — LEVONORGESTREL 20 MCG/24HR IU IUD
INTRAUTERINE_SYSTEM | Freq: Once | INTRAUTERINE | Status: AC
  Administered 2014-12-08: 11:00:00 via INTRAUTERINE

## 2014-12-08 NOTE — Progress Notes (Signed)
   Subjective:    Patient ID: Michelle Mueller, female    DOB: [birth year], 49 y.o.   MRN: [id number]  HPI  48 yo female with menorrhagia and pelvic pain.  Pt had a Mirena in the past and had it removed to see if that helped with back pain.  It did not relieve the pain.  However, she began to have menorrhagia.  Endometrial biopsy showed a polyp but saline sono did not show an irregular cavity.  Pt also having vaginal pain, urinary frequency with low voided volume.  Pt has been told she might have UC in the past.  Pt takes pain meds for chronic pain.  This is not helping with pelvic pain.  Review of Systems  Constitutional: Negative.   Respiratory: Negative.   Cardiovascular: Negative.   Genitourinary: Positive for frequency, vaginal bleeding, vaginal pain, menstrual problem and pelvic pain.  Musculoskeletal: Positive for back pain.  Psychiatric/Behavioral: Negative.        Objective:   Physical Exam  Constitutional: She is oriented to person, place, and time. She appears well-developed and well-nourished. No distress.  HENT:  Head: Normocephalic and atraumatic.  Genitourinary:  Vulvar exam negative for vulvodynia + pain over ischial spine Uterus anteverted Adnexa no mass, non tender   Neurological: She is alert and oriented to person, place, and time.  Skin: Skin is warm and dry.  Psychiatric: She has a normal mood and affect.  Vitals reviewed.  Filed Vitals:   12/08/14 1029  BP: 112/78  Pulse: 72  Resp: 16  Height: 5\' 4"  (1.626 m)  Weight: 213 lb (96.616 kg)           Assessment & Plan:  49 yo female with pelvic pain, menorrhagia, and probalby IC  1-Pelvic PT 2-IUD for cycle control 3-Urology referral for IC  IUD Procedure Note Patient identified, informed consent performed.  Discussed risks of irregular bleeding, cramping, infection, malpositioning or misplacement of the IUD outside the uterus which may require further procedures. Time out was performed.  Urine  pregnancy test negative.  Speculum placed in the vagina.  Cervix visualized.  Cleaned with Betadine x 2.  Grasped anteriorly with a single tooth tenaculum.  Uterus sounded to 9 cm.  Mirena IUD placed per manufacturer's recommendations.  Strings trimmed to 3 cm. Tenaculum was removed, good hemostasis noted.  Patient tolerated procedure well.   Patient was given post-procedure instructions and the Mirena care card with expiration date.  Patient was also asked to check IUD strings periodically and follow up in 4-6 weeks for IUD check.

## 2014-12-09 ENCOUNTER — Telehealth: Payer: Self-pay | Admitting: *Deleted

## 2014-12-09 LAB — URINE CULTURE
Colony Count: NO GROWTH
ORGANISM ID, BACTERIA: NO GROWTH

## 2014-12-09 NOTE — Telephone Encounter (Signed)
-----   Message from Guss Bunde, MD sent at 12/09/2014  4:27 AM EST ----- Call the patient and let them know their lab results are normal.  Thanks!!

## 2014-12-09 NOTE — Telephone Encounter (Signed)
Called pt to adv labs are normal. LMOM for pt to call back if needed

## 2014-12-21 ENCOUNTER — Ambulatory Visit (INDEPENDENT_AMBULATORY_CARE_PROVIDER_SITE_OTHER): Payer: BLUE CROSS/BLUE SHIELD | Admitting: Internal Medicine

## 2014-12-21 ENCOUNTER — Ambulatory Visit (INDEPENDENT_AMBULATORY_CARE_PROVIDER_SITE_OTHER)
Admission: RE | Admit: 2014-12-21 | Discharge: 2014-12-21 | Disposition: A | Discharge status: Home / Self Care | Payer: BLUE CROSS/BLUE SHIELD | Source: Ambulatory Visit | Attending: Internal Medicine | Admitting: Internal Medicine

## 2014-12-21 ENCOUNTER — Encounter: Payer: Self-pay | Admitting: Internal Medicine

## 2014-12-21 VITALS — BP 138/88 | HR 98 | Ht 65.0 in | Wt 213.8 lb

## 2014-12-21 DIAGNOSIS — R0789 Other chest pain: Secondary | ICD-10-CM

## 2014-12-21 DIAGNOSIS — I10 Essential (primary) hypertension: Secondary | ICD-10-CM | POA: Insufficient documentation

## 2014-12-21 DIAGNOSIS — R079 Chest pain, unspecified: Secondary | ICD-10-CM

## 2014-12-21 NOTE — Progress Notes (Signed)
Subjective:     Patient ID: Michelle Mueller, female   DOB: [birth year],    MRN: [id number]  HPI   33 yowf never smoker with ? RA on plaquenil and unexplained fatigue and leg  swelling referred by Dr Etter Sjogren to pulmonary clinic for ? OSA    12/21/2014 1st [facility] office visit/ Michelle Mueller   Chief Complaint  Patient presents with  . Pulmonary Consult    Referred by Dr. Etter Sjogren for eval of Insomnia. Pt states that she is here b/c she had swelling in her ankles approx 2 months ago and was advised by Rheum to see pulmonary to r/o lung issues. Pt c/o CP that comes and goes for the past few yrs- "it comes from my fibro".   onset of arthritis around 2 years prior to OV  With dx of ? Mold allergy/asthma x 2005-2009 requiring inhalers then but not now cc variably intense migratory fleeting bilateral  cp more positional than pleuritic x 2 y corresponding to dx of RA vs fibromyalgnia s cough or significant limiting sob  No obvious patterns in  day to day or daytime variability or assoc  chest tightness, subjective wheeze or overt sinus or hb symptoms. No unusual exp hx or h/o childhood pna/ asthma or knowledge of premature birth.  Sleeping ok without nocturnal  or early am exacerbation  of respiratory  c/o's or need for noct saba. Also denies any obvious fluctuation of symptoms with weather or environmental changes or other aggravating or alleviating factors except as outlined above   In terms of sleep issues she denies hypersomnolence and if fact her main problem is insomnia she relates to pain with prev PSS neg at wt 223 04/19/13   Current Medications, Allergies, Complete Past Medical History, Past Surgical History, Family History, and Social History were reviewed in [employer] record.  ROS  The following are not active complaints unless bolded sore throat, dysphagia, dental problems, itching, sneezing,  nasal congestion or excess/ purulent secretions, ear ache,   fever, chills,  sweats, unintended wt loss, classically pleuritic or exertional cp, hemoptysis,  orthopnea pnd or leg swelling, presyncope, palpitations, abdominal pain, anorexia, nausea, vomiting, diarrhea  or change in bowel or bladder habits, change in stools or urine, dysuria,hematuria,  rash, arthralgias, visual complaints, headache, numbness, weakness or ataxia or problems with walking or coordination,  change in mood/affect or memory.      Review of Systems     Objective:   Physical Exam   pleasant amb obese wf who failed to answer a single question asked in a straightforward manner, tending to go off on tangents or answer questions with ambiguous medical terms or diagnoses    Wt Readings from Last 3 Encounters:  12/21/14 213 lb 12.8 oz (96.979 kg)  12/08/14 213 lb (96.616 kg)  11/11/14 213 lb (96.616 kg)    Vital signs reviewed  HEENT: nl dentition, turbinates, and oropharynx. Nl external ear canals without cough reflex   NECK :  without JVD/Nodes/TM/ nl carotid upstrokes bilaterally   LUNGS: no acc muscle use, clear to A and P bilaterally without cough on insp or exp maneuvers   CV:  RRR  no s3 or murmur or increase in P2, no edema   ABD:  soft and nontender with nl excursion in the supine position. No bruits or organomegaly, bowel sounds nl  MS:  warm without deformities, calf tenderness, cyanosis or clubbing  SKIN: warm and dry without lesions    NEURO:  alert, approp, no deficits    CXR PA and Lateral:   12/21/2014 :    I personally reviewed images and agree with radiology impression as follows:    No active cardiopulmonary disease.

## 2014-12-21 NOTE — Assessment & Plan Note (Signed)
Most likely this is fibromyalgia, no evidence of pleural involvement with RA (which usually causes a painless pleuritis) or any other chest syndrome assoc with RA or meds for it.   Pulmonary f/u can be prn

## 2014-12-21 NOTE — Assessment & Plan Note (Signed)
Adequate control on present rx, reviewed > no change in rx needed  = ok on ACEi  However, NB : In the best review of chronic cough to date ( NEJM 2016 375 [id number]) ,  ACEi are now felt to cause cough in up to  20% of pts which is a 4 fold increase from previous reports and does not include the variety of non-specific complaints we see in pulmonary clinic in pts on ACEi but previously attributed to copd/asthma to include PNDS, throat and chest congestion, "bronchitis", unexplained dyspnea and noct "strangling" sensations as well as atypical /refractory GERD symptoms like atypical dysphagia.   I believe she has a very hard time expressing symptoms which are extremely non-specific at baseline so should she develop one of the ACEi symptoms above keep in mind:   The only way to prove this is not an "ACEi Case" is a trial off ACEi x a minimum of 6 weeks then regroup.

## 2014-12-21 NOTE — Patient Instructions (Addendum)
Please remember to go to the x-ray department downstairs for your tests - we will call you with the results when they are available.     Pulmonary follow up is as needed  

## 2014-12-31 ENCOUNTER — Encounter: Payer: Self-pay | Admitting: Family Medicine

## 2015-01-04 MED ORDER — LISINOPRIL 5 MG PO TABS: 5.0000 mg | ORAL_TABLET | Freq: Every day | ORAL | Status: DC

## 2015-01-04 NOTE — Telephone Encounter (Signed)
Medication filled to pharmacy as requested.   

## 2015-01-07 ENCOUNTER — Encounter (HOSPITAL_COMMUNITY): Payer: Self-pay | Admitting: Psychiatry

## 2015-01-07 ENCOUNTER — Ambulatory Visit (INDEPENDENT_AMBULATORY_CARE_PROVIDER_SITE_OTHER): Payer: BLUE CROSS/BLUE SHIELD | Admitting: Psychiatry

## 2015-01-07 VITALS — BP 124/66 | HR 94 | Ht 65.0 in | Wt 215.0 lb

## 2015-01-07 DIAGNOSIS — G8929 Other chronic pain: Secondary | ICD-10-CM

## 2015-01-07 DIAGNOSIS — F331 Major depressive disorder, recurrent, moderate: Secondary | ICD-10-CM | POA: Diagnosis not present

## 2015-01-07 DIAGNOSIS — F411 Generalized anxiety disorder: Secondary | ICD-10-CM

## 2015-01-07 DIAGNOSIS — M549 Dorsalgia, unspecified: Secondary | ICD-10-CM | POA: Diagnosis not present

## 2015-01-07 MED ORDER — LAMOTRIGINE 25 MG PO TABS: 25.0000 mg | ORAL_TABLET | Freq: Every day | ORAL | Status: DC

## 2015-01-07 NOTE — Progress Notes (Signed)
Patient ID: Michelle Mueller, female   DOB: 1965-11-06, 49 y.o.   MRN: [id number]  Psychiatric Outpatient Follow up Visit  Patient Identification: Michelle Mueller MRN:  [id number] Date of Evaluation:  01/07/2015 Referral Source: [name] Chief Complaint:   Chief Complaint    Follow-up     Visit Diagnosis: MDD. GAD . Mood related to Chronic back pain Diagnosis:   Patient Active Problem List   Diagnosis Date Noted  . Chest pain of uncertain etiology 123456 12/21/2014  . Essential hypertension [I10] 12/21/2014  . Myalgia [M79.1] 11/10/2014  . Insomnia [G47.00] 11/10/2014  . UTI (urinary tract infection) [N39.0] 08/04/2013  . Fibromyalgia muscle pain [M79.7] 06/13/2013  . Severe depression [F32.9] 06/13/2013  . Diabetes mellitus, type II ([place]) [E11.9] 06/13/2013  . Rheumatoid arthritis ([place]) [M06.9] 03/19/2013  . Fibromyalgia [M79.7] 03/19/2013  . Unspecified hypothyroidism [E03.9] 03/19/2013  . Goiter [E04.9] 03/19/2013  . Obesity (BMI 30-39.9) [E66.9] 03/19/2013  . 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 Family history of pancreatic cancer [Z80.0] 01/18/2011  . Chronic diarrhea [K52.9] 01/18/2011  . ACUTE PHARYNGITIS [J02.9] 10/24/2010   History of Present Illness:   Patient returns for follow up visit. Initially seen by [name] with the following " She initially presented as having  been off of psychiatric medications for approximately 7-8 years.  She had recurrence of depression and feeling of down. Pain exacerbated her depression and tiredness.  Mood disorder; she was started on Lamictal 25 mg increasing to 50). There is some improvement in her irritability. Depression; she is on Wellbutrin. She has noticed that adding Lamictal has helped  Still concerned about her pain has made appointment with [facility] but that is in February. She is on multiple medications but her focus remains pain which affects her mood She has some  mood symptoms which are also  related with her physical ailment including back condition. She feels that she is limited in her activities and that makes her upset and down including anxiety. Fibromyalgia adds more to her mood.  States her parents or support system leave her when she is having an exacerbation of arthritis and fibromyalgia and she only has one support that is her kids Aggravating factor; her back pain. Sleep issues. Finances. Severity of depression. 6/10. 10 being no depression  anxiety : 5/10 . 10 being extreme anxiety Medical complexity: back pain and fibromyalgia effects her sleep and depression.  (Hypo) Manic Symptoms:  none Anxiety Symptoms:  Excessive Worry, Psychotic Symptoms:  none PTSD Symptoms: Negative  Past Medical History:  Past Medical History  Diagnosis Date  . Ovarian cyst   . Diabetes mellitus without complication ([place])   . Hypothyroidism   . PONV (postoperative nausea and vomiting)   . Asthma     last attack 3 yrs ago  . Hypertension     [name]  . H/O hiatal hernia   . Goiter   . Arthritis   . Chicken pox   . Anxiety   . Depression   . Fibromyalgia     Past Surgical History  Procedure Laterality Date  . Cholecystectomy    . Cesarean section    . Tonsillectomy    . Hernia repair    . Lipoma excision      L breast  . Tumor excision      Nerve sheath tumor, R arm  . Anterior lumbar fusion  12/18/2011    Procedure: ANTERIOR LUMBAR FUSION 1 LEVEL;  Surgeon: [name], MD;  Location: [facility];  Service: Neurosurgery;  Laterality: N/A;  Lumbar four-five Anterior Lumbar Interbody Fusion with Dr. [name] to do anterior exposure  . Abdominal exposure  12/18/2011    Procedure: ABDOMINAL EXPOSURE;  Surgeon: [name], MD;  Location: [facility];  Service: Vascular;  Laterality: N/A;  Anterior Expossure for anterior lumbar interbody fuson   Family History:  Family History  Problem Relation Age of Onset  . Heart disease Father   . Lung cancer Father   . Colon  cancer Maternal Aunt   . Pancreatic cancer Maternal Grandmother   . Pancreatic cancer Maternal Grandfather   . Pancreatic cancer Paternal Grandmother   . Cancer Paternal Grandfather    Social History:   Social History   Social History  . Marital Status: Divorced    Spouse Name: N/A  . Number of Children: N/A  . Years of Education: N/A   Social History Main Topics  . Smoking status: Never Smoker   . Smokeless tobacco: Never Used  . Alcohol Use: No  . Drug Use: No  . Sexual Activity: No   Other Topics Concern  . 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  She lives in [place] with her 65 year old son.  She has an older son Michelle Mueller who is 22. She is very close to her sons, but not her birth family.  She is on disability due to multiple medical problems for the past 3 years.  She was initially treated for depression at age 36 upon her first hospitalization for a suicide attempt by cutting her wrists. Her second hospitalization was in '92/'93 at [facility] in [place] at age 26 for suicidal ideation with husband's gun in her hand. She has had 3 other admissions as well with a third suicide attempt from an intentional overdose of Tegratol in '97/'98, in [place]. Her last admission was in [place] at [facility] in 2005, for depression with an accidental overdose of Ambien.     Review of Systems  Constitutional: Negative for fever.  Cardiovascular: Negative for chest pain.  Gastrointestinal: Negative for nausea.  Musculoskeletal: Positive for back pain.  Neurological: Negative for tremors.  Psychiatric/Behavioral: Positive for depression.     Blood pressure 124/66, pulse 94, height 5\' 5"  (1.651 m), weight 215 lb (97.523 kg), last menstrual period 12/21/2014, SpO2 96 %.Body mass index is 35.78 kg/(m^2).  General Appearance: Well Groomed  Eye Contact:  Good  Speech:  Clear and Coherent  Volume:  Normal  Mood:  Somewhat anxious. Less dysphoric  Affect:  constricted  Thought Process:  Coherent, Goal Directed, Intact, Linear and Logical  Orientation:  Full (Time, Place, and Person)  Thought Content:  WDL  Suicidal Thoughts:  No  Homicidal Thoughts:  No  Memory:  Immediate;   Good Recent;   Good Remote;   Good  Judgement:  Good  Insight:  Present  Psychomotor Activity:  Decreased  Concentration:  Fair  Recall:  Good  Fund of Knowledge:Good  Language: Good  Akathisia:  No  Handed:  Left  AIMS (if indicated):    Assets:  Communication Skills Desire for Improvement Financial Resources/Insurance Housing Resilience Transportation  ADL's:  Intact  Cognition: WNL  Sleep:  poor   Is the patient at risk to self?  No. Has the patient been a risk to self in the past 6 months?  No. Has the patient been a risk to self within the distant past?  Yes.   Is the patient a risk to others?  No.  Allergies:   Allergies  Allergen Reactions  . Erythromycin Other (See Comments)    arrhythmia  . Zocor  [Simvastatin-High Dose] Rash  . Doxycycline Other (See Comments)    Decreased BP and caused dizziness per patient   Current Medications: Current Outpatient Prescriptions  Medication Sig Dispense Refill  . atorvastatin (LIPITOR) 10 MG tablet Take 1 tablet (10 mg total) by mouth daily. 90 tablet 1  . buPROPion (WELLBUTRIN XL) 150 MG 24 hr tablet Take one and one half a day. 135 tablet 0  . Calcium-Magnesium-Vitamin D (CALCIUM MAGNESIUM PO) Take 1 tablet by mouth daily.    . cetirizine (ZYRTEC) 5 MG tablet Take 5 mg by mouth daily.    . Cholecalciferol (VITAMIN D) 2000 UNITS CAPS Take 1 capsule by mouth daily.    . cyclobenzaprine  (FLEXERIL) 5 MG tablet   1  . gabapentin (NEURONTIN) 300 MG capsule Take 3 capsules by mouth at bedtime.     [name] glucose blood (ONE TOUCH ULTRA TEST) test strip Test blood sugar once daily. Dx code: 250.00 100 each 12  . hydroxychloroquine (PLAQUENIL) 200 MG tablet Take 1.5 tablets by mouth daily.  2  . lamoTRIgine (LAMICTAL) 25 MG tablet Take 1 tablet (25 mg total) by mouth daily. Take 2 tablets a day. 60 tablet 0  . levonorgestrel (MIRENA) 20 MCG/24HR IUD 1 each by Intrauterine route once.    [name] levothyroxine (SYNTHROID, LEVOTHROID) 50 MCG tablet Take 1 tablet by mouth daily.  11  . lisinopril (PRINIVIL,ZESTRIL) 5 MG tablet Take 1 tablet (5 mg total) by mouth daily. 90 tablet 1  . metFORMIN (GLUCOPHAGE-XR) 500 MG 24 hr tablet Take 500 mg by mouth every evening.     . Multiple Vitamin (MULTIVITAMIN WITH MINERALS) TABS Take 1 tablet by mouth daily.    . naproxen sodium (ANAPROX) 220 MG tablet Take 220 mg by mouth 2 (two) times daily with a meal.    . ONETOUCH DELICA LANCETS 99991111 MISC Test blood sugar once daily. Dx code: 250.00 100 each 12  . OVER THE COUNTER MEDICATION Vitamin B-12 3000 mcg 1 table daily.    [name] oxyCODONE 10 MG TABS Take 1 tablet (10 mg total) by mouth every 6 (six) hours as needed (for pain). 60 tablet 0  . Phenylephrine-Acetaminophen (TYLENOL SINUS CONGESTION/PAIN PO) Take 2 tablets by mouth daily as needed (for sinus congestion).      No current facility-administered medications for this visit.    Previous Psychotropic Medications: Yes  Prozac, Zoloft, Tegratol, Wellbutrin, Paxil, Effexor, Cymbalta, Lexapro, Depakote, Seroquel, buspar buspar-vertigo Zoloft-no sleep for 3 days Effexor-dizziness and increase in anxiety Cymbalta-dizziness and sedated Wellbutrin worked the best. Substance Abuse History in the last 12 months:  No.  Consequences of Substance Abuse: NA  Medical Decision Making:  Review of Psycho-Social Stressors (1), Established Problem, Worsening (2) and  Review of Medication Regimen & Side Effects (2)  Treatment Plan Summary: Medication management  Depression:  Wellbutrin 225mg  XL for depression.. She can call when refill ready.  lamictal  to 50mg  a day. Prescription sent. It has helped as augmentor. Recommend therapy She is to follow-up with pain management  for her pain and any change in meds needed.  No clear manic symptoms in past.  GAD: She is on gabapentin for Pain and fibromyalgia . Gabapentin may help her anxiety and mood stabilization.  Limit use of narcotics for pain and follow closely with pain management clinic and recommendations.  50% time spent coordination of care and counselling, including review of medications /side effects and sleep hygiene.  Patient will go to nearest ED if symptoms increase or worsen. There is no concern for safety at this time. 50% time spent in counseling and coordination of care including patient education.  Time spent: 25 min.  Merian Capron, MD  10:43 AM 01/07/2015

## 2015-01-11 ENCOUNTER — Telehealth (HOSPITAL_COMMUNITY): Payer: Self-pay | Admitting: *Deleted

## 2015-01-11 NOTE — Telephone Encounter (Signed)
Pt brought in forms for Disability to be completed by 01/13/15. Please call once paperwork is complete at [phone].

## 2015-01-12 NOTE — Telephone Encounter (Signed)
LVM for pt to return call to office.

## 2015-01-12 NOTE — Telephone Encounter (Signed)
done

## 2015-01-15 NOTE — Telephone Encounter (Signed)
Received telephone call from [employer] to check the status of disability paperwork for pt. Per Mrs. Hearing, please fax doctor's notes to [phone].  Documents have been faxed on 01/13/15.

## 2015-01-18 ENCOUNTER — Telehealth: Payer: Self-pay | Admitting: Family Medicine

## 2015-01-18 DIAGNOSIS — Z202 Contact with and (suspected) exposure to infections with a predominantly sexual mode of transmission: Secondary | ICD-10-CM

## 2015-01-18 DIAGNOSIS — Z206 Contact with and (suspected) exposure to human immunodeficiency virus [HIV]: Secondary | ICD-10-CM

## 2015-01-18 NOTE — Telephone Encounter (Signed)
Scheduled for 01/27/15 at pt request exactly 3 months from last interaction

## 2015-01-18 NOTE — Telephone Encounter (Signed)
She was exposed, her Ex-boyfriend was diagnosed and she came in for testing, you told her to come back but she cannot remember how soon. Please advise     KP

## 2015-01-18 NOTE — Telephone Encounter (Signed)
Pt would like call for protocol/stipulations for testing for HIV. She believes it is 3 months after interaction. She states is has now been 3 months and when she was tested in October it had not been. Please call her to confirm as she is wanting retested and needs to know appropriate time frame.

## 2015-01-18 NOTE — Telephone Encounter (Signed)
3 months is correct.  

## 2015-01-18 NOTE — Telephone Encounter (Signed)
The order is in, please scheduled the patient in Jan.    KP

## 2015-01-18 NOTE — Telephone Encounter (Signed)
Annually unless exposed to it

## 2015-01-18 NOTE — Telephone Encounter (Signed)
Please advise      KP 

## 2015-01-27 ENCOUNTER — Other Ambulatory Visit (INDEPENDENT_AMBULATORY_CARE_PROVIDER_SITE_OTHER): Payer: BLUE CROSS/BLUE SHIELD

## 2015-01-27 DIAGNOSIS — Z206 Contact with and (suspected) exposure to human immunodeficiency virus [HIV]: Secondary | ICD-10-CM | POA: Diagnosis not present

## 2015-01-27 DIAGNOSIS — Z202 Contact with and (suspected) exposure to infections with a predominantly sexual mode of transmission: Secondary | ICD-10-CM

## 2015-01-28 ENCOUNTER — Telehealth: Payer: Self-pay | Admitting: Family Medicine

## 2015-01-28 ENCOUNTER — Telehealth (HOSPITAL_COMMUNITY): Payer: Self-pay | Admitting: *Deleted

## 2015-01-28 LAB — HIV ANTIBODY (ROUTINE TESTING W REFLEX): HIV: NONREACTIVE

## 2015-01-28 MED ORDER — BUPROPION HCL ER (XL) 150 MG PO TB24: ORAL_TABLET | ORAL | Status: DC

## 2015-01-28 NOTE — Telephone Encounter (Signed)
Pt requesting lab work results from yesterday

## 2015-01-28 NOTE — Telephone Encounter (Signed)
PT called for a refill for Wellbutrin 150mg . Per Dr. De Nurse, pt is authorized for a refill for Wellbutrin 150mg , #135. Rx was sent to pharmacy. Pt has a f/u appt on 02/11/15. Called and informed pt of prescription status. Pt verbalizes understanding.

## 2015-01-28 NOTE — Telephone Encounter (Signed)
Patient aware the HIV test is Negative.     KP

## 2015-02-05 ENCOUNTER — Encounter: Payer: Self-pay | Admitting: Family Medicine

## 2015-02-05 ENCOUNTER — Other Ambulatory Visit: Payer: Self-pay | Admitting: Family Medicine

## 2015-02-05 DIAGNOSIS — E039 Hypothyroidism, unspecified: Secondary | ICD-10-CM

## 2015-02-05 DIAGNOSIS — IMO0001 Reserved for inherently not codable concepts without codable children: Secondary | ICD-10-CM

## 2015-02-05 DIAGNOSIS — E1165 Type 2 diabetes mellitus with hyperglycemia: Secondary | ICD-10-CM

## 2015-02-05 MED ORDER — METFORMIN HCL ER 500 MG PO TB24: 500.0000 mg | ORAL_TABLET | Freq: Every evening | ORAL | Status: DC

## 2015-02-05 MED ORDER — LEVOTHYROXINE SODIUM 50 MCG PO TABS: 50.0000 ug | ORAL_TABLET | Freq: Every day | ORAL | Status: DC

## 2015-02-11 ENCOUNTER — Other Ambulatory Visit: Payer: Self-pay

## 2015-02-11 ENCOUNTER — Encounter (HOSPITAL_COMMUNITY): Payer: Self-pay | Admitting: Psychiatry

## 2015-02-11 ENCOUNTER — Ambulatory Visit (INDEPENDENT_AMBULATORY_CARE_PROVIDER_SITE_OTHER): Payer: BLUE CROSS/BLUE SHIELD | Admitting: Psychiatry

## 2015-02-11 VITALS — BP 124/66 | HR 90 | Ht 65.0 in | Wt 218.0 lb

## 2015-02-11 DIAGNOSIS — IMO0001 Reserved for inherently not codable concepts without codable children: Secondary | ICD-10-CM

## 2015-02-11 DIAGNOSIS — F331 Major depressive disorder, recurrent, moderate: Secondary | ICD-10-CM | POA: Diagnosis not present

## 2015-02-11 DIAGNOSIS — F411 Generalized anxiety disorder: Secondary | ICD-10-CM

## 2015-02-11 DIAGNOSIS — G8929 Other chronic pain: Secondary | ICD-10-CM

## 2015-02-11 DIAGNOSIS — E039 Hypothyroidism, unspecified: Secondary | ICD-10-CM

## 2015-02-11 DIAGNOSIS — M549 Dorsalgia, unspecified: Secondary | ICD-10-CM

## 2015-02-11 DIAGNOSIS — E1165 Type 2 diabetes mellitus with hyperglycemia: Secondary | ICD-10-CM

## 2015-02-11 MED ORDER — LEVOTHYROXINE SODIUM 50 MCG PO TABS: 50.0000 ug | ORAL_TABLET | Freq: Every day | ORAL | Status: DC

## 2015-02-11 MED ORDER — METFORMIN HCL ER 500 MG PO TB24: 500.0000 mg | ORAL_TABLET | Freq: Every evening | ORAL | Status: DC

## 2015-02-11 NOTE — Progress Notes (Signed)
Patient ID: Michelle Mueller, female   DOB: April 12, 1965, 50 y.o.   MRN: [id number]  Psychiatric Outpatient Follow up Visit  Patient Identification: Michelle Mueller MRN:  [id number] Date of Evaluation:  02/11/2015 Referral Source: Dr. Lennox Mueller Chief Complaint:   Chief Complaint    Follow-up     Visit Diagnosis: MDD. GAD . Mood related to Chronic back pain Diagnosis:   Patient Active Problem List   Diagnosis Date Noted  . Chest pain of uncertain etiology 123456 12/21/2014  . Essential hypertension [I10] 12/21/2014  . Myalgia [M79.1] 11/10/2014  . Insomnia [G47.00] 11/10/2014  . UTI (urinary tract infection) [N39.0] 08/04/2013  . Fibromyalgia muscle pain [M79.7] 06/13/2013  . Severe depression [F32.9] 06/13/2013  . Diabetes mellitus, type II ([place]) [E11.9] 06/13/2013  . Rheumatoid arthritis ([place]) [M06.9] 03/19/2013  . Fibromyalgia [M79.7] 03/19/2013  . Unspecified hypothyroidism [E03.9] 03/19/2013  . Goiter [E04.9] 03/19/2013  . Obesity (BMI 30-39.9) [E66.9] 03/19/2013  . 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 Family history of pancreatic cancer [Z80.0] 01/18/2011  . Chronic diarrhea [K52.9] 01/18/2011  . ACUTE PHARYNGITIS [J02.9] 10/24/2010   History of Present Illness:   Patient returns for follow up visit. Initially seen by Michelle Mueller with the following " She initially presented as having  been off of psychiatric medications for approximately 7-8 years.  She had recurrence of depression and feeling of down. Pain exacerbated her depression and tiredness.  Mood disorder; she was started on Lamictal 25 mg increasing to 50). There is improvement in her mood and irritability.  Her disability has been approved and that is also adding to her improvement. She can does suffer from pain myofascial pain and follows up  Depression; she is on Wellbutrin. She has noticed that adding Lamictal has helped  Still  has made appointment with Michelle Mueller but that is in February. She is on  multiple medications but her focus remains pain which affects her mood She has some  mood symptoms which are also related with her physical ailment including back condition. She feels that she is limited in her activities and that makes her upset and down including anxiety. Fibromyalgia adds more to her mood.  States her parents or support system leave her when she is having an exacerbation of arthritis and fibromyalgia and she only has one support that is her kids Aggravating factor; her back pain. Sleep issues. Finances. Severity of depression. 7/10. 10 being no depression  anxiety : 5/10 . 10 being extreme anxiety Medical complexity: back pain and fibromyalgia effects her sleep and depression.  (Hypo) Manic Symptoms:  none Anxiety Symptoms:  Excessive Worry, Psychotic Symptoms:  none PTSD Symptoms: Negative  Past Medical History:  Past Medical History  Diagnosis Date  . Ovarian cyst   . Diabetes mellitus without complication ([place])   . Hypothyroidism   . PONV (postoperative nausea and vomiting)   . Asthma     last attack 3 yrs ago  . Hypertension     dr Michelle Mueller  . H/O hiatal hernia   . Goiter   . Arthritis   . Chicken pox   . Anxiety   . Depression   . Fibromyalgia     Past Surgical History  Procedure Laterality Date  . Cholecystectomy    . Cesarean section    . Tonsillectomy    . Hernia repair    . Lipoma excision      L breast  . Tumor excision      Nerve sheath  tumor, R arm  . Anterior lumbar fusion  12/18/2011    Procedure: ANTERIOR LUMBAR FUSION 1 LEVEL;  Surgeon: Michelle Miss, MD;  Location: [facility];  Service: Neurosurgery;  Laterality: N/A;  Lumbar four-five Anterior Lumbar Interbody Fusion with Dr. Donnetta Mueller to do anterior exposure  . Abdominal exposure  12/18/2011    Procedure: ABDOMINAL EXPOSURE;  Surgeon: Michelle Posner, MD;  Location: [facility];  Service: Vascular;  Laterality: N/A;  Anterior Expossure for anterior lumbar interbody fuson   Family History:   Family History  Problem Relation Age of Onset  . Heart disease Father   . Lung cancer Father   . Colon cancer Maternal Aunt   . Pancreatic cancer Maternal Grandmother   . Pancreatic cancer Maternal Grandfather   . Pancreatic cancer Paternal Grandmother   . Cancer Paternal Grandfather    Social History:   Social History   Social History  . Marital Status: Divorced    Spouse Name: N/A  . Number of Children: N/A  . Years of Education: N/A   Social History Main Topics  . Smoking status: Never Smoker   . Smokeless tobacco: Never Used  . Alcohol Use: No  . Drug Use: No  . Sexual Activity: No   Other Topics Concern  . None   Social History Narrative   Additional Social History: 2 semesters of college, former Web designer  Musculoskeletal: Strength & Muscle Tone: within normal limits Gait & Station: normal Patient leans: N/A  Psychiatric Specialty Exam: HPI Comments: Michelle Mueller is a 50 year old DWF who is referred by her PCP for treatment of long standing depression.  She lives in [place] with her 30 year old son.  She has an older son Michelle Mueller who is 60. She is very close to her sons, but not her birth family.  She is on disability due to multiple medical problems for the past 3 years.  She was initially treated for depression at age 28 upon her first hospitalization for a suicide attempt by cutting her wrists. Her second hospitalization was in '92/'93 at [facility] in [place] at age 32 for suicidal ideation with husband's gun in her hand. She has had 3 other admissions as well with a third suicide attempt from an intentional overdose of Tegratol in '97/'98, in [place]. Her last admission was in [place] at [facility] in 2005, for depression with an accidental overdose of Ambien.     Review of Systems  Constitutional: Negative for fever.  Cardiovascular: Negative for chest pain.  Gastrointestinal: Negative for nausea.  Musculoskeletal:  Positive for back pain.  Neurological: Negative for tremors.  Psychiatric/Behavioral: Negative for suicidal ideas and substance abuse.    Blood pressure 124/66, pulse 90, height 5\' 5"  (1.651 m), weight 218 lb (98.884 kg), SpO2 95 %.Body mass index is 36.28 kg/(m^2).  General Appearance: Well Groomed  Eye Contact:  Good  Speech:  Clear and Coherent  Volume:  Normal  Mood:  Less dysthymic  Affect:  constricted  Thought Process:  Coherent, Goal Directed, Intact, Linear and Logical  Orientation:  Full (Time, Place, and Person)  Thought Content:  WDL  Suicidal Thoughts:  No  Homicidal Thoughts:  No  Memory:  Immediate;   Good Recent;   Good Remote;   Good  Judgement:  Good  Insight:  Present  Psychomotor Activity:  Decreased  Concentration:  Fair  Recall:  Good  Fund of Knowledge:Good  Language: Good  Akathisia:  No  Handed:  Left  AIMS (if indicated):    Assets:  Communication Skills Desire for Improvement Financial Resources/Insurance Housing Resilience Transportation  ADL's:  Intact  Cognition: WNL  Sleep:  poor   Is the patient at risk to self?  No. Has the patient been a risk to self in the past 6 months?  No. Has the patient been a risk to self within the distant past?  Yes.   Is the patient a risk to others?  No.  Allergies:   Allergies  Allergen Reactions  . Erythromycin Other (See Comments)    arrhythmia  . Zocor  [Simvastatin-High Dose] Rash  . Doxycycline Other (See Comments)    Decreased BP and caused dizziness per patient   Current Medications: Current Outpatient Prescriptions  Medication Sig Dispense Refill  . atorvastatin (LIPITOR) 10 MG tablet Take 1 tablet (10 mg total) by mouth daily. 90 tablet 1  . buPROPion (WELLBUTRIN XL) 150 MG 24 hr tablet Take one and one half a day. 135 tablet 0  . Calcium-Magnesium-Vitamin D (CALCIUM MAGNESIUM PO) Take 1 tablet by mouth daily.    . cetirizine (ZYRTEC) 5 MG tablet Take 5 mg by mouth daily.    .  Cholecalciferol (VITAMIN D) 2000 UNITS CAPS Take 1 capsule by mouth daily.    . cyclobenzaprine (FLEXERIL) 5 MG tablet   1  . gabapentin (NEURONTIN) 300 MG capsule Take 3 capsules by mouth at bedtime.     [name] glucose blood (ONE TOUCH ULTRA TEST) test strip Test blood sugar once daily. Dx code: 250.00 100 each 12  . hydroxychloroquine (PLAQUENIL) 200 MG tablet Take 1.5 tablets by mouth daily.  2  . lamoTRIgine (LAMICTAL) 25 MG tablet Take 1 tablet (25 mg total) by mouth daily. Take 2 tablets a day. 60 tablet 0  . levonorgestrel (MIRENA) 20 MCG/24HR IUD 1 each by Intrauterine route once.    [name] levothyroxine (SYNTHROID, LEVOTHROID) 50 MCG tablet Take 1 tablet (50 mcg total) by mouth daily. 90 tablet 1  . lisinopril (PRINIVIL,ZESTRIL) 5 MG tablet Take 1 tablet (5 mg total) by mouth daily. 90 tablet 1  . metFORMIN (GLUCOPHAGE-XR) 500 MG 24 hr tablet Take 1 tablet (500 mg total) by mouth every evening. 90 tablet 1  . Multiple Vitamin (MULTIVITAMIN WITH MINERALS) TABS Take 1 tablet by mouth daily.    . naproxen sodium (ANAPROX) 220 MG tablet Take 220 mg by mouth 2 (two) times daily with a meal.    . ONETOUCH DELICA LANCETS 99991111 MISC Test blood sugar once daily. Dx code: 250.00 100 each 12  . OVER THE COUNTER MEDICATION Vitamin B-12 3000 mcg 1 table daily.    [name] oxyCODONE 10 MG TABS Take 1 tablet (10 mg total) by mouth every 6 (six) hours as needed (for pain). 60 tablet 0  . Phenylephrine-Acetaminophen (TYLENOL SINUS CONGESTION/PAIN PO) Take 2 tablets by mouth daily as needed (for sinus congestion).      No current facility-administered medications for this visit.    Previous Psychotropic Medications: Yes  Prozac, Zoloft, Tegratol, Wellbutrin, Paxil, Effexor, Cymbalta, Lexapro, Depakote, Seroquel, buspar buspar-vertigo Zoloft-no sleep for 3 days Effexor-dizziness and increase in anxiety Cymbalta-dizziness and sedated Wellbutrin worked the best. Substance Abuse History in the last 12 months:   No.  Consequences of Substance Abuse: NA  Medical Decision Making:  Review of Psycho-Social Stressors (1), Established Problem, Worsening (2) and Review of Medication Regimen & Side Effects (2)  Treatment Plan Summary: Medication management  Depression:  Wellbutrin 225mg  XL for  depression.. She can call when refill ready.  lamictal  to 50mg  a day. Prescription  Recently sent. It has helped as augmentor. Will continue . No rash. Recommend therapy She is to follow-up with pain management for her pain and any change in meds needed.  No clear manic symptoms in past.  GAD: She is on gabapentin for Pain and fibromyalgia . Gabapentin may help her anxiety and mood stabilization.  Limit use of narcotics for pain and follow closely with pain management clinic and recommendations.  50% time spent coordination of care and counselling, including review of medications /side effects and sleep hygiene.  Patient will go to nearest ED if symptoms increase or worsen. There is no concern for safety at this time. 50% time spent in counseling and coordination of care including patient education.  Time spent: 25 min.  Merian Capron, MD  10:53 AM 02/11/2015

## 2015-03-02 ENCOUNTER — Telehealth (HOSPITAL_COMMUNITY): Payer: Self-pay | Admitting: *Deleted

## 2015-03-02 MED ORDER — LAMOTRIGINE 25 MG PO TABS: 25.0000 mg | ORAL_TABLET | Freq: Every day | ORAL | Status: DC

## 2015-03-02 NOTE — Telephone Encounter (Signed)
PT call for a refill for Lamictal 25mg . Pt will need a 90 day supply for her insurance to pay for the prescription. Per Dr. De Nurse, pt is authorized for a 90 day supply for Lamictal 25mg , #180. Rx was sent to pharmacy. Pt is schedule for a f/u appt on 05/10/15. Called and informed pt of prescription status. Pt verbalizes understanding.

## 2015-04-26 ENCOUNTER — Telehealth (HOSPITAL_COMMUNITY): Payer: Self-pay | Admitting: Psychiatry

## 2015-04-29 MED ORDER — BUPROPION HCL ER (XL) 150 MG PO TB24: ORAL_TABLET | ORAL | Status: DC

## 2015-04-29 NOTE — Telephone Encounter (Signed)
[facility] We will need this information to call in rx.    [phone] [address] [facility] [address] [place], [place]  Phone [phone]

## 2015-04-29 NOTE — Telephone Encounter (Signed)
Pt called for a refill for Wellbutrin 150mg . Per Dr. De Nurse, medication request is authorized for Wellbutrin 150mg , #135. Rx was sent to [facility]. Pt is schedule for a f/u appt on 05/10/15. Called and informed pt of Rx status. Pt verbalizes understanding.

## 2015-05-10 ENCOUNTER — Ambulatory Visit (HOSPITAL_COMMUNITY): Payer: Self-pay | Admitting: Psychiatry

## 2015-05-17 ENCOUNTER — Encounter: Payer: Self-pay | Admitting: Family Medicine

## 2015-05-19 ENCOUNTER — Other Ambulatory Visit: Payer: Self-pay

## 2015-05-19 ENCOUNTER — Encounter (HOSPITAL_COMMUNITY): Payer: Self-pay | Admitting: Psychiatry

## 2015-05-19 ENCOUNTER — Ambulatory Visit (INDEPENDENT_AMBULATORY_CARE_PROVIDER_SITE_OTHER): Payer: BLUE CROSS/BLUE SHIELD | Admitting: Psychiatry

## 2015-05-19 VITALS — BP 122/76 | HR 94 | Ht 65.0 in | Wt 215.0 lb

## 2015-05-19 DIAGNOSIS — G8929 Other chronic pain: Secondary | ICD-10-CM

## 2015-05-19 DIAGNOSIS — F331 Major depressive disorder, recurrent, moderate: Secondary | ICD-10-CM | POA: Diagnosis not present

## 2015-05-19 DIAGNOSIS — F411 Generalized anxiety disorder: Secondary | ICD-10-CM

## 2015-05-19 DIAGNOSIS — M549 Dorsalgia, unspecified: Secondary | ICD-10-CM | POA: Diagnosis not present

## 2015-05-19 MED ORDER — LAMOTRIGINE 25 MG PO TABS: 25.0000 mg | ORAL_TABLET | Freq: Every day | ORAL | Status: DC

## 2015-05-19 MED ORDER — ATORVASTATIN CALCIUM 10 MG PO TABS: 10.0000 mg | ORAL_TABLET | Freq: Every day | ORAL | Status: DC

## 2015-05-19 NOTE — Progress Notes (Signed)
Patient ID: Michelle Mueller, female   DOB: 10-Mar-1965, 50 y.o.   MRN: [id number]  Psychiatric Outpatient Follow up Visit  Patient Identification: Michelle Mueller MRN:  [id number] Date of Evaluation:  05/19/2015 Referral Source: [name] Chief Complaint:   Chief Complaint    Follow-up     Visit Diagnosis: MDD. GAD . Mood related to Chronic back pain Diagnosis:   Patient Active Problem List   Diagnosis Date Noted  . Chest pain of uncertain etiology 123456 12/21/2014  . Essential hypertension [I10] 12/21/2014  . Myalgia [M79.1] 11/10/2014  . Insomnia [G47.00] 11/10/2014  . UTI (urinary tract infection) [N39.0] 08/04/2013  . Fibromyalgia muscle pain [M79.7] 06/13/2013  . Severe depression [F32.9] 06/13/2013  . Diabetes mellitus, type II ([place]) [E11.9] 06/13/2013  . Rheumatoid arthritis ([place]) [M06.9] 03/19/2013  . Fibromyalgia [M79.7] 03/19/2013  . Unspecified hypothyroidism [E03.9] 03/19/2013  . Goiter [E04.9] 03/19/2013  . Obesity (BMI 30-39.9) [E66.9] 03/19/2013  . 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 Family history of pancreatic cancer [Z80.0] 01/18/2011  . Chronic diarrhea [K52.9] 01/18/2011  . ACUTE PHARYNGITIS [J02.9] 10/24/2010   History of Present Illness:   Patient returns for follow up visit. Initially seen by [name] with the following " She initially presented as having  been off of psychiatric medications for approximately 7-8 years.  She had recurrence of depression and feeling of down. Pain exacerbated her depression and tiredness.  Mood disorder; she was started on Lamictal 25 mg increasing to 50). Mood is balanced. Less irritable Pain effects her mood. Confusion between carpel tunnel or RA . Working with providers if surgery needed. Her disability has been approved and that is also adding to her improvement. She  does suffer from pain myofascial pain and follows up  Depression; she is on Wellbutrin. She has noticed that adding Lamictal  has helped  States her parents or support system leave her when she is having an exacerbation of arthritis and fibromyalgia and she only has one support that is her kids Aggravating factor; her back pain. Sleep issues. Finances. Severity of depression. 7/10. 10 being no depression  anxiety : 5/10 . 10 being extreme anxiety Medical complexity: back pain and fibromyalgia effects her sleep and depression.  (Hypo) Manic Symptoms:  none Anxiety Symptoms:  Excessive Worry,(somewhat improved) Psychotic Symptoms:  none PTSD Symptoms: Negative  Past Medical History:  Past Medical History  Diagnosis Date  . Ovarian cyst   . Diabetes mellitus without complication ([place])   . Hypothyroidism   . PONV (postoperative nausea and vomiting)   . Asthma     last attack 3 yrs ago  . Hypertension     [name]  . H/O hiatal hernia   . Goiter   . Arthritis   . Chicken pox   . Anxiety   . Depression   . Fibromyalgia     Past Surgical History  Procedure Laterality Date  . Cholecystectomy    . Cesarean section    . Tonsillectomy    . Hernia repair    . Lipoma excision      L breast  . Tumor excision      Nerve sheath tumor, R arm  . Anterior lumbar fusion  12/18/2011    Procedure: ANTERIOR LUMBAR FUSION 1 LEVEL;  Surgeon: [name], MD;  Location: [place] [facility];  Service: Neurosurgery;  Laterality: N/A;  Lumbar four-five Anterior Lumbar Interbody Fusion with Dr. [name] to do anterior exposure  . Abdominal exposure  12/18/2011    Procedure: ABDOMINAL EXPOSURE;  Surgeon: [name], MD;  Location: [facility];  Service: Vascular;  Laterality: N/A;  Anterior Expossure for anterior lumbar interbody fuson   Family History:  Family History  Problem Relation Age of Onset  . Heart disease Father   . Lung cancer Father   . Colon cancer Maternal Aunt   . Pancreatic cancer Maternal Grandmother   . Pancreatic cancer Maternal Grandfather   . Pancreatic cancer Paternal Grandmother   . Cancer  Paternal Grandfather    Social History:   Social History   Social History  . Marital Status: Divorced    Spouse Name: N/A  . Number of Children: N/A  . Years of Education: N/A   Social History Main Topics  . Smoking status: Never Smoker   . Smokeless tobacco: Never Used  . Alcohol Use: No  . Drug Use: No  . Sexual Activity: No   Other Topics Concern  . None   Social History Narrative    Musculoskeletal: Strength & Muscle Tone: within normal limits Gait & Station: normal Patient leans: N/A  Psychiatric Specialty Exam: HPI Comments: Michelle Mueller is a 50 year old DWF who is referred by her PCP for treatment of long standing depression.  She lives in [place] with her 16 year old son.  She has an older son Michelle Mueller who is 42. She is very close to her sons, but not her birth family.  She is on disability due to multiple medical problems for the past 3 years.  She was initially treated for depression at age 28 upon her first hospitalization for a suicide attempt by cutting her wrists. Her second hospitalization was in '92/'93 at [facility] in [place] at age 50 for suicidal ideation with husband's gun in her hand. She has had 3 other admissions as well with a third suicide attempt from an intentional overdose of Tegratol in '97/'98, in [place]. Her last admission was in [place] at [facility] in 2005, for depression with an accidental overdose of Ambien.     Review of Systems  Constitutional: Negative for fever.  Cardiovascular: Negative for chest pain.  Gastrointestinal: Negative for nausea.  Musculoskeletal: Positive for back pain.  Neurological: Negative for tremors.  Psychiatric/Behavioral: Negative for suicidal ideas and substance abuse. The patient does not have insomnia.     Blood pressure 122/76, pulse 94, height 5\' 5"  (1.651 m), weight 215 lb (97.523 kg), SpO2 96 %.Body mass index is 35.78 kg/(m^2).  General Appearance: Well Groomed  Eye Contact:  Good   Speech:  Clear and Coherent  Volume:  Normal  Mood:  Less dysthymic  Affect:  constricted  Thought Process:  Coherent, Goal Directed, Intact, Linear and Logical  Orientation:  Full (Time, Place, and Person)  Thought Content:  WDL  Suicidal Thoughts:  No  Homicidal Thoughts:  No  Memory:  Immediate;   Good Recent;   Good Remote;   Good  Judgement:  Good  Insight:  Present  Psychomotor Activity:  Decreased  Concentration:  Fair  Recall:  Good  Fund of Knowledge:Good  Language: Good  Akathisia:  No  Handed:  Left  AIMS (if indicated):    Assets:  Communication Skills Desire for Improvement Financial Resources/Insurance Housing Resilience Transportation  ADL's:  Intact  Cognition: WNL  Sleep:  poor   Is the patient at risk to self?  No. Has the patient been a risk to self in the past 6 months?  No. Has the patient been a risk to self within the distant past?  Yes.   Is the patient a risk to others?  No.  Allergies:   Allergies  Allergen Reactions  . Erythromycin Other (See Comments)    arrhythmia  . Zocor  [Simvastatin-High Dose] Rash  . Doxycycline Other (See Comments)    Decreased BP and caused dizziness per patient   Current Medications: Current Outpatient Prescriptions  Medication Sig Dispense Refill  . atorvastatin (LIPITOR) 10 MG tablet Take 1 tablet (10 mg total) by mouth daily. Repeat labs are due now 90 tablet 0  . buPROPion (WELLBUTRIN XL) 150 MG 24 hr tablet Take one and one half a day. 135 tablet 0  . Calcium-Magnesium-Vitamin D (CALCIUM MAGNESIUM PO) Take 1 tablet by mouth daily.    . cetirizine (ZYRTEC) 5 MG tablet Take 5 mg by mouth daily.    . Cholecalciferol (VITAMIN D) 2000 UNITS CAPS Take 1 capsule by mouth daily.    . cyclobenzaprine (FLEXERIL) 5 MG tablet   1  . gabapentin (NEURONTIN) 300 MG capsule Take 3 capsules by mouth at bedtime.     [name] glucose blood (ONE TOUCH ULTRA TEST) test strip Test blood sugar once daily. Dx code: 250.00 100 each  12  . hydroxychloroquine (PLAQUENIL) 200 MG tablet Take 400 mg by mouth daily.   2  . lamoTRIgine (LAMICTAL) 25 MG tablet Take 1 tablet (25 mg total) by mouth daily. Take 2 tablets a day. 180 tablet 0  . levonorgestrel (MIRENA) 20 MCG/24HR IUD 1 each by Intrauterine route once.    [name] levothyroxine (SYNTHROID, LEVOTHROID) 50 MCG tablet Take 1 tablet (50 mcg total) by mouth daily. 90 tablet 1  . lisinopril (PRINIVIL,ZESTRIL) 5 MG tablet Take 1 tablet (5 mg total) by mouth daily. 90 tablet 1  . metFORMIN (GLUCOPHAGE-XR) 500 MG 24 hr tablet Take 1 tablet (500 mg total) by mouth every evening. 90 tablet 1  . Multiple Vitamin (MULTIVITAMIN WITH MINERALS) TABS Take 1 tablet by mouth daily.    . naproxen sodium (ANAPROX) 220 MG tablet Take 220 mg by mouth 2 (two) times daily with a meal.    . ONETOUCH DELICA LANCETS 99991111 MISC Test blood sugar once daily. Dx code: 250.00 100 each 12  . OVER THE COUNTER MEDICATION Vitamin B-12 3000 mcg 1 table daily.    [name] oxyCODONE 10 MG TABS Take 1 tablet (10 mg total) by mouth every 6 (six) hours as needed (for pain). 60 tablet 0  . Phenylephrine-Acetaminophen (TYLENOL SINUS CONGESTION/PAIN PO) Take 2 tablets by mouth daily as needed (for sinus congestion).      No current facility-administered medications for this visit.     Treatment Plan Summary: Medication management  Depression:  Wellbutrin 225mg  XL for depression.. She can call when refill ready.  lamictal  to 50mg  a day. Prescription sent .  [name] No rash. Recommend therapy if needed She is to follow-up with pain management for her pain and any change in meds needed.  No clear manic symptoms in past.  GAD: She is on gabapentin for Pain and fibromyalgia . Gabapentin may help her anxiety and mood stabilization.  Limit use of narcotics for pain and follow closely with pain management clinic and recommendations.   Patient will go to nearest ED if symptoms increase or worsen. There is no concern for safety at this  time. More then 50% time spent in counseling and coordination of care including patient education.  Time spent: 25  min.  Merian Capron, MD  2:06 PM 05/19/2015

## 2015-05-22 ENCOUNTER — Other Ambulatory Visit: Payer: Self-pay | Admitting: Family Medicine

## 2015-06-11 ENCOUNTER — Encounter: Payer: Self-pay | Admitting: Family Medicine

## 2015-06-11 ENCOUNTER — Ambulatory Visit (INDEPENDENT_AMBULATORY_CARE_PROVIDER_SITE_OTHER): Payer: BLUE CROSS/BLUE SHIELD | Admitting: Family Medicine

## 2015-06-11 VITALS — BP 116/84 | HR 103 | Temp 98.5°F | Ht 65.0 in | Wt 218.0 lb

## 2015-06-11 DIAGNOSIS — E039 Hypothyroidism, unspecified: Secondary | ICD-10-CM

## 2015-06-11 DIAGNOSIS — E119 Type 2 diabetes mellitus without complications: Secondary | ICD-10-CM

## 2015-06-11 DIAGNOSIS — E1165 Type 2 diabetes mellitus with hyperglycemia: Secondary | ICD-10-CM

## 2015-06-11 DIAGNOSIS — I10 Essential (primary) hypertension: Secondary | ICD-10-CM

## 2015-06-11 DIAGNOSIS — IMO0001 Reserved for inherently not codable concepts without codable children: Secondary | ICD-10-CM

## 2015-06-11 DIAGNOSIS — E785 Hyperlipidemia, unspecified: Secondary | ICD-10-CM

## 2015-06-11 MED ORDER — METFORMIN HCL ER 500 MG PO TB24: 500.0000 mg | ORAL_TABLET | Freq: Every evening | ORAL | Status: DC

## 2015-06-11 MED ORDER — LISINOPRIL 5 MG PO TABS: 5.0000 mg | ORAL_TABLET | Freq: Every day | ORAL | Status: DC

## 2015-06-11 MED ORDER — LEVOTHYROXINE SODIUM 50 MCG PO TABS: 50.0000 ug | ORAL_TABLET | Freq: Every day | ORAL | Status: DC

## 2015-06-11 NOTE — Progress Notes (Signed)
Pre visit review using our clinic review tool, if applicable. No additional management support is needed unless otherwise documented below in the visit note. 

## 2015-06-11 NOTE — Progress Notes (Signed)
Patient ID: [name], female    DOB: 1965/12/14  Age: 50 y.o. MRN: [id number]    Subjective:  Subjective HPI Michelle Mueller presents for f/u dm, cholesterol and thyroid.  No complaints.    Review of Systems  Constitutional: Negative for diaphoresis, appetite change, fatigue and unexpected weight change.  Eyes: Negative for pain, redness and visual disturbance.  Respiratory: Negative for cough, chest tightness, shortness of breath and wheezing.   Cardiovascular: Negative for chest pain, palpitations and leg swelling.  Endocrine: Negative for cold intolerance, heat intolerance, polydipsia, polyphagia and polyuria.  Genitourinary: Negative for dysuria, frequency and difficulty urinating.  Neurological: Negative for dizziness, light-headedness, numbness and headaches.    History Past Medical History  Diagnosis Date  . Ovarian cyst   . Diabetes mellitus without complication ([place])   . Hypothyroidism   . PONV (postoperative nausea and vomiting)   . Asthma     last attack 3 yrs ago  . Hypertension     [name]  . H/O hiatal hernia   . Goiter   . Arthritis   . Chicken pox   . Anxiety   . Depression   . Fibromyalgia     She has past surgical history that includes Cholecystectomy; Cesarean section; Tonsillectomy; Hernia repair; Lipoma excision; Tumor excision; Anterior lumbar fusion (12/18/2011); and Abdominal exposure (12/18/2011).   Her family history includes Cancer in her paternal grandfather; Colon cancer in her maternal aunt; Heart disease in her father; Lung cancer in her father; Pancreatic cancer in her maternal grandfather, maternal grandmother, and paternal grandmother.She reports that she has never smoked. She has never used smokeless tobacco. She reports that she does not drink alcohol or use illicit drugs.  Current Outpatient Prescriptions on File Prior to Visit  Medication Sig Dispense Refill  . atorvastatin (LIPITOR) 10 MG tablet Take 1 tablet (10 mg total) by  mouth daily. Repeat labs are due now 90 tablet 0  . buPROPion (WELLBUTRIN XL) 150 MG 24 hr tablet Take one and one half a day. 135 tablet 0  . Calcium-Magnesium-Vitamin D (CALCIUM MAGNESIUM PO) Take 1 tablet by mouth daily.    . cetirizine (ZYRTEC) 5 MG tablet Take 5 mg by mouth daily.    . Cholecalciferol (VITAMIN D) 2000 UNITS CAPS Take 1 capsule by mouth daily.    . cyclobenzaprine (FLEXERIL) 5 MG tablet   1  . gabapentin (NEURONTIN) 300 MG capsule Take 3 capsules by mouth at bedtime.     [name] glucose blood (ONE TOUCH ULTRA TEST) test strip Test blood sugar once daily. Dx code: 250.00 100 each 12  . hydroxychloroquine (PLAQUENIL) 200 MG tablet Take 400 mg by mouth daily.   2  . lamoTRIgine (LAMICTAL) 25 MG tablet Take 1 tablet (25 mg total) by mouth daily. Take 2 tablets a day. 180 tablet 0  . levonorgestrel (MIRENA) 20 MCG/24HR IUD 1 each by Intrauterine route once.    . Multiple Vitamin (MULTIVITAMIN WITH MINERALS) TABS Take 1 tablet by mouth daily.    . naproxen sodium (ANAPROX) 220 MG tablet Take 220 mg by mouth 2 (two) times daily with a meal.    . ONETOUCH DELICA LANCETS 99991111 MISC Test blood sugar once daily. Dx code: 250.00 100 each 12  . OVER THE COUNTER MEDICATION Vitamin B-12 3000 mcg 1 table daily.    [name] oxyCODONE 10 MG TABS Take 1 tablet (10 mg total) by mouth every 6 (six) hours as needed (for pain). 60 tablet 0  .  Phenylephrine-Acetaminophen (TYLENOL SINUS CONGESTION/PAIN PO) Take 2 tablets by mouth daily as needed (for sinus congestion).      No current facility-administered medications on file prior to visit.     Objective:  Objective Physical Exam  Constitutional: She is oriented to person, place, and time. She appears well-developed and well-nourished.  HENT:  Head: Normocephalic and atraumatic.  Eyes: Conjunctivae and EOM are normal.  Neck: Normal range of motion. Neck supple. No JVD present. Carotid bruit is not present. No thyromegaly present.  Cardiovascular: Normal  rate, regular rhythm and normal heart sounds.   No murmur heard. Pulmonary/Chest: Effort normal and breath sounds normal. No respiratory distress. She has no wheezes. She has no rales. She exhibits no tenderness.  Musculoskeletal: She exhibits no edema.  Neurological: She is alert and oriented to person, place, and time.  Psychiatric: She has a normal mood and affect. Her behavior is normal.  Nursing note and vitals reviewed.  BP 116/84 mmHg  Pulse 103  Temp(Src) 98.5 F (36.9 C) (Oral)  Ht 5\' 5"  (1.651 m)  Wt 218 lb (98.884 kg)  BMI 36.28 kg/m2  SpO2 97%  LMP 06/09/2015 Wt Readings from Last 3 Encounters:  06/11/15 218 lb (98.884 kg)  05/19/15 215 lb (97.523 kg)  02/11/15 218 lb (98.884 kg)     Lab Results  Component Value Date   WBC 10.1 12/08/2014   HGB 13.9 12/08/2014   HCT 43.2 12/08/2014   PLT 305 12/08/2014   GLUCOSE 85 11/10/2014   CHOL 144 11/10/2014   TRIG 79.0 11/10/2014   HDL 67.90 11/10/2014   LDLCALC 61 11/10/2014   ALT 30 11/10/2014   AST 19 11/10/2014   NA 138 11/10/2014   K 4.4 11/10/2014   CL 104 11/10/2014   CREATININE 1.06 11/10/2014   BUN 17 11/10/2014   CO2 27 11/10/2014   TSH 1.90 11/10/2014   HGBA1C 5.3 11/19/2014   MICROALBUR 0.5 08/28/2013    Dg Chest 2 View  12/21/2014  CLINICAL DATA:  Chest pain EXAM: CHEST  2 VIEW COMPARISON:  03/02/2014 FINDINGS: Cardiomediastinal silhouette is stable. No acute infiltrate or pleural effusion. No pulmonary edema. Minimal degenerative changes mid thoracic spine. IMPRESSION: No active cardiopulmonary disease. Electronically Signed   By: Lahoma Crocker M.D.   On: 12/21/2014 16:36     Assessment & Plan:  Plan I am having Michelle Mueller maintain her Phenylephrine-Acetaminophen (TYLENOL SINUS CONGESTION/PAIN PO), multivitamin with minerals, Vitamin D, Oxycodone HCl, gabapentin, glucose blood, ONETOUCH DELICA LANCETS 99991111, OVER THE COUNTER MEDICATION, cetirizine, hydroxychloroquine, cyclobenzaprine, levonorgestrel,  naproxen sodium, Calcium-Magnesium-Vitamin D (CALCIUM MAGNESIUM PO), buPROPion, atorvastatin, lamoTRIgine, metFORMIN, lisinopril, and levothyroxine.  Meds ordered this encounter  Medications  . DISCONTD: levothyroxine (SYNTHROID, LEVOTHROID) 50 MCG tablet    Sig: Take 1 tablet (50 mcg total) by mouth daily.    Dispense:  90 tablet    Refill:  1  . DISCONTD: metFORMIN (GLUCOPHAGE-XR) 500 MG 24 hr tablet    Sig: Take 1 tablet (500 mg total) by mouth every evening.    Dispense:  90 tablet    Refill:  1  . DISCONTD: lisinopril (PRINIVIL,ZESTRIL) 5 MG tablet    Sig: Take 1 tablet (5 mg total) by mouth daily.    Dispense:  90 tablet    Refill:  1  . metFORMIN (GLUCOPHAGE-XR) 500 MG 24 hr tablet    Sig: Take 1 tablet (500 mg total) by mouth every evening.    Dispense:  90 tablet    Refill:  1  . lisinopril (PRINIVIL,ZESTRIL) 5 MG tablet    Sig: Take 1 tablet (5 mg total) by mouth daily.    Dispense:  90 tablet    Refill:  1  . levothyroxine (SYNTHROID, LEVOTHROID) 50 MCG tablet    Sig: Take 1 tablet (50 mcg total) by mouth daily.    Dispense:  90 tablet    Refill:  1    Problem List Items Addressed This Visit      Unprioritized   Essential hypertension   Relevant Medications   lisinopril (PRINIVIL,ZESTRIL) 5 MG tablet    Other Visit Diagnoses    Controlled type 2 diabetes mellitus without complication, without long-term current use of insulin (HCC)    -  Primary    Relevant Medications    metFORMIN (GLUCOPHAGE-XR) 500 MG 24 hr tablet    lisinopril (PRINIVIL,ZESTRIL) 5 MG tablet    Other Relevant Orders    Comprehensive metabolic panel    CBC with Differential/Platelet    Hemoglobin A1c    Lipid panel    POCT urinalysis dipstick    TSH    Hemoglobin A1c    Comprehensive metabolic panel    Hypothyroidism, unspecified hypothyroidism type        Relevant Medications    levothyroxine (SYNTHROID, LEVOTHROID) 50 MCG tablet    Other Relevant Orders    Comprehensive metabolic  panel    CBC with Differential/Platelet    Hemoglobin A1c    POCT urinalysis dipstick    TSH    TSH    Hyperlipemia        Relevant Medications    lisinopril (PRINIVIL,ZESTRIL) 5 MG tablet    Other Relevant Orders    Comprehensive metabolic panel    CBC with Differential/Platelet    Hemoglobin A1c    Lipid panel    POCT urinalysis dipstick    TSH    Lipid panel    Uncontrolled type 2 diabetes mellitus without complication, without long-term current use of insulin (HCC)        Relevant Medications    metFORMIN (GLUCOPHAGE-XR) 500 MG 24 hr tablet    lisinopril (PRINIVIL,ZESTRIL) 5 MG tablet       Follow-up: Return in about 6 months (around 12/12/2015), or if symptoms worsen or fail to improve, for diabetes II, hyperlipidemia.  Ann Held, DO

## 2015-06-11 NOTE — Patient Instructions (Signed)

## 2015-06-22 ENCOUNTER — Other Ambulatory Visit: Payer: BLUE CROSS/BLUE SHIELD

## 2015-06-23 ENCOUNTER — Other Ambulatory Visit (INDEPENDENT_AMBULATORY_CARE_PROVIDER_SITE_OTHER): Payer: BLUE CROSS/BLUE SHIELD

## 2015-06-23 DIAGNOSIS — E785 Hyperlipidemia, unspecified: Secondary | ICD-10-CM

## 2015-06-23 DIAGNOSIS — E039 Hypothyroidism, unspecified: Secondary | ICD-10-CM

## 2015-06-23 DIAGNOSIS — E119 Type 2 diabetes mellitus without complications: Secondary | ICD-10-CM

## 2015-06-23 LAB — LIPID PANEL
CHOL/HDL RATIO: 1.7 ratio (ref <=5.0)
Cholesterol: 107 mg/dL — ABNORMAL LOW (ref 125–200)
HDL: 62 mg/dL (ref >=46)
LDL CALC: 38 mg/dL (ref <130)
TRIGLYCERIDES: 34 mg/dL (ref <150)
VLDL: 7 mg/dL (ref <30)

## 2015-06-23 LAB — COMPREHENSIVE METABOLIC PANEL
ALBUMIN: 3.8 g/dL (ref 3.6–5.1)
ALT: 16 U/L (ref 6–29)
AST: 16 U/L (ref 10–35)
Alkaline Phosphatase: 46 U/L (ref 33–115)
BUN: 13 mg/dL (ref 7–25)
CALCIUM: 8.3 mg/dL — AB (ref 8.6–10.2)
CHLORIDE: 106 mmol/L (ref 98–110)
CO2: 24 mmol/L (ref 20–31)
Creat: 1.03 mg/dL (ref 0.50–1.10)
Glucose, Bld: 83 mg/dL (ref 65–99)
POTASSIUM: 4.7 mmol/L (ref 3.5–5.3)
SODIUM: 140 mmol/L (ref 135–146)
Total Bilirubin: 0.4 mg/dL (ref 0.2–1.2)
Total Protein: 5.9 g/dL — ABNORMAL LOW (ref 6.1–8.1)

## 2015-06-23 LAB — HEMOGLOBIN A1C
Hgb A1c MFr Bld: 5.2 % (ref <5.7)
Mean Plasma Glucose: 103 mg/dL

## 2015-06-23 LAB — TSH: TSH: 2.3 mIU/L

## 2015-06-29 NOTE — Addendum Note (Signed)
Addended by: Damita Dunnings D on: 06/29/2015 10:12 AM   Modules accepted: Orders

## 2015-07-09 ENCOUNTER — Other Ambulatory Visit: Payer: Self-pay

## 2015-07-09 MED ORDER — ATORVASTATIN CALCIUM 10 MG PO TABS
10.0000 mg | ORAL_TABLET | Freq: Every day | ORAL | Status: DC
Start: 2015-07-09 — End: 2015-08-23

## 2015-07-23 ENCOUNTER — Encounter: Payer: Self-pay | Admitting: Family Medicine

## 2015-08-09 DIAGNOSIS — G5602 Carpal tunnel syndrome, left upper limb: Secondary | ICD-10-CM | POA: Diagnosis not present

## 2015-08-10 ENCOUNTER — Encounter: Payer: Self-pay | Admitting: Family Medicine

## 2015-08-10 ENCOUNTER — Other Ambulatory Visit: Payer: Self-pay

## 2015-08-10 DIAGNOSIS — E039 Hypothyroidism, unspecified: Secondary | ICD-10-CM

## 2015-08-10 MED ORDER — LEVOTHYROXINE SODIUM 50 MCG PO TABS: 50.0000 ug | ORAL_TABLET | Freq: Every day | ORAL | Status: DC

## 2015-08-12 ENCOUNTER — Ambulatory Visit (HOSPITAL_COMMUNITY): Payer: Self-pay | Admitting: Psychiatry

## 2015-08-19 ENCOUNTER — Ambulatory Visit (HOSPITAL_COMMUNITY): Payer: Self-pay | Admitting: Psychiatry

## 2015-08-19 ENCOUNTER — Ambulatory Visit (INDEPENDENT_AMBULATORY_CARE_PROVIDER_SITE_OTHER): Payer: Medicare Other | Admitting: Psychiatry

## 2015-08-19 ENCOUNTER — Encounter (HOSPITAL_COMMUNITY): Payer: Self-pay | Admitting: Psychiatry

## 2015-08-19 VITALS — BP 122/68 | HR 96 | Ht 65.0 in | Wt 203.0 lb

## 2015-08-19 DIAGNOSIS — F411 Generalized anxiety disorder: Secondary | ICD-10-CM | POA: Diagnosis not present

## 2015-08-19 DIAGNOSIS — G8929 Other chronic pain: Secondary | ICD-10-CM

## 2015-08-19 DIAGNOSIS — M549 Dorsalgia, unspecified: Secondary | ICD-10-CM | POA: Diagnosis not present

## 2015-08-19 DIAGNOSIS — F331 Major depressive disorder, recurrent, moderate: Secondary | ICD-10-CM

## 2015-08-19 MED ORDER — LAMOTRIGINE 100 MG PO TABS: 100.0000 mg | ORAL_TABLET | Freq: Every day | ORAL | Status: DC

## 2015-08-19 MED ORDER — BUPROPION HCL ER (XL) 300 MG PO TB24: ORAL_TABLET | ORAL | Status: DC

## 2015-08-19 NOTE — Progress Notes (Signed)
Patient ID: Michelle Mueller, female   DOB: 09-17-1965, 50 y.o.   MRN: [id number]  Psychiatric Outpatient Follow up Visit  Patient Identification: Michelle Mueller MRN:  [id number] Date of Evaluation:  08/19/2015 Referral Source: Dr. Lennox Mueller Chief Complaint:   Chief Complaint    Follow-up     Visit Diagnosis: MDD. GAD . Mood related to Chronic back pain Diagnosis:   Patient Active Problem List   Diagnosis Date Noted  . Chest pain of uncertain etiology 123456 12/21/2014  . Essential hypertension [I10] 12/21/2014  . Myalgia [M79.1] 11/10/2014  . Insomnia [G47.00] 11/10/2014  . UTI (urinary tract infection) [N39.0] 08/04/2013  . Fibromyalgia muscle pain [M79.7] 06/13/2013  . Severe depression [F32.9] 06/13/2013  . Diabetes mellitus, type II ([place]) [E11.9] 06/13/2013  . Rheumatoid arthritis (Normal) [M06.9] 03/19/2013  . Fibromyalgia [M79.7] 03/19/2013  . Unspecified hypothyroidism [E03.9] 03/19/2013  . Goiter [E04.9] 03/19/2013  . Obesity (BMI 30-39.9) [E66.9] 03/19/2013  . 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 Family history of pancreatic cancer [Z80.0] 01/18/2011  . Chronic diarrhea [K52.9] 01/18/2011  . ACUTE PHARYNGITIS [J02.9] 10/24/2010   History of Present Illness:   Patient returns for follow up visit. Initially seen by Michelle Mueller with the following " She initially presented as having  been off of psychiatric medications for approximately 7-8 years.  She had recurrence of depression and feeling of down. Pain exacerbated her depression and tiredness.  Mood disorder; she was started on Lamictal 25 mg increasing to 50. Feeling down and dysphoric concern about not able to get back with the Social Security yet. Patient pain condition exacerbates her mood and also feels neglected by her parents and gets dysphoric when she talks about the past  Pain effects her mood. Confusion between carpel tunnel or RA . Working with providers if surgery needed. Her disability has  been approved but not yet given which is making her feel overwhelmed financially.  She  does suffer from pain myofascial pain and follows up   States her parents or support system leave her when she is having an exacerbation of arthritis and fibromyalgia and she only has one support that is her kids Aggravating factor; her back pain. Sleep issues. Finances. Severity of depression. 5/10. 10 being no depression.. (worsened)  anxiety : 5/10 . 10 being extreme anxiety Medical complexity: back pain and fibromyalgia effects her sleep and depression.  (Hypo) Manic Symptoms:  none Anxiety Symptoms:  Excessive Worry, Psychotic Symptoms:  none PTSD Symptoms: Negative  Past Medical History:  Past Medical History  Diagnosis Date  . Ovarian cyst   . Diabetes mellitus without complication ([place])   . Hypothyroidism   . PONV (postoperative nausea and vomiting)   . Asthma     last attack 3 yrs ago  . Hypertension     dr Michelle Mueller  . H/O hiatal hernia   . Goiter   . Arthritis   . Chicken pox   . Anxiety   . Depression   . Fibromyalgia     Past Surgical History  Procedure Laterality Date  . Cholecystectomy    . Cesarean section    . Tonsillectomy    . Hernia repair    . Lipoma excision      L breast  . Tumor excision      Nerve sheath tumor, R arm  . Anterior lumbar fusion  12/18/2011    Procedure: ANTERIOR LUMBAR FUSION 1 LEVEL;  Surgeon: Michelle Miss, MD;  Location: [facility];  Service: Neurosurgery;  Laterality: N/A;  Lumbar four-five Anterior Lumbar Interbody Fusion with Dr. Donnetta Mueller to do anterior exposure  . Abdominal exposure  12/18/2011    Procedure: ABDOMINAL EXPOSURE;  Surgeon: Michelle Posner, MD;  Location: [facility];  Service: Vascular;  Laterality: N/A;  Anterior Expossure for anterior lumbar interbody fuson   Family History:  Family History  Problem Relation Age of Onset  . Heart disease Father   . Lung cancer Father   . Colon cancer Maternal Aunt   . Pancreatic cancer  Maternal Grandmother   . Pancreatic cancer Maternal Grandfather   . Pancreatic cancer Paternal Grandmother   . Cancer Paternal Grandfather    Social History:   Social History   Social History  . Marital Status: Divorced    Spouse Name: N/A  . Number of Children: N/A  . Years of Education: N/A   Social History Main Topics  . Smoking status: Never Smoker   . Smokeless tobacco: Never Used  . Alcohol Use: No  . Drug Use: No  . Sexual Activity: No   Other Topics Concern  . None   Social History Narrative    Musculoskeletal: Strength & Muscle Tone: within normal limits Gait & Station: normal Patient leans: N/A  Psychiatric Specialty Exam: HPI Comments: Michelle Mueller is a 50 year old DWF who is referred by her PCP for treatment of long standing depression.  She lives in [place] with her 90 year old son.  She has an older son Michelle Mueller who is 16. She is very close to her sons, but not her birth family.  She is on disability due to multiple medical problems for the past 3 years.  She was initially treated for depression at age 40 upon her first hospitalization for a suicide attempt by cutting her wrists. Her second hospitalization was in '92/'93 at [facility] in [place] at age 50 for suicidal ideation with husband's gun in her hand. She has had 3 other admissions as well with a third suicide attempt from an intentional overdose of Tegratol in '97/'98, in [place]. Her last admission was in [place] at [facility] in 2005, for depression with an accidental overdose of Ambien.     Review of Systems  Constitutional: Negative for fever.  Cardiovascular: Negative for chest pain.  Gastrointestinal: Negative for nausea.  Musculoskeletal: Positive for back pain.  Neurological: Negative for tremors.  Psychiatric/Behavioral: Positive for depression. Negative for suicidal ideas and substance abuse. The patient does not have insomnia.     Blood pressure 122/68, pulse 96, height  5\' 5"  (1.651 m), weight 203 lb (92.08 kg), SpO2 97 %.Body mass index is 33.78 kg/(m^2).  General Appearance: Well Groomed  Eye Contact:  Good  Speech:  Clear and Coherent  Volume:  Normal  Mood:  Less dysthymic  Affect:  constricted  Thought Process:  Coherent, Goal Directed, Intact, Linear and Logical  Orientation:  Full (Time, Place, and Person)  Thought Content:  WDL  Suicidal Thoughts:  No  Homicidal Thoughts:  No  Memory:  Immediate;   Good Recent;   Good Remote;   Good  Judgement:  Good  Insight:  Present  Psychomotor Activity:  Decreased  Concentration:  Fair  Recall:  Good  Fund of Knowledge:Good  Language: Good  Akathisia:  No  Handed:  Left  AIMS (if indicated):    Assets:  Communication Skills Desire for Improvement Financial Resources/Insurance Housing Resilience Transportation  ADL's:  Intact  Cognition: WNL  Sleep:  poor   Is the patient at risk to self?  No. Has the patient been a risk to self in the past 6 months?  No. Has the patient been a risk to self within the distant past?  Yes.   Is the patient a risk to others?  No.  Allergies:   Allergies  Allergen Reactions  . Erythromycin Other (See Comments)    arrhythmia  . Zocor  [Simvastatin-High Dose] Rash  . Doxycycline Other (See Comments)    Decreased BP and caused dizziness per patient   Current Medications: Current Outpatient Prescriptions  Medication Sig Dispense Refill  . atorvastatin (LIPITOR) 10 MG tablet Take 1 tablet (10 mg total) by mouth daily. 90 tablet 1  . buPROPion (WELLBUTRIN XL) 300 MG 24 hr tablet Take one a day 90 tablet 0  . Calcium-Magnesium-Vitamin D (CALCIUM MAGNESIUM PO) Take 1 tablet by mouth daily.    . cetirizine (ZYRTEC) 5 MG tablet Take 5 mg by mouth daily.    . Cholecalciferol (VITAMIN D) 2000 UNITS CAPS Take 1 capsule by mouth daily.    . cyclobenzaprine (FLEXERIL) 5 MG tablet   1  . gabapentin (NEURONTIN) 300 MG capsule Take 3 capsules by mouth at bedtime.      [name] glucose blood (ONE TOUCH ULTRA TEST) test strip Test blood sugar once daily. Dx code: 250.00 100 each 12  . hydroxychloroquine (PLAQUENIL) 200 MG tablet Take 400 mg by mouth daily.   2  . lamoTRIgine (LAMICTAL) 100 MG tablet Take 1 tablet (100 mg total) by mouth daily. Take one a day 90 tablet 0  . levonorgestrel (MIRENA) 20 MCG/24HR IUD 1 each by Intrauterine route once.    [name] levothyroxine (SYNTHROID, LEVOTHROID) 50 MCG tablet Take 1 tablet (50 mcg total) by mouth daily. 90 tablet 1  . lisinopril (PRINIVIL,ZESTRIL) 5 MG tablet Take 1 tablet (5 mg total) by mouth daily. 90 tablet 1  . metFORMIN (GLUCOPHAGE-XR) 500 MG 24 hr tablet Take 1 tablet (500 mg total) by mouth every evening. 90 tablet 1  . Multiple Vitamin (MULTIVITAMIN WITH MINERALS) TABS Take 1 tablet by mouth daily.    . naproxen sodium (ANAPROX) 220 MG tablet Take 220 mg by mouth 2 (two) times daily with a meal.    . ONETOUCH DELICA LANCETS 99991111 MISC Test blood sugar once daily. Dx code: 250.00 100 each 12  . OVER THE COUNTER MEDICATION Vitamin B-12 3000 mcg 1 table daily.    [name] oxyCODONE 10 MG TABS Take 1 tablet (10 mg total) by mouth every 6 (six) hours as needed (for pain). 60 tablet 0  . Phenylephrine-Acetaminophen (TYLENOL SINUS CONGESTION/PAIN PO) Take 2 tablets by mouth daily as needed (for sinus congestion).      No current facility-administered medications for this visit.     Treatment Plan Summary: Medication management  Depression:  Increase wellbutrin to 300mg   For depression. Increase  lamictal  to 100mg  a day for augmentation and mood symptoms. Prescription sent .  [name] No rash. Recommend therapy for her stresses.  She is to follow-up with pain management for her pain and any change in meds needed.  No clear manic symptoms in past.  GAD: She is on gabapentin for Pain and fibromyalgia .  Limit use of narcotics for pain and follow closely with pain management clinic and recommendations.   Patient will go to nearest  ED if symptoms increase or worsen. There is no concern for safety at this time. More then 50% time spent  in counseling and coordination of care including patient education. Follow up in 1 month or earlier if needed. Time spent: 25 min.  Merian Capron, MD  9:35 AM 08/19/2015

## 2015-08-23 ENCOUNTER — Encounter: Payer: Self-pay | Admitting: Family Medicine

## 2015-08-23 MED ORDER — ATORVASTATIN CALCIUM 10 MG PO TABS: 10.0000 mg | ORAL_TABLET | Freq: Every day | ORAL | 1 refills | Status: DC

## 2015-08-25 DIAGNOSIS — G5602 Carpal tunnel syndrome, left upper limb: Secondary | ICD-10-CM | POA: Diagnosis not present

## 2015-09-07 ENCOUNTER — Ambulatory Visit (INDEPENDENT_AMBULATORY_CARE_PROVIDER_SITE_OTHER): Payer: BLUE CROSS/BLUE SHIELD | Admitting: Licensed Clinical Social Worker

## 2015-09-07 ENCOUNTER — Encounter (HOSPITAL_COMMUNITY): Payer: Self-pay | Admitting: Licensed Clinical Social Worker

## 2015-09-07 DIAGNOSIS — F331 Major depressive disorder, recurrent, moderate: Secondary | ICD-10-CM | POA: Diagnosis not present

## 2015-09-07 DIAGNOSIS — F411 Generalized anxiety disorder: Secondary | ICD-10-CM
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
## 2015-09-09 DIAGNOSIS — M791 Myalgia: Secondary | ICD-10-CM | POA: Diagnosis not present

## 2015-09-09 DIAGNOSIS — Z6839 Body mass index (BMI) 39.0-39.9, adult: Secondary | ICD-10-CM | POA: Diagnosis not present

## 2015-09-09 DIAGNOSIS — M0600 Rheumatoid arthritis without rheumatoid factor, unspecified site: Secondary | ICD-10-CM | POA: Diagnosis not present

## 2015-09-09 DIAGNOSIS — M545 Low back pain: Secondary | ICD-10-CM | POA: Diagnosis not present

## 2015-09-14 ENCOUNTER — Ambulatory Visit (INDEPENDENT_AMBULATORY_CARE_PROVIDER_SITE_OTHER): Payer: BLUE CROSS/BLUE SHIELD | Admitting: Licensed Clinical Social Worker

## 2015-09-14 DIAGNOSIS — F411 Generalized anxiety disorder: Secondary | ICD-10-CM

## 2015-09-14 DIAGNOSIS — F331 Major depressive disorder, recurrent, moderate: Secondary | ICD-10-CM | POA: Diagnosis not present

## 2015-09-14 NOTE — Progress Notes (Signed)
   THERAPIST PROGRESS NOTE  Session Time: 3:05 PM to 4 PM  Participation Level: Active  Behavioral Response: CasualAlertDysphoric and Areas of time euthymic in addition to dysphoria  Type of Therapy: Individual Therapy  Treatment Goals addressed:  coping, decrease in anxiety and depression, challenge self-defeating thoughts, work on sources and childhood that have led to poor strategies and coping  Interventions: CBT, Solution Focused, Strength-based, Supportive, Reframing and Other: Skills to build self-esteem  Summary: Michelle Mueller is a 50 y.o. female with review of symptoms and relates that she has flares up then the fatigue is horrid. She will try to push herself, gets dizzy, then have to lay down, and then will trry to fight it again. Yesterday, she gave into it, did not fight it and stayed in bed. She shared that there it is a lot more than medical issues. There are abandonment issues. She has abandonment issues originating from when she was young. Has struggled with feeling you are not worth anything if you are producing anything. Related that it has been shameful and humiliating to view herself as someone on disability but also understands she can't base it external things. Realizes that working on herself means working on her spirituality. This will help teach her the lessons she is meant to learn through her struggles, help her to recognize her own value and help her to grow. She knows that she has to grow in self-confidence in terms of her perception that people don't like her. Able to trace this back to when she was young and not being good enough to live up to standards of church. Realizes that she has to work on wanting to do things her way and pushing. She has to work on patience and one way this is showing up for her is in a developing relationship. She has to use this time to work on herself, work on growth, not to focus so much on the negative, challenge perception of being  misfortunate, and recognizing her worth. Shared that source of growth will come through her spirituality and relationship with God. This will help her realize that the world doesn't decide value, teach her to compassionate, patient and understanding, and build a solid foundation.    Suicidal/Homicidal: No  Therapist Response: Supported patient in significant coping strategy of patient's spirituality and working on spirituality to help her in her growth and change. Encouraged patient to recognize unconditional self-worth and that is not determine from external sources. Encouraged patient on issues she identified that she is going to work on including patience, more appreciation for life, recognition of her value, andchallenge her tendency to focus on the negative. Encouraged patient to find moments of joy in the day to help her and increasing appreciation for life and finding value in her life. Magnus Ivan abandonment as core issue and will uncover underlying experiences and believes that have led to symptoms. We'll work on challenging and replacing unhelpful beliefs and perceptions.    Plan: Return again in 1 week.2. Patient continue to work on effective coping strategies that will help with changing growth.  Diagnosis: Axis I: Major depressive disorder, recurrent, moderate, generalized anxiety disorder    Axis II: No diagnosis    Bowman,Mary A, LCSW 09/14/2015

## 2015-09-20 ENCOUNTER — Ambulatory Visit (INDEPENDENT_AMBULATORY_CARE_PROVIDER_SITE_OTHER): Payer: BLUE CROSS/BLUE SHIELD | Admitting: Licensed Clinical Social Worker

## 2015-09-20 DIAGNOSIS — F411 Generalized anxiety disorder: Secondary | ICD-10-CM

## 2015-09-20 DIAGNOSIS — F331 Major depressive disorder, recurrent, moderate: Secondary | ICD-10-CM | POA: Diagnosis not present
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
## 2015-09-28 ENCOUNTER — Ambulatory Visit (HOSPITAL_COMMUNITY): Payer: Self-pay | Admitting: Licensed Clinical Social Worker

## 2015-10-12 ENCOUNTER — Ambulatory Visit (INDEPENDENT_AMBULATORY_CARE_PROVIDER_SITE_OTHER): Payer: BLUE CROSS/BLUE SHIELD | Admitting: Psychiatry

## 2015-10-12 ENCOUNTER — Encounter (HOSPITAL_COMMUNITY): Payer: Self-pay | Admitting: Psychiatry

## 2015-10-12 VITALS — BP 118/64 | HR 86 | Resp 16 | Ht 65.0 in | Wt 192.0 lb

## 2015-10-12 DIAGNOSIS — F411 Generalized anxiety disorder: Secondary | ICD-10-CM | POA: Diagnosis not present

## 2015-10-12 DIAGNOSIS — G8929 Other chronic pain: Secondary | ICD-10-CM

## 2015-10-12 DIAGNOSIS — F331 Major depressive disorder, recurrent, moderate: Secondary | ICD-10-CM

## 2015-10-12 DIAGNOSIS — M549 Dorsalgia, unspecified: Secondary | ICD-10-CM

## 2015-10-12 MED ORDER — LAMOTRIGINE 100 MG PO TABS: 50.0000 mg | ORAL_TABLET | Freq: Every day | ORAL | 0 refills | Status: DC

## 2015-10-12 MED ORDER — BUSPIRONE HCL 7.5 MG PO TABS: 7.5000 mg | ORAL_TABLET | Freq: Three times a day (TID) | ORAL | 0 refills | Status: DC

## 2015-10-12 NOTE — Progress Notes (Signed)
Patient ID: Michelle Mueller, female   DOB: 1965/09/28, 50 y.o.   MRN: [id number]  Psychiatric Outpatient Follow up Visit  Patient Identification: Michelle Mueller MRN:  [id number] Date of Evaluation:  10/12/2015 Referral Source: Dr. Lennox Mueller Chief Complaint:   Chief Complaint    Follow-up     Visit Diagnosis: MDD. GAD . Mood related to Chronic back pain Diagnosis:   Patient Active Problem List   Diagnosis Date Noted  . Chest pain of uncertain etiology 123456 12/21/2014  . Essential hypertension [I10] 12/21/2014  . Myalgia [M79.1] 11/10/2014  . Insomnia [G47.00] 11/10/2014  . UTI (urinary tract infection) [N39.0] 08/04/2013  . Fibromyalgia muscle pain [M79.7] 06/13/2013  . Severe depression [F32.9] 06/13/2013  . Diabetes mellitus, type II ([place]) [E11.9] 06/13/2013  . Rheumatoid arthritis ([place]) [M06.9] 03/19/2013  . Fibromyalgia [M79.7] 03/19/2013  . Unspecified hypothyroidism [E03.9] 03/19/2013  . Goiter [E04.9] 03/19/2013  . Obesity (BMI 30-39.9) [E66.9] 03/19/2013  . 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 Family history of pancreatic cancer [Z80.0] 01/18/2011  . Chronic diarrhea [K52.9] 01/18/2011  . ACUTE PHARYNGITIS [J02.9] 10/24/2010   History of Present Illness:   Patient returns for follow up visit. Initially seen by Michelle Mueller with the following " She initially presented as having  been off of psychiatric medications for approximately 7-8 years.  She had recurrence of depression and feeling of down. Pain exacerbated her depression and tiredness.  Mood disorder; Last visit we increased her Lamictal to 100 mg apparently for some reason she cut it down to 50  She says she is not sure why but she felt that maybe she is getting more anxious.  Her wellbuttrin was increased to 300mg  and it has helped some depression. She is more concern of anxiety and rejection from family members as per history despite writing a text to her sister. Still bad response.   Pain  effects her mood. Confusion between carpel tunnel or RA . Working with providers if surgery needed. Her disability has been approved but not yet given which is making her feel overwhelmed financially.  She  does suffer from pain myofascial pain and follows up   States her parents or support system leave her when she is having an exacerbation of arthritis and fibromyalgia and she only has one support that is her kids Aggravating factor; her back pain. Sleep issues. Finances. Severity of depression. 5/10. 10 being no depression.[name]   anxiety : 5/10 . 10 being extreme anxiety; Increased  Medical complexity: back pain and fibromyalgia effects her sleep and depression.  (Hypo) Manic Symptoms:  none Anxiety Symptoms:  Excessive Worry, Psychotic Symptoms:  none PTSD Symptoms: Negative  Past Medical History:  Past Medical History:  Diagnosis Date  . Anxiety   . Arthritis   . Asthma    last attack 3 yrs ago  . Chicken pox   . Depression   . Diabetes mellitus without complication ([place])   . Fibromyalgia   . Goiter   . H/O hiatal hernia   . Hypertension    [name]  . Hypothyroidism   . Ovarian cyst   . PONV (postoperative nausea and vomiting)     Past Surgical History:  Procedure Laterality Date  . ABDOMINAL EXPOSURE  12/18/2011   Procedure: ABDOMINAL EXPOSURE;  Surgeon: [name], MD;  Location: [facility];  Service: Vascular;  Laterality: N/A;  Anterior Expossure for anterior lumbar interbody fuson  . ANTERIOR LUMBAR FUSION  12/18/2011   Procedure:  ANTERIOR LUMBAR FUSION 1 LEVEL;  Surgeon: [name], MD;  Location: [facility];  Service: Neurosurgery;  Laterality: N/A;  Lumbar four-five Anterior Lumbar Interbody Fusion with Dr. [name] to do anterior exposure  . CESAREAN SECTION    . CHOLECYSTECTOMY    . HERNIA REPAIR    . LIPOMA EXCISION     L breast  . TONSILLECTOMY    . TUMOR EXCISION     Nerve sheath tumor, R arm   Family History:  Family History  Problem Relation  Age of Onset  . Heart disease Father   . Lung cancer Father   . Colon cancer Maternal Aunt   . Pancreatic cancer Maternal Grandmother   . Pancreatic cancer Maternal Grandfather   . Pancreatic cancer Paternal Grandmother   . Cancer Paternal Grandfather   . Sexual abuse Neg Hx    Social History:   Social History   Social History  . Marital status: Divorced    Spouse name: N/A  . Number of children: N/A  . Years of education: N/A   Social History Main Topics  . Smoking status: Never Smoker  . Smokeless tobacco: Never Used  . Alcohol use No  . Drug use: No  . Sexual activity: No   Other Topics Concern  . None   Social History Narrative  . None    Musculoskeletal: Strength & Muscle Tone: within normal limits Gait & Station: normal Patient leans: N/A  Psychiatric Specialty Exam: Michelle Mueller is a 50 year old DWF who is referred by her PCP for treatment of long standing depression.  She lives in [place] with her 27 year old son.  She has an older son Michelle Mueller who is 66. She is very close to her sons, but not her birth family.  She is on disability due to multiple medical problems for the past 3 years.  She was initially treated for depression at age 50 upon her first hospitalization for a suicide attempt by cutting her wrists. Her second hospitalization was in '92/'93 at [facility] in [place] at age 50 for suicidal ideation with husband's gun in her hand. She has had 3 other admissions as well with a third suicide attempt from an intentional overdose of Tegratol in '97/'98, in [place]. Her last admission was in [place] at [facility] in 2005, for depression with an accidental overdose of Ambien.      Review of Systems  Constitutional: Negative for fever.  Cardiovascular: Negative for chest pain.  Gastrointestinal: Negative for nausea.  Musculoskeletal: Positive for back pain.  Neurological: Negative for tremors.  Psychiatric/Behavioral: Positive for  depression. Negative for substance abuse and suicidal ideas. The patient is nervous/anxious. The patient does not have insomnia.     Blood pressure 118/64, pulse 86, resp. rate 16, height 5\' 5"  (1.651 m), weight 192 lb (87.1 kg), SpO2 96 %.Body mass index is 31.95 kg/m.  General Appearance: Well Groomed  Eye Contact:  Good  Speech:  Clear and Coherent  Volume:  Normal  Mood:  anxious  Affect:  reactive  Thought Process:  Coherent, Goal Directed, Intact, Linear and Logical  Orientation:  Full (Time, Place, and Person)  Thought Content:  WDL  Suicidal Thoughts:  No  Homicidal Thoughts:  No  Memory:  Immediate;   Good Recent;   Good Remote;   Good  Judgement:  Good  Insight:  Present  Psychomotor Activity:  Decreased  Concentration:  Fair  Recall:  [place] of Knowledge:Good  Language: Good  Akathisia:  No  Handed:  Left  AIMS (if indicated):    Assets:  Communication Skills Desire for Improvement Financial Resources/Insurance Housing Resilience Transportation  ADL's:  Intact  Cognition: WNL  Sleep:  poor   Is the patient at risk to self?  No. Has the patient been a risk to self in the past 6 months?  No. Has the patient been a risk to self within the distant past?  Yes.   Is the patient a risk to others?  No.  Allergies:   Allergies  Allergen Reactions  . Erythromycin Other (See Comments)    arrhythmia  . Zocor  [Simvastatin-High Dose] Rash  . Doxycycline Other (See Comments)    Decreased BP and caused dizziness per patient   Current Medications: Current Outpatient Prescriptions  Medication Sig Dispense Refill  . atorvastatin (LIPITOR) 10 MG tablet Take 1 tablet (10 mg total) by mouth daily. 90 tablet 1  . buPROPion (WELLBUTRIN XL) 300 MG 24 hr tablet Take one a day 90 tablet 0  . Calcium-Magnesium-Vitamin D (CALCIUM MAGNESIUM PO) Take 1 tablet by mouth daily.    . cetirizine (ZYRTEC) 5 MG tablet Take 5 mg by mouth daily.    . Cholecalciferol (VITAMIN D)  2000 UNITS CAPS Take 1 capsule by mouth daily.    . cyclobenzaprine (FLEXERIL) 5 MG tablet   1  . gabapentin (NEURONTIN) 300 MG capsule Take 3 capsules by mouth at bedtime.     [name] glucose blood (ONE TOUCH ULTRA TEST) test strip Test blood sugar once daily. Dx code: 250.00 100 each 12  . hydroxychloroquine (PLAQUENIL) 200 MG tablet Take 400 mg by mouth daily.   2  . lamoTRIgine (LAMICTAL) 100 MG tablet Take 0.5 tablets (50 mg total) by mouth daily. Take one a day 30 tablet 0  . levonorgestrel (MIRENA) 20 MCG/24HR IUD 1 each by Intrauterine route once.    [name] levothyroxine (SYNTHROID, LEVOTHROID) 50 MCG tablet Take 1 tablet (50 mcg total) by mouth daily. 90 tablet 1  . lisinopril (PRINIVIL,ZESTRIL) 5 MG tablet Take 1 tablet (5 mg total) by mouth daily. 90 tablet 1  . metFORMIN (GLUCOPHAGE-XR) 500 MG 24 hr tablet Take 1 tablet (500 mg total) by mouth every evening. 90 tablet 1  . Multiple Vitamin (MULTIVITAMIN WITH MINERALS) TABS Take 1 tablet by mouth daily.    . naproxen sodium (ANAPROX) 220 MG tablet Take 220 mg by mouth 2 (two) times daily with a meal.    . ONETOUCH DELICA LANCETS 99991111 MISC Test blood sugar once daily. Dx code: 250.00 100 each 12  . OVER THE COUNTER MEDICATION Vitamin B-12 3000 mcg 1 table daily.    [name] oxyCODONE 10 MG TABS Take 1 tablet (10 mg total) by mouth every 6 (six) hours as needed (for pain). 60 tablet 0  . Phenylephrine-Acetaminophen (TYLENOL SINUS CONGESTION/PAIN PO) Take 2 tablets by mouth daily as needed (for sinus congestion).     . busPIRone (BUSPAR) 7.5 MG tablet Take 1 tablet (7.5 mg total) by mouth 3 (three) times daily. 60 tablet 0   No current facility-administered medications for this visit.      Treatment Plan Summary: Medication management  Depression:  Continue  wellbutrin 300mg   For depression. Lower  lamictal  to 50mg  a day as she has been taking.  Recommend therapy for her stresses and how to deal with family members and feeling of rejection.  She  is to follow-up with pain management for her pain  and any change in meds needed.  No clear manic symptoms in past.  GAD: She is on gabapentin for Pain and fibromyalgia .  Will add buspar small dose of 7.5mg  qd or bid .  Limit use of narcotics for pain and follow closely with pain management clinic and recommendations.   Patient will go to nearest ED if symptoms increase or worsen. There is no concern for safety at this time. More then 50% time spent in counseling and coordination of care including patient education. Follow up in 1 month or earlier if needed. Time spent: 25 min.  Merian Capron, MD  1:44 PM 10/12/2015

## 2015-10-13 ENCOUNTER — Ambulatory Visit: Payer: Self-pay | Admitting: Family

## 2015-10-14 ENCOUNTER — Ambulatory Visit (INDEPENDENT_AMBULATORY_CARE_PROVIDER_SITE_OTHER): Payer: BLUE CROSS/BLUE SHIELD | Admitting: Family Medicine

## 2015-10-14 ENCOUNTER — Telehealth (HOSPITAL_COMMUNITY): Payer: Self-pay | Admitting: *Deleted

## 2015-10-14 ENCOUNTER — Encounter: Payer: Self-pay | Admitting: Family Medicine

## 2015-10-14 VITALS — BP 112/82 | HR 81 | Temp 98.2°F | Resp 16 | Ht 65.0 in | Wt 192.8 lb

## 2015-10-14 DIAGNOSIS — F329 Major depressive disorder, single episode, unspecified: Secondary | ICD-10-CM

## 2015-10-14 DIAGNOSIS — N951 Menopausal and female climacteric states: Secondary | ICD-10-CM | POA: Diagnosis not present

## 2015-10-14 DIAGNOSIS — R42 Dizziness and giddiness: Secondary | ICD-10-CM

## 2015-10-14 DIAGNOSIS — R5382 Chronic fatigue, unspecified: Secondary | ICD-10-CM

## 2015-10-14 DIAGNOSIS — R232 Flushing: Secondary | ICD-10-CM

## 2015-10-14 DIAGNOSIS — E119 Type 2 diabetes mellitus without complications: Secondary | ICD-10-CM | POA: Diagnosis not present

## 2015-10-14 DIAGNOSIS — F322 Major depressive disorder, single episode, severe without psychotic features: Secondary | ICD-10-CM

## 2015-10-14 LAB — POCT URINALYSIS DIPSTICK
Bilirubin, UA: NEGATIVE
Glucose, UA: NEGATIVE
Ketones, UA: NEGATIVE
Leukocytes, UA: NEGATIVE
NITRITE UA: NEGATIVE
PH UA: 5.5
RBC UA: NEGATIVE
Spec Grav, UA: >=1.03
UROBILINOGEN UA: 0.2

## 2015-10-14 NOTE — Progress Notes (Signed)
Pre visit review using our clinic review tool, if applicable. No additional management support is needed unless otherwise documented below in the visit note. 

## 2015-10-14 NOTE — Assessment & Plan Note (Signed)
Per psychiatry 

## 2015-10-14 NOTE — Progress Notes (Signed)
Patient ID: Michelle Mueller, female    DOB: 05/03/1965  Age: 50 y.o. MRN: [id number]    Subjective:  Subjective  HPI Michelle Mueller presents for dizziness, depression,  Crying for no reason,  Pt has mirena so has not had a period since mirena was taken out and then she bleed for months until put back in.     Review of Systems  Constitutional: Positive for fatigue. Negative for activity change, appetite change and unexpected weight change.  Respiratory: Negative for cough and shortness of breath.   Cardiovascular: Negative for chest pain and palpitations.  Neurological: Positive for dizziness.  Psychiatric/Behavioral: Positive for decreased concentration, dysphoric mood and sleep disturbance. Negative for behavioral problems. The patient is not nervous/anxious.     History Past Medical History:  Diagnosis Date  . Anxiety   . Arthritis   . Asthma    last attack 3 yrs ago  . Chicken pox   . Depression   . Diabetes mellitus without complication ([place])   . Fibromyalgia   . Goiter   . H/O hiatal hernia   . Hypertension    [name]  . Hypothyroidism   . Ovarian cyst   . PONV (postoperative nausea and vomiting)     She has a past surgical history that includes Cholecystectomy; Cesarean section; Tonsillectomy; Hernia repair; Lipoma excision; Tumor excision; Anterior lumbar fusion (12/18/2011); and Abdominal exposure (12/18/2011).   Her family history includes Cancer in her paternal grandfather; Colon cancer in her maternal aunt; Heart disease in her father; Lung cancer in her father; Pancreatic cancer in her maternal grandfather, maternal grandmother, and paternal grandmother.She reports that she has never smoked. She has never used smokeless tobacco. She reports that she does not drink alcohol or use drugs.  Current Outpatient Prescriptions on File Prior to Visit  Medication Sig Dispense Refill  . atorvastatin (LIPITOR) 10 MG tablet Take 1 tablet (10 mg total) by mouth daily. 90 tablet  1  . buPROPion (WELLBUTRIN XL) 300 MG 24 hr tablet Take one a day 90 tablet 0  . busPIRone (BUSPAR) 7.5 MG tablet Take 1 tablet (7.5 mg total) by mouth 3 (three) times daily. 60 tablet 0  . Calcium-Magnesium-Vitamin D (CALCIUM MAGNESIUM PO) Take 1 tablet by mouth daily.    . cetirizine (ZYRTEC) 5 MG tablet Take 5 mg by mouth daily.    . Cholecalciferol (VITAMIN D) 2000 UNITS CAPS Take 1 capsule by mouth daily.    . cyclobenzaprine (FLEXERIL) 5 MG tablet   1  . gabapentin (NEURONTIN) 300 MG capsule Take 3 capsules by mouth at bedtime.     [name] glucose blood (ONE TOUCH ULTRA TEST) test strip Test blood sugar once daily. Dx code: 250.00 100 each 12  . hydroxychloroquine (PLAQUENIL) 200 MG tablet Take 400 mg by mouth daily.   2  . lamoTRIgine (LAMICTAL) 100 MG tablet Take 0.5 tablets (50 mg total) by mouth daily. Take one a day 30 tablet 0  . levonorgestrel (MIRENA) 20 MCG/24HR IUD 1 each by Intrauterine route once.    [name] levothyroxine (SYNTHROID, LEVOTHROID) 50 MCG tablet Take 1 tablet (50 mcg total) by mouth daily. 90 tablet 1  . lisinopril (PRINIVIL,ZESTRIL) 5 MG tablet Take 1 tablet (5 mg total) by mouth daily. 90 tablet 1  . metFORMIN (GLUCOPHAGE-XR) 500 MG 24 hr tablet Take 1 tablet (500 mg total) by mouth every evening. 90 tablet 1  . Multiple Vitamin (MULTIVITAMIN WITH MINERALS) TABS Take 1 tablet by mouth daily.    [name]  naproxen sodium (ANAPROX) 220 MG tablet Take 220 mg by mouth 2 (two) times daily with a meal.    . ONETOUCH DELICA LANCETS 99991111 MISC Test blood sugar once daily. Dx code: 250.00 100 each 12  . OVER THE COUNTER MEDICATION Vitamin B-12 3000 mcg 1 table daily.    [name] oxyCODONE 10 MG TABS Take 1 tablet (10 mg total) by mouth every 6 (six) hours as needed (for pain). 60 tablet 0  . Phenylephrine-Acetaminophen (TYLENOL SINUS CONGESTION/PAIN PO) Take 2 tablets by mouth daily as needed (for sinus congestion).      No current facility-administered medications on file prior to visit.        Objective:  Objective  Physical Exam  Constitutional: She is oriented to person, place, and time. She appears well-developed and well-nourished.  HENT:  Head: Normocephalic and atraumatic.  Eyes: Conjunctivae and EOM are normal.  Neck: Normal range of motion. Neck supple. No JVD present. Carotid bruit is not present. No thyromegaly present.  Cardiovascular: Normal rate, regular rhythm and normal heart sounds.   No murmur heard. Pulmonary/Chest: Effort normal and breath sounds normal. No respiratory distress. She has no wheezes. She has no rales. She exhibits no tenderness.  Musculoskeletal: She exhibits no edema.  Neurological: She is alert and oriented to person, place, and time.  Psychiatric: Her behavior is normal. Judgment and thought content normal. Cognition and memory are normal. She exhibits a depressed mood.  Nursing note and vitals reviewed.  BP 112/82 (BP Location: Left Arm, Patient Position: Sitting, Cuff Size: Normal)   Pulse 81   Temp 98.2 F (36.8 C) (Oral)   Resp 16   Ht 5\' 5"  (1.651 m)   Wt 192 lb 12.8 oz (87.5 kg)   LMP 08/13/2015 (Approximate)   SpO2 97%   BMI 32.08 kg/m  Wt Readings from Last 3 Encounters:  10/14/15 192 lb 12.8 oz (87.5 kg)  10/12/15 192 lb (87.1 kg)  08/19/15 203 lb (92.1 kg)     Lab Results  Component Value Date   WBC 10.1 12/08/2014   HGB 13.9 12/08/2014   HCT 43.2 12/08/2014   PLT 305 12/08/2014   GLUCOSE 83 06/23/2015   CHOL 107 (L) 06/23/2015   TRIG 34 06/23/2015   HDL 62 06/23/2015   LDLCALC 38 06/23/2015   ALT 16 06/23/2015   AST 16 06/23/2015   NA 140 06/23/2015   K 4.7 06/23/2015   CL 106 06/23/2015   CREATININE 1.03 06/23/2015   BUN 13 06/23/2015   CO2 24 06/23/2015   TSH 2.30 06/23/2015   HGBA1C 5.2 06/23/2015   MICROALBUR 0.5 08/28/2013    Dg Chest 2 View  Result Date: 12/21/2014 CLINICAL DATA:  Chest pain EXAM: CHEST  2 VIEW COMPARISON:  03/02/2014 FINDINGS: Cardiomediastinal silhouette is stable. No  acute infiltrate or pleural effusion. No pulmonary edema. Minimal degenerative changes mid thoracic spine. IMPRESSION: No active cardiopulmonary disease. Electronically Signed   By: Lahoma Crocker M.D.   On: 12/21/2014 16:36     Assessment & Plan:  Plan  I am having Ms. Risinger maintain her Phenylephrine-Acetaminophen (TYLENOL SINUS CONGESTION/PAIN PO), multivitamin with minerals, Vitamin D, Oxycodone HCl, gabapentin, glucose blood, ONETOUCH DELICA LANCETS 99991111, OVER THE COUNTER MEDICATION, cetirizine, hydroxychloroquine, cyclobenzaprine, levonorgestrel, naproxen sodium, Calcium-Magnesium-Vitamin D (CALCIUM MAGNESIUM PO), metFORMIN, lisinopril, levothyroxine, buPROPion, atorvastatin, lamoTRIgine, and busPIRone.  No orders of the defined types were placed in this encounter.   Problem List Items Addressed This Visit      Unprioritized  Severe depression    Per psychiatry       Other Visit Diagnoses    Chronic fatigue    -  Primary   Relevant Orders   CBC with Differential/Platelet   Comprehensive metabolic panel   Hemoglobin A1c   TSH   POCT urinalysis dipstick (Completed)   Vitamin B12   Vitamin D 1,25 dihydroxy   Estradiol, Ultra Sens   FSH   Prolactin   B-HCG Quant   Hot flashes       Relevant Orders   CBC with Differential/Platelet   Comprehensive metabolic panel   Hemoglobin A1c   TSH   POCT urinalysis dipstick (Completed)   Vitamin B12   Vitamin D 1,25 dihydroxy   Estradiol, Ultra Sens   FSH   Prolactin   B-HCG Quant   Dizziness and giddiness       Relevant Orders   CBC with Differential/Platelet   Comprehensive metabolic panel   Hemoglobin A1c   TSH   POCT urinalysis dipstick (Completed)   Vitamin B12   Vitamin D 1,25 dihydroxy   Estradiol, Ultra Sens   FSH   Prolactin   B-HCG Quant      Follow-up: No Follow-up on file.  Ann Held, DO

## 2015-10-14 NOTE — Patient Instructions (Signed)

## 2015-10-14 NOTE — Telephone Encounter (Signed)
Pt was seen in clinic on 10/12/15, received rx for Buspar 7.5mg . Rx directions states to take medication TID daily, but was only given 60 tablets for 30 days. Pt would like clarification for the number of pills she needs to take daily. Pt would like a 90 prescription sent to pharmacy due to cost of medication.  Please advise.

## 2015-10-15 LAB — CBC WITH DIFFERENTIAL/PLATELET
BASOS PCT: 0.4 % (ref 0.0–3.0)
Basophils Absolute: 0 10*3/uL (ref 0.0–0.1)
EOS ABS: 0.1 10*3/uL (ref 0.0–0.7)
Eosinophils Relative: 1.6 % (ref 0.0–5.0)
HCT: 38.5 % (ref 36.0–46.0)
HEMOGLOBIN: 13.1 g/dL (ref 12.0–15.0)
Lymphocytes Relative: 37.3 % (ref 12.0–46.0)
Lymphs Abs: 2.6 10*3/uL (ref 0.7–4.0)
MCHC: 34 g/dL (ref 30.0–36.0)
MCV: 83 fl (ref 78.0–100.0)
MONO ABS: 0.4 10*3/uL (ref 0.1–1.0)
Monocytes Relative: 6.2 % (ref 3.0–12.0)
Neutro Abs: 3.8 10*3/uL (ref 1.4–7.7)
Neutrophils Relative %: 54.5 % (ref 43.0–77.0)
Platelets: 270 10*3/uL (ref 150.0–400.0)
RBC: 4.64 Mil/uL (ref 3.87–5.11)
RDW: 14.6 % (ref 11.5–15.5)
WBC: 6.9 10*3/uL (ref 4.0–10.5)

## 2015-10-15 LAB — COMPREHENSIVE METABOLIC PANEL
ALBUMIN: 4.3 g/dL (ref 3.5–5.2)
ALT: 17 U/L (ref 0–35)
AST: 19 U/L (ref 0–37)
Alkaline Phosphatase: 49 U/L (ref 39–117)
BILIRUBIN TOTAL: 0.5 mg/dL (ref 0.2–1.2)
BUN: 17 mg/dL (ref 6–23)
CALCIUM: 9.2 mg/dL (ref 8.4–10.5)
CHLORIDE: 104 meq/L (ref 96–112)
CO2: 29 mEq/L (ref 19–32)
CREATININE: 1.17 mg/dL (ref 0.40–1.20)
GFR: 52.34 mL/min — AB (ref >60.00)
Glucose, Bld: 88 mg/dL (ref 70–99)
Potassium: 4.2 mEq/L (ref 3.5–5.1)
SODIUM: 141 meq/L (ref 135–145)
Total Protein: 7 g/dL (ref 6.0–8.3)

## 2015-10-15 LAB — VITAMIN B12: VITAMIN B 12: 1382 pg/mL — AB (ref 211–911)

## 2015-10-15 LAB — TSH: TSH: 0.92 u[IU]/mL (ref 0.35–4.50)

## 2015-10-15 LAB — HEMOGLOBIN A1C: HEMOGLOBIN A1C: 5.5 % (ref 4.6–6.5)

## 2015-10-15 LAB — FOLLICLE STIMULATING HORMONE: FSH: 91 m[IU]/mL

## 2015-10-15 LAB — HCG, QUANTITATIVE, PREGNANCY: QUANTITATIVE HCG: 1.65 m[IU]/mL

## 2015-10-15 NOTE — Telephone Encounter (Signed)
Return telephone call to pt. Per Dr. De Nurse, please informed pt she may take up to Buspar twice a day. 60 per month is recommended or less if takes ones a day at times.  Cannot give 90 day supply as this is a start medication for now and not maintenance dose till we see its effect. Pt verbalizes understanding.

## 2015-10-15 NOTE — Telephone Encounter (Signed)
Directed to take upto twice a day. 60 per month is recommended or less if takes ones a day at times.  Cannot give 90 day supply as this is a start medication for now and not maintenance dose till we see its effect.

## 2015-10-16 LAB — PROLACTIN: Prolactin: 8.8 ng/mL

## 2015-10-17 LAB — ESTRADIOL, ULTRA SENS: Estradiol, Ultra Sensitive: 23 pg/mL

## 2015-10-17 LAB — VITAMIN D 1,25 DIHYDROXY
Vitamin D 1, 25 (OH)2 Total: 22 pg/mL (ref 18–72)
Vitamin D3 1, 25 (OH)2: 22 pg/mL

## 2015-10-19 ENCOUNTER — Encounter: Payer: Self-pay | Admitting: Family Medicine

## 2015-10-19 ENCOUNTER — Other Ambulatory Visit: Payer: Self-pay | Admitting: Family Medicine

## 2015-10-19 DIAGNOSIS — E559 Vitamin D deficiency, unspecified: Secondary | ICD-10-CM

## 2015-10-19 MED ORDER — VITAMIN D (ERGOCALCIFEROL) 1.25 MG (50000 UNIT) PO CAPS: 50000.0000 [IU] | ORAL_CAPSULE | ORAL | 1 refills | Status: DC

## 2015-11-08 ENCOUNTER — Ambulatory Visit (INDEPENDENT_AMBULATORY_CARE_PROVIDER_SITE_OTHER): Payer: BLUE CROSS/BLUE SHIELD | Admitting: Licensed Clinical Social Worker

## 2015-11-08 DIAGNOSIS — F411 Generalized anxiety disorder: Secondary | ICD-10-CM | POA: Diagnosis not present

## 2015-11-08 DIAGNOSIS — F331 Major depressive disorder, recurrent, moderate: Secondary | ICD-10-CM | POA: Diagnosis not present

## 2015-11-08 NOTE — Progress Notes (Signed)
   THERAPIST PROGRESS NOTE  Session Time: 1 PM to 1:55 PM  Participation Level: Active  Behavioral Response: CasualAlertDysphoric  Type of Therapy: Individual Therapy  Treatment Goals addressed:  work on challenging unhelpful thought patterns, emotional regulation skills and self-esteem  Interventions: Solution Focused, Strength-based, Supportive, Reframing and Other: Skills to build self-esteem, healthy relationship skills  Summary: Michelle Mueller is a 50 y.o. female who not doing well and relates breakup with recent boyfriend. She relates that she can push too hard and this is related to fears of rejection but also realizes that guys don't want somebody who is so clingy. Reviewed specifics of recent relationship and realizes that he can scream and yell, often not supportive and was lying to her. Shared that she really wants a relationship, she believes because she requires affection and at the beginning she can't enjoy because she has fear of rejection, and not wanting to see her again. She can't handle it when they reject her and she described other relationships where she has concerns of rejection. She is okay on her own but not the same in a relationship. Related that her past therapist believes that she destroys relationships because she wants them badly. She described recent problems was relationship as causing major anxiety. Describes anxiety through the roof, perimenapause, and not sleeping. She forgot to take medicine a couple times and with through withdrawal. Discussed relationship with mom as a child and relates she was not treated well, she passed her off to others, shamed her all her life, for example, because of her mental health issues. She needed to be productive to be valued and they didn't believe in her mental health issues.  Discussed that our value is internally determined. Reviewed symptoms of borderline and discussed that patient needs to work on effective interpersonal  skills, emotional regulation which are DBT strategies and also work on self-esteem. Reframed how she works on spirituality said that it could be more of a support for her..   Suicidal/Homicidal: No  Therapist Response: Reviewed codependent type behaviors and discussed that patient needs to work on building self-esteem and her own recognition of value to work on codependent behaviors. Discussed that uncaring and shaming attitudes of parents helped in development of unhealthy interpersonal schemas and the need to change schemas.  Helped patient in developing constructive attitude of symptoms as working on skills to learn emotional regulationand and need to learn healthier relationship skills. Discussed recent relationship and breakup and validated patient on getting her needs met in a relationship. Explored developmental history to identify parenting issues as origin of symptoms and explained that therapeutic work will help her and being a better parent to herself. Validated patient on efforts to work on spirituality and uses reframing to help her use this as a support. Encouraged patient to not be so self-critical. Plan: Return again in 1 weeks.2. Patient will read handout on what are interpersonal schemas. 3. Patient reviewed Clare Gandy talk by Ardelle Lesches "The person you need to marry"  Diagnosis: Axis I:  Major depressive disorder, recurrent, moderate    Axis II: No diagnosis    Asser Lucena A, LCSW 11/08/2015

## 2015-11-10 ENCOUNTER — Other Ambulatory Visit (HOSPITAL_COMMUNITY): Payer: Self-pay | Admitting: Psychiatry

## 2015-11-10 MED ORDER — BUSPIRONE HCL 7.5 MG PO TABS: 7.5000 mg | ORAL_TABLET | Freq: Two times a day (BID) | ORAL | 0 refills | Status: DC

## 2015-11-10 NOTE — Telephone Encounter (Signed)
Medication refill- pt request a 90 day supply for Buspar due to the cost of prescription. Please review and advise. Thank you.

## 2015-11-10 NOTE — Telephone Encounter (Signed)
Medication refill- received fax from [employer] requesting refills for Wellbutrin, Buspar and Lamictal. Per Dr. De Nurse, refills were authorize for Wellbutrin 300mg , #90, Buspar 7.5mg , #180 and Lamictal 100mg , #90. Prescriptions were sent to pharmacy. Pt is schedule for a f/u appt on  11/6. Called and informed pt of refill status. Pt verbalizes understanding.

## 2015-11-15 ENCOUNTER — Ambulatory Visit (INDEPENDENT_AMBULATORY_CARE_PROVIDER_SITE_OTHER): Payer: BLUE CROSS/BLUE SHIELD | Admitting: Licensed Clinical Social Worker

## 2015-11-15 DIAGNOSIS — F411 Generalized anxiety disorder: Secondary | ICD-10-CM

## 2015-11-15 DIAGNOSIS — F331 Major depressive disorder, recurrent, moderate: Secondary | ICD-10-CM | POA: Diagnosis not present

## 2015-11-15 NOTE — Progress Notes (Signed)
Dovid Bartko A, LCSW 11/15/2015  Session Time: 1 PM to 1:55 PM  Participation Level: Active  Behavioral Response: CasualAlertDysphoric  Type of Therapy: Individual Therapy  Treatment Goals addressed:  work on challenging unhelpful thought patterns, emotional regulation skills and self-esteem  Interventions: CBT, Solution Focused, Strength-based, Supportive, Reframing and Other: Skills to work on self-esteem  Summary: Michelle Mueller is a 50 y.o. female who presents with not able to get able to get out of worry and thinking about the future. She identified feelings of being trapped and that life was always going to be this way. Explained that she feels that she is never going to be loved and she has to accept this. Shared that every time she moves forward she feels she is slapped down and any positive is short-lived in her life. She recognizes core belief of not being good enough but she feels she cannot get out of pattern of identifying worth by how people react to her and value through productivity. She does okay on her own but wants in a relationship her worries and fears of being rejected have a negative impact on any relationship. She recognizes that her childhood and her mom not showing affection has affected how she feels about herself and how she relates to other. She knows what the problem is but wants help in trying to change this. She endorsed feelings of anger that she feels she is not getting anywhere.    Suicidal/Homicidal: No  Therapist Response: Reviewed patient's current symptoms and identified anger as a current emotions. Explored triggers of anger and identified distortions of  thoughts such as fortune telling, over generalizing, black and white thinking and emotional reasoning. Challenged patient on distortions and encouraged her to rationally refute in order to come up with a more realistic perspective. Identified trauma experiences and trauma work as helpful in order to patient  to organize the memories, understand the impact trauma has had on your feelings and relationships, identify negative schemas and also allows you to put the past in its place. Explained that this allows her to explore schemas and which will help her recognize when she is using them and help her disengage from them. Challenged patient on how she has developed sense of self defined by her productivity and people's perception of her. Discussed the focus of treatment will be on developing healthier self-concept. Discussed strategies she can use to work on self-esteem include positive affirmations, self-care, and unconditional self-worth.   Plan: Return again in 1 weeks.2. Will read handout on unhelpful thinking styles and build skills and challenging cognitive distortions.3. Read handout on narrative storytelling to give her context and working on trauma work.3. Will begin to implement steps to work on self-esteem.4. Patient will work on challenging an effective interpersonal schemas and work on developing alternative schemas  Diagnosis: Axis I: Major depressive disorder, recurrent, moderate, Generalized Anxiety Disorder    Axis II: No diagnosis    Tad Fancher A, LCSW 11/15/2015

## 2015-11-22 DIAGNOSIS — Z4789 Encounter for other orthopedic aftercare: Secondary | ICD-10-CM | POA: Diagnosis not present

## 2015-11-22 DIAGNOSIS — G5602 Carpal tunnel syndrome, left upper limb: Secondary | ICD-10-CM | POA: Diagnosis not present

## 2015-11-23 ENCOUNTER — Ambulatory Visit (INDEPENDENT_AMBULATORY_CARE_PROVIDER_SITE_OTHER): Payer: BLUE CROSS/BLUE SHIELD | Admitting: Licensed Clinical Social Worker

## 2015-11-23 DIAGNOSIS — F331 Major depressive disorder, recurrent, moderate: Secondary | ICD-10-CM

## 2015-11-23 NOTE — Progress Notes (Signed)
   THERAPIST PROGRESS NOTE  Session Time: 2 PM to 2:50 PM  Participation Level: Active  Behavioral Response: CasualAlertEuthymic  Type of Therapy: Individual Therapy  Treatment Goals addressed:  work on challenging unhelpful thought patterns, emotional regulation skills and self-esteem  Interventions: CBT, Solution Focused, Strength-based, Supportive, Reframing and Other: Skills and interpersonal relationships, skills to build self-esteem  Summary: Michelle Mueller is a 50 y.o. female who presents with not doing well. Reviewed the pros and cons of continuing to work on trauma work and she feels that she stuck and is something that she needs to proceed with. She recognizes that her evaluations are impacted by her emotions as being the frame which she evaluates things. Reviewed worksheet "interpersonal schemas worksheet II" to help patient identify unhealthy schemas and to help her develop alternative ones. Patient described being stuck in schemas even though she is rationally able to refute she still believes them. Discussed with patient how reviewing origins of schemas will help her develop insight and help her to be able to better challenge them. Discussed process of narrative storytelling to give patient an overview of process. Identified that she uses avoidance and this is a motivating factor for her to deal with issues she will be stuck. Discussed that she is working through spirituality issues.    Suicidal/Homicidal: No  Therapist Response: Reviewed current symptoms. Work with patient on worksheet "interpersonal schemas worksheet II" to begin trauma work and to develop skills and effective and interpersonal regulation. Identified situation and schemas patient has had of self that have not been helpful to her. This includes that if something happens it is all my fault and that she is not good enough. Help patient develop alternative schemas such as I can't be accountable for others behavior and  I have value. Identified that patient stays and emotional mind so it becomes difficult to challenge schemas. Shared that patient needs to practice alternative schemas as [place] is a muscle that she has to train it will take time for her to believe them. Gave patient an overview of working on trauma as this will help her and challenging her schemas. Help patient process emotions, provided strength-based and supportive interventions. Discussed how avoidance and suppression are not helpful coping strategies. Identified patient's self worth coming from externals and needing to develop accord her sense of value. Also reviewed portal coping strategies for herself as her spirituality and her supports.   Plan: Return again in 2 weeks.2. Patient will review handout titled "basis of unconditional worth"3. Patient practice alternative schemas that help her in feelings about self and alternative expectations of others.   Diagnosis: Axis I:  Major depressive disorder, recurrent, moderate    Axis II: No diagnosis    Mccauley Diehl A, LCSW 11/23/2015

## 2015-11-29 ENCOUNTER — Ambulatory Visit (INDEPENDENT_AMBULATORY_CARE_PROVIDER_SITE_OTHER): Payer: Medicare Other | Admitting: Licensed Clinical Social Worker

## 2015-11-29 DIAGNOSIS — F411 Generalized anxiety disorder: Secondary | ICD-10-CM | POA: Diagnosis not present

## 2015-11-29 DIAGNOSIS — F331 Major depressive disorder, recurrent, moderate: Secondary | ICD-10-CM

## 2015-11-29 NOTE — Progress Notes (Signed)
   THERAPIST PROGRESS NOTE  Session Time: 11 AM to 1155  Participation Level: Active  Behavioral Response: CasualAlertDepressed and Irritable, tearful  Type of Therapy: Individual Therapy   Treatment Goals addressed: work on challenging unhelpful thought patterns, emotional regulation skills and self-esteem  Interventions: CBT, DBT, Solution Focused, Strength-based, Supportive, Reframing and Other: Trauma focused interventions  Summary: Michelle Mueller is a 50 y.o. female who presents tearful day for session. She described to upsetting events related to her kids being mad at her and the person she was dating blaming patient and being critical. Patient feels that in both situations she had done nothing wrong. Therapist reframed and pointed out that both situations the people were not being accountable not to be self-critical over their actions. Encouraged patient to be a good friend to herself and not make it worse by being critical for herself as well. In terms of changing self talk patient feels that she knows that but can't feel emotionally. She identifies a history of being rejected and not feeling good enough. Completed trauma hierarchy and she identifies rejection of her parents as most traumatic experience and that she was not good enough. She identified this created the negative schema that still continues to impact her today. Therapist guided her through deep breathing and reduced grounding skills as she was very emotionally effected when talking about this memory. Therapist pointed out that releasing emotion was in itself part of trauma work so cathartic and therapeutic. Identified distractions that are helpful for her including doing crafts, binge watch shows, and watching her dogs. She believes that being in menopause may be a factor, her hormones are all over the place, she starts crying over nothing. She rated her depression as 8 out of 10 with 10 being the worst.   Suicidal/Homicidal:  Patient describes feeling hopeless at times, but not suicidal without thoughts to hurt herself. She agrees to safety plan discussed including contacting therapist if she needs to.  Therapist Response: Reviewed symptoms and progress. Identified cognitive distortions and negative schemas that continue to impact patient's functioning. Identified all or nothing thinking, negative fortune telling, "I'm not good enough" and basing her value on whether people reject her or not. Continue to encourage patient to change self talk, challenge distortions and replaced with more realistic and positive thoughts. Encouraged patient to practice as we have practice positive thoughts for [place] to begin to believe them. Work with patient on self-esteem and not having worth seronegative people in her life but through her own internal value of herself. Identified emotional regulation as something patient needs to work on as current episodes cause her extreme emotional distress. Practiced deep breathing and other grounding skills to help calm herself down. Began narrative storytelling by creating a trauma hierarchy and relating it with a SUD scale. Practice  supportive and strength-based interventions and help patient process emotions as patient was tearful on and off during session. Plan is to continue to the next step of beginning to talk about past memories at next session.  Plan: Return again in 2 weeks.2. Patient will review Clare Gandy talk videos by Shanda Howells and Durene Romans on emotional regulation and self-esteem.3. Patient will review handout on deep breathing and emotional regulation skills.4. Therapist continued to introduce relaxation skills, self-esteem skills and continue to work on trauma  Diagnosis: Axis I:  Major depressive disorder, recurrent, moderate    Axis II: No diagnosis    Bowman,Mary A, LCSW 11/29/2015

## 2015-12-06 ENCOUNTER — Encounter (HOSPITAL_COMMUNITY): Payer: Self-pay | Admitting: Psychiatry

## 2015-12-06 ENCOUNTER — Ambulatory Visit (INDEPENDENT_AMBULATORY_CARE_PROVIDER_SITE_OTHER): Payer: Medicare Other | Admitting: Psychiatry

## 2015-12-06 VITALS — BP 128/80 | HR 92 | Resp 16 | Ht 65.0 in | Wt 193.0 lb

## 2015-12-06 DIAGNOSIS — G8929 Other chronic pain: Secondary | ICD-10-CM | POA: Diagnosis not present

## 2015-12-06 DIAGNOSIS — Z800 Family history of malignant neoplasm of digestive organs: Secondary | ICD-10-CM

## 2015-12-06 DIAGNOSIS — Z801 Family history of malignant neoplasm of trachea, bronchus and lung: Secondary | ICD-10-CM

## 2015-12-06 DIAGNOSIS — Z808 Family history of malignant neoplasm of other organs or systems: Secondary | ICD-10-CM

## 2015-12-06 DIAGNOSIS — F411 Generalized anxiety disorder: Secondary | ICD-10-CM | POA: Diagnosis not present

## 2015-12-06 DIAGNOSIS — F331 Major depressive disorder, recurrent, moderate: Secondary | ICD-10-CM

## 2015-12-06 DIAGNOSIS — M549 Dorsalgia, unspecified: Secondary | ICD-10-CM | POA: Diagnosis not present

## 2015-12-06 DIAGNOSIS — Z8249 Family history of ischemic heart disease and other diseases of the circulatory system: Secondary | ICD-10-CM

## 2015-12-06 MED ORDER — BUSPIRONE HCL 10 MG PO TABS: 10.0000 mg | ORAL_TABLET | Freq: Two times a day (BID) | ORAL | 0 refills | Status: DC

## 2015-12-06 MED ORDER — LAMOTRIGINE 150 MG PO TABS: 150.0000 mg | ORAL_TABLET | Freq: Every day | ORAL | 0 refills | Status: DC

## 2015-12-06 NOTE — Progress Notes (Signed)
Patient ID: [name], female   DOB: [birth year], 50 y.o.   MRN: [id number]  Psychiatric Outpatient Follow up Visit  Patient Identification: SEQUITA BAIZA MRN:  [id number] Date of Evaluation:  12/06/2015 Referral Source: [name] Chief Complaint:   Chief Complaint    Follow-up     Visit Diagnosis: MDD. GAD . Mood related to Chronic back pain Diagnosis:   Patient Active Problem List   Diagnosis Date Noted  . Chest pain of uncertain etiology 123456 12/21/2014  . Essential hypertension [I10] 12/21/2014  . Myalgia [M79.1] 11/10/2014  . Insomnia [G47.00] 11/10/2014  . UTI (urinary tract infection) [N39.0] 08/04/2013  . Fibromyalgia muscle pain [M79.7] 06/13/2013  . Severe depression ([place]) [F32.2] 06/13/2013  . Diabetes mellitus, type II ([place]) [E11.9] 06/13/2013  . Rheumatoid arthritis ([place]) [M06.9] 03/19/2013  . Fibromyalgia [M79.7] 03/19/2013  . Unspecified hypothyroidism [E03.9] 03/19/2013  . Goiter [E04.9] 03/19/2013  . Obesity (BMI 30-39.9) [E66.9] 03/19/2013  . 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 Family history of pancreatic cancer [Z80.0] 01/18/2011  . Chronic diarrhea [K52.9] 01/18/2011  . ACUTE PHARYNGITIS [J02.9] 10/24/2010   History of Present Illness:   Patient returns for follow up visit. Initially seen by [name] with the following " She initially presented as having  been off of psychiatric medications for approximately 7-8 years.  She had recurrence of depression and feeling of down. Pain exacerbated her depression and tiredness.  Mood disorder; still feels down and emotional. Taking lamictal 100mg .  Her wellbuttrin was increased to 300mg  and it has helped some depression. She is more concern of anxiety and rejection from family members as per history despite writing a text to her sister. Still bad response.  havent talked to parents for last 3 years . Says they bail out when she gets sick, trying to work on therapy now .   Pain  effects her mood. Confusion between carpel tunnel or RA . Working with providers if surgery needed. Her disability has been approved but not yet given which is making her feel overwhelmed financially.  She  does suffer from pain myofascial pain and follows up   States her parents or support system leave her when she is having an exacerbation of arthritis and fibromyalgia and she only has one support that is her kids Aggravating factor; her back pain. Sleep issues. Finances. Severity of depression. 5/10. 10 being no depression.. (worsened)  anxiety : 5/10 . 10 being extreme anxiety; Increased  Medical complexity: back pain and fibromyalgia effects her sleep and depression.  (Hypo) Manic Symptoms:  none Anxiety Symptoms:  Excessive Worry, Psychotic Symptoms:  none PTSD Symptoms: Negative  Past Medical History:  Past Medical History:  Diagnosis Date  . Anxiety   . Arthritis   . Asthma    last attack 3 yrs ago  . Chicken pox   . Depression   . Diabetes mellitus without complication ([place])   . Fibromyalgia   . Goiter   . H/O hiatal hernia   . Hypertension    [name]  . Hypothyroidism   . Ovarian cyst   . PONV (postoperative nausea and vomiting)     Past Surgical History:  Procedure Laterality Date  . ABDOMINAL EXPOSURE  12/18/2011   Procedure: ABDOMINAL EXPOSURE;  Surgeon: [name], MD;  Location: [facility];  Service: Vascular;  Laterality: N/A;  Anterior Expossure for anterior lumbar interbody fuson  . ANTERIOR LUMBAR FUSION  12/18/2011   Procedure: ANTERIOR LUMBAR FUSION  1 LEVEL;  Surgeon: [name], MD;  Location: [facility];  Service: Neurosurgery;  Laterality: N/A;  Lumbar four-five Anterior Lumbar Interbody Fusion with Dr. [name] to do anterior exposure  . CESAREAN SECTION    . CHOLECYSTECTOMY    . HERNIA REPAIR    . LIPOMA EXCISION     L breast  . TONSILLECTOMY    . TUMOR EXCISION     Nerve sheath tumor, R arm   Family History:  Family History   Problem Relation Age of Onset  . Heart disease Father   . Lung cancer Father   . Colon cancer Maternal Aunt   . Pancreatic cancer Maternal Grandmother   . Pancreatic cancer Maternal Grandfather   . Pancreatic cancer Paternal Grandmother   . Cancer Paternal Grandfather   . Sexual abuse Neg Hx    Social History:   Social History   Social History  . Marital status: Divorced    Spouse name: N/A  . Number of children: N/A  . Years of education: N/A   Social History Main Topics  . Smoking status: Never Smoker  . Smokeless tobacco: Never Used  . Alcohol use No  . Drug use: No  . Sexual activity: No   Other Topics Concern  . None   Social History Narrative  . None    Musculoskeletal: Strength & Muscle Tone: within normal limits Gait & Station: normal Patient leans: N/A  Psychiatric Specialty Exam: Aniqa is a 50 year old DWF who is referred by her PCP for treatment of long standing depression.  She lives in [place] with her 54 year old son.  She has an older son Thurmond Butts who is 66. She is very close to her sons, but not her birth family.  She is on disability due to multiple medical problems for the past 3 years.  She was initially treated for depression at age 30 upon her first hospitalization for a suicide attempt by cutting her wrists. Her second hospitalization was in '92/'93 at [facility] in [place] at age 14 for suicidal ideation with husband's gun in her hand. She has had 3 other admissions as well with a third suicide attempt from an intentional overdose of Tegratol in '97/'98, in [place]. Her last admission was in [place] at [facility] in 2005, for depression with an accidental overdose of Ambien.      Review of Systems  Constitutional: Negative for fever.  Cardiovascular: Negative for palpitations.  Gastrointestinal: Negative for nausea.  Musculoskeletal: Positive for back pain.  Neurological: Negative for tremors.   Psychiatric/Behavioral: Positive for depression. Negative for substance abuse and suicidal ideas. The patient is nervous/anxious. The patient does not have insomnia.     Blood pressure 128/80, pulse 92, resp. rate 16, height 5\' 5"  (1.651 m), weight 193 lb (87.5 kg), SpO2 96 %.Body mass index is 32.12 kg/m.  General Appearance: Well Groomed  Eye Contact:  Good  Speech:  Clear and Coherent  Volume:  Normal  Mood:  Anxious and dysthymic  Affect:  depressed  Thought Process:  Coherent, Goal Directed, Intact, Linear and Logical  Orientation:  Full (Time, Place, and Person)  Thought Content:  WDL  Suicidal Thoughts:  No  Homicidal Thoughts:  No  Memory:  Immediate;   Good Recent;   Good Remote;   Good  Judgement:  Good  Insight:  Present  Psychomotor Activity:  Decreased  Concentration:  Fair  Recall:  Good  Fund of Knowledge:Good  Language: Good  Akathisia:  No  Handed:  Left  AIMS (if indicated):    Assets:  Communication Skills Desire for Improvement Financial Resources/Insurance Housing Resilience Transportation  ADL's:  Intact  Cognition: WNL  Sleep:  poor   Is the patient at risk to self?  No. Has the patient been a risk to self in the past 6 months?  No. Has the patient been a risk to self within the distant past?  Yes.   Is the patient a risk to others?  No.  Allergies:   Allergies  Allergen Reactions  . Erythromycin Other (See Comments)    arrhythmia  . Zocor  [Simvastatin-High Dose] Rash  . Doxycycline Other (See Comments)    Decreased BP and caused dizziness per patient   Current Medications: Current Outpatient Prescriptions  Medication Sig Dispense Refill  . atorvastatin (LIPITOR) 10 MG tablet Take 1 tablet (10 mg total) by mouth daily. 90 tablet 1  . buPROPion (WELLBUTRIN XL) 300 MG 24 hr tablet TAKE 1 TABLET BY MOUTH EVERY DAY 90 tablet 0  . busPIRone (BUSPAR) 10 MG tablet Take 1 tablet (10 mg total) by mouth 2 (two) times daily. 60 tablet 0  .  Calcium-Magnesium-Vitamin D (CALCIUM MAGNESIUM PO) Take 1 tablet by mouth daily.    . cetirizine (ZYRTEC) 5 MG tablet Take 5 mg by mouth daily.    . Cholecalciferol (VITAMIN D) 2000 UNITS CAPS Take 1 capsule by mouth daily.    . cyclobenzaprine (FLEXERIL) 5 MG tablet   1  . gabapentin (NEURONTIN) 300 MG capsule Take 3 capsules by mouth at bedtime.     [name] glucose blood (ONE TOUCH ULTRA TEST) test strip Test blood sugar once daily. Dx code: 250.00 100 each 12  . hydroxychloroquine (PLAQUENIL) 200 MG tablet Take 400 mg by mouth daily.   2  . lamoTRIgine (LAMICTAL) 100 MG tablet TAKE 1 TABLET(100 MG) BY MOUTH EVERY DAY 90 tablet 0  . lamoTRIgine (LAMICTAL) 150 MG tablet Take 1 tablet (150 mg total) by mouth daily. Take one a day 30 tablet 0  . levonorgestrel (MIRENA) 20 MCG/24HR IUD 1 each by Intrauterine route once.    [name] levothyroxine (SYNTHROID, LEVOTHROID) 50 MCG tablet Take 1 tablet (50 mcg total) by mouth daily. 90 tablet 1  . lisinopril (PRINIVIL,ZESTRIL) 5 MG tablet Take 1 tablet (5 mg total) by mouth daily. 90 tablet 1  . metFORMIN (GLUCOPHAGE-XR) 500 MG 24 hr tablet Take 1 tablet (500 mg total) by mouth every evening. 90 tablet 1  . Multiple Vitamin (MULTIVITAMIN WITH MINERALS) TABS Take 1 tablet by mouth daily.    . naproxen sodium (ANAPROX) 220 MG tablet Take 220 mg by mouth 2 (two) times daily with a meal.    . ONETOUCH DELICA LANCETS 99991111 MISC Test blood sugar once daily. Dx code: 250.00 100 each 12  . OVER THE COUNTER MEDICATION Vitamin B-12 3000 mcg 1 table daily.    [name] oxyCODONE 10 MG TABS Take 1 tablet (10 mg total) by mouth every 6 (six) hours as needed (for pain). 60 tablet 0  . Phenylephrine-Acetaminophen (TYLENOL SINUS CONGESTION/PAIN PO) Take 2 tablets by mouth daily as needed (for sinus congestion).     . Vitamin D, Ergocalciferol, (DRISDOL) 50000 units CAPS capsule Take 1 capsule (50,000 Units total) by mouth every 7 (seven) days. 12 capsule 1   No current facility-administered  medications for this visit.      Treatment Plan Summary: Medication management  Depression:  Continue  wellbutrin 300mg   For depression. Increase lamictal to 150mg   Recommend to continue therapy for her stresses and how to deal with family members and feeling of rejection.  She is to follow-up with pain management for her pain and any change in meds needed.  No clear manic symptoms in past.  GAD: She is on gabapentin for Pain and fibromyalgia .  Increase buspar 10mg  bid  Limit use of narcotics for pain and follow closely with pain management clinic and recommendations.   Patient will go to nearest ED if symptoms increase or worsen. There is no concern for safety at this time. More then 50% time spent in counseling and coordination of care including patient education. Follow up in 1 month or earlier if needed. Time spent: 25 min.  Merian Capron, MD  1:31 PM 12/06/2015

## 2015-12-07 DIAGNOSIS — M0600 Rheumatoid arthritis without rheumatoid factor, unspecified site: Secondary | ICD-10-CM | POA: Diagnosis not present

## 2015-12-07 DIAGNOSIS — M545 Low back pain: Secondary | ICD-10-CM | POA: Diagnosis not present

## 2015-12-09 ENCOUNTER — Other Ambulatory Visit: Payer: Self-pay | Admitting: Family Medicine

## 2015-12-09 ENCOUNTER — Other Ambulatory Visit: Payer: Self-pay | Admitting: Anesthesiology

## 2015-12-09 ENCOUNTER — Encounter: Payer: Self-pay | Admitting: Family Medicine

## 2015-12-09 DIAGNOSIS — Z1231 Encounter for screening mammogram for malignant neoplasm of breast: Secondary | ICD-10-CM

## 2015-12-09 DIAGNOSIS — M5440 Lumbago with sciatica, unspecified side: Secondary | ICD-10-CM

## 2015-12-09 NOTE — Telephone Encounter (Signed)
Please advise 

## 2015-12-09 NOTE — Telephone Encounter (Signed)
Ok to order US soft tissue neck   Dx thyroid nodule

## 2015-12-13 ENCOUNTER — Ambulatory Visit (HOSPITAL_BASED_OUTPATIENT_CLINIC_OR_DEPARTMENT_OTHER)
Admission: RE | Admit: 2015-12-13 | Discharge: 2015-12-13 | Disposition: A | Discharge status: Home / Self Care | Payer: Medicare Other | Source: Ambulatory Visit | Attending: Family Medicine | Admitting: Family Medicine

## 2015-12-13 DIAGNOSIS — Z1231 Encounter for screening mammogram for malignant neoplasm of breast: Secondary | ICD-10-CM | POA: Diagnosis not present

## 2015-12-14 ENCOUNTER — Other Ambulatory Visit: Payer: Self-pay | Admitting: Family Medicine

## 2015-12-14 ENCOUNTER — Ambulatory Visit (INDEPENDENT_AMBULATORY_CARE_PROVIDER_SITE_OTHER): Payer: Medicare Other | Admitting: Licensed Clinical Social Worker

## 2015-12-14 DIAGNOSIS — F331 Major depressive disorder, recurrent, moderate: Secondary | ICD-10-CM | POA: Diagnosis not present

## 2015-12-14 DIAGNOSIS — E041 Nontoxic single thyroid nodule: Secondary | ICD-10-CM

## 2015-12-14 NOTE — Progress Notes (Signed)
   THERAPIST PROGRESS NOTE  Session Time: 1 PM to 1:55 PM  Participation Level: Active  Behavioral Response: CasualAlertDysphoric  Type of Therapy: Individual Therapy  Treatment Goals addressed:  work on challenging unhelpful thought patterns, emotional regulation skills and self-esteem  Interventions: CBT, Solution Focused, Strength-based, Reframing and Other: skills to work on self-esteem and effective interpersonal skills  Summary: Michelle Mueller is a 50 y.o. female who presents with relating that she is "a mess", depressed, lonely and not a good place. She is not doing anything and and doesn't feel like doing anything. People ask her to do things but she says no and then they stop asking her. She describes being disconnected from the world that is active and working so she doesn't feel she has a lot to offer. She describes no point in investing herself in relationships, she doesn't understand the point of opening up as bad things happen over and over. She is distancing herself from relationships including her kids because she still hurt by how they treated her. She relates that she she is physically and emotionally fighting against depression with conditions that worsen symptoms. She she related these include menopause,  Vitamin D deficiency, RA, menopause, and taking Oxycodone. She shared her feeling that she can't count on anyone and has to accept that she is going to be on her own. She shared loneliness is bad and she feels good about herself and has value until she is in a relationship. Shared with therapist intense dreams that of upset her. Recognizes the need to focus on herself, she is not stable enough to work on trauma and agrees this week to engage and activities she enjoys and that help her to experience her value   Suicidal/Homicidal: No  Therapist Response: provided strength based and supportive interventions to address patient's depressive symptoms. Challenged patient on  perspective to help her recognize distortions related to depression. Identified issues surfaced for patient related to relationships and then basing her value on what the other person thinks of her. Identified that therapeutic focus needs to be for patient to focus work on herself that will help her and recognizing her own value. Worked on reframing so that patient starts start thinking in different way about the relationship and asking herself how she feels around the person and what value they have to her. Discussed needing to work on changing and practicing schemas that will help her when engaging in interpersonal relationships. Discussed as well healthy strategies for patient to include taking her time and getting to know a person to identify if this is a person she wants to invest time in. Discussed with patient need for her to develop goals for better management of symptoms before starting trauma work. Educated patient on dreams and that symbols are the language of dreams and help to uncover unconscious.   Plan: Return again in 1 weeks.2.Patient gain insight to work on skills to build self-esteem and emotional regulation skills that will help improve symptoms  Diagnosis: Axis I:  Major depressive disorder, recurrent, moderate    Axis II: No diagnosis    Bowman,Mary A, LCSW 12/14/2015

## 2015-12-16 ENCOUNTER — Ambulatory Visit (HOSPITAL_BASED_OUTPATIENT_CLINIC_OR_DEPARTMENT_OTHER): Payer: BLUE CROSS/BLUE SHIELD

## 2015-12-16 ENCOUNTER — Other Ambulatory Visit: Payer: Self-pay | Admitting: Family Medicine

## 2015-12-16 DIAGNOSIS — R928 Other abnormal and inconclusive findings on diagnostic imaging of breast: Secondary | ICD-10-CM

## 2015-12-17 ENCOUNTER — Other Ambulatory Visit: Payer: Self-pay | Admitting: Family Medicine

## 2015-12-17 ENCOUNTER — Telehealth: Payer: Self-pay | Admitting: Family Medicine

## 2015-12-17 ENCOUNTER — Encounter: Payer: Self-pay | Admitting: Family Medicine

## 2015-12-17 ENCOUNTER — Other Ambulatory Visit: Payer: Self-pay

## 2015-12-17 DIAGNOSIS — R928 Other abnormal and inconclusive findings on diagnostic imaging of breast: Secondary | ICD-10-CM

## 2015-12-17 NOTE — Telephone Encounter (Signed)
Relation to [id number] Call back number: [phone]   Reason for call:   Patient has a scheduled imaging appointment with the breast center on Monday 12/20/15 at 10:50am requesting diagnostic mamo and ultrasound orders, please advise

## 2015-12-17 NOTE — Telephone Encounter (Signed)
Images have been ordered. Awaiting provider to sign paper copy of order.

## 2015-12-17 NOTE — Telephone Encounter (Signed)
Orders signed and faxed back to the [facility].  Fax confirmation received.

## 2015-12-20 ENCOUNTER — Ambulatory Visit
Admission: RE | Admit: 2015-12-20 | Discharge: 2015-12-20 | Disposition: A | Discharge status: Home / Self Care | Payer: BLUE CROSS/BLUE SHIELD | Source: Ambulatory Visit | Attending: Family Medicine | Admitting: Family Medicine

## 2015-12-20 ENCOUNTER — Other Ambulatory Visit: Payer: Self-pay | Admitting: Family Medicine

## 2015-12-20 ENCOUNTER — Ambulatory Visit
Admission: RE | Admit: 2015-12-20 | Discharge: 2015-12-20 | Disposition: A | Discharge status: Home / Self Care | Payer: BLUE CROSS/BLUE SHIELD | Source: Ambulatory Visit | Attending: Anesthesiology | Admitting: Anesthesiology

## 2015-12-20 ENCOUNTER — Ambulatory Visit (HOSPITAL_BASED_OUTPATIENT_CLINIC_OR_DEPARTMENT_OTHER)
Admission: RE | Admit: 2015-12-20 | Discharge: 2015-12-20 | Disposition: A | Discharge status: Home / Self Care | Payer: BLUE CROSS/BLUE SHIELD | Source: Ambulatory Visit | Attending: Family Medicine | Admitting: Family Medicine

## 2015-12-20 DIAGNOSIS — N6320 Unspecified lump in the left breast, unspecified quadrant: Secondary | ICD-10-CM

## 2015-12-20 DIAGNOSIS — M5440 Lumbago with sciatica, unspecified side: Secondary | ICD-10-CM

## 2015-12-20 DIAGNOSIS — N6489 Other specified disorders of breast: Secondary | ICD-10-CM | POA: Diagnosis not present

## 2015-12-20 DIAGNOSIS — E041 Nontoxic single thyroid nodule: Secondary | ICD-10-CM

## 2015-12-20 DIAGNOSIS — R928 Other abnormal and inconclusive findings on diagnostic imaging of breast: Secondary | ICD-10-CM

## 2015-12-20 DIAGNOSIS — E042 Nontoxic multinodular goiter: Secondary | ICD-10-CM | POA: Diagnosis not present

## 2015-12-20 DIAGNOSIS — M5126 Other intervertebral disc displacement, lumbar region: Secondary | ICD-10-CM | POA: Diagnosis not present

## 2015-12-21 ENCOUNTER — Other Ambulatory Visit: Payer: Self-pay

## 2015-12-21 ENCOUNTER — Ambulatory Visit (INDEPENDENT_AMBULATORY_CARE_PROVIDER_SITE_OTHER): Payer: Medicare Other | Admitting: Licensed Clinical Social Worker

## 2015-12-21 ENCOUNTER — Other Ambulatory Visit: Payer: Self-pay | Admitting: Family Medicine

## 2015-12-21 DIAGNOSIS — F331 Major depressive disorder, recurrent, moderate: Secondary | ICD-10-CM

## 2015-12-21 DIAGNOSIS — N6320 Unspecified lump in the left breast, unspecified quadrant: Secondary | ICD-10-CM

## 2015-12-21 DIAGNOSIS — F411 Generalized anxiety disorder: Secondary | ICD-10-CM | POA: Diagnosis not present

## 2015-12-21 NOTE — Progress Notes (Signed)
   THERAPIST PROGRESS NOTE  Session Time: 11 AM to 11:55 AM  Participation Level: Active  Behavioral Response: CasualAlertDysphoric, Euthymic and tearful at times  Type of Therapy: Individual Therapy  Treatment Goals addressed:  work on challenging unhelpful thought patterns, emotional regulation skills and self-esteem  Interventions: CBT, Solution Focused, Strength-based, Supportive and Other: skills to build self-esteem, skills in effective interpersonal relationships  Summary: Michelle Mueller is a 50 y.o. female who presents with having a better week, having to go back for testing and dealing with the medical stressor and that distraction has been helpful for her. She shared ways she's been able to challenge anxious thoughts although she said the situation still provides significant anxiety. Shared that even though she knows in her head that recent person she met had many issues she still has moments where she gets emotional about it. She knows in her head and she can't make in her connection in her heart. She recognizes beliefs that are driving her such as "it is my fault". Shares that she runs into problems once in her relationship and it becomes about them, as the relationship progresses she becomes more demanding and loses herself. Her value is determined by how they are reacting to her, and she doesn't have an internal sense that she is good enough. She relates that issues are worse when not working because she doesn't have anything to distract herself.        Suicidal/Homicidal: No  Therapist Response: Therapist reviewed patient's progress and symptoms. Identified the patient knows in her heart her value but she has to work on feeling it in her heart. Encouraged patient in practicing new interpersonal schemas and self-talk that will help her to internalize belief in self worth. Described how practice can help patient to look at things in a new way. Therapist encouraged patient in  continuing to challenge identify unhealthy thoughts to pull herself out of negative patterns. For example when she finds herself worrying to call herself out on this unhelpful thought. Identified therapeutic work for patient is working on healthier interpersonal schemas so she does not find her value through others. Explained that this involves patient developing a solid foundation and belief that she is "good enough" without conditions for value. Provided supportive and strength based interventions.    Plan: Return again in 1 weeks.2patient challenged unhealthy thought patterns and take positive steps to work on self-esteem and interpersonal skills.  Diagnosis: Axis I: Major depressive disorder, recurrent, moderate    Axis II: No diagnosis    Bowman,Mary A, LCSW 12/21/2015

## 2015-12-22 ENCOUNTER — Ambulatory Visit
Admission: RE | Admit: 2015-12-22 | Discharge: 2015-12-22 | Disposition: A | Discharge status: Home / Self Care | Payer: BLUE CROSS/BLUE SHIELD | Source: Ambulatory Visit | Attending: Family Medicine | Admitting: Family Medicine

## 2015-12-22 ENCOUNTER — Other Ambulatory Visit: Payer: Self-pay

## 2015-12-22 DIAGNOSIS — N6320 Unspecified lump in the left breast, unspecified quadrant: Secondary | ICD-10-CM

## 2015-12-27 ENCOUNTER — Ambulatory Visit (INDEPENDENT_AMBULATORY_CARE_PROVIDER_SITE_OTHER): Payer: Medicare Other | Admitting: Licensed Clinical Social Worker

## 2015-12-27 DIAGNOSIS — F331 Major depressive disorder, recurrent, moderate: Secondary | ICD-10-CM

## 2015-12-27 NOTE — Progress Notes (Signed)
   THERAPIST PROGRESS NOTE  Session Time: [id number] to 12 PM  Participation Level: Active  Behavioral Response: CasualAlertEuthymic and tearful at times  Type of Therapy: Individual Therapy  Treatment Goals addressed: work on challenging unhelpful thought patterns, emotional regulation skills and self-esteem  Interventions: CBT, Solution Focused, Strength-based, Reframing and Other: skills to work on building self-esteem, skills to work on interpersonal effectiveness  Summary: TYNIKA STANGLER is a 50 y.o. female who presents with going on a date since last session. She had wanted to hold off on starting a relationship but sees this as an opportunity to work on herself and how to be in a relationship in a healthier way. Discussed that she is not going to drop everything, she is going to develop other areas of her life, and that her life has to not center on relationship. She is terrified in losing herself in a guy, she realizes that it could be painful and challenging in doing things differently, hard to keep herself healthy and not do things in the way she used to do them. Discussed with patient that she is working on developing a relationship with self so she values her relationship with self as much as she values being in a relationship. Patient relates that being more self-aware of her mistakes, helps her not to keep repeating them and helps her to have more control. Reviewed not allowing someone else to determine her value. Patient finds herself worrying again and realizes this is an old pattern. Discussed with therapist that worrying does not help, makes it worse and ultimately it isn't going to change outcome of what will happen. Discussed an helpful approach is to tell herself that dating in this relationship is practice.  Suicidal/Homicidal: No  Therapist Response: Reviewed progress and symptoms. Reinforced healthy insights patient is learning to help her to continue to work on herself and at  the same time it will have a positive impact on being healthier in a relationship. Encourage patient in insight that her value comes internally and not giving this up for another person to decide. Discussed how enjoying moments in her life are ways she can experience appreciation of self. Discussed how worrying is not helpful and not to give in to unhelpful thought patterns. Provided positive feedback in patient's approach to  approaching new relationship differently than the past and paying attention and challenging unhelpful interpersonal patterns.      Plan: Return again in 1 weeks.2.patient continue to work and apply skills to build self-esteem.3. Patient continued to work on Dance movement psychotherapist for interpersonal relationships.  Diagnosis: Axis I: Major depressive disorder, recurrent, moderate    Axis II: No diagnosis    Bowman,Mary A, LCSW 12/27/2015

## 2015-12-30 ENCOUNTER — Other Ambulatory Visit: Payer: Self-pay | Admitting: Family Medicine

## 2015-12-30 DIAGNOSIS — I10 Essential (primary) hypertension: Secondary | ICD-10-CM

## 2016-01-03 ENCOUNTER — Ambulatory Visit (INDEPENDENT_AMBULATORY_CARE_PROVIDER_SITE_OTHER): Payer: Medicare Other | Admitting: Licensed Clinical Social Worker

## 2016-01-03 DIAGNOSIS — F331 Major depressive disorder, recurrent, moderate: Secondary | ICD-10-CM | POA: Diagnosis not present

## 2016-01-03 NOTE — Progress Notes (Signed)
   THERAPIST PROGRESS NOTE  Session Time: 2 PM to 2:50 PM  Participation Level: Active  Behavioral Response: CasualAlertDysphoric, Euthymic and tearful at times  Type of Therapy: Individual Therapy  Treatment Goals addressed:  work on challenging unhelpful thought patterns, emotional regulation skills and self-esteem  Interventions: Solution Focused, Strength-based, Supportive and Other: skills to work on self-esteem, healthy interpersonal skills  Summary: Michelle Mueller is a 50 y.o. female who presents falling into the same pattern of getting wrapped up in the relationship quickly. She also realizes this time she made a positive step and being able to recognize this it and discussed how she is going to continue to stay focused on her interests and not drop everything for the relationship. Focusing on her interest will help her to continue to work on self-esteem as well as helping her to interact in healthy ways in the relationship. Explored with therapist how breakup of previous relationship can get her upset even though she realizes it was not a good one. In discussion tied her feelings to deeper underlying issues for her including self-worth and desire for a relationship. Identified.distortion of jumping to conclusions and discussed challenging this by minding herself to be open to other possibilities and replacing the negative thought with something more positive. Discussed how patient needs to continue to work on mindset of being okay with herself and will work on not talking negatively to herself. Identified that doubts are fear driven and therapeutic work involves working through Therapist, occupational. Discussed with therapist that having a disability has made things harder and doing well for 8/9 years and medical issues is caused return to struggle with mental health symptoms. Patient relates although already wrapped up in relationship being more reserved and reviewed that it's good approach to be cautious.  Encouraged patient and self-care to help with self-esteem. Patient took a broader perspective and recognizes that difficulties offer an opportunity to grow and learn and make is better in the long run.   Suicidal/Homicidal: No  Therapist Response: Reviewed progress and symptoms. Even though patient fell into a pattern of getting wrapped up in relationship, she agreed with therapist that she's been able to take a step back and recognize it so she can work on it. She recognizes she needs to challenge thought distortion of jumping to conclusions and replacing the negative was something more positive. Continue to work on to work on strategies to build self-esteem so that patient feels good about herself the way she is and not holding herself to an impossible standard of perfection. Normalized patient's feelings of fear and not being good enough and that fear shows up in different ways for people. Identified the patient is going to work on what she tells herself and challenge negative self talk. Patient is also going to work on self-care and focusing on her own interests as helping build self-esteem and helping with relationship issues. Provided supportive interventions by recognizing that patient's medical issues can exacerbate her mental health issues. Helped patient process through emotions to manage and to help gain insight. Provided strength based interventions. Plan: Return again in 1 week.2.Patient continue to work on self-esteem issues.3. Patient work on Fish farm manager.  Diagnosis: Axis I:  Major depressive disorder, recurrent, moderate    Axis II: No diagnosis    Bowman,Mary A, LCSW 01/03/2016

## 2016-01-04 ENCOUNTER — Ambulatory Visit (INDEPENDENT_AMBULATORY_CARE_PROVIDER_SITE_OTHER): Payer: Medicare Other | Admitting: Psychiatry

## 2016-01-04 ENCOUNTER — Encounter (HOSPITAL_COMMUNITY): Payer: Self-pay | Admitting: Psychiatry

## 2016-01-04 DIAGNOSIS — G8929 Other chronic pain: Secondary | ICD-10-CM

## 2016-01-04 DIAGNOSIS — Z79891 Long term (current) use of opiate analgesic: Secondary | ICD-10-CM

## 2016-01-04 DIAGNOSIS — F331 Major depressive disorder, recurrent, moderate: Secondary | ICD-10-CM

## 2016-01-04 DIAGNOSIS — F411 Generalized anxiety disorder: Secondary | ICD-10-CM | POA: Diagnosis not present

## 2016-01-04 DIAGNOSIS — Z79899 Other long term (current) drug therapy: Secondary | ICD-10-CM | POA: Diagnosis not present

## 2016-01-04 DIAGNOSIS — Z888 Allergy status to other drugs, medicaments and biological substances status: Secondary | ICD-10-CM

## 2016-01-04 DIAGNOSIS — M549 Dorsalgia, unspecified: Secondary | ICD-10-CM

## 2016-01-04 DIAGNOSIS — Z8249 Family history of ischemic heart disease and other diseases of the circulatory system: Secondary | ICD-10-CM

## 2016-01-04 DIAGNOSIS — Z801 Family history of malignant neoplasm of trachea, bronchus and lung: Secondary | ICD-10-CM

## 2016-01-04 DIAGNOSIS — Z9889 Other specified postprocedural states: Secondary | ICD-10-CM

## 2016-01-04 DIAGNOSIS — Z800 Family history of malignant neoplasm of digestive organs: Secondary | ICD-10-CM

## 2016-01-04 DIAGNOSIS — Z0100 Encounter for examination of eyes and vision without abnormal findings: Secondary | ICD-10-CM | POA: Diagnosis not present

## 2016-01-04 MED ORDER — BUPROPION HCL ER (XL) 300 MG PO TB24: 300.0000 mg | ORAL_TABLET | Freq: Every day | ORAL | 0 refills | Status: DC

## 2016-01-04 MED ORDER — LAMOTRIGINE 150 MG PO TABS: 150.0000 mg | ORAL_TABLET | Freq: Every day | ORAL | 0 refills | Status: DC

## 2016-01-04 NOTE — Progress Notes (Signed)
Patient ID: Michelle Mueller, female   DOB: May 08, 1965, 50 y.o.   MRN: [id number]  Psychiatric Outpatient Follow up Visit  Patient Identification: Michelle Mueller MRN:  [id number] Date of Evaluation:  01/04/2016 Referral Source: [name] Chief Complaint:   Chief Complaint    Follow-up     Visit Diagnosis: MDD. GAD . Mood related to Chronic back pain Diagnosis:   Patient Active Problem List   Diagnosis Date Noted  . Chest pain of uncertain etiology [id number] 12/21/2014  . Essential hypertension [I10] 12/21/2014  . Myalgia [M79.1] 11/10/2014  . Insomnia [G47.00] 11/10/2014  . UTI (urinary tract infection) [N39.0] 08/04/2013  . Fibromyalgia muscle pain [M79.7] 06/13/2013  . Severe depression ([place]) [F32.2] 06/13/2013  . Diabetes mellitus, type II ([place]) [E11.9] 06/13/2013  . Rheumatoid arthritis ([place]) [M06.9] 03/19/2013  . Fibromyalgia [M79.7] 03/19/2013  . Unspecified hypothyroidism [E03.9] 03/19/2013  . Goiter [E04.9] 03/19/2013  . Obesity (BMI 30-39.9) [E66.9] 03/19/2013  . 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 Family history of pancreatic cancer [Z80.0] 01/18/2011  . Chronic diarrhea [K52.9] 01/18/2011  . ACUTE PHARYNGITIS [J02.9] 10/24/2010   History of Present Illness:   Patient returns for follow up visit. Initially seen by [name] with the following " She initially presented as having  been off of psychiatric medications for approximately 7-8 years.  She had recurrence of depression and feeling of down. Pain exacerbated her depression and tiredness.  Mood disorder: somewhat better last visit lamictal was increased to 150mg  Also on wellbutrin Anxiety: worries at times excessive . Not as overwhelmed as last time buspar was increased to 10mg  bid has helped . Also therapy is helping Insomnia: not worsened Back pain: on nuerontin. It fluctuates. Still pending RA results or etiology of migrating arthritis.   Modifying factor: her kid Severity of  depression: 6/10. 10 being no depression  associated symptoms: back pain  Past Medical History:  Past Medical History:  Diagnosis Date  . Anxiety   . Arthritis   . Asthma    last attack 3 yrs ago  . Chicken pox   . Depression   . Diabetes mellitus without complication ([place])   . Fibromyalgia   . Goiter   . H/O hiatal hernia   . Hypertension    [name]  . Hypothyroidism   . Ovarian cyst   . PONV (postoperative nausea and vomiting)     Past Surgical History:  Procedure Laterality Date  . ABDOMINAL EXPOSURE  12/18/2011   Procedure: ABDOMINAL EXPOSURE;  Surgeon: [name], MD;  Location: [facility];  Service: Vascular;  Laterality: N/A;  Anterior Expossure for anterior lumbar interbody fuson  . ANTERIOR LUMBAR FUSION  12/18/2011   Procedure: ANTERIOR LUMBAR FUSION 1 LEVEL;  Surgeon: [name], MD;  Location: [facility];  Service: Neurosurgery;  Laterality: N/A;  Lumbar four-five Anterior Lumbar Interbody Fusion with Dr. [name] to do anterior exposure  . CESAREAN SECTION    . CHOLECYSTECTOMY    . HERNIA REPAIR    . LIPOMA EXCISION     L breast  . TONSILLECTOMY    . TUMOR EXCISION     Nerve sheath tumor, R arm   Family History:  Family History  Problem Relation Age of Onset  . Heart disease Father   . Lung cancer Father   . Colon cancer Maternal Aunt   . Pancreatic cancer Maternal Grandmother   . Pancreatic cancer Maternal Grandfather   . Pancreatic cancer Paternal Grandmother   .  Cancer Paternal Grandfather   . Sexual abuse Neg Hx    Social History:   Social History   Social History  . Marital status: Divorced    Spouse name: N/A  . Number of children: N/A  . Years of education: N/A   Social History Main Topics  . Smoking status: Never Smoker  . Smokeless tobacco: Never Used  . Alcohol use No  . Drug use: No  . Sexual activity: No   Other Topics Concern  . None   Social History Narrative  . None    Musculoskeletal: Strength & Muscle Tone:  within normal limits Gait & Station: normal Patient leans: N/A  Psychiatric Specialty Exam: Michelle Mueller is a 50 year old DWF who is referred by her PCP for treatment of long standing depression.  She lives in [place] with her 24 year old son.  She has an older son Thurmond Butts who is 65. She is very close to her sons, but not her birth family.  She is on disability due to multiple medical problems for the past 3 years.  She was initially treated for depression at age 55 upon her first hospitalization for a suicide attempt by cutting her wrists. Her second hospitalization was in '92/'93 at [facility] in [place] at age 32 for suicidal ideation with husband's gun in her hand. She has had 3 other admissions as well with a third suicide attempt from an intentional overdose of Tegratol in '97/'98, in [place]. Her last admission was in [place] at [facility] in 2005, for depression with an accidental overdose of Ambien.      Review of Systems  Constitutional: Negative for fever.  Cardiovascular: Negative for chest pain.  Musculoskeletal: Positive for back pain and myalgias.  Neurological: Negative for tremors.  Psychiatric/Behavioral: Positive for depression. Negative for substance abuse and suicidal ideas. The patient does not have insomnia.     There were no vitals taken for this visit.There is no height or weight on file to calculate BMI.  General Appearance: Well Groomed  Eye Contact:  Good  Speech:  Clear and Coherent  Volume:  Normal  Mood:  Somewhat improved. Still sad at times  Affect:  Fair and congruent  Thought Process:  Coherent, Goal Directed, Intact, Linear and Logical  Orientation:  Full (Time, Place, and Person)  Thought Content:  WDL  Suicidal Thoughts:  No  Homicidal Thoughts:  No  Memory:  Immediate;   Good Recent;   Good Remote;   Good  Judgement:  Good  Insight:  Present  Psychomotor Activity:  Decreased  Concentration:  Fair  Recall:  Good  Fund of  Knowledge:Good  Language: Good  Akathisia:  No  Handed:  Left  AIMS (if indicated):    Assets:  Communication Skills Desire for Improvement Financial Resources/Insurance Housing Resilience Transportation  ADL's:  Intact  Cognition: WNL  Sleep:  poor    Allergies:   Allergies  Allergen Reactions  . Erythromycin Other (See Comments)    arrhythmia  . Zocor  [Simvastatin-High Dose] Rash  . Doxycycline Other (See Comments)    Decreased BP and caused dizziness per patient   Current Medications: Current Outpatient Prescriptions  Medication Sig Dispense Refill  . atorvastatin (LIPITOR) 10 MG tablet Take 1 tablet (10 mg total) by mouth daily. 90 tablet 1  . buPROPion (WELLBUTRIN XL) 300 MG 24 hr tablet Take 1 tablet (300 mg total) by mouth daily. 90 tablet 0  . busPIRone (BUSPAR) 10 MG tablet  Take 1 tablet (10 mg total) by mouth 2 (two) times daily. 60 tablet 0  . Calcium-Magnesium-Vitamin D (CALCIUM MAGNESIUM PO) Take 1 tablet by mouth daily.    . cetirizine (ZYRTEC) 5 MG tablet Take 5 mg by mouth daily.    . Cholecalciferol (VITAMIN D) 2000 UNITS CAPS Take 1 capsule by mouth daily.    . cyclobenzaprine (FLEXERIL) 5 MG tablet   1  . gabapentin (NEURONTIN) 300 MG capsule Take 3 capsules by mouth at bedtime.     [name] glucose blood (ONE TOUCH ULTRA TEST) test strip Test blood sugar once daily. Dx code: 250.00 100 each 12  . hydroxychloroquine (PLAQUENIL) 200 MG tablet Take 400 mg by mouth daily.   2  . lamoTRIgine (LAMICTAL) 150 MG tablet Take 1 tablet (150 mg total) by mouth daily. Take one a day 30 tablet 0  . levonorgestrel (MIRENA) 20 MCG/24HR IUD 1 each by Intrauterine route once.    [name] levothyroxine (SYNTHROID, LEVOTHROID) 50 MCG tablet Take 1 tablet (50 mcg total) by mouth daily. 90 tablet 1  . lisinopril (PRINIVIL,ZESTRIL) 5 MG tablet TAKE 1 TABLET BY MOUTH EVERY DAY 90 tablet 0  . metFORMIN (GLUCOPHAGE-XR) 500 MG 24 hr tablet Take 1 tablet (500 mg total) by mouth every evening. 90  tablet 1  . Multiple Vitamin (MULTIVITAMIN WITH MINERALS) TABS Take 1 tablet by mouth daily.    . naproxen sodium (ANAPROX) 220 MG tablet Take 220 mg by mouth 2 (two) times daily with a meal.    . ONETOUCH DELICA LANCETS 99991111 MISC Test blood sugar once daily. Dx code: 250.00 100 each 12  . OVER THE COUNTER MEDICATION Vitamin B-12 3000 mcg 1 table daily.    [name] oxyCODONE 10 MG TABS Take 1 tablet (10 mg total) by mouth every 6 (six) hours as needed (for pain). 60 tablet 0  . Phenylephrine-Acetaminophen (TYLENOL SINUS CONGESTION/PAIN PO) Take 2 tablets by mouth daily as needed (for sinus congestion).     . Vitamin D, Ergocalciferol, (DRISDOL) 50000 units CAPS capsule Take 1 capsule (50,000 Units total) by mouth every 7 (seven) days. 12 capsule 1   No current facility-administered medications for this visit.      Treatment Plan Summary: Medication management  1. Depression: continue wellbutrin and lamictal. (prescriptions sent) 2. Anxiety: buspar 10mg  bid to continue.  3. Insomnia: reviewed sleep hygiene 4. Back pain: on neurontin. Follow up with providers   Recommend to continue therapy for her stresses and how to deal with family members and feeling of rejection.  Limit use of narcotics for pain and follow closely with pain management clinic and recommendations.   Follow up in 2 months or earlier if needed. Merian Capron, MD  1:34 PM 01/04/2016

## 2016-01-10 ENCOUNTER — Ambulatory Visit (INDEPENDENT_AMBULATORY_CARE_PROVIDER_SITE_OTHER): Payer: Medicare Other | Admitting: Licensed Clinical Social Worker

## 2016-01-10 DIAGNOSIS — F331 Major depressive disorder, recurrent, moderate: Secondary | ICD-10-CM

## 2016-01-10 NOTE — Progress Notes (Addendum)
THERAPIST PROGRESS NOTE  Session Time: 2:10 PM to 3:05 PM  Participation Level: Active  Behavioral Response: CasualAlertDysphoric, Euthymic and tearful at times  Type of Therapy: Individual Therapy  Treatment Goals addressed:  work on challenging unhelpful thought patterns, emotional regulation skills and self-esteem  Interventions: CBT, Solution Focused, Strength-based, Supportive, Reframing and Other: skills to build self-esteem, skills in effective interpersonal relationships  Summary: Michelle Mueller is a 50 y.o. female who presents with explaining that recent relationship ended when she found out he had been cheating incompletely lying. She notices some differences and her reactions that she identifies as positive. Her reaction often has been to choose her over the other woman but in this case she just wanted to know what happened. She became tearful and asked "why this keeps happening?" Discussed a helpful approach for patient is having alternative feelings about self and alternative expectations on others. For example, therapist suggested that saying to herself that he has to prove to her that he is worthy of being in relationship with her and holding off from investing until she knows more about him. Patient feels she was better able to walk away and asks "who wants a liar and a cheater". Her experience has helped her in reacting to the situation as she knows it will involve hurt. Discussed that she her world has become small and she has become self-protective. Explored how this can limit her ability to enjoy her lifeand patient admits that she does not have much to look forward to. Discussed with patient that she has to be part of solution and therapeutic task is to find ways she can enjoy herself in adjusting to life with increased medical problems. Patient shared that she gets tired of tired of dealing with trying to find a decent relationship, of people not treating each other decently.  Acknowledges that she has always wanted to find somebody and discussed with therapist that for now she can work on fulfilling her own "missing piece". Patient shared that she had fun with kids Christmas shopping but then has to suffer consequences of dealing with pain from pushing herself physically.   Suicidal/Homicidal: No  Therapist Response: Therapist reviewed progress symptoms. Identified some of the issues that patient has to still work on including her understanding that she needs not to invest so quickly in her feelings. Worked with patient on ways to reframe to help her and challenging and changing unhelpful interpersonal schemas. Discussed that patient can take steps to work on self-esteem that will also help her in working on  interpersonal relationships. Explored ways patient can make adjustments to more problems with medical issues and pain. Engaged in problem solving to see if patient has found any ways to decrease pain. Explored ways patient can find joy in adjusting increased medical problems. Help patient to emotionally process her feelings in session. At the same time help build insight about how one's expectations about relationships and responses they're likely to receive from others lead them to behave in ways to prepare for the imagine outcomes. Consequently interpersonal schemas are often self-fulfilling. Explained this is why we are working on challenging and changing unhelpful personal schemas. Provided supportive and strength-based interventions  Plan: Return again in 1 weeks.2.Therapist continue to work with patient on developing alternative feelings about self and alternative expectations in relationship to help patient in healthier coping strategies.3.therapist work with patient on gaining insight to steps she can take to create more positive experiences in her life  Diagnosis: Axis  I:  Major depressive disorder, recurrent, moderate    Axis II: No diagnosis    Jerelene Salaam  A, LCSW 01/10/2016

## 2016-01-17 ENCOUNTER — Encounter: Payer: Self-pay | Admitting: Family Medicine

## 2016-01-21 ENCOUNTER — Ambulatory Visit (HOSPITAL_COMMUNITY): Payer: Self-pay | Admitting: Licensed Clinical Social Worker

## 2016-01-26 ENCOUNTER — Other Ambulatory Visit (HOSPITAL_COMMUNITY): Payer: Self-pay | Admitting: *Deleted

## 2016-01-26 MED ORDER — BUSPIRONE HCL 10 MG PO TABS: 10.0000 mg | ORAL_TABLET | Freq: Two times a day (BID) | ORAL | 0 refills | Status: DC

## 2016-01-26 NOTE — Telephone Encounter (Signed)
Medication refill- pt called office requesting a refill for Buspar to be sent to pharmacy. Pt was last seen on 01/04/16. Per Dr. De Nurse, refill is authorize for Buspar 10mg , #60. Prescription was sent to pharmacy. Called and informed pt of refill status. Pt verbalizes understanding. Pt's next apt is schedule on 03/08/16.

## 2016-01-27 ENCOUNTER — Other Ambulatory Visit (HOSPITAL_COMMUNITY): Payer: Self-pay | Admitting: Psychiatry

## 2016-01-28 ENCOUNTER — Ambulatory Visit (INDEPENDENT_AMBULATORY_CARE_PROVIDER_SITE_OTHER): Payer: Medicare Other | Admitting: Licensed Clinical Social Worker

## 2016-01-28 ENCOUNTER — Ambulatory Visit (INDEPENDENT_AMBULATORY_CARE_PROVIDER_SITE_OTHER): Payer: Medicare Other | Admitting: Family Medicine

## 2016-01-28 ENCOUNTER — Encounter: Payer: Self-pay | Admitting: Family Medicine

## 2016-01-28 VITALS — BP 133/76 | HR 70 | Temp 98.8°F | Resp 16 | Ht 65.0 in | Wt 194.4 lb

## 2016-01-28 DIAGNOSIS — E1310 Other specified diabetes mellitus with ketoacidosis without coma: Secondary | ICD-10-CM

## 2016-01-28 DIAGNOSIS — E1165 Type 2 diabetes mellitus with hyperglycemia: Secondary | ICD-10-CM

## 2016-01-28 DIAGNOSIS — R5383 Other fatigue: Secondary | ICD-10-CM

## 2016-01-28 DIAGNOSIS — M791 Myalgia, unspecified site: Secondary | ICD-10-CM

## 2016-01-28 DIAGNOSIS — E559 Vitamin D deficiency, unspecified: Secondary | ICD-10-CM | POA: Diagnosis not present

## 2016-01-28 DIAGNOSIS — R202 Paresthesia of skin: Secondary | ICD-10-CM | POA: Diagnosis not present

## 2016-01-28 DIAGNOSIS — E785 Hyperlipidemia, unspecified: Secondary | ICD-10-CM

## 2016-01-28 DIAGNOSIS — I10 Essential (primary) hypertension: Secondary | ICD-10-CM

## 2016-01-28 DIAGNOSIS — E039 Hypothyroidism, unspecified: Secondary | ICD-10-CM | POA: Diagnosis not present

## 2016-01-28 DIAGNOSIS — F331 Major depressive disorder, recurrent, moderate: Secondary | ICD-10-CM

## 2016-01-28 DIAGNOSIS — IMO0001 Reserved for inherently not codable concepts without codable children: Secondary | ICD-10-CM

## 2016-01-28 DIAGNOSIS — E119 Type 2 diabetes mellitus without complications: Secondary | ICD-10-CM

## 2016-01-28 LAB — COMPREHENSIVE METABOLIC PANEL WITH GFR
ALT: 18 U/L (ref 6–29)
AST: 18 U/L (ref 10–35)
Albumin: 4.4 g/dL (ref 3.6–5.1)
Alkaline Phosphatase: 53 U/L (ref 33–115)
BUN: 15 mg/dL (ref 7–25)
CO2: 24 mmol/L (ref 20–31)
Calcium: 9.4 mg/dL (ref 8.6–10.2)
Chloride: 105 mmol/L (ref 98–110)
Creat: 0.95 mg/dL (ref 0.50–1.10)
Glucose, Bld: 93 mg/dL (ref 65–99)
Potassium: 4.3 mmol/L (ref 3.5–5.3)
Sodium: 140 mmol/L (ref 135–146)
Total Bilirubin: 0.4 mg/dL (ref 0.2–1.2)
Total Protein: 6.7 g/dL (ref 6.1–8.1)

## 2016-01-28 LAB — LIPID PANEL
CHOL/HDL RATIO: 2 ratio (ref <5.0)
CHOLESTEROL: 153 mg/dL (ref <200)
HDL: 77 mg/dL (ref >50)
LDL Cholesterol: 61 mg/dL (ref <100)
TRIGLYCERIDES: 73 mg/dL (ref <150)
VLDL: 15 mg/dL (ref <30)

## 2016-01-28 LAB — TSH: TSH: 1.11 m[IU]/L

## 2016-01-28 MED ORDER — METFORMIN HCL ER 500 MG PO TB24: 500.0000 mg | ORAL_TABLET | Freq: Every evening | ORAL | 1 refills | Status: DC

## 2016-01-28 MED ORDER — LISINOPRIL 5 MG PO TABS: 5.0000 mg | ORAL_TABLET | Freq: Every day | ORAL | 0 refills | Status: DC

## 2016-01-28 MED ORDER — LEVOTHYROXINE SODIUM 50 MCG PO TABS: 50.0000 ug | ORAL_TABLET | Freq: Every day | ORAL | 1 refills | Status: DC

## 2016-01-28 MED ORDER — VITAMIN D (ERGOCALCIFEROL) 1.25 MG (50000 UNIT) PO CAPS: 50000.0000 [IU] | ORAL_CAPSULE | ORAL | 1 refills | Status: DC

## 2016-01-28 MED ORDER — ATORVASTATIN CALCIUM 10 MG PO TABS: 10.0000 mg | ORAL_TABLET | Freq: Every day | ORAL | 1 refills | Status: DC

## 2016-01-28 NOTE — Progress Notes (Signed)
   THERAPIST PROGRESS NOTE  Session Time: 12:05 PM to 1 PM  Participation Level: Active  Behavioral Response: CasualAlertEuthymic and Tearful and dysthymic at times  Type of Therapy: Individual Therapy  Treatment Goals addressed:   work on challenging unhelpful thought patterns, emotional regulation skills and self-esteem  Interventions: Solution Focused, Strength-based, Supportive and Other: Skills to work on self-esteem, skills and interpersonal relationships  Summary: Michelle Mueller is a 50 y.o. female who presents with Christmas being hard and crying everyday. Her younger son was with her husband and described feeling alone. She thought about and ex but through her struggles with this relationship it hit her about her priorities. She has not been functioning on a high level, depressed, life been passing by and she feels as if she has been existing. It hit her to focus on people and animals that care about her and do want her and not people and not people who don't treat her right. She is  wondering if hormones, being sick play a part, and the  depression already there. Described because of being sick she has not been dating. Even though shifting priorities still has issue within relationship that this becomes the most important thing and she'll be desperate to be in a relationship no matter what type of person and she becomes needy at will contribute to sabotaging. Discussed feeling rejected by family at Christmas and not being in relationship that causes her to devaluate herself. Discussed possibility of early attachment issues when father was way at the [employer] and mom dropping her off at grandmother's house for 2 months and being told mom had come back for her kids.  Suicidal/Homicidal: No  Therapist Response: Provided positive feedback about patient's insight to change priorities that will help her value things in her life and appreciate life as it is happening. This insight will help with  depression and discussed how she can apply to her issues with interpersonal relationships. Encouraged patient to develop new set of values where she does not define herself externally by how other people see her but healthier set of values is to develop ones that are internal. Discussed how early issues of attachment impact patient's sense of abandonment when the person is not physically present and the need for patient to work on internalizing the other person so she realizes they're still present. Discussed continuing work on recognizing unhealthy in her personal schemas and working to find alternative feelings about self and alternative expectations to helping creating new patterns.  Plan: Return again in 3 weeks.2. Patient to paper bibliotherapy in reading "The Subtle Art of not giving a F*ck" by [name] 3. Patient continue to gain insight to coping strategies to manage emotions and apply life situations  Diagnosis: Axis I:  Major depressive disorder, recurrent, moderate    Axis II: No diagnosis    Verlean Allport A, LCSW 01/28/2016

## 2016-01-28 NOTE — Progress Notes (Signed)
Pre visit review using our clinic review tool, if applicable. No additional management support is needed unless otherwise documented below in the visit note. 

## 2016-01-28 NOTE — Assessment & Plan Note (Signed)
Check labs con't meds 

## 2016-01-28 NOTE — Patient Instructions (Signed)
Muscle Pain, Adult Muscle pain (myalgia) may be mild or severe. In most cases, the pain lasts only a short time and it goes away without treatment. It is normal to feel some muscle pain after starting a workout program. Muscles that have not been used often will be sore at first. Muscle pain may also be caused by many other things, including:  Overuse or muscle strain, especially if you are not in shape. This is the most common cause of muscle pain.  Injury.  Bruises.  Viruses, such as the flu.  Infectious diseases.  A chronic condition that causes muscle tenderness, fatigue, and headache (fibromyalgia).  A condition, such as lupus, in which the body's disease-fighting system attacks other organs in the body (autoimmune or rheumatologic diseases).  Certain drugs, including ACE inhibitors and statins. To diagnose the cause of your muscle pain, your health care provider will do a physical exam and ask questions about the pain and when it began. If you have not had muscle pain for very long, your health care provider may want to wait before doing much testing. If your muscle pain has lasted a long time, your health care provider may want to run tests right away. In some cases, this may include tests to rule out certain conditions or illnesses. Treatment for muscle pain depends on the cause. Home care is often enough to relieve muscle pain. Your health care provider may also prescribe anti-inflammatory medicine. Follow these instructions at home: Activity   If overuse is causing your muscle pain:  Slow down your activities until the pain goes away.  Do regular, gentle exercises if you are not usually active.  Warm up before exercising. Stretch before and after exercising. This can help lower the risk of muscle pain.  Do not continue working out if the pain is very bad. Bad pain could mean that you have injured a muscle. Managing pain and discomfort    If directed, apply ice to the  sore muscle:  Put ice in a plastic bag.  Place a towel between your skin and the bag.  Leave the ice on for 20 minutes, 2-3 times a day.  You may also alternate between applying ice and applying heat as told by your health care provider. To apply heat, use the heat source that your health care provider recommends, such as a moist heat pack or a heating pad.  Place a towel between your skin and the heat source.  Leave the heat on for 20-30 minutes.  Remove the heat if your skin turns bright red. This is especially important if you are unable to feel pain, heat, or cold. You may have a greater risk of getting burned. Medicines   Take over-the-counter and prescription medicines only as told by your health care provider.  Do not drive or use heavy machinery while taking prescription pain medicine. Contact a health care provider if:  Your muscle pain gets worse and medicines do not help.  You have muscle pain that lasts longer than 3 days.  You have a rash or fever along with muscle pain.  You have muscle pain after a tick bite.  You have muscle pain while working out, even though you are in good physical condition.  You have redness, soreness, or swelling along with muscle pain.  You have muscle pain after starting a new medicine or changing the dose of a medicine. Get help right away if:  You have trouble breathing.  You have trouble swallowing.    You have muscle pain along with a stiff neck, fever, and vomiting.  You have severe muscle weakness or cannot move part of your body. This information is not intended to replace advice given to you by your health care provider. Make sure you discuss any questions you have with your health care provider. Document Released: 12/08/2005 Document Revised: 08/06/2015 Document Reviewed: 06/08/2015 Elsevier Interactive Patient Education  2017 [employer]  

## 2016-01-28 NOTE — Progress Notes (Signed)
Patient ID: Michelle Mueller, female    DOB: 15-Jul-1965  Age: 50 y.o. MRN: [id number]    Subjective:  Subjective  HPI Michelle Mueller presents for f/u vita d-- she really feels no better ---she c/o swelling in ankles and is concerned about lyme disease. No hx of recent tick bite tick  Review of Systems  Constitutional: Positive for fatigue. Negative for activity change, appetite change and unexpected weight change.  Respiratory: Negative for cough and shortness of breath.   Cardiovascular: Negative for chest pain and palpitations.  Musculoskeletal: Positive for joint swelling and myalgias.  Psychiatric/Behavioral: Negative for behavioral problems and dysphoric mood. The patient is not nervous/anxious.     History Past Medical History:  Diagnosis Date  . Anxiety   . Arthritis   . Asthma    last attack 3 yrs ago  . Chicken pox   . Depression   . Diabetes mellitus without complication ([place])   . Fibromyalgia   . Goiter   . H/O hiatal hernia   . Hypertension    dr Michelle Mueller  . Hypothyroidism   . Ovarian cyst   . PONV (postoperative nausea and vomiting)     She has a past surgical history that includes Cholecystectomy; Cesarean section; Tonsillectomy; Hernia repair; Lipoma excision; Tumor excision; Anterior lumbar fusion (12/18/2011); and Abdominal exposure (12/18/2011).   Her family history includes Cancer in her paternal grandfather; Colon cancer in her maternal aunt; Heart disease in her father; Lung cancer in her father; Pancreatic cancer in her maternal grandfather, maternal grandmother, and paternal grandmother.She reports that she has never smoked. She has never used smokeless tobacco. She reports that she does not drink alcohol or use drugs.  Current Outpatient Prescriptions on File Prior to Visit  Medication Sig Dispense Refill  . buPROPion (WELLBUTRIN XL) 300 MG 24 hr tablet Take 1 tablet (300 mg total) by mouth daily. 90 tablet 0  . busPIRone (BUSPAR) 10 MG tablet Take 1  tablet (10 mg total) by mouth 2 (two) times daily. 60 tablet 0  . Calcium-Magnesium-Vitamin D (CALCIUM MAGNESIUM PO) Take 1 tablet by mouth daily.    . cetirizine (ZYRTEC) 5 MG tablet Take 5 mg by mouth daily.    . Cholecalciferol (VITAMIN D) 2000 UNITS CAPS Take 1 capsule by mouth daily.    . cyclobenzaprine (FLEXERIL) 5 MG tablet   1  . gabapentin (NEURONTIN) 300 MG capsule Take 3 capsules by mouth at bedtime.     [name] glucose blood (ONE TOUCH ULTRA TEST) test strip Test blood sugar once daily. Dx code: 250.00 100 each 12  . hydroxychloroquine (PLAQUENIL) 200 MG tablet Take 400 mg by mouth daily.   2  . lamoTRIgine (LAMICTAL) 150 MG tablet Take 1 tablet (150 mg total) by mouth daily. Take one a day 30 tablet 0  . levonorgestrel (MIRENA) 20 MCG/24HR IUD 1 each by Intrauterine route once.    . Multiple Vitamin (MULTIVITAMIN WITH MINERALS) TABS Take 1 tablet by mouth daily.    . naproxen sodium (ANAPROX) 220 MG tablet Take 220 mg by mouth 2 (two) times daily with a meal.    . ONETOUCH DELICA LANCETS 74J MISC Test blood sugar once daily. Dx code: 250.00 100 each 12  . OVER THE COUNTER MEDICATION Vitamin B-12 3000 mcg 1 table daily.    [name] oxyCODONE 10 MG TABS Take 1 tablet (10 mg total) by mouth every 6 (six) hours as needed (for pain). 60 tablet 0  . Phenylephrine-Acetaminophen (TYLENOL SINUS CONGESTION/PAIN  PO) Take 2 tablets by mouth daily as needed (for sinus congestion).      No current facility-administered medications on file prior to visit.      Objective:  Objective  Physical Exam  Constitutional: She is oriented to person, place, and time. She appears well-developed and well-nourished.  HENT:  Head: Normocephalic and atraumatic.  Eyes: Conjunctivae and EOM are normal.  Neck: Normal range of motion. Neck supple. No JVD present. Carotid bruit is not present. No thyromegaly present.  Cardiovascular: Normal rate, regular rhythm and normal heart sounds.   No murmur heard. Pulmonary/Chest:  Effort normal and breath sounds normal. No respiratory distress. She has no wheezes. She has no rales. She exhibits no tenderness.  Musculoskeletal: She exhibits edema.       Feet:  Neurological: She is alert and oriented to person, place, and time.  Psychiatric: She has a normal mood and affect. Her behavior is normal. Judgment and thought content normal.  Nursing note and vitals reviewed.  BP 133/76 (BP Location: Right Arm, Cuff Size: Large)   Pulse 70   Temp 98.8 F (37.1 C) (Oral)   Resp 16   Ht 5' 5"  (1.651 m)   Wt 194 lb 6.4 oz (88.2 kg)   SpO2 99%   BMI 32.35 kg/m  Wt Readings from Last 3 Encounters:  01/28/16 194 lb 6.4 oz (88.2 kg)  12/17/15 193 lb (87.5 kg)  12/06/15 193 lb (87.5 kg)     Lab Results  Component Value Date   WBC 6.9 10/14/2015   HGB 13.1 10/14/2015   HCT 38.5 10/14/2015   PLT 270.0 10/14/2015   GLUCOSE 88 10/14/2015   CHOL 107 (L) 06/23/2015   TRIG 34 06/23/2015   HDL 62 06/23/2015   LDLCALC 38 06/23/2015   ALT 17 10/14/2015   AST 19 10/14/2015   NA 141 10/14/2015   K 4.2 10/14/2015   CL 104 10/14/2015   CREATININE 1.17 10/14/2015   BUN 17 10/14/2015   CO2 29 10/14/2015   TSH 1.11 01/28/2016   HGBA1C 5.5 10/14/2015   MICROALBUR 0.5 08/28/2013    No results found.   Assessment & Plan:  Plan  I have changed Michelle Mueller's lisinopril. I am also having her maintain her Phenylephrine-Acetaminophen (TYLENOL SINUS CONGESTION/PAIN PO), multivitamin with minerals, Vitamin D, Oxycodone HCl, gabapentin, glucose blood, ONETOUCH DELICA LANCETS 60A, OVER THE COUNTER MEDICATION, cetirizine, hydroxychloroquine, cyclobenzaprine, levonorgestrel, naproxen sodium, Calcium-Magnesium-Vitamin D (CALCIUM MAGNESIUM PO), lamoTRIgine, buPROPion, busPIRone, OVER THE COUNTER MEDICATION, atorvastatin, levothyroxine, Vitamin D (Ergocalciferol), and metFORMIN.  Meds ordered this encounter  Medications  . OVER THE COUNTER MEDICATION    Sig: Amberen 2 tablets every  morning  . lisinopril (PRINIVIL,ZESTRIL) 5 MG tablet    Sig: Take 1 tablet (5 mg total) by mouth daily.    Dispense:  90 tablet    Refill:  0  . atorvastatin (LIPITOR) 10 MG tablet    Sig: Take 1 tablet (10 mg total) by mouth daily.    Dispense:  90 tablet    Refill:  1  . levothyroxine (SYNTHROID, LEVOTHROID) 50 MCG tablet    Sig: Take 1 tablet (50 mcg total) by mouth daily.    Dispense:  90 tablet    Refill:  1  . Vitamin D, Ergocalciferol, (DRISDOL) 50000 units CAPS capsule    Sig: Take 1 capsule (50,000 Units total) by mouth every 7 (seven) days.    Dispense:  12 capsule    Refill:  1  . metFORMIN (  GLUCOPHAGE-XR) 500 MG 24 hr tablet    Sig: Take 1 tablet (500 mg total) by mouth every evening.    Dispense:  90 tablet    Refill:  1    Problem List Items Addressed This Visit      Unprioritized   Myalgia - Primary   Relevant Orders   Lyme Aby, Western Blot IgG & IgM w/bands (Completed)   Comp Met (CMET) (Completed)   Hemoglobin A1c (Completed)   Lipid panel (Completed)   TSH (Completed)   B12 (Completed)   Vitamin D (25 hydroxy) (Completed)   Essential hypertension    stable      Relevant Medications   lisinopril (PRINIVIL,ZESTRIL) 5 MG tablet   atorvastatin (LIPITOR) 10 MG tablet   Other Relevant Orders   Comp Met (CMET) (Completed)   Hemoglobin A1c (Completed)   Lipid panel (Completed)   TSH (Completed)   B12 (Completed)   Vitamin D (25 hydroxy) (Completed)    Other Visit Diagnoses    Hyperlipidemia, unspecified hyperlipidemia type       Relevant Medications   lisinopril (PRINIVIL,ZESTRIL) 5 MG tablet   atorvastatin (LIPITOR) 10 MG tablet   Other Relevant Orders   Comp Met (CMET) (Completed)   Hemoglobin A1c (Completed)   Lipid panel (Completed)   TSH (Completed)   B12 (Completed)   Vitamin D (25 hydroxy) (Completed)   Uncontrolled type 2 diabetes mellitus with ketoacidosis without coma, unspecified long term insulin use status (HCC)       Relevant  Medications   lisinopril (PRINIVIL,ZESTRIL) 5 MG tablet   atorvastatin (LIPITOR) 10 MG tablet   metFORMIN (GLUCOPHAGE-XR) 500 MG 24 hr tablet   Other Relevant Orders   Comp Met (CMET) (Completed)   Hemoglobin A1c (Completed)   Lipid panel (Completed)   TSH (Completed)   B12 (Completed)   Vitamin D (25 hydroxy) (Completed)   Hypothyroidism, unspecified type       Relevant Medications   levothyroxine (SYNTHROID, LEVOTHROID) 50 MCG tablet   Other Relevant Orders   Comp Met (CMET) (Completed)   Hemoglobin A1c (Completed)   Lipid panel (Completed)   TSH (Completed)   B12 (Completed)   Vitamin D (25 hydroxy) (Completed)   Vitamin D deficiency       Relevant Medications   Vitamin D, Ergocalciferol, (DRISDOL) 50000 units CAPS capsule   Other Relevant Orders   Comp Met (CMET) (Completed)   Hemoglobin A1c (Completed)   Lipid panel (Completed)   TSH (Completed)   B12 (Completed)   Vitamin D (25 hydroxy) (Completed)   Fatigue, unspecified type       Relevant Orders   Comp Met (CMET) (Completed)   Hemoglobin A1c (Completed)   Lipid panel (Completed)   TSH (Completed)   B12 (Completed)   Vitamin D (25 hydroxy) (Completed)   Paresthesia       Relevant Orders   Comp Met (CMET) (Completed)   Hemoglobin A1c (Completed)   Lipid panel (Completed)   TSH (Completed)   B12 (Completed)   Vitamin D (25 hydroxy) (Completed)   Uncontrolled type 2 diabetes mellitus without complication, without long-term current use of insulin (HCC)       Relevant Medications   lisinopril (PRINIVIL,ZESTRIL) 5 MG tablet   atorvastatin (LIPITOR) 10 MG tablet   metFORMIN (GLUCOPHAGE-XR) 500 MG 24 hr tablet   Controlled type 2 diabetes mellitus without complication, without long-term current use of insulin (HCC)       Relevant Medications   lisinopril (PRINIVIL,ZESTRIL) 5 MG  tablet   atorvastatin (LIPITOR) 10 MG tablet   metFORMIN (GLUCOPHAGE-XR) 500 MG 24 hr tablet      Follow-up: Return in about 6  months (around 07/28/2016) for hypertension, hyperlipidemia, diabetes II.  Ann Held, DO

## 2016-01-28 NOTE — Assessment & Plan Note (Addendum)
stable °

## 2016-01-29 LAB — HEMOGLOBIN A1C
Hgb A1c MFr Bld: 4.9 % (ref <5.7)
MEAN PLASMA GLUCOSE: 94 mg/dL

## 2016-01-29 LAB — VITAMIN B12: Vitamin B-12: 1425 pg/mL — ABNORMAL HIGH (ref 200–1100)

## 2016-01-29 LAB — VITAMIN D 25 HYDROXY (VIT D DEFICIENCY, FRACTURES): Vit D, 25-Hydroxy: 79 ng/mL (ref 30–100)

## 2016-01-30 ENCOUNTER — Other Ambulatory Visit (HOSPITAL_COMMUNITY): Payer: Self-pay | Admitting: Psychiatry

## 2016-02-01 ENCOUNTER — Ambulatory Visit (HOSPITAL_COMMUNITY): Payer: Self-pay | Admitting: Licensed Clinical Social Worker

## 2016-02-01 LAB — LYME ABY, WSTRN BLT IGG & IGM W/BANDS
B BURGDORFERI IGG ABS (IB): NEGATIVE
B burgdorferi IgM Abs (IB): NEGATIVE
LYME DISEASE 23 KD IGG: NONREACTIVE
LYME DISEASE 23 KD IGM: REACTIVE — AB
LYME DISEASE 28 KD IGG: NONREACTIVE
LYME DISEASE 39 KD IGG: NONREACTIVE
LYME DISEASE 41 KD IGG: NONREACTIVE
LYME DISEASE 66 KD IGG: NONREACTIVE
Lyme Disease 18 kD IgG: NONREACTIVE
Lyme Disease 30 kD IgG: NONREACTIVE
Lyme Disease 39 kD IgM: NONREACTIVE
Lyme Disease 41 kD IgM: NONREACTIVE
Lyme Disease 45 kD IgG: NONREACTIVE
Lyme Disease 58 kD IgG: NONREACTIVE
Lyme Disease 93 kD IgG: NONREACTIVE

## 2016-02-02 ENCOUNTER — Encounter: Payer: Self-pay | Admitting: Family Medicine

## 2016-02-08 ENCOUNTER — Ambulatory Visit (HOSPITAL_COMMUNITY): Payer: Self-pay | Admitting: Licensed Clinical Social Worker

## 2016-02-09 DIAGNOSIS — Z1159 Encounter for screening for other viral diseases: Secondary | ICD-10-CM | POA: Diagnosis not present

## 2016-02-09 DIAGNOSIS — M25511 Pain in right shoulder: Secondary | ICD-10-CM | POA: Diagnosis not present

## 2016-02-09 DIAGNOSIS — M25512 Pain in left shoulder: Secondary | ICD-10-CM | POA: Diagnosis not present

## 2016-02-09 DIAGNOSIS — M659 Synovitis and tenosynovitis, unspecified: Secondary | ICD-10-CM | POA: Diagnosis not present

## 2016-02-09 DIAGNOSIS — Z79899 Other long term (current) drug therapy: Secondary | ICD-10-CM | POA: Diagnosis not present

## 2016-02-14 ENCOUNTER — Other Ambulatory Visit: Payer: Self-pay | Admitting: Family Medicine

## 2016-02-14 DIAGNOSIS — IMO0001 Reserved for inherently not codable concepts without codable children: Secondary | ICD-10-CM

## 2016-02-14 DIAGNOSIS — E119 Type 2 diabetes mellitus without complications: Secondary | ICD-10-CM

## 2016-02-14 DIAGNOSIS — E1165 Type 2 diabetes mellitus with hyperglycemia: Principal | ICD-10-CM

## 2016-02-29 NOTE — Telephone Encounter (Signed)
Medication refill- pt called office requesting a refill for Lamictal to be sent to pharmacy. Pt was last seen on 01/04/16. Per Dr. De Nurse, refill is authorize for Lamictal 150mg , #30. Prescription was sent to pharmacy. Called and informed pt of refill status. Pt verbalizes understanding. Pt's next apt is schedule on 03/08/16.

## 2016-02-29 NOTE — Telephone Encounter (Signed)
Medication refill- received fax from [facility] requesting a refill for Buspar. Per Dr. De Nurse, medication order for a 90 day supply is authorize for Buspar 10mg . Prescription was sent to pharmacy. Pt is schedule for a f/u apt on 03/08/16. Called and informed pt of refill status. Pt verbalizes understanding.

## 2016-03-07 DIAGNOSIS — M0600 Rheumatoid arthritis without rheumatoid factor, unspecified site: Secondary | ICD-10-CM | POA: Diagnosis not present

## 2016-03-07 DIAGNOSIS — M545 Low back pain: Secondary | ICD-10-CM | POA: Diagnosis not present

## 2016-03-08 ENCOUNTER — Ambulatory Visit (HOSPITAL_COMMUNITY): Payer: Self-pay | Admitting: Psychiatry

## 2016-03-09 ENCOUNTER — Encounter (HOSPITAL_COMMUNITY): Payer: Self-pay | Admitting: Psychiatry

## 2016-03-09 ENCOUNTER — Ambulatory Visit (INDEPENDENT_AMBULATORY_CARE_PROVIDER_SITE_OTHER): Payer: Medicare Other | Admitting: Psychiatry

## 2016-03-09 VITALS — BP 118/68 | HR 91 | Resp 16 | Ht 65.0 in | Wt 192.0 lb

## 2016-03-09 DIAGNOSIS — M549 Dorsalgia, unspecified: Secondary | ICD-10-CM | POA: Diagnosis not present

## 2016-03-09 DIAGNOSIS — Z9049 Acquired absence of other specified parts of digestive tract: Secondary | ICD-10-CM

## 2016-03-09 DIAGNOSIS — Z888 Allergy status to other drugs, medicaments and biological substances status: Secondary | ICD-10-CM

## 2016-03-09 DIAGNOSIS — F411 Generalized anxiety disorder: Secondary | ICD-10-CM

## 2016-03-09 DIAGNOSIS — Z808 Family history of malignant neoplasm of other organs or systems: Secondary | ICD-10-CM | POA: Diagnosis not present

## 2016-03-09 DIAGNOSIS — Z8249 Family history of ischemic heart disease and other diseases of the circulatory system: Secondary | ICD-10-CM | POA: Diagnosis not present

## 2016-03-09 DIAGNOSIS — Z800 Family history of malignant neoplasm of digestive organs: Secondary | ICD-10-CM

## 2016-03-09 DIAGNOSIS — F331 Major depressive disorder, recurrent, moderate: Secondary | ICD-10-CM | POA: Diagnosis not present

## 2016-03-09 DIAGNOSIS — Z9889 Other specified postprocedural states: Secondary | ICD-10-CM | POA: Diagnosis not present

## 2016-03-09 DIAGNOSIS — Z801 Family history of malignant neoplasm of trachea, bronchus and lung: Secondary | ICD-10-CM | POA: Diagnosis not present

## 2016-03-09 DIAGNOSIS — Z79899 Other long term (current) drug therapy: Secondary | ICD-10-CM | POA: Diagnosis not present

## 2016-03-09 DIAGNOSIS — Z79891 Long term (current) use of opiate analgesic: Secondary | ICD-10-CM

## 2016-03-09 DIAGNOSIS — G8929 Other chronic pain: Secondary | ICD-10-CM | POA: Diagnosis not present

## 2016-03-09 MED ORDER — FLUOXETINE HCL 10 MG PO CAPS: 10.0000 mg | ORAL_CAPSULE | Freq: Every day | ORAL | 0 refills | Status: DC

## 2016-03-09 MED ORDER — BUPROPION HCL ER (XL) 300 MG PO TB24: 300.0000 mg | ORAL_TABLET | Freq: Every day | ORAL | 0 refills | Status: DC

## 2016-03-09 NOTE — Progress Notes (Signed)
Patient ID: [name], female   DOB: October 11, 1965, 51 y.o.   MRN: [id number]  Psychiatric Outpatient Follow up Visit  Patient Identification: Michelle Mueller MRN:  [id number] Date of Evaluation:  03/09/2016 Referral Source: [name] Chief Complaint:   Chief Complaint    Follow-up     Visit Diagnosis: MDD. GAD . Mood related to Chronic back pain Diagnosis:   Patient Active Problem List   Diagnosis Date Noted  . Chest pain of uncertain etiology 123456 12/21/2014  . Essential hypertension [I10] 12/21/2014  . Myalgia [M79.1] 11/10/2014  . Insomnia [G47.00] 11/10/2014  . UTI (urinary tract infection) [N39.0] 08/04/2013  . Fibromyalgia muscle pain [M79.7] 06/13/2013  . Severe depression ([place]) [F32.2] 06/13/2013  . Diabetes mellitus, type II ([place]) [E11.9] 06/13/2013  . Rheumatoid arthritis ([place]) [M06.9] 03/19/2013  . Fibromyalgia [M79.7] 03/19/2013  . Unspecified hypothyroidism [E03.9] 03/19/2013  . Goiter [E04.9] 03/19/2013  . Obesity (BMI 30-39.9) [E66.9] 03/19/2013  . 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 Family history of pancreatic cancer [Z80.0] 01/18/2011  . Chronic diarrhea [K52.9] 01/18/2011  . ACUTE PHARYNGITIS [J02.9] 10/24/2010   History of Present Illness:   Patient returns for follow up visit. Initially seen by [name] with the following " She initially presented as having  been off of psychiatric medications for approximately 7-8 years.  She had recurrence of depression and feeling of down. Pain exacerbated her depression and tiredness.   Patient still remains depressed and says that loneliness and not much to do including the fact that she is having arthritis and medical complexity as up to her depression. She is taking Wellbutrin she still endorses anxiety. BuSpar does not help much with the anxiety. Insomnia is not worsened but overall she feels somewhat frustrated and depressed including crying spells and decreased energy  Back pain: on  nuerontin. It fluctuates. Still pending RA results or etiology of migrating arthritis.   Modifying factor: her kid Severity of depression: 5/10. 10 being no depression  associated symptoms: back pain  Past Medical History:  Past Medical History:  Diagnosis Date  . Anxiety   . Arthritis   . Asthma    last attack 3 yrs ago  . Chicken pox   . Depression   . Diabetes mellitus without complication ([place])   . Fibromyalgia   . Goiter   . H/O hiatal hernia   . Hypertension    [name]  . Hypothyroidism   . Ovarian cyst   . PONV (postoperative nausea and vomiting)     Past Surgical History:  Procedure Laterality Date  . ABDOMINAL EXPOSURE  12/18/2011   Procedure: ABDOMINAL EXPOSURE;  Surgeon: [name], MD;  Location: [facility];  Service: Vascular;  Laterality: N/A;  Anterior Expossure for anterior lumbar interbody fuson  . ANTERIOR LUMBAR FUSION  12/18/2011   Procedure: ANTERIOR LUMBAR FUSION 1 LEVEL;  Surgeon: [name], MD;  Location: [facility];  Service: Neurosurgery;  Laterality: N/A;  Lumbar four-five Anterior Lumbar Interbody Fusion with Dr. [name] to do anterior exposure  . CESAREAN SECTION    . CHOLECYSTECTOMY    . HERNIA REPAIR    . LIPOMA EXCISION     L breast  . TONSILLECTOMY    . TUMOR EXCISION     Nerve sheath tumor, R arm   Family History:  Family History  Problem Relation Age of Onset  . Heart disease Father   . Lung cancer Father   . Colon cancer Maternal  Aunt   . Pancreatic cancer Maternal Grandmother   . Pancreatic cancer Maternal Grandfather   . Pancreatic cancer Paternal Grandmother   . Cancer Paternal Grandfather   . Sexual abuse Neg Hx    Social History:   Social History   Social History  . Marital status: Divorced    Spouse name: N/A  . Number of children: N/A  . Years of education: N/A   Social History Main Topics  . Smoking status: Never Smoker  . Smokeless tobacco: Never Used  . Alcohol use No  . Drug use: No  . Sexual  activity: No   Other Topics Concern  . None   Social History Narrative  . None      Psychiatric Specialty Exam:      Review of Systems  Constitutional: Negative for fever.  Cardiovascular: Negative for palpitations.  Musculoskeletal: Positive for back pain and myalgias.  Neurological: Negative for tremors.  Psychiatric/Behavioral: Positive for depression. Negative for substance abuse and suicidal ideas. The patient does not have insomnia.     Blood pressure 118/68, pulse 91, resp. rate 16, height 5\' 5"  (1.651 m), weight 192 lb (87.1 kg), SpO2 95 %.Body mass index is 31.95 kg/m.  General Appearance: Well Groomed  Eye Contact:  Good  Speech:  Clear and Coherent  Volume:  Normal  Mood:  Dysphoric ,   Affect: constricted  Thought Process:  Coherent, Goal Directed, Intact, Linear and Logical  Orientation:  Full (Time, Place, and Person)  Thought Content:  WDL  Suicidal Thoughts:  No  Homicidal Thoughts:  No  Memory:  Immediate;   Good Recent;   Good Remote;   Good  Judgement:  Good  Insight:  Present  Psychomotor Activity:  Decreased  Concentration:  Fair  Recall:  Good  Fund of Knowledge:Good  Language: Good  Akathisia:  No  Handed:  Left  AIMS (if indicated):    Assets:  Communication Skills Desire for Improvement Financial Resources/Insurance Housing Resilience Transportation  ADL's:  Intact  Cognition: WNL  Sleep:  poor    Allergies:   Allergies  Allergen Reactions  . Erythromycin Other (See Comments)    arrhythmia  . Zocor  [Simvastatin-High Dose] Rash  . Doxycycline Other (See Comments)    Decreased BP and caused dizziness per patient   Current Medications: Current Outpatient Prescriptions  Medication Sig Dispense Refill  . atorvastatin (LIPITOR) 10 MG tablet Take 1 tablet (10 mg total) by mouth daily. 90 tablet 1  . buPROPion (WELLBUTRIN XL) 300 MG 24 hr tablet Take 1 tablet (300 mg total) by mouth daily. 90 tablet 0  . busPIRone (BUSPAR) 10  MG tablet TAKE 1 TABLET(10 MG) BY MOUTH TWICE DAILY 180 tablet 0  . Calcium-Magnesium-Vitamin D (CALCIUM MAGNESIUM PO) Take 1 tablet by mouth daily.    . cetirizine (ZYRTEC) 5 MG tablet Take 5 mg by mouth daily.    . Cholecalciferol (VITAMIN D) 2000 UNITS CAPS Take 1 capsule by mouth daily.    . cyclobenzaprine (FLEXERIL) 5 MG tablet   1  . gabapentin (NEURONTIN) 300 MG capsule Take 3 capsules by mouth at bedtime.     [name] glucose blood (ONE TOUCH ULTRA TEST) test strip Test blood sugar once daily. Dx code: 250.00 100 each 12  . hydroxychloroquine (PLAQUENIL) 200 MG tablet Take 400 mg by mouth daily.   2  . lamoTRIgine (LAMICTAL) 150 MG tablet TAKE 1 TABLET(150 MG) BY MOUTH EVERY DAY 30 tablet 0  . levonorgestrel (MIRENA) 20  MCG/24HR IUD 1 each by Intrauterine route once.    [name] levothyroxine (SYNTHROID, LEVOTHROID) 50 MCG tablet Take 1 tablet (50 mcg total) by mouth daily. 90 tablet 1  . lisinopril (PRINIVIL,ZESTRIL) 5 MG tablet Take 1 tablet (5 mg total) by mouth daily. 90 tablet 0  . metFORMIN (GLUCOPHAGE-XR) 500 MG 24 hr tablet TAKE 1 TABLET BY MOUTH EVERY EVENING 90 tablet 0  . Multiple Vitamin (MULTIVITAMIN WITH MINERALS) TABS Take 1 tablet by mouth daily.    . naproxen sodium (ANAPROX) 220 MG tablet Take 220 mg by mouth 2 (two) times daily with a meal.    . ONETOUCH DELICA LANCETS 99991111 MISC Test blood sugar once daily. Dx code: 250.00 100 each 12  . OVER THE COUNTER MEDICATION Vitamin B-12 3000 mcg 1 table daily.    [name] OVER THE COUNTER MEDICATION Amberen 2 tablets every morning    . oxyCODONE 10 MG TABS Take 1 tablet (10 mg total) by mouth every 6 (six) hours as needed (for pain). 60 tablet 0  . Phenylephrine-Acetaminophen (TYLENOL SINUS CONGESTION/PAIN PO) Take 2 tablets by mouth daily as needed (for sinus congestion).     . Vitamin D, Ergocalciferol, (DRISDOL) 50000 units CAPS capsule Take 1 capsule (50,000 Units total) by mouth every 7 (seven) days. 12 capsule 1  . FLUoxetine (PROZAC) 10 MG  capsule Take 1 capsule (10 mg total) by mouth daily. Increase to 2 in 4 days. 50 capsule 0   No current facility-administered medications for this visit.      Treatment Plan Summary: Medication management  1. Depression:worsened. Continue lamictal and wellbutrin. Will add prozac 10mg  increase to 20mg  in one week. 2. Anxiety: fluctutas. : continue buspar. Add prozac as above 3. Insomnia: reviewed sleep hygiene.   4. Back pain: on neurontin. Follow up with providers   Recommend to continue therapy for her stresses and how to deal with family members and feeling of rejection. Patient need to add more acitivites during the day.  May need to adjust more meds if needed. Will see effect of prozac  Limit use of narcotics for pain and follow closely with pain management clinic and recommendations.   Follow up in 3-4 weeks or earlier if needed Merian Capron, MD  1:59 PM 03/09/2016

## 2016-03-13 DIAGNOSIS — M130 Polyarthritis, unspecified: Secondary | ICD-10-CM | POA: Diagnosis not present

## 2016-03-13 DIAGNOSIS — M0609 Rheumatoid arthritis without rheumatoid factor, multiple sites: Secondary | ICD-10-CM | POA: Diagnosis not present

## 2016-03-13 DIAGNOSIS — Z23 Encounter for immunization: Secondary | ICD-10-CM | POA: Diagnosis not present

## 2016-04-04 ENCOUNTER — Encounter (HOSPITAL_COMMUNITY): Payer: Self-pay | Admitting: Psychiatry

## 2016-04-04 ENCOUNTER — Ambulatory Visit (INDEPENDENT_AMBULATORY_CARE_PROVIDER_SITE_OTHER): Payer: Medicare Other | Admitting: Psychiatry

## 2016-04-04 DIAGNOSIS — F411 Generalized anxiety disorder: Secondary | ICD-10-CM

## 2016-04-04 DIAGNOSIS — Z888 Allergy status to other drugs, medicaments and biological substances status: Secondary | ICD-10-CM | POA: Diagnosis not present

## 2016-04-04 DIAGNOSIS — Z79899 Other long term (current) drug therapy: Secondary | ICD-10-CM

## 2016-04-04 DIAGNOSIS — G4700 Insomnia, unspecified: Secondary | ICD-10-CM

## 2016-04-04 DIAGNOSIS — M549 Dorsalgia, unspecified: Secondary | ICD-10-CM | POA: Diagnosis not present

## 2016-04-04 DIAGNOSIS — F331 Major depressive disorder, recurrent, moderate: Secondary | ICD-10-CM | POA: Diagnosis not present

## 2016-04-04 DIAGNOSIS — G8929 Other chronic pain: Secondary | ICD-10-CM | POA: Diagnosis not present

## 2016-04-04 DIAGNOSIS — M5489 Other dorsalgia: Secondary | ICD-10-CM

## 2016-04-04 MED ORDER — FLUOXETINE HCL 20 MG PO TABS: 20.0000 mg | ORAL_TABLET | Freq: Every day | ORAL | 0 refills | Status: DC

## 2016-04-04 NOTE — Progress Notes (Signed)
Patient ID: [name], female   DOB: 1965-03-15, 51 y.o.   MRN: [id number]  Psychiatric Outpatient Follow up Visit  Patient Identification: Michelle Mueller MRN:  [id number] Date of Evaluation:  04/04/2016 Referral Source: [name] Chief Complaint:   Chief Complaint    Follow-up     Visit Diagnosis: MDD. GAD . Mood related to Chronic back pain Diagnosis:   Patient Active Problem List   Diagnosis Date Noted  . Chest pain of uncertain etiology 123456 12/21/2014  . Essential hypertension [I10] 12/21/2014  . Myalgia [M79.1] 11/10/2014  . Insomnia [G47.00] 11/10/2014  . UTI (urinary tract infection) [N39.0] 08/04/2013  . Fibromyalgia muscle pain [M79.7] 06/13/2013  . Severe depression ([place]) [F32.2] 06/13/2013  . Diabetes mellitus, type II ([place]) [E11.9] 06/13/2013  . Rheumatoid arthritis ([place]) [M06.9] 03/19/2013  . Fibromyalgia [M79.7] 03/19/2013  . Unspecified hypothyroidism [E03.9] 03/19/2013  . Goiter [E04.9] 03/19/2013  . Obesity (BMI 30-39.9) [E66.9] 03/19/2013  . 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 Family history of pancreatic cancer [Z80.0] 01/18/2011  . Chronic diarrhea [K52.9] 01/18/2011  . ACUTE PHARYNGITIS [J02.9] 10/24/2010   History of Present Illness:   Patient returns for follow up visit. Initially seen by [name] with the following " She initially presented as having  been off of psychiatric medications for approximately 7-8 years.  She had recurrence of depression and feeling of down. Pain exacerbated her depression and tiredness.  Patient has multiple medical conditions including relating to more social pain and arthritis. Last visit we added Prozac not a dose of 20 mg that is fun lift her mood she's feeling better less depressed anxiety is improved she is taking BuSpar for anxiety. Insomnia not worsened Mood symptoms are more in control Back pain fluctuates and that can affect her mood   Modifying factor: her kid Severity of  depression: improved 7/10. 10 being no depression  associated symptoms: back pain  Past Medical History:  Past Medical History:  Diagnosis Date  . Anxiety   . Arthritis   . Asthma    last attack 3 yrs ago  . Chicken pox   . Depression   . Diabetes mellitus without complication ([place])   . Fibromyalgia   . Goiter   . H/O hiatal hernia   . Hypertension    [name]  . Hypothyroidism   . Ovarian cyst   . PONV (postoperative nausea and vomiting)     Past Surgical History:  Procedure Laterality Date  . ABDOMINAL EXPOSURE  12/18/2011   Procedure: ABDOMINAL EXPOSURE;  Surgeon: [name], MD;  Location: [facility];  Service: Vascular;  Laterality: N/A;  Anterior Expossure for anterior lumbar interbody fuson  . ANTERIOR LUMBAR FUSION  12/18/2011   Procedure: ANTERIOR LUMBAR FUSION 1 LEVEL;  Surgeon: [name], MD;  Location: [place] [facility];  Service: Neurosurgery;  Laterality: N/A;  Lumbar four-five Anterior Lumbar Interbody Fusion with Dr. [name] to do anterior exposure  . CESAREAN SECTION    . CHOLECYSTECTOMY    . HERNIA REPAIR    . LIPOMA EXCISION     L breast  . TONSILLECTOMY    . TUMOR EXCISION     Nerve sheath tumor, R arm   Family History:  Family History  Problem Relation Age of Onset  . Heart disease Father   . Lung cancer Father   . Colon cancer Maternal Aunt   . Pancreatic cancer Maternal Grandmother   . Pancreatic cancer Maternal Grandfather   .  Pancreatic cancer Paternal Grandmother   . Cancer Paternal Grandfather   . Sexual abuse Neg Hx    Social History:   Social History   Social History  . Marital status: Divorced    Spouse name: N/A  . Number of children: N/A  . Years of education: N/A   Social History Main Topics  . Smoking status: Never Smoker  . Smokeless tobacco: Never Used  . Alcohol use No  . Drug use: No  . Sexual activity: No   Other Topics Concern  . None   Social History Narrative  . None      Psychiatric Specialty  Exam:      Review of Systems  Constitutional: Negative for fever.  Cardiovascular: Negative for chest pain.  Musculoskeletal: Positive for myalgias.  Neurological: Negative for tremors.  Psychiatric/Behavioral: Negative for substance abuse and suicidal ideas. The patient does not have insomnia.     There were no vitals taken for this visit.There is no height or weight on file to calculate BMI.  General Appearance: Well Groomed  Eye Contact:  Good  Speech:  Clear and Coherent  Volume:  Normal  Mood:  Improved mood.   Affect: less constricted  Thought Process:  Coherent, Goal Directed, Intact, Linear and Logical  Orientation:  Full (Time, Place, and Person)  Thought Content:  WDL  Suicidal Thoughts:  No  Homicidal Thoughts:  No  Memory:  Immediate;   Good Recent;   Good Remote;   Good  Judgement:  Good  Insight:  Present  Psychomotor Activity:  Decreased  Concentration:  Fair  Recall:  Good  Fund of Knowledge:Good  Language: Good  Akathisia:  No  Handed:  Left  AIMS (if indicated):    Assets:  Communication Skills Desire for Improvement Financial Resources/Insurance Housing Resilience Transportation  ADL's:  Intact  Cognition: WNL  Sleep:  poor    Allergies:   Allergies  Allergen Reactions  . Erythromycin Other (See Comments)    arrhythmia  . Zocor  [Simvastatin-High Dose] Rash  . Doxycycline Other (See Comments)    Decreased BP and caused dizziness per patient   Current Medications: Current Outpatient Prescriptions  Medication Sig Dispense Refill  . atorvastatin (LIPITOR) 10 MG tablet Take 1 tablet (10 mg total) by mouth daily. 90 tablet 1  . buPROPion (WELLBUTRIN XL) 300 MG 24 hr tablet Take 1 tablet (300 mg total) by mouth daily. 90 tablet 0  . busPIRone (BUSPAR) 10 MG tablet TAKE 1 TABLET(10 MG) BY MOUTH TWICE DAILY 180 tablet 0  . Calcium-Magnesium-Vitamin D (CALCIUM MAGNESIUM PO) Take 1 tablet by mouth daily.    . cetirizine (ZYRTEC) 5 MG tablet  Take 5 mg by mouth daily.    . Cholecalciferol (VITAMIN D) 2000 UNITS CAPS Take 1 capsule by mouth daily.    . cyclobenzaprine (FLEXERIL) 5 MG tablet   1  . FLUoxetine (PROZAC) 20 MG tablet Take 1 tablet (20 mg total) by mouth daily. 90 tablet 0  . gabapentin (NEURONTIN) 300 MG capsule Take 3 capsules by mouth at bedtime.     [name] glucose blood (ONE TOUCH ULTRA TEST) test strip Test blood sugar once daily. Dx code: 250.00 100 each 12  . hydroxychloroquine (PLAQUENIL) 200 MG tablet Take 400 mg by mouth daily.   2  . lamoTRIgine (LAMICTAL) 150 MG tablet TAKE 1 TABLET(150 MG) BY MOUTH EVERY DAY 30 tablet 0  . levonorgestrel (MIRENA) 20 MCG/24HR IUD 1 each by Intrauterine route once.    [name]  levothyroxine (SYNTHROID, LEVOTHROID) 50 MCG tablet Take 1 tablet (50 mcg total) by mouth daily. 90 tablet 1  . lisinopril (PRINIVIL,ZESTRIL) 5 MG tablet Take 1 tablet (5 mg total) by mouth daily. 90 tablet 0  . metFORMIN (GLUCOPHAGE-XR) 500 MG 24 hr tablet TAKE 1 TABLET BY MOUTH EVERY EVENING 90 tablet 0  . Multiple Vitamin (MULTIVITAMIN WITH MINERALS) TABS Take 1 tablet by mouth daily.    . naproxen sodium (ANAPROX) 220 MG tablet Take 220 mg by mouth 2 (two) times daily with a meal.    . ONETOUCH DELICA LANCETS 99991111 MISC Test blood sugar once daily. Dx code: 250.00 100 each 12  . OVER THE COUNTER MEDICATION Vitamin B-12 3000 mcg 1 table daily.    [name] OVER THE COUNTER MEDICATION Amberen 2 tablets every morning    . oxyCODONE 10 MG TABS Take 1 tablet (10 mg total) by mouth every 6 (six) hours as needed (for pain). 60 tablet 0  . Phenylephrine-Acetaminophen (TYLENOL SINUS CONGESTION/PAIN PO) Take 2 tablets by mouth daily as needed (for sinus congestion).     . Vitamin D, Ergocalciferol, (DRISDOL) 50000 units CAPS capsule Take 1 capsule (50,000 Units total) by mouth every 7 (seven) days. 12 capsule 1   No current facility-administered medications for this visit.      Treatment Plan Summary: Medication management  1.  Depression:improved. Continue prozac and wellbutrin. Also on mood stabilizer lamictal 2. Anxiety:improved. Continue prozac and buspar 3. Insomnia: improved.  4. Back pain: fluctuates. On neurontin.   Provided supportive therapy. Has improved.  Limit use of narcotics for pain and follow closely with pain management clinic and recommendations.   Fu in 2-3 months prozac filled. Can call for other refills for 90 days or 30 days as indicated Merian Capron, MD  1:55 PM 04/04/2016

## 2016-04-13 ENCOUNTER — Other Ambulatory Visit (HOSPITAL_COMMUNITY): Payer: Self-pay | Admitting: *Deleted

## 2016-04-13 ENCOUNTER — Other Ambulatory Visit: Payer: Self-pay | Admitting: Family Medicine

## 2016-04-13 DIAGNOSIS — I10 Essential (primary) hypertension: Secondary | ICD-10-CM

## 2016-04-13 NOTE — Telephone Encounter (Signed)
Can send prescription. Explain safety issues with 90 day supply of meds

## 2016-04-13 NOTE — Telephone Encounter (Signed)
Pt request a 90 day supply for Lamictal 150mg . Pt was last seen on 04/04/16. No refills were issued at last visit. Pt would like prescription sent to [facility]. Please advise.

## 2016-04-17 MED ORDER — LAMOTRIGINE 150 MG PO TABS: ORAL_TABLET | ORAL | 0 refills | Status: DC

## 2016-04-17 NOTE — Telephone Encounter (Signed)
Per Dr. De Nurse, please send prescription for Lamictal 150mg , #90 to [facility]. Pt's next apt is schedule on 06/13/16. Called and informed pt of refill status. Pt verbalizes understanding.

## 2016-05-10 DIAGNOSIS — Z79899 Other long term (current) drug therapy: Secondary | ICD-10-CM | POA: Diagnosis not present

## 2016-05-12 ENCOUNTER — Other Ambulatory Visit: Payer: Self-pay | Admitting: Family Medicine

## 2016-05-12 DIAGNOSIS — IMO0001 Reserved for inherently not codable concepts without codable children: Secondary | ICD-10-CM

## 2016-05-12 DIAGNOSIS — E119 Type 2 diabetes mellitus without complications: Secondary | ICD-10-CM

## 2016-05-12 DIAGNOSIS — E1165 Type 2 diabetes mellitus with hyperglycemia: Principal | ICD-10-CM

## 2016-05-29 ENCOUNTER — Telehealth (HOSPITAL_COMMUNITY): Payer: Self-pay | Admitting: Psychiatry

## 2016-05-29 MED ORDER — BUSPIRONE HCL 10 MG PO TABS: ORAL_TABLET | ORAL | 0 refills | Status: DC

## 2016-05-29 NOTE — Telephone Encounter (Signed)
Pt needs refill on buspar for a  90 rx called into [facility]

## 2016-05-29 NOTE — Telephone Encounter (Signed)
Medication refill- pt called office requesting a 90 day refill for Buspar. Per Dr. De Nurse, refill is authorize for Buspar 10mg , #180. Rx was sent to pharmacy. Pt's next apt is schedule on 06/13/16. Pt verbalizes understanding.

## 2016-06-13 ENCOUNTER — Ambulatory Visit (INDEPENDENT_AMBULATORY_CARE_PROVIDER_SITE_OTHER): Payer: Medicare Other | Admitting: Psychiatry

## 2016-06-13 DIAGNOSIS — F331 Major depressive disorder, recurrent, moderate: Secondary | ICD-10-CM | POA: Diagnosis not present

## 2016-06-13 DIAGNOSIS — Z79899 Other long term (current) drug therapy: Secondary | ICD-10-CM | POA: Diagnosis not present

## 2016-06-13 DIAGNOSIS — M549 Dorsalgia, unspecified: Secondary | ICD-10-CM

## 2016-06-13 DIAGNOSIS — F411 Generalized anxiety disorder: Secondary | ICD-10-CM | POA: Diagnosis not present

## 2016-06-13 DIAGNOSIS — G4700 Insomnia, unspecified: Secondary | ICD-10-CM

## 2016-06-13 DIAGNOSIS — G8929 Other chronic pain: Secondary | ICD-10-CM | POA: Diagnosis not present

## 2016-06-13 MED ORDER — BUPROPION HCL ER (XL) 300 MG PO TB24: 300.0000 mg | ORAL_TABLET | Freq: Every day | ORAL | 0 refills | Status: DC

## 2016-06-13 MED ORDER — LAMOTRIGINE 150 MG PO TABS: ORAL_TABLET | ORAL | 0 refills | Status: DC

## 2016-06-13 MED ORDER — FLUOXETINE HCL 20 MG PO TABS: 20.0000 mg | ORAL_TABLET | Freq: Every day | ORAL | 0 refills | Status: DC

## 2016-06-13 NOTE — Progress Notes (Signed)
Patient ID: [name], female   DOB: September 25, 1965, 51 y.o.   MRN: [id number]  Psychiatric Outpatient Follow up Visit  Patient Identification: Michelle Mueller MRN:  [id number] Date of Evaluation:  06/13/2016 Referral Source: [name] Chief Complaint:   Chief Complaint    Follow-up     Visit Diagnosis: MDD. GAD . Mood related to Chronic back pain Diagnosis:   Patient Active Problem List   Diagnosis Date Noted  . Chest pain of uncertain etiology [K27.06] 12/21/2014  . Essential hypertension [I10] 12/21/2014  . Myalgia [M79.1] 11/10/2014  . Insomnia [G47.00] 11/10/2014  . UTI (urinary tract infection) [N39.0] 08/04/2013  . Fibromyalgia muscle pain [M79.7] 06/13/2013  . Severe depression ([place]) [F32.2] 06/13/2013  . Diabetes mellitus, type II ([place]) [E11.9] 06/13/2013  . Rheumatoid arthritis ([place]) [M06.9] 03/19/2013  . Fibromyalgia [M79.7] 03/19/2013  . Unspecified hypothyroidism [E03.9] 03/19/2013  . Goiter [E04.9] 03/19/2013  . Obesity (BMI 30-39.9) [E66.9] 03/19/2013  . 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 Family history of pancreatic cancer [Z80.0] 01/18/2011  . Chronic diarrhea [K52.9] 01/18/2011  . ACUTE PHARYNGITIS [J02.9] 10/24/2010   History of Present Illness:   Patient returns for follow up visit. Initially seen by [name] with the following " She initially presented as having  been off of psychiatric medications for approximately 7-8 years.  She had recurrence of depression and feeling of down. Pain exacerbated her depression and tiredness.  Patient has multiple medical conditions including relating to more social pain and arthritis. Her mood and depression is better since her arthritic pain is less with the new medication Prozac is also been helping including BuSpar Wellbutrin helps the depression Fatigue and myalgias less intense   Back pain fluctuates and that can affect her mood   Modifying factor: her kid Severity of  depression:better.   associated symptoms: back pain  Past Medical History:  Past Medical History:  Diagnosis Date  . Anxiety   . Arthritis   . Asthma    last attack 3 yrs ago  . Chicken pox   . Depression   . Diabetes mellitus without complication ([place])   . Fibromyalgia   . Goiter   . H/O hiatal hernia   . Hypertension    [name]  . Hypothyroidism   . Ovarian cyst   . PONV (postoperative nausea and vomiting)     Past Surgical History:  Procedure Laterality Date  . ABDOMINAL EXPOSURE  12/18/2011   Procedure: ABDOMINAL EXPOSURE;  Surgeon: [name], MD;  Location: [facility];  Service: Vascular;  Laterality: N/A;  Anterior Expossure for anterior lumbar interbody fuson  . ANTERIOR LUMBAR FUSION  12/18/2011   Procedure: ANTERIOR LUMBAR FUSION 1 LEVEL;  Surgeon: [name], MD;  Location: [place] [facility];  Service: Neurosurgery;  Laterality: N/A;  Lumbar four-five Anterior Lumbar Interbody Fusion with Dr. [name] to do anterior exposure  . CESAREAN SECTION    . CHOLECYSTECTOMY    . HERNIA REPAIR    . LIPOMA EXCISION     L breast  . TONSILLECTOMY    . TUMOR EXCISION     Nerve sheath tumor, R arm   Family History:  Family History  Problem Relation Age of Onset  . Heart disease Father   . Lung cancer Father   . Colon cancer Maternal Aunt   . Pancreatic cancer Maternal Grandmother   . Pancreatic cancer Maternal Grandfather   . Pancreatic cancer Paternal Grandmother   . Cancer Paternal Grandfather   .  Sexual abuse Neg Hx    Social History:   Social History   Social History  . Marital status: Divorced    Spouse name: N/A  . Number of children: N/A  . Years of education: N/A   Social History Main Topics  . Smoking status: Never Smoker  . Smokeless tobacco: Never Used  . Alcohol use No  . Drug use: No  . Sexual activity: No   Other Topics Concern  . Not on file   Social History Narrative  . No narrative on file      Psychiatric Specialty Exam:       Review of Systems  Constitutional: Negative for fever.  Cardiovascular: Negative for palpitations.  Neurological: Negative for tremors.  Psychiatric/Behavioral: Negative for depression, substance abuse and suicidal ideas. The patient does not have insomnia.     There were no vitals taken for this visit.There is no height or weight on file to calculate BMI.  General Appearance: Well Groomed  Eye Contact:  Good  Speech:  Clear and Coherent  Volume:  Normal  Mood: euthymic  Affect:more reactive  Thought Process:  Coherent, Goal Directed, Intact, Linear and Logical  Orientation:  Full (Time, Place, and Person)  Thought Content:  WDL  Suicidal Thoughts:  No  Homicidal Thoughts:  No  Memory:  Immediate;   Good Recent;   Good Remote;   Good  Judgement:  Good  Insight:  Present  Psychomotor Activity:  Decreased  Concentration:  Fair  Recall:  Good  Fund of Knowledge:Good  Language: Good  Akathisia:  No  Handed:  Left  AIMS (if indicated):    Assets:  Communication Skills Desire for Improvement Financial Resources/Insurance Housing Resilience Transportation  ADL's:  Intact  Cognition: WNL  Sleep:  poor    Allergies:   Allergies  Allergen Reactions  . Erythromycin Other (See Comments)    arrhythmia  . Zocor  [Simvastatin-High Dose] Rash  . Doxycycline Other (See Comments)    Decreased BP and caused dizziness per patient   Current Medications: Current Outpatient Prescriptions  Medication Sig Dispense Refill  . atorvastatin (LIPITOR) 10 MG tablet Take 1 tablet (10 mg total) by mouth daily. 90 tablet 1  . buPROPion (WELLBUTRIN XL) 300 MG 24 hr tablet Take 1 tablet (300 mg total) by mouth daily. 90 tablet 0  . busPIRone (BUSPAR) 10 MG tablet TAKE 1 TABLET(10 MG) BY MOUTH TWICE DAILY 180 tablet 0  . Calcium-Magnesium-Vitamin D (CALCIUM MAGNESIUM PO) Take 1 tablet by mouth daily.    . cetirizine (ZYRTEC) 5 MG tablet Take 5 mg by mouth daily.    . Cholecalciferol (VITAMIN  D) 2000 UNITS CAPS Take 1 capsule by mouth daily.    . cyclobenzaprine (FLEXERIL) 5 MG tablet   1  . FLUoxetine (PROZAC) 20 MG tablet Take 1 tablet (20 mg total) by mouth daily. 90 tablet 0  . gabapentin (NEURONTIN) 300 MG capsule Take 3 capsules by mouth at bedtime.     [name] glucose blood (ONE TOUCH ULTRA TEST) test strip Test blood sugar once daily. Dx code: 250.00 100 each 12  . hydroxychloroquine (PLAQUENIL) 200 MG tablet Take 400 mg by mouth daily.   2  . lamoTRIgine (LAMICTAL) 150 MG tablet TAKE 1 TABLET(150 MG) BY MOUTH EVERY DAY 90 tablet 0  . levonorgestrel (MIRENA) 20 MCG/24HR IUD 1 each by Intrauterine route once.    [name] levothyroxine (SYNTHROID, LEVOTHROID) 50 MCG tablet Take 1 tablet (50 mcg total) by mouth daily. [place]  tablet 1  . lisinopril (PRINIVIL,ZESTRIL) 5 MG tablet Take 1 tablet (5 mg total) by mouth daily. 90 tablet 0  . lisinopril (PRINIVIL,ZESTRIL) 5 MG tablet TAKE 1 TABLET BY MOUTH EVERY DAY 90 tablet 1  . metFORMIN (GLUCOPHAGE-XR) 500 MG 24 hr tablet TAKE 1 TABLET BY MOUTH EVERY EVENING 90 tablet 1  . Multiple Vitamin (MULTIVITAMIN WITH MINERALS) TABS Take 1 tablet by mouth daily.    . naproxen sodium (ANAPROX) 220 MG tablet Take 220 mg by mouth 2 (two) times daily with a meal.    . ONETOUCH DELICA LANCETS 25Z MISC Test blood sugar once daily. Dx code: 250.00 100 each 12  . OVER THE COUNTER MEDICATION Vitamin B-12 3000 mcg 1 table daily.    [name] OVER THE COUNTER MEDICATION Amberen 2 tablets every morning    . oxyCODONE 10 MG TABS Take 1 tablet (10 mg total) by mouth every 6 (six) hours as needed (for pain). 60 tablet 0  . Phenylephrine-Acetaminophen (TYLENOL SINUS CONGESTION/PAIN PO) Take 2 tablets by mouth daily as needed (for sinus congestion).     . Vitamin D, Ergocalciferol, (DRISDOL) 50000 units CAPS capsule Take 1 capsule (50,000 Units total) by mouth every 7 (seven) days. 12 capsule 1   No current facility-administered medications for this visit.      Treatment Plan  Summary: Medication management  1. Depression:: not worse. Feels better, continue prozac, wellbutrin and lamictal. No rash  2. Anxiety:baseline. Continue buspar and prozac  3. Insomnia: better. Sleep hygiene reviewed 4. Back pain: fluctuates. Also on neurontin  Provided supportive therapy. Reviewed side effects. Will send prescriptions.  Fu 3 months Merian Capron, MD  1:47 PM 06/13/2016

## 2016-06-14 DIAGNOSIS — M0609 Rheumatoid arthritis without rheumatoid factor, multiple sites: Secondary | ICD-10-CM | POA: Diagnosis not present

## 2016-06-14 DIAGNOSIS — Z79899 Other long term (current) drug therapy: Secondary | ICD-10-CM | POA: Diagnosis not present

## 2016-06-14 DIAGNOSIS — L230 Allergic contact dermatitis due to metals: Secondary | ICD-10-CM | POA: Diagnosis not present

## 2016-06-21 ENCOUNTER — Other Ambulatory Visit: Payer: Self-pay | Admitting: Family Medicine

## 2016-06-21 ENCOUNTER — Other Ambulatory Visit: Payer: Self-pay

## 2016-06-21 DIAGNOSIS — N63 Unspecified lump in unspecified breast: Secondary | ICD-10-CM

## 2016-07-03 DIAGNOSIS — M791 Myalgia: Secondary | ICD-10-CM | POA: Diagnosis not present

## 2016-07-03 DIAGNOSIS — I10 Essential (primary) hypertension: Secondary | ICD-10-CM | POA: Diagnosis not present

## 2016-07-03 DIAGNOSIS — M0600 Rheumatoid arthritis without rheumatoid factor, unspecified site: Secondary | ICD-10-CM | POA: Diagnosis not present

## 2016-07-03 DIAGNOSIS — M545 Low back pain: Secondary | ICD-10-CM | POA: Diagnosis not present

## 2016-07-03 DIAGNOSIS — Z6832 Body mass index (BMI) 32.0-32.9, adult: Secondary | ICD-10-CM | POA: Diagnosis not present

## 2016-07-06 ENCOUNTER — Ambulatory Visit
Admission: RE | Admit: 2016-07-06 | Discharge: 2016-07-06 | Disposition: A | Discharge status: Home / Self Care | Payer: BLUE CROSS/BLUE SHIELD | Source: Ambulatory Visit | Attending: Family Medicine | Admitting: Family Medicine

## 2016-07-06 ENCOUNTER — Other Ambulatory Visit: Payer: Self-pay | Admitting: Family Medicine

## 2016-07-06 DIAGNOSIS — N63 Unspecified lump in unspecified breast: Secondary | ICD-10-CM

## 2016-07-06 DIAGNOSIS — N6489 Other specified disorders of breast: Secondary | ICD-10-CM | POA: Diagnosis not present

## 2016-07-06 DIAGNOSIS — R928 Other abnormal and inconclusive findings on diagnostic imaging of breast: Secondary | ICD-10-CM | POA: Diagnosis not present

## 2016-07-10 ENCOUNTER — Encounter: Payer: Self-pay | Admitting: Obstetrics & Gynecology

## 2016-07-10 ENCOUNTER — Ambulatory Visit (INDEPENDENT_AMBULATORY_CARE_PROVIDER_SITE_OTHER): Payer: Medicare Other | Admitting: Obstetrics & Gynecology

## 2016-07-10 VITALS — BP 124/77 | HR 82 | Resp 16 | Ht 64.0 in | Wt 193.0 lb

## 2016-07-10 DIAGNOSIS — N951 Menopausal and female climacteric states: Secondary | ICD-10-CM | POA: Diagnosis not present

## 2016-07-10 MED ORDER — ESTRADIOL 0.0375 MG/24HR TD PTTW: 1.0000 | MEDICATED_PATCH | TRANSDERMAL | 12 refills | Status: DC

## 2016-07-10 NOTE — Progress Notes (Signed)
Last pap 2016 WNL

## 2016-07-10 NOTE — Progress Notes (Signed)
   Subjective:    Patient ID: Michelle Mueller, female    DOB: Nov 02, 1965, 51 y.o.   MRN: [id number]  HPI 51 year old female presents for [place] hot flashes, night sweats, and changes in mood and behavior. Patient has been perimenopausal for a good year with increasing hot flashes. Last [facility] was 91. Patient has an occasional 2 day period with the Mirena in. She is not in menopause yet as she has had a period within the past year. Patient is regarding on gabapentin and Prozac for her fibromyalgia and depression. Given the severity of her hot flashes and inability to sleep we discussed starting estrogen. Patient has a Mirena in her uterus and does not need endometrial protection. She can only do the patch. We discussed in depth the women's health initiative and the risk of breast cancer as well as clot stroke and cardiac events. Patient is willing to try hormone replacement therapy.   Review of Systems  Constitutional: Positive for diaphoresis and fatigue.  Endocrine: Positive for heat intolerance.  Genitourinary: Negative for menstrual problem and vaginal discharge.  Psychiatric/Behavioral: Positive for agitation and dysphoric mood.       Objective:   Physical Exam  Constitutional: She is oriented to person, place, and time. She appears well-developed and well-nourished. No distress.  HENT:  Head: Normocephalic and atraumatic.  Eyes: Conjunctivae are normal.  Pulmonary/Chest: Effort normal.  Abdominal: Bowel sounds are normal.  Musculoskeletal: She exhibits no edema.  Neurological: She is alert and oriented to person, place, and time.  Skin: Skin is warm and dry.  Psychiatric: She has a normal mood and affect.  Vitals reviewed.  Vitals:   07/10/16 1522  BP: 124/77  Pulse: 82  Resp: 16  Weight: 193 lb (87.5 kg)  Height: 5\' 4"  (1.626 m)     Assessment & Plan:  51 year old female with severe menopausal symptoms.  1.  Vi velle-Dot .0375 mg 2.  Rheumatologist follows her liver and  kidney panel due to rheumatoid arthritis drug toxicity. 3.  Mirena is endometrium protective sono progesterone needed 4.  Return to clinic in 2 months.

## 2016-07-12 ENCOUNTER — Other Ambulatory Visit (HOSPITAL_COMMUNITY): Payer: Self-pay | Admitting: Psychiatry

## 2016-07-12 NOTE — Telephone Encounter (Signed)
Received fax from [facility] requesting a refill for Wellbutrin and Lamictal. Per Dr. De Nurse, refill request is denied. Refills were sent to pharmacy on 06/13/16. Nothing further is need at this time.

## 2016-07-18 ENCOUNTER — Telehealth: Payer: Self-pay | Admitting: Obstetrics & Gynecology

## 2016-07-18 NOTE — Telephone Encounter (Signed)
I called patient and spoke with her in regards to her Vivelle-DOT.  She states she has not felt any better since starting the patch.  I advised her to give it another week, if no better call office back. She states that is what she was thinking she should do. Patient voiced understanding and agreed with plan.

## 2016-07-18 NOTE — Telephone Encounter (Signed)
Pt started Estrogen patch last week and nothing has changed. She is still having night sweats and hot flashes and wants to know what to do. She wonders if she needs to give it another week or if she should try something else. Please advise.

## 2016-07-24 ENCOUNTER — Other Ambulatory Visit: Payer: Self-pay | Admitting: Family Medicine

## 2016-07-24 DIAGNOSIS — E039 Hypothyroidism, unspecified: Secondary | ICD-10-CM

## 2016-07-24 DIAGNOSIS — E785 Hyperlipidemia, unspecified: Secondary | ICD-10-CM

## 2016-07-28 ENCOUNTER — Ambulatory Visit (INDEPENDENT_AMBULATORY_CARE_PROVIDER_SITE_OTHER): Payer: Medicare Other | Admitting: Family Medicine

## 2016-07-28 VITALS — BP 130/84 | HR 110 | Temp 98.7°F | Ht 64.0 in | Wt 192.8 lb

## 2016-07-28 DIAGNOSIS — E559 Vitamin D deficiency, unspecified: Secondary | ICD-10-CM | POA: Diagnosis not present

## 2016-07-28 DIAGNOSIS — E1165 Type 2 diabetes mellitus with hyperglycemia: Secondary | ICD-10-CM

## 2016-07-28 DIAGNOSIS — IMO0002 Reserved for concepts with insufficient information to code with codable children: Secondary | ICD-10-CM

## 2016-07-28 DIAGNOSIS — I10 Essential (primary) hypertension: Secondary | ICD-10-CM

## 2016-07-28 DIAGNOSIS — E785 Hyperlipidemia, unspecified: Secondary | ICD-10-CM

## 2016-07-28 DIAGNOSIS — E1151 Type 2 diabetes mellitus with diabetic peripheral angiopathy without gangrene: Secondary | ICD-10-CM

## 2016-07-28 DIAGNOSIS — E1169 Type 2 diabetes mellitus with other specified complication: Secondary | ICD-10-CM

## 2016-07-28 NOTE — Patient Instructions (Signed)

## 2016-07-28 NOTE — Progress Notes (Signed)
Patient ID: Michelle Mueller, female    DOB: November 06, 1965  Age: 51 y.o. MRN: [id number]    Subjective:  Subjective  HPI Michelle Mueller presents for dm II, htn , hyperlipidemia.    HYPERTENSION   Blood pressure range--- not checking   Chest pain- no      Dyspnea- no Lightheadedness- no   Edema- no  Other side effects - no   Medication compliance: good Low salt diet- yes    DIABETES    Blood Sugar ranges-good per pt  Polyuria- no New Visual problems- no  Hypoglycemic symptoms- no  Other side effects-no Medication compliance - good Last eye exam- due Foot exam- today   HYPERLIPIDEMIA  Medication compliance- good RUQ pain- no  Muscle aches- no Other side effects-no   Review of Systems  Constitutional: Negative for activity change, appetite change, diaphoresis, fatigue and unexpected weight change.  Eyes: Negative for pain, redness and visual disturbance.  Respiratory: Negative for cough, chest tightness, shortness of breath and wheezing.   Cardiovascular: Negative for chest pain, palpitations and leg swelling.  Endocrine: Negative for cold intolerance, heat intolerance, polydipsia, polyphagia and polyuria.  Genitourinary: Negative for difficulty urinating, dysuria and frequency.  Neurological: Negative for dizziness, light-headedness, numbness and headaches.  Psychiatric/Behavioral: Negative for behavioral problems and dysphoric mood. The patient is not nervous/anxious.     History Past Medical History:  Diagnosis Date  . Anxiety   . Arthritis   . Asthma    last attack 3 yrs ago  . Chicken pox   . Depression   . Diabetes mellitus without complication ([place])   . Fibromyalgia   . Goiter   . H/O hiatal hernia   . Hypertension    [name]  . Hypothyroidism   . Ovarian cyst   . PONV (postoperative nausea and vomiting)     She has a past surgical history that includes Cholecystectomy; Cesarean section; Tonsillectomy; Hernia repair; Lipoma excision; Tumor excision;  Anterior lumbar fusion (12/18/2011); and Abdominal exposure (12/18/2011).   Her family history includes Cancer in her paternal grandfather; Colon cancer in her maternal aunt; Heart disease in her father; Lung cancer in her father; Pancreatic cancer in her maternal grandfather, maternal grandmother, and paternal grandmother.She reports that she has never smoked. She has never used smokeless tobacco. She reports that she does not drink alcohol or use drugs.  Current Outpatient Prescriptions on File Prior to Visit  Medication Sig Dispense Refill  . atorvastatin (LIPITOR) 10 MG tablet TAKE 1 TABLET(10 MG) BY MOUTH DAILY 90 tablet 0  . buPROPion (WELLBUTRIN XL) 300 MG 24 hr tablet Take 1 tablet (300 mg total) by mouth daily. 90 tablet 0  . busPIRone (BUSPAR) 10 MG tablet TAKE 1 TABLET(10 MG) BY MOUTH TWICE DAILY 180 tablet 0  . Calcium-Magnesium-Vitamin D (CALCIUM MAGNESIUM PO) Take 1 tablet by mouth daily.    . cetirizine (ZYRTEC) 5 MG tablet Take 5 mg by mouth daily.    . cyclobenzaprine (FLEXERIL) 5 MG tablet   1  . estradiol (VIVELLE-DOT) 0.0375 MG/24HR Place 1 patch onto the skin 2 (two) times a week. 8 patch 12  . FLUoxetine (PROZAC) 20 MG tablet Take 1 tablet (20 mg total) by mouth daily. 90 tablet 0  . gabapentin (NEURONTIN) 300 MG capsule Take 3 capsules by mouth at bedtime.     [name] glucose blood (ONE TOUCH ULTRA TEST) test strip Test blood sugar once daily. Dx code: 250.00 100 each 12  . hydroxychloroquine (PLAQUENIL) 200 MG  tablet Take 400 mg by mouth daily.   2  . lamoTRIgine (LAMICTAL) 150 MG tablet TAKE 1 TABLET(150 MG) BY MOUTH EVERY DAY 90 tablet 0  . leflunomide (ARAVA) 20 MG tablet Take by mouth.    . levonorgestrel (MIRENA) 20 MCG/24HR IUD 1 each by Intrauterine route once.    [name] levothyroxine (SYNTHROID, LEVOTHROID) 50 MCG tablet TAKE 1 TABLET BY MOUTH EVERY DAY 90 tablet 0  . lisinopril (PRINIVIL,ZESTRIL) 5 MG tablet Take 1 tablet (5 mg total) by mouth daily. 90 tablet 0  .  metFORMIN (GLUCOPHAGE-XR) 500 MG 24 hr tablet TAKE 1 TABLET BY MOUTH EVERY EVENING 90 tablet 1  . Multiple Vitamin (MULTIVITAMIN WITH MINERALS) TABS Take 1 tablet by mouth daily.    . naproxen sodium (ANAPROX) 220 MG tablet Take 220 mg by mouth 2 (two) times daily with a meal.    . naproxen sodium (ANAPROX) 220 MG tablet Take 220 mg by mouth.    Glory Rosebush DELICA LANCETS 02O MISC Test blood sugar once daily. Dx code: 250.00 100 each 12  . OVER THE COUNTER MEDICATION Vitamin B-12 3000 mcg 1 table daily.    [name] OVER THE COUNTER MEDICATION Amberen 2 tablets every morning    . oxyCODONE 10 MG TABS Take 1 tablet (10 mg total) by mouth every 6 (six) hours as needed (for pain). 60 tablet 0  . oxycodone-acetaminophen (LYNOX) 10-300 MG tablet Take 1 tablet by mouth 4 (four) times daily.    [name] Phenylephrine-Acetaminophen (TYLENOL SINUS CONGESTION/PAIN PO) Take 2 tablets by mouth daily as needed (for sinus congestion).     . Vitamin D, Ergocalciferol, (DRISDOL) 50000 units CAPS capsule Take 1 capsule (50,000 Units total) by mouth every 7 (seven) days. 12 capsule 1   No current facility-administered medications on file prior to visit.      Objective:  Objective  Physical Exam  Constitutional: She is oriented to person, place, and time. She appears well-developed and well-nourished. No distress.  HENT:  Head: Normocephalic and atraumatic.  Right Ear: External ear normal.  Left Ear: External ear normal.  Nose: Nose normal.  Mouth/Throat: Oropharynx is clear and moist.  Eyes: Conjunctivae and EOM are normal. Pupils are equal, round, and reactive to light. Right eye exhibits no discharge. Left eye exhibits no discharge.  Neck: Normal range of motion. Neck supple. No JVD present. No thyromegaly present.  Cardiovascular: Normal rate, regular rhythm, normal heart sounds and intact distal pulses.   No murmur heard. Pulmonary/Chest: Effort normal and breath sounds normal. No respiratory distress. She has no  wheezes. She has no rales. She exhibits no tenderness.  Genitourinary: Vagina normal and uterus normal.  Musculoskeletal: Normal range of motion.  Lymphadenopathy:    She has no cervical adenopathy.  Neurological: She is alert and oriented to person, place, and time. She has normal reflexes.  Skin: Skin is warm and dry. No rash noted. She is not diaphoretic. No erythema.  Psychiatric: She has a normal mood and affect. Her behavior is normal. Judgment and thought content normal.  Nursing note and vitals reviewed.  BP 130/84   Pulse (!) 110   Temp 98.7 F (37.1 C) (Oral)   Ht 5' 4"  (1.626 m)   Wt 192 lb 12.8 oz (87.5 kg)   SpO2 98%   BMI 33.09 kg/m  Wt Readings from Last 3 Encounters:  07/28/16 192 lb 12.8 oz (87.5 kg)  07/10/16 193 lb (87.5 kg)  01/28/16 194 lb 6.4 oz (88.2 kg)  Lab Results  Component Value Date   WBC 6.9 10/14/2015   HGB 13.1 10/14/2015   HCT 38.5 10/14/2015   PLT 270.0 10/14/2015   GLUCOSE 71 07/28/2016   CHOL 152 07/28/2016   TRIG 96 07/28/2016   HDL 88 07/28/2016   LDLCALC 45 07/28/2016   ALT 19 07/28/2016   AST 18 07/28/2016   NA 141 07/28/2016   K 3.8 07/28/2016   CL 105 07/28/2016   CREATININE 1.07 07/28/2016   BUN 13 07/28/2016   CO2 22 07/28/2016   TSH 1.11 01/28/2016   HGBA1C 4.7 07/28/2016   MICROALBUR 0.5 08/28/2013    US Breast Ltd Uni Left Inc Axilla  Result Date: 07/06/2016 CLINICAL DATA:  Six-month follow-up for probably benign finding in the left breast. Diagnostic mammogram was performed on 12/20/2015 for a possible asymmetry identified within the left breast on a screening mammogram of 12/13/2015. On that diagnostic report, the area in question was deemed to represent superimposed fibroglandular tissues, with an additional benign intramammary lymph node which was felt to be likely contributory to the screening mammogram finding as well. Diagnostic ultrasound report of 12/20/2015 described a vague hypoechoic area within the left  breast at the 11 o'clock axis, 8 cm from the nipple, without mammographic correlate, corresponding to a palpable abnormality described by the patient, for which ultrasound-guided biopsy was recommended. The ultrasound-guided biopsy was not performed as the finding could not be reproduced at the time of the scheduled biopsy, suspected to be benign fat lobule and/or artifact, and six-month follow-up diagnostic examination was recommended to ensure stability of all findings. EXAM: 2D DIGITAL DIAGNOSTIC LEFT MAMMOGRAM WITH CAD AND ADJUNCT TOMO ULTRASOUND LEFT BREAST COMPARISON:  Previous exams including diagnostic mammogram and ultrasound dated 12/20/2015. ACR Breast Density Category b: There are scattered areas of fibroglandular density. FINDINGS: The previously questioned asymmetry within the posterior left breast remains stable in appearance, again consistent with normal superimposed fibroglandular tissues, with adjacent benign intramammary lymph node possibly contributory, and without significant change overall compared to an earlier screening mammogram of 11/19/2014. There are no new dominant masses, suspicious calcifications or secondary signs of malignancy within the left breast. Mammographic images were processed with CAD. Patient describes a palpable lump within the upper left breast. I feel vague soft tissue thickening in this area without evidence of fixed or circumscribed mass. Targeted ultrasound is performed, again showing a vague hypoechoic area within the left breast at the 11 o'clock axis, 8 cm from the nipple, slightly smaller on today's exam with a measurement of 7 mm greatest dimension (previously 9 mm), today most suggestive of shadowing artifact due to overlying ligaments during real-time ultrasound evaluation. Additional targeted ultrasound of the area of patient's palpable lump, as directed by the patient, shows only normal fibroglandular tissues and fat lobules. No suspicious solid or cystic  mass is identified. The vague hypoechoic area described above is in the vicinity of the palpable lump but does not correspond directly. IMPRESSION: 1. Stable probably benign hypoechoic area within the left breast at the 11 o'clock axis, 8 cm from the nipple, decreased in size compared to the earlier ultrasound, without mammographic correlate, most suggestive of shadowing artifact due to overlying ligaments during real-time ultrasound evaluation. 2.  No mammographic evidence of malignancy. 3. No mammographic or sonographic evidence of malignancy at the site of patient's palpable lump in the upper left breast. Patient states that the lump has not changed since earlier exams. Recommend additional follow-up diagnostic mammogram and ultrasound in 6 months  to ensure continued stability. This will be performed as a bilateral diagnostic mammogram in conjunction with patient's routine right breast screening mammogram schedule. The patient was instructed to return sooner if the area that she feels becomes larger and/or firmer to palpation, or if a new palpable abnormality is identified in either breast. RECOMMENDATION: Bilateral diagnostic mammogram, and left breast ultrasound, in 6 months. I have discussed the findings and recommendations with the patient. Results were also provided in writing at the conclusion of the visit. If applicable, a reminder letter will be sent to the patient regarding the next appointment. BI-RADS CATEGORY  3: Probably benign. Electronically Signed   By: Franki Cabot M.D.   On: 07/06/2016 16:33   Mm Diag Breast Tomo Uni Left  Result Date: 07/06/2016 CLINICAL DATA:  Six-month follow-up for probably benign finding in the left breast. Diagnostic mammogram was performed on 12/20/2015 for a possible asymmetry identified within the left breast on a screening mammogram of 12/13/2015. On that diagnostic report, the area in question was deemed to represent superimposed fibroglandular tissues, with an  additional benign intramammary lymph node which was felt to be likely contributory to the screening mammogram finding as well. Diagnostic ultrasound report of 12/20/2015 described a vague hypoechoic area within the left breast at the 11 o'clock axis, 8 cm from the nipple, without mammographic correlate, corresponding to a palpable abnormality described by the patient, for which ultrasound-guided biopsy was recommended. The ultrasound-guided biopsy was not performed as the finding could not be reproduced at the time of the scheduled biopsy, suspected to be benign fat lobule and/or artifact, and six-month follow-up diagnostic examination was recommended to ensure stability of all findings. EXAM: 2D DIGITAL DIAGNOSTIC LEFT MAMMOGRAM WITH CAD AND ADJUNCT TOMO ULTRASOUND LEFT BREAST COMPARISON:  Previous exams including diagnostic mammogram and ultrasound dated 12/20/2015. ACR Breast Density Category b: There are scattered areas of fibroglandular density. FINDINGS: The previously questioned asymmetry within the posterior left breast remains stable in appearance, again consistent with normal superimposed fibroglandular tissues, with adjacent benign intramammary lymph node possibly contributory, and without significant change overall compared to an earlier screening mammogram of 11/19/2014. There are no new dominant masses, suspicious calcifications or secondary signs of malignancy within the left breast. Mammographic images were processed with CAD. Patient describes a palpable lump within the upper left breast. I feel vague soft tissue thickening in this area without evidence of fixed or circumscribed mass. Targeted ultrasound is performed, again showing a vague hypoechoic area within the left breast at the 11 o'clock axis, 8 cm from the nipple, slightly smaller on today's exam with a measurement of 7 mm greatest dimension (previously 9 mm), today most suggestive of shadowing artifact due to overlying ligaments during  real-time ultrasound evaluation. Additional targeted ultrasound of the area of patient's palpable lump, as directed by the patient, shows only normal fibroglandular tissues and fat lobules. No suspicious solid or cystic mass is identified. The vague hypoechoic area described above is in the vicinity of the palpable lump but does not correspond directly. IMPRESSION: 1. Stable probably benign hypoechoic area within the left breast at the 11 o'clock axis, 8 cm from the nipple, decreased in size compared to the earlier ultrasound, without mammographic correlate, most suggestive of shadowing artifact due to overlying ligaments during real-time ultrasound evaluation. 2.  No mammographic evidence of malignancy. 3. No mammographic or sonographic evidence of malignancy at the site of patient's palpable lump in the upper left breast. Patient states that the lump has not changed  since earlier exams. Recommend additional follow-up diagnostic mammogram and ultrasound in 6 months to ensure continued stability. This will be performed as a bilateral diagnostic mammogram in conjunction with patient's routine right breast screening mammogram schedule. The patient was instructed to return sooner if the area that she feels becomes larger and/or firmer to palpation, or if a new palpable abnormality is identified in either breast. RECOMMENDATION: Bilateral diagnostic mammogram, and left breast ultrasound, in 6 months. I have discussed the findings and recommendations with the patient. Results were also provided in writing at the conclusion of the visit. If applicable, a reminder letter will be sent to the patient regarding the next appointment. BI-RADS CATEGORY  3: Probably benign. Electronically Signed   By: Franki Cabot M.D.   On: 07/06/2016 16:33     Assessment & Plan:  Plan  I am having Ms. Peil maintain her Phenylephrine-Acetaminophen (TYLENOL SINUS CONGESTION/PAIN PO), multivitamin with minerals, Oxycodone HCl, gabapentin,  glucose blood, ONETOUCH DELICA LANCETS 30Z, OVER THE COUNTER MEDICATION, cetirizine, hydroxychloroquine, cyclobenzaprine, levonorgestrel, naproxen sodium, Calcium-Magnesium-Vitamin D (CALCIUM MAGNESIUM PO), OVER THE COUNTER MEDICATION, lisinopril, Vitamin D (Ergocalciferol), metFORMIN, busPIRone, buPROPion, FLUoxetine, lamoTRIgine, leflunomide, oxycodone-acetaminophen, naproxen sodium, estradiol, atorvastatin, levothyroxine, and predniSONE.  Meds ordered this encounter  Medications  . predniSONE (DELTASONE) 5 MG tablet    Sig: Take 4 tabs (20 mg) for 4 days, 3 tabs (15 mg ) for 4 days, 2 tabs (10 mg) for 4 days, then 1 tab (5 mg) for 4 days and then stop.    Problem List Items Addressed This Visit      Unprioritized   DM (diabetes mellitus) type II uncontrolled, periph vascular disorder ([place])   Relevant Orders   Comp Met (CMET) (Completed)   HgB A1c (Completed)   Lipid panel (Completed)   VITAMIN D 25 Hydroxy (Vit-D Deficiency, Fractures) (Completed)   Essential hypertension    Well controlled, no changes to meds. Encouraged heart healthy diet such as the DASH diet and exercise as tolerated.       Relevant Orders   Comp Met (CMET) (Completed)   HgB A1c (Completed)   Lipid panel (Completed)   VITAMIN D 25 Hydroxy (Vit-D Deficiency, Fractures) (Completed)   Hyperlipidemia associated with type 2 diabetes mellitus ([place])    Tolerating statin, encouraged heart healthy diet, avoid trans fats, minimize simple carbs and saturated fats. Increase exercise as tolerated      Relevant Orders   Comp Met (CMET) (Completed)   HgB A1c (Completed)   Lipid panel (Completed)   VITAMIN D 25 Hydroxy (Vit-D Deficiency, Fractures) (Completed)    Other Visit Diagnoses    Vitamin D deficiency    -  Primary   Relevant Orders   Comp Met (CMET) (Completed)   HgB A1c (Completed)   Lipid panel (Completed)   VITAMIN D 25 Hydroxy (Vit-D Deficiency, Fractures) (Completed)      Follow-up: Return in about 6  months (around 01/27/2017) for hypertension, hyperlipidemia, diabetes II.  Ann Held, DO

## 2016-07-29 LAB — LIPID PANEL
Cholesterol: 152 mg/dL (ref <200)
HDL: 88 mg/dL (ref >50)
LDL CALC: 45 mg/dL (ref <100)
Total CHOL/HDL Ratio: 1.7 Ratio (ref <5.0)
Triglycerides: 96 mg/dL (ref <150)
VLDL: 19 mg/dL (ref <30)

## 2016-07-29 LAB — COMPREHENSIVE METABOLIC PANEL
ALT: 19 U/L (ref 6–29)
AST: 18 U/L (ref 10–35)
Albumin: 4.3 g/dL (ref 3.6–5.1)
Alkaline Phosphatase: 52 U/L (ref 33–115)
BILIRUBIN TOTAL: 0.6 mg/dL (ref 0.2–1.2)
BUN: 13 mg/dL (ref 7–25)
CHLORIDE: 105 mmol/L (ref 98–110)
CO2: 22 mmol/L (ref 20–31)
Calcium: 9.2 mg/dL (ref 8.6–10.2)
Creat: 1.07 mg/dL (ref 0.50–1.10)
Glucose, Bld: 71 mg/dL (ref 65–99)
Potassium: 3.8 mmol/L (ref 3.5–5.3)
Sodium: 141 mmol/L (ref 135–146)
TOTAL PROTEIN: 6.5 g/dL (ref 6.1–8.1)

## 2016-07-29 LAB — VITAMIN D 25 HYDROXY (VIT D DEFICIENCY, FRACTURES): VIT D 25 HYDROXY: 57 ng/mL (ref 30–100)

## 2016-07-29 LAB — HEMOGLOBIN A1C
HEMOGLOBIN A1C: 4.7 % (ref <5.7)
MEAN PLASMA GLUCOSE: 88 mg/dL

## 2016-07-30 ENCOUNTER — Encounter: Payer: Self-pay | Admitting: Family Medicine

## 2016-07-30 DIAGNOSIS — E785 Hyperlipidemia, unspecified: Secondary | ICD-10-CM

## 2016-07-30 DIAGNOSIS — E1169 Type 2 diabetes mellitus with other specified complication: Secondary | ICD-10-CM | POA: Insufficient documentation

## 2016-07-30 NOTE — Assessment & Plan Note (Signed)
Tolerating statin, encouraged heart healthy diet, avoid trans fats, minimize simple carbs and saturated fats. Increase exercise as tolerated 

## 2016-07-30 NOTE — Assessment & Plan Note (Signed)
Well controlled, no changes to meds. Encouraged heart healthy diet such as the DASH diet and exercise as tolerated.  °

## 2016-08-01 ENCOUNTER — Other Ambulatory Visit: Payer: Self-pay | Admitting: Family Medicine

## 2016-08-01 DIAGNOSIS — E785 Hyperlipidemia, unspecified: Secondary | ICD-10-CM

## 2016-08-01 DIAGNOSIS — R739 Hyperglycemia, unspecified: Secondary | ICD-10-CM

## 2016-08-23 ENCOUNTER — Ambulatory Visit (INDEPENDENT_AMBULATORY_CARE_PROVIDER_SITE_OTHER): Payer: Medicare Other | Admitting: Psychiatry

## 2016-08-23 ENCOUNTER — Encounter (HOSPITAL_COMMUNITY): Payer: Self-pay | Admitting: Psychiatry

## 2016-08-23 VITALS — BP 124/72 | HR 85 | Resp 16 | Ht 64.0 in | Wt 190.0 lb

## 2016-08-23 DIAGNOSIS — G8929 Other chronic pain: Secondary | ICD-10-CM

## 2016-08-23 DIAGNOSIS — M549 Dorsalgia, unspecified: Secondary | ICD-10-CM

## 2016-08-23 DIAGNOSIS — F411 Generalized anxiety disorder: Secondary | ICD-10-CM | POA: Diagnosis not present

## 2016-08-23 DIAGNOSIS — G4700 Insomnia, unspecified: Secondary | ICD-10-CM

## 2016-08-23 DIAGNOSIS — F331 Major depressive disorder, recurrent, moderate: Secondary | ICD-10-CM | POA: Diagnosis not present

## 2016-08-23 MED ORDER — ESCITALOPRAM OXALATE 10 MG PO TABS: 10.0000 mg | ORAL_TABLET | Freq: Every day | ORAL | 0 refills | Status: DC

## 2016-08-23 MED ORDER — LAMOTRIGINE 150 MG PO TABS: ORAL_TABLET | ORAL | 0 refills | Status: DC

## 2016-08-23 MED ORDER — BUPROPION HCL ER (XL) 300 MG PO TB24: 300.0000 mg | ORAL_TABLET | Freq: Every day | ORAL | 0 refills | Status: DC

## 2016-08-23 MED ORDER — BUSPIRONE HCL 10 MG PO TABS: ORAL_TABLET | ORAL | 0 refills | Status: DC

## 2016-08-23 NOTE — Progress Notes (Signed)
Patient ID: [name], female   DOB: 02/28/1965, 51 y.o.   MRN: [id number]  Psychiatric Outpatient Follow up Visit  Patient Identification: Michelle Mueller MRN:  [id number] Date of Evaluation:  08/23/2016 Referral Source: [name] Chief Complaint:   Chief Complaint    Follow-up     Visit Diagnosis: MDD. GAD . Mood related to Chronic back pain Diagnosis:   Patient Active Problem List   Diagnosis Date Noted  . Hyperlipidemia associated with type 2 diabetes mellitus ([place]) [E11.69, E78.5] 07/30/2016  . Chest pain of uncertain etiology [P61.95] 12/21/2014  . Essential hypertension [I10] 12/21/2014  . Myalgia [M79.1] 11/10/2014  . Insomnia [G47.00] 11/10/2014  . UTI (urinary tract infection) [N39.0] 08/04/2013  . Fibromyalgia muscle pain [M79.7] 06/13/2013  . Severe depression ([place]) [F32.2] 06/13/2013  . DM (diabetes mellitus) type II uncontrolled, periph vascular disorder ([place]) [E11.51, E11.65] 06/13/2013  . Rheumatoid arthritis ([place]) [M06.9] 03/19/2013  . Fibromyalgia [M79.7] 03/19/2013  . Unspecified hypothyroidism [E03.9] 03/19/2013  . Goiter [E04.9] 03/19/2013  . Obesity (BMI 30-39.9) [E66.9] 03/19/2013  . 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 Family history of pancreatic cancer [Z80.0] 01/18/2011  . Chronic diarrhea [K52.9] 01/18/2011  . ACUTE PHARYNGITIS [J02.9] 10/24/2010   History of Present Illness:   Patient returns for follow up visit. Initially seen by [name] with the following " She initially presented as having  been off of psychiatric medications for approximately 7-8 years.  She had recurrence of depression and feeling of down. Pain exacerbated her depression and tiredness.  Patient has multiple medical conditions including relating to more social pain and arthritis. She was in a relationship but he has cancer he has stopped talking to her this is worrying her even more she's down depressed she feels anxiety is gone worse she's thinking  about him and his sickness. She still has pain that affects and causes more fatigue and myalgia Depression is worse. Not suicidal   Modifying factor: her kid Severity of depression: worse. 4/10. 10 being no depression  associated symptoms: back pain  Past Medical History:  Past Medical History:  Diagnosis Date  . Anxiety   . Arthritis   . Asthma    last attack 3 yrs ago  . Chicken pox   . Depression   . Diabetes mellitus without complication ([place])   . Fibromyalgia   . Goiter   . H/O hiatal hernia   . Hypertension    [name]  . Hypothyroidism   . Ovarian cyst   . PONV (postoperative nausea and vomiting)     Past Surgical History:  Procedure Laterality Date  . ABDOMINAL EXPOSURE  12/18/2011   Procedure: ABDOMINAL EXPOSURE;  Surgeon: [name], MD;  Location: [facility];  Service: Vascular;  Laterality: N/A;  Anterior Expossure for anterior lumbar interbody fuson  . ANTERIOR LUMBAR FUSION  12/18/2011   Procedure: ANTERIOR LUMBAR FUSION 1 LEVEL;  Surgeon: [name], MD;  Location: [facility];  Service: Neurosurgery;  Laterality: N/A;  Lumbar four-five Anterior Lumbar Interbody Fusion with Dr. [name] to do anterior exposure  . CESAREAN SECTION    . CHOLECYSTECTOMY    . HERNIA REPAIR    . LIPOMA EXCISION     L breast  . TONSILLECTOMY    . TUMOR EXCISION     Nerve sheath tumor, R arm   Family History:  Family History  Problem Relation Age of Onset  . Heart disease Father   . Lung cancer  Father   . Colon cancer Maternal Aunt   . Pancreatic cancer Maternal Grandmother   . Pancreatic cancer Maternal Grandfather   . Pancreatic cancer Paternal Grandmother   . Cancer Paternal Grandfather   . Sexual abuse Neg Hx    Social History:   Social History   Social History  . Marital status: Divorced    Spouse name: N/A  . Number of children: N/A  . Years of education: N/A   Social History Main Topics  . Smoking status: Never Smoker  . Smokeless tobacco: Never  Used  . Alcohol use No  . Drug use: No  . Sexual activity: No   Other Topics Concern  . None   Social History Narrative  . None      Psychiatric Specialty Exam:      Review of Systems  Constitutional: Negative for fever.  Cardiovascular: Negative for chest pain.  Neurological: Negative for tremors.  Psychiatric/Behavioral: Positive for depression. Negative for substance abuse and suicidal ideas. The patient is nervous/anxious. The patient does not have insomnia.     Blood pressure 124/72, pulse 85, resp. rate 16, height 5\' 4"  (1.626 m), weight 190 lb (86.2 kg), SpO2 98 %.Body mass index is 32.61 kg/m.  General Appearance: Well Groomed  Eye Contact:  Good  Speech:  Clear and Coherent  Volume:  Normal  Mood: depressed  Affect: congruent  Thought Process:  Coherent, Goal Directed, Intact, Linear and Logical  Orientation:  Full (Time, Place, and Person)  Thought Content:  WDL  Suicidal Thoughts:  No  Homicidal Thoughts:  No  Memory:  Immediate;   Good Recent;   Good Remote;   Good  Judgement:  Good  Insight:  Present  Psychomotor Activity:  Decreased  Concentration:  Fair  Recall:  Good  Fund of Knowledge:Good  Language: Good  Akathisia:  No  Handed:  Left  AIMS (if indicated):    Assets:  Communication Skills Desire for Improvement Financial Resources/Insurance Housing Resilience Transportation  ADL's:  Intact  Cognition: WNL  Sleep:  poor    Allergies:   Allergies  Allergen Reactions  . Erythromycin Other (See Comments)    arrhythmia  . Zocor  [Simvastatin-High Dose] Rash  . Doxycycline Other (See Comments)    Decreased BP and caused dizziness per patient   Current Medications: Current Outpatient Prescriptions  Medication Sig Dispense Refill  . atorvastatin (LIPITOR) 10 MG tablet TAKE 1 TABLET(10 MG) BY MOUTH DAILY 90 tablet 0  . buPROPion (WELLBUTRIN XL) 300 MG 24 hr tablet Take 1 tablet (300 mg total) by mouth daily. 90 tablet 0  . busPIRone  (BUSPAR) 10 MG tablet TAKE 1 TABLET(10 MG)  Three times a day 90 tablet 0  . Calcium-Magnesium-Vitamin D (CALCIUM MAGNESIUM PO) Take 1 tablet by mouth daily.    . cetirizine (ZYRTEC) 5 MG tablet Take 5 mg by mouth daily.    . cyclobenzaprine (FLEXERIL) 5 MG tablet   1  . estradiol (VIVELLE-DOT) 0.0375 MG/24HR Place 1 patch onto the skin 2 (two) times a week. 8 patch 12  . gabapentin (NEURONTIN) 300 MG capsule Take 3 capsules by mouth at bedtime.     [name] glucose blood (ONE TOUCH ULTRA TEST) test strip Test blood sugar once daily. Dx code: 250.00 100 each 12  . hydroxychloroquine (PLAQUENIL) 200 MG tablet Take 400 mg by mouth daily.   2  . lamoTRIgine (LAMICTAL) 150 MG tablet TAKE 1 TABLET(150 MG) BY MOUTH EVERY DAY 90 tablet 0  .  leflunomide (ARAVA) 20 MG tablet Take by mouth.    . levonorgestrel (MIRENA) 20 MCG/24HR IUD 1 each by Intrauterine route once.    [name] levothyroxine (SYNTHROID, LEVOTHROID) 50 MCG tablet TAKE 1 TABLET BY MOUTH EVERY DAY 90 tablet 0  . lisinopril (PRINIVIL,ZESTRIL) 5 MG tablet Take 1 tablet (5 mg total) by mouth daily. 90 tablet 0  . metFORMIN (GLUCOPHAGE-XR) 500 MG 24 hr tablet TAKE 1 TABLET BY MOUTH EVERY EVENING 90 tablet 1  . Multiple Vitamin (MULTIVITAMIN WITH MINERALS) TABS Take 1 tablet by mouth daily.    . naproxen sodium (ANAPROX) 220 MG tablet Take 220 mg by mouth 2 (two) times daily with a meal.    . naproxen sodium (ANAPROX) 220 MG tablet Take 220 mg by mouth.    Glory Rosebush DELICA LANCETS 62V MISC Test blood sugar once daily. Dx code: 250.00 100 each 12  . OVER THE COUNTER MEDICATION Vitamin B-12 3000 mcg 1 table daily.    [name] OVER THE COUNTER MEDICATION Amberen 2 tablets every morning    . oxyCODONE 10 MG TABS Take 1 tablet (10 mg total) by mouth every 6 (six) hours as needed (for pain). 60 tablet 0  . oxycodone-acetaminophen (LYNOX) 10-300 MG tablet Take 1 tablet by mouth 4 (four) times daily.    [name] Phenylephrine-Acetaminophen (TYLENOL SINUS CONGESTION/PAIN PO)  Take 2 tablets by mouth daily as needed (for sinus congestion).     . predniSONE (DELTASONE) 5 MG tablet Take 4 tabs (20 mg) for 4 days, 3 tabs (15 mg ) for 4 days, 2 tabs (10 mg) for 4 days, then 1 tab (5 mg) for 4 days and then stop.    . Vitamin D, Ergocalciferol, (DRISDOL) 50000 units CAPS capsule Take 1 capsule (50,000 Units total) by mouth every 7 (seven) days. 12 capsule 1  . escitalopram (LEXAPRO) 10 MG tablet Take 1 tablet (10 mg total) by mouth daily. 30 tablet 0   No current facility-administered medications for this visit.      Treatment Plan Summary: Medication management  1. Depression:: worse. Will stop prozac, start lexapro 10mg . Continue lamictal, wellbutrin 2. Anxiety: worse. Increase buspar to tid. Start lexapro as above  3. Insomnia: flucutates. Reviewed sleep hygiene  4. Back pain: fluctuates. Also on neurontin  Provided supportive therapy , she does not want to schedule for therapy. Reviewed concerns and questions addressed. Will follow early within few weeks. Call earlier if needed  Merian Capron, MD  10:15 AM 08/23/2016

## 2016-08-28 ENCOUNTER — Other Ambulatory Visit: Payer: Self-pay | Admitting: *Deleted

## 2016-08-28 DIAGNOSIS — N951 Menopausal and female climacteric states: Secondary | ICD-10-CM

## 2016-08-28 MED ORDER — ESTRADIOL 0.0375 MG/24HR TD PTTW: MEDICATED_PATCH | TRANSDERMAL | 12 refills | Status: DC

## 2016-08-28 NOTE — Telephone Encounter (Signed)
Pt called stating that her insurance is requiring her Vivelle Dot RX to read as follows: Disp 24 patches for a 63 day period.  Pt does use the patches BIW.  New RX was sent to [facility]

## 2016-09-06 ENCOUNTER — Ambulatory Visit (HOSPITAL_COMMUNITY): Payer: Self-pay | Admitting: Psychiatry

## 2016-09-14 ENCOUNTER — Encounter (HOSPITAL_COMMUNITY): Payer: Self-pay | Admitting: Psychiatry

## 2016-09-14 ENCOUNTER — Ambulatory Visit (INDEPENDENT_AMBULATORY_CARE_PROVIDER_SITE_OTHER): Payer: Medicare Other | Admitting: Psychiatry

## 2016-09-14 VITALS — BP 120/72 | HR 84 | Resp 16 | Ht 64.0 in | Wt 189.4 lb

## 2016-09-14 DIAGNOSIS — G8929 Other chronic pain: Secondary | ICD-10-CM | POA: Diagnosis not present

## 2016-09-14 DIAGNOSIS — F411 Generalized anxiety disorder: Secondary | ICD-10-CM

## 2016-09-14 DIAGNOSIS — F331 Major depressive disorder, recurrent, moderate: Secondary | ICD-10-CM

## 2016-09-14 DIAGNOSIS — M549 Dorsalgia, unspecified: Secondary | ICD-10-CM | POA: Diagnosis not present

## 2016-09-14 MED ORDER — ESCITALOPRAM OXALATE 10 MG PO TABS: 15.0000 mg | ORAL_TABLET | Freq: Every day | ORAL | 1 refills | Status: DC
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
## 2016-09-18 DIAGNOSIS — M0609 Rheumatoid arthritis without rheumatoid factor, multiple sites: Secondary | ICD-10-CM | POA: Diagnosis not present

## 2016-09-18 DIAGNOSIS — Z23 Encounter for immunization: Secondary | ICD-10-CM | POA: Diagnosis not present

## 2016-09-18 DIAGNOSIS — M25511 Pain in right shoulder: Secondary | ICD-10-CM | POA: Diagnosis not present

## 2016-09-18 DIAGNOSIS — Z79899 Other long term (current) drug therapy: Secondary | ICD-10-CM | POA: Diagnosis not present

## 2016-09-18 DIAGNOSIS — G8929 Other chronic pain: Secondary | ICD-10-CM | POA: Diagnosis not present

## 2016-09-20 ENCOUNTER — Telehealth (HOSPITAL_COMMUNITY): Payer: Self-pay | Admitting: Psychiatry

## 2016-09-20 MED ORDER — ESCITALOPRAM OXALATE 10 MG PO TABS: 15.0000 mg | ORAL_TABLET | Freq: Every day | ORAL | 0 refills | Status: DC

## 2016-09-20 NOTE — Telephone Encounter (Signed)
Pt needs refill on lexapro [facility] [place]

## 2016-09-20 NOTE — Telephone Encounter (Signed)
Prescription sent

## 2016-09-22 ENCOUNTER — Other Ambulatory Visit (HOSPITAL_COMMUNITY): Payer: Self-pay | Admitting: Psychiatry

## 2016-09-25 ENCOUNTER — Telehealth (HOSPITAL_COMMUNITY): Payer: Self-pay | Admitting: Psychiatry

## 2016-09-25 NOTE — Telephone Encounter (Signed)
Pt needs prior auth for lexapro rx. Pt will need this before Wednesday since she is going out of town.

## 2016-09-26 NOTE — Telephone Encounter (Signed)
Prior auth received for Lexapro. Prior auth submitted through [employer]. PA Key- [id number]. Awaiting decision 24-72 hours. Notified pt.

## 2016-09-28 NOTE — Telephone Encounter (Signed)
Medication refill- received fax from [facility] requesting a refill for Lexapro. Per Dr. De Nurse, refill request is denied. Lexapro rx was sent to pharmacy on 09/20/16. Nothing further is need at this time.

## 2016-09-28 NOTE — Telephone Encounter (Signed)
PA for Lexapro 10mg  was approved 08/27/16-09/26/19 Case [id number] Notified Pharmacy.

## 2016-10-03 DIAGNOSIS — M961 Postlaminectomy syndrome, not elsewhere classified: Secondary | ICD-10-CM | POA: Diagnosis not present

## 2016-10-03 DIAGNOSIS — M545 Low back pain: Secondary | ICD-10-CM | POA: Diagnosis not present

## 2016-10-03 DIAGNOSIS — M0600 Rheumatoid arthritis without rheumatoid factor, unspecified site: Secondary | ICD-10-CM | POA: Diagnosis not present

## 2016-10-06 DIAGNOSIS — M0609 Rheumatoid arthritis without rheumatoid factor, multiple sites: Secondary | ICD-10-CM | POA: Diagnosis not present

## 2016-10-06 DIAGNOSIS — Z79899 Other long term (current) drug therapy: Secondary | ICD-10-CM | POA: Diagnosis not present

## 2016-10-07 ENCOUNTER — Other Ambulatory Visit: Payer: Self-pay | Admitting: Family Medicine

## 2016-10-07 DIAGNOSIS — I10 Essential (primary) hypertension: Secondary | ICD-10-CM

## 2016-10-10 ENCOUNTER — Telehealth (HOSPITAL_COMMUNITY): Payer: Self-pay | Admitting: Psychiatry

## 2016-10-10 NOTE — Telephone Encounter (Signed)
Pt needs refill on  Lamotrigine 90 day supply Buspirone 90 day supply  [facility]

## 2016-10-11 MED ORDER — BUSPIRONE HCL 10 MG PO TABS: ORAL_TABLET | ORAL | 0 refills | Status: DC

## 2016-10-11 NOTE — Telephone Encounter (Signed)
Medication refill- pt request 90 day supply for Lamictal and Buspar. Lamictal rx was last filled on 07/21/16 for a 90 day supply. Buspar rx was last filled on 08/28/16 for a 30 day supply.  Called and spoke with Elmyra Ricks at [facility].  Pt has a refill of file for Lamictal. Pt will need a new rx for Buspar.  Per Dr. De Nurse, refill is authorize for Buspar 10mg , #90. Rx was escribed to pharmacy. Pt's next apt is schedule on 11/09/16. Called and informed pt of refill status. Pt shows understanding.

## 2016-10-27 ENCOUNTER — Other Ambulatory Visit: Payer: Self-pay | Admitting: Family Medicine

## 2016-10-27 ENCOUNTER — Other Ambulatory Visit (HOSPITAL_COMMUNITY): Payer: Self-pay | Admitting: Psychiatry

## 2016-10-27 DIAGNOSIS — E785 Hyperlipidemia, unspecified: Secondary | ICD-10-CM

## 2016-10-27 DIAGNOSIS — E039 Hypothyroidism, unspecified: Secondary | ICD-10-CM

## 2016-10-29 ENCOUNTER — Other Ambulatory Visit (HOSPITAL_COMMUNITY): Payer: Self-pay | Admitting: Psychiatry

## 2016-11-05 ENCOUNTER — Other Ambulatory Visit: Payer: Self-pay | Admitting: Family Medicine

## 2016-11-05 DIAGNOSIS — E1165 Type 2 diabetes mellitus with hyperglycemia: Principal | ICD-10-CM

## 2016-11-05 DIAGNOSIS — IMO0001 Reserved for inherently not codable concepts without codable children: Secondary | ICD-10-CM

## 2016-11-05 DIAGNOSIS — E119 Type 2 diabetes mellitus without complications: Secondary | ICD-10-CM

## 2016-11-05 NOTE — Telephone Encounter (Signed)
Medication refill- received fax from [facility] requesting a refill for Wellbutrin. Per Dr. De Nurse, refill is authorize for Wellbutrin 300mg , #90. Rx was sent to pharmacy. Pt's next apt is schedule on 11/09/16. Lvm informing pt of refill status.

## 2016-11-05 NOTE — Telephone Encounter (Signed)
Medication refill- received fax from [facility] requesting a refill for Lexapro. Per Dr. De Nurse, refill is authorize for Lexapro 10mg , #45. Rx was sent to pharmacy. Pt's next apt is schedule on 11/09/16. Lvm informing pt of refill status.

## 2016-11-07 DIAGNOSIS — Z79899 Other long term (current) drug therapy: Secondary | ICD-10-CM | POA: Diagnosis not present

## 2016-11-07 DIAGNOSIS — M0609 Rheumatoid arthritis without rheumatoid factor, multiple sites: Secondary | ICD-10-CM | POA: Diagnosis not present

## 2016-11-09 ENCOUNTER — Encounter (HOSPITAL_COMMUNITY): Payer: Self-pay | Admitting: Psychiatry

## 2016-11-09 ENCOUNTER — Ambulatory Visit (INDEPENDENT_AMBULATORY_CARE_PROVIDER_SITE_OTHER): Payer: Medicare Other | Admitting: Psychiatry

## 2016-11-09 DIAGNOSIS — Z79899 Other long term (current) drug therapy: Secondary | ICD-10-CM | POA: Diagnosis not present

## 2016-11-09 DIAGNOSIS — F0630 Mood disorder due to known physiological condition, unspecified: Secondary | ICD-10-CM

## 2016-11-09 DIAGNOSIS — M1990 Unspecified osteoarthritis, unspecified site: Secondary | ICD-10-CM

## 2016-11-09 DIAGNOSIS — Z598 Other problems related to housing and economic circumstances: Secondary | ICD-10-CM

## 2016-11-09 DIAGNOSIS — G8929 Other chronic pain: Secondary | ICD-10-CM | POA: Diagnosis not present

## 2016-11-09 DIAGNOSIS — G4700 Insomnia, unspecified: Secondary | ICD-10-CM

## 2016-11-09 DIAGNOSIS — F329 Major depressive disorder, single episode, unspecified: Secondary | ICD-10-CM | POA: Diagnosis not present

## 2016-11-09 DIAGNOSIS — M549 Dorsalgia, unspecified: Secondary | ICD-10-CM

## 2016-11-09 DIAGNOSIS — F411 Generalized anxiety disorder: Secondary | ICD-10-CM | POA: Diagnosis not present

## 2016-11-09 DIAGNOSIS — F331 Major depressive disorder, recurrent, moderate: Secondary | ICD-10-CM

## 2016-11-09 MED ORDER — LAMOTRIGINE 150 MG PO TABS: ORAL_TABLET | ORAL | 0 refills | Status: DC
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
## 2016-12-20 DIAGNOSIS — Z79899 Other long term (current) drug therapy: Secondary | ICD-10-CM | POA: Diagnosis not present

## 2016-12-20 DIAGNOSIS — Z5181 Encounter for therapeutic drug level monitoring: Secondary | ICD-10-CM | POA: Diagnosis not present

## 2016-12-20 DIAGNOSIS — M0609 Rheumatoid arthritis without rheumatoid factor, multiple sites: Secondary | ICD-10-CM | POA: Diagnosis not present

## 2017-01-01 ENCOUNTER — Other Ambulatory Visit: Payer: Self-pay | Admitting: Family Medicine

## 2017-01-01 DIAGNOSIS — I10 Essential (primary) hypertension: Secondary | ICD-10-CM

## 2017-01-02 DIAGNOSIS — I10 Essential (primary) hypertension: Secondary | ICD-10-CM | POA: Diagnosis not present

## 2017-01-02 DIAGNOSIS — M0600 Rheumatoid arthritis without rheumatoid factor, unspecified site: Secondary | ICD-10-CM | POA: Diagnosis not present

## 2017-01-02 DIAGNOSIS — M961 Postlaminectomy syndrome, not elsewhere classified: Secondary | ICD-10-CM | POA: Diagnosis not present

## 2017-01-02 DIAGNOSIS — Z6832 Body mass index (BMI) 32.0-32.9, adult: Secondary | ICD-10-CM | POA: Diagnosis not present

## 2017-01-08 ENCOUNTER — Other Ambulatory Visit: Payer: Self-pay

## 2017-01-09 ENCOUNTER — Other Ambulatory Visit (HOSPITAL_COMMUNITY): Payer: Self-pay | Admitting: Psychiatry

## 2017-01-15 ENCOUNTER — Other Ambulatory Visit: Payer: Self-pay

## 2017-01-15 ENCOUNTER — Encounter (HOSPITAL_COMMUNITY): Payer: Self-pay | Admitting: Psychiatry

## 2017-01-15 ENCOUNTER — Ambulatory Visit (INDEPENDENT_AMBULATORY_CARE_PROVIDER_SITE_OTHER): Payer: Medicare Other | Admitting: Psychiatry

## 2017-01-15 VITALS — BP 126/90 | HR 96 | Ht 64.0 in | Wt 196.0 lb

## 2017-01-15 DIAGNOSIS — F411 Generalized anxiety disorder: Secondary | ICD-10-CM

## 2017-01-15 DIAGNOSIS — M549 Dorsalgia, unspecified: Secondary | ICD-10-CM | POA: Diagnosis not present

## 2017-01-15 DIAGNOSIS — G4700 Insomnia, unspecified: Secondary | ICD-10-CM | POA: Diagnosis not present

## 2017-01-15 DIAGNOSIS — F331 Major depressive disorder, recurrent, moderate: Secondary | ICD-10-CM

## 2017-01-15 DIAGNOSIS — Z975 Presence of (intrauterine) contraceptive device: Secondary | ICD-10-CM

## 2017-01-15 DIAGNOSIS — G8929 Other chronic pain: Secondary | ICD-10-CM | POA: Diagnosis not present

## 2017-01-15 MED ORDER — BUPROPION HCL ER (XL) 300 MG PO TB24: ORAL_TABLET | ORAL | 0 refills | Status: DC

## 2017-01-15 MED ORDER — BUSPIRONE HCL 10 MG PO TABS: ORAL_TABLET | ORAL | 0 refills | Status: DC

## 2017-01-15 MED ORDER — LAMOTRIGINE 200 MG PO TABS: ORAL_TABLET | ORAL | Status: DC

## 2017-01-15 NOTE — Progress Notes (Signed)
Patient ID: Michelle Mueller, female   DOB: May 08, 1965, 51 y.o.   MRN: [id number]  Psychiatric Outpatient Follow up Visit  Patient Identification: Michelle Mueller MRN:  [id number] Date of Evaluation:  01/15/2017 Referral Source: [name] Chief Complaint:   Chief Complaint    Follow-up; Other     Visit Diagnosis: MDD. GAD . Mood related to Chronic back pain Diagnosis:   Patient Active Problem List   Diagnosis Date Noted  . Hyperlipidemia associated with type 2 diabetes mellitus ([place]) [E11.69, E78.5] 07/30/2016  . Chest pain of uncertain etiology [J50.09] 12/21/2014  . Essential hypertension [I10] 12/21/2014  . Myalgia [M79.10] 11/10/2014  . Insomnia [G47.00] 11/10/2014  . UTI (urinary tract infection) [N39.0] 08/04/2013  . Fibromyalgia muscle pain [M79.7] 06/13/2013  . Severe depression ([place]) [F32.2] 06/13/2013  . DM (diabetes mellitus) type II uncontrolled, periph vascular disorder ([place]) [E11.51, E11.65] 06/13/2013  . Rheumatoid arthritis ([place]) [M06.9] 03/19/2013  . Fibromyalgia [M79.7] 03/19/2013  . Unspecified hypothyroidism [E03.9] 03/19/2013  . Goiter [E04.9] 03/19/2013  . Obesity (BMI 30-39.9) [E66.9] 03/19/2013  . 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 Family history of pancreatic cancer [Z80.0] 01/18/2011  . Chronic diarrhea [K52.9] 01/18/2011  . ACUTE PHARYNGITIS [J02.9] 10/24/2010   History of Present Illness:   Patient returns for follow up visit. Initially seen by [name] with the following " She initially presented as having  been off of psychiatric medications for approximately 7-8 years.  She had recurrence of depression and feeling of down. Pain exacerbated her depression and tiredness.  RA pain effects mood . Her last med didn't help. Says providers will try something different  feels low amotivated, depressed    No rash Modifying factor: kids Severity of depression: gone worse. 5/10 Timing: pain related  associated symptoms: back  pain  Past Medical History:  Past Medical History:  Diagnosis Date  . Anxiety   . Arthritis   . Asthma    last attack 3 yrs ago  . Chicken pox   . Depression   . Diabetes mellitus without complication ([place])   . Fibromyalgia   . Goiter   . H/O hiatal hernia   . Hypertension    [name]  . Hypothyroidism   . Ovarian cyst   . PONV (postoperative nausea and vomiting)     Past Surgical History:  Procedure Laterality Date  . ABDOMINAL EXPOSURE  12/18/2011   Procedure: ABDOMINAL EXPOSURE;  Surgeon: [name], MD;  Location: [facility];  Service: Vascular;  Laterality: N/A;  Anterior Expossure for anterior lumbar interbody fuson  . ANTERIOR LUMBAR FUSION  12/18/2011   Procedure: ANTERIOR LUMBAR FUSION 1 LEVEL;  Surgeon: [name], MD;  Location: [place] [facility];  Service: Neurosurgery;  Laterality: N/A;  Lumbar four-five Anterior Lumbar Interbody Fusion with Dr. [name] to do anterior exposure  . CESAREAN SECTION    . CHOLECYSTECTOMY    . HERNIA REPAIR    . LIPOMA EXCISION     L breast  . TONSILLECTOMY    . TUMOR EXCISION     Nerve sheath tumor, R arm   Family History:  Family History  Problem Relation Age of Onset  . Heart disease Father   . Lung cancer Father   . Colon cancer Maternal Aunt   . Pancreatic cancer Maternal Grandmother   . Pancreatic cancer Maternal Grandfather   . Pancreatic cancer Paternal Grandmother   . Cancer Paternal Grandfather   . Sexual abuse Neg Hx  Social History:   Social History   Socioeconomic History  . Marital status: Divorced    Spouse name: None  . Number of children: None  . Years of education: None  . Highest education level: None  Social Needs  . Financial resource strain: None  . Food insecurity - worry: None  . Food insecurity - inability: None  . Transportation needs - medical: None  . Transportation needs - non-medical: None  Occupational History  . None  Tobacco Use  . Smoking status: Never Smoker  . Smokeless  tobacco: Never Used  Substance and Sexual Activity  . Alcohol use: No    Alcohol/week: 0.0 oz  . Drug use: No  . Sexual activity: No    Birth control/protection: IUD, None  Other Topics Concern  . None  Social History Narrative  . None      Psychiatric Specialty Exam:      Review of Systems  Constitutional: Negative for fever.  Cardiovascular: Negative for chest pain.  Neurological: Negative for tremors.  Psychiatric/Behavioral: Negative for substance abuse and suicidal ideas. The patient does not have insomnia.     Blood pressure 126/90, pulse 96, height 5\' 4"  (1.626 m), weight 196 lb (88.9 kg).Body mass index is 33.64 kg/m.  General Appearance: Well Groomed  Eye Contact:  Good  Speech:  Clear and Coherent  Volume:  Normal  Mood: dysphoric  Affect: congruent  Thought Process:  Coherent, Goal Directed, Intact, Linear and Logical  Orientation:  Full (Time, Place, and Person)  Thought Content:  WDL  Suicidal Thoughts:  No  Homicidal Thoughts:  No  Memory:  Immediate;   Good Recent;   Good Remote;   Good  Judgement:  Good  Insight:  Present  Psychomotor Activity:  Decreased  Concentration:  Fair  Recall:  Good  Fund of Knowledge:Good  Language: Good  Akathisia:  No  Handed:  Left  AIMS (if indicated):    Assets:  Communication Skills Desire for Improvement Financial Resources/Insurance Housing Resilience Transportation  ADL's:  Intact  Cognition: WNL  Sleep:  poor    Allergies:   Allergies  Allergen Reactions  . Erythromycin Other (See Comments)    arrhythmia  . Zocor  [Simvastatin-High Dose] Rash  . Doxycycline Other (See Comments)    Decreased BP and caused dizziness per patient   Current Medications: Current Outpatient Medications  Medication Sig Dispense Refill  . atorvastatin (LIPITOR) 10 MG tablet TAKE 1 TABLET(10 MG) BY MOUTH DAILY 90 tablet 0  . buPROPion (WELLBUTRIN XL) 300 MG 24 hr tablet TAKE 1 TABLET(300 MG) BY MOUTH DAILY 90 tablet  0  . busPIRone (BUSPAR) 10 MG tablet TAKE 1 TABLET(10 MG)  Three times a day 270 tablet 0  . Calcium-Magnesium-Vitamin D (CALCIUM MAGNESIUM PO) Take 1 tablet by mouth daily.    . cetirizine (ZYRTEC) 5 MG tablet Take 5 mg by mouth daily.    . cyclobenzaprine (FLEXERIL) 5 MG tablet   1  . estradiol (VIVELLE-DOT) 0.0375 MG/24HR Use 1 patch twice weekly.  Disp 24 patches for a 63 day period. 24 patch 12  . gabapentin (NEURONTIN) 300 MG capsule Take 3 capsules by mouth at bedtime.     [name] glucose blood (ONE TOUCH ULTRA TEST) test strip Test blood sugar once daily. Dx code: 250.00 100 each 12  . hydroxychloroquine (PLAQUENIL) 200 MG tablet Take 400 mg by mouth daily.   2  . lamoTRIgine (LAMICTAL) 200 MG tablet Can take one and half of 150mg   for now till finished those pills 30 tablet   . leflunomide (ARAVA) 20 MG tablet Take by mouth.    . levonorgestrel (MIRENA) 20 MCG/24HR IUD 1 each by Intrauterine route once.    [name] levothyroxine (SYNTHROID, LEVOTHROID) 50 MCG tablet TAKE 1 TABLET BY MOUTH EVERY DAY 90 tablet 0  . lisinopril (PRINIVIL,ZESTRIL) 5 MG tablet TAKE 1 TABLET BY MOUTH EVERY DAY 90 tablet 0  . metFORMIN (GLUCOPHAGE-XR) 500 MG 24 hr tablet TAKE 1 TABLET BY MOUTH EVERY EVENING 90 tablet 0  . Multiple Vitamin (MULTIVITAMIN WITH MINERALS) TABS Take 1 tablet by mouth daily.    . naproxen sodium (ANAPROX) 220 MG tablet Take 220 mg by mouth.    Glory Rosebush DELICA LANCETS 27C MISC Test blood sugar once daily. Dx code: 250.00 100 each 12  . OVER THE COUNTER MEDICATION Vitamin B-12 3000 mcg 1 table daily.    [name] oxyCODONE 10 MG TABS Take 1 tablet (10 mg total) by mouth every 6 (six) hours as needed (for pain). 60 tablet 0  . Phenylephrine-Acetaminophen (TYLENOL SINUS CONGESTION/PAIN PO) Take 2 tablets by mouth daily as needed (for sinus congestion).     . predniSONE (DELTASONE) 5 MG tablet Take 4 tabs (20 mg) for 4 days, 3 tabs (15 mg ) for 4 days, 2 tabs (10 mg) for 4 days, then 1 tab (5 mg) for 4 days  and then stop.    . Vitamin D, Ergocalciferol, (DRISDOL) 50000 units CAPS capsule Take 1 capsule (50,000 Units total) by mouth every 7 (seven) days. 12 capsule 1  . lisinopril (PRINIVIL,ZESTRIL) 5 MG tablet Take 1 tablet (5 mg total) by mouth daily. (Patient not taking: Reported on 01/15/2017) 90 tablet 0  . OVER THE COUNTER MEDICATION Amberen 2 tablets every morning    . oxycodone-acetaminophen (LYNOX) 10-300 MG tablet Take 1 tablet by mouth 4 (four) times daily.     No current facility-administered medications for this visit.      Treatment Plan Summary: Medication management  1. Depression:: worse. Increase lamictal to 200mg . Can take one and half of 150mg  if can use. Continue wellbutrin Provided supportive therapy and to work on having daily goals to spend day and not lie in bed Reviewed side effects 2. Anxiety:fluctuates. Continue buspar 3. Insomnia: fluctuates. Reviewed sleep hgygiene 4. Back pain: chronic , keep gabapentin. Has used cymbalta didn't work or had reaction  Fu 58month.    Merian Capron, MD  3:47 PM 01/15/2017

## 2017-01-17 ENCOUNTER — Other Ambulatory Visit: Payer: Self-pay | Admitting: Family Medicine

## 2017-01-17 ENCOUNTER — Ambulatory Visit
Admission: RE | Admit: 2017-01-17 | Discharge: 2017-01-17 | Disposition: A | Discharge status: Home / Self Care | Payer: BLUE CROSS/BLUE SHIELD | Source: Ambulatory Visit | Attending: Family Medicine | Admitting: Family Medicine

## 2017-01-17 DIAGNOSIS — N651 Disproportion of reconstructed breast: Secondary | ICD-10-CM | POA: Diagnosis not present

## 2017-01-17 DIAGNOSIS — R928 Other abnormal and inconclusive findings on diagnostic imaging of breast: Secondary | ICD-10-CM | POA: Diagnosis not present

## 2017-01-17 DIAGNOSIS — N63 Unspecified lump in unspecified breast: Secondary | ICD-10-CM

## 2017-01-17 DIAGNOSIS — N6489 Other specified disorders of breast: Secondary | ICD-10-CM

## 2017-01-18 ENCOUNTER — Ambulatory Visit (INDEPENDENT_AMBULATORY_CARE_PROVIDER_SITE_OTHER): Payer: Medicare Other | Admitting: Family Medicine

## 2017-01-18 ENCOUNTER — Encounter: Payer: Self-pay | Admitting: Family Medicine

## 2017-01-18 ENCOUNTER — Other Ambulatory Visit (HOSPITAL_COMMUNITY)
Admission: RE | Admit: 2017-01-18 | Discharge: 2017-01-18 | Disposition: A | Discharge status: Home / Self Care | Payer: Medicare Other | Source: Ambulatory Visit | Attending: Family Medicine | Admitting: Family Medicine

## 2017-01-18 VITALS — BP 106/80 | HR 81 | Temp 98.2°F | Resp 16 | Ht 64.0 in | Wt 197.2 lb

## 2017-01-18 DIAGNOSIS — E785 Hyperlipidemia, unspecified: Secondary | ICD-10-CM

## 2017-01-18 DIAGNOSIS — E039 Hypothyroidism, unspecified: Secondary | ICD-10-CM

## 2017-01-18 DIAGNOSIS — E1169 Type 2 diabetes mellitus with other specified complication: Secondary | ICD-10-CM

## 2017-01-18 DIAGNOSIS — Z23 Encounter for immunization: Secondary | ICD-10-CM

## 2017-01-18 DIAGNOSIS — E1151 Type 2 diabetes mellitus with diabetic peripheral angiopathy without gangrene: Secondary | ICD-10-CM

## 2017-01-18 DIAGNOSIS — L719 Rosacea, unspecified: Secondary | ICD-10-CM | POA: Diagnosis not present

## 2017-01-18 DIAGNOSIS — Z202 Contact with and (suspected) exposure to infections with a predominantly sexual mode of transmission: Secondary | ICD-10-CM

## 2017-01-18 DIAGNOSIS — I10 Essential (primary) hypertension: Secondary | ICD-10-CM | POA: Diagnosis not present

## 2017-01-18 DIAGNOSIS — Z9229 Personal history of other drug therapy: Secondary | ICD-10-CM | POA: Diagnosis not present

## 2017-01-18 MED ORDER — METRONIDAZOLE 0.75 % EX LOTN: 1.0000 "application " | TOPICAL_LOTION | Freq: Every day | CUTANEOUS | 2 refills | Status: DC

## 2017-01-18 NOTE — Patient Instructions (Signed)

## 2017-01-18 NOTE — Progress Notes (Signed)
Patient ID: Michelle Mueller, female    DOB: [birth year]  Age: 51 y.o. MRN: [id number]    Subjective:  Subjective  HPI [name] presents for f/u dm, cholesterol and htn Pt also c/o rash on her face -- she thinks she may have rosecea She also c/o sore on her L breast that keeps coming back.   HPI HYPERTENSION   Blood pressure range-not checking   Chest pain- no      Dyspnea- no Lightheadedness- no   Edema- yes some  Other side effects -  no   Medication compliance: good Low salt diet- yes     DIABETES    Blood Sugar ranges-good per pr   Polyuria- no New Visual problems- no  Hypoglycemic symptoms- no  Other side effects-no Medication compliance - good Last eye exam- next app next week Foot exam- today   HYPERLIPIDEMIA  Medication compliance- good RUQ pain- no  Muscle aches- no Other side effects-no   Review of Systems  Constitutional: Negative for activity change, appetite change, fatigue and unexpected weight change.  Respiratory: Negative for cough and shortness of breath.   Cardiovascular: Negative for chest pain and palpitations.  Skin: Positive for rash.  Psychiatric/Behavioral: Negative for behavioral problems and dysphoric mood. The patient is not nervous/anxious.     History Past Medical History:  Diagnosis Date  . Anxiety   . Arthritis   . Asthma    last attack 3 yrs ago  . Chicken pox   . Depression   . Diabetes mellitus without complication ([place])   . Fibromyalgia   . Goiter   . H/O hiatal hernia   . Hypertension    [name]  . Hypothyroidism   . Ovarian cyst   . PONV (postoperative nausea and vomiting)     She has a past surgical history that includes Cholecystectomy; Cesarean section; Tonsillectomy; Hernia repair; Lipoma excision; Tumor excision; Anterior lumbar fusion (12/18/2011); and Abdominal exposure (12/18/2011).   Her family history includes Cancer in her paternal grandfather; Colon cancer in her maternal aunt; Heart disease in her  father; Lung cancer in her father; Pancreatic cancer in her maternal grandfather, maternal grandmother, and paternal grandmother.She reports that  has never smoked. she has never used smokeless tobacco. She reports that she does not drink alcohol or use drugs.  Current Outpatient Medications on File Prior to Visit  Medication Sig Dispense Refill  . atorvastatin (LIPITOR) 10 MG tablet TAKE 1 TABLET(10 MG) BY MOUTH DAILY 90 tablet 0  . buPROPion (WELLBUTRIN XL) 300 MG 24 hr tablet TAKE 1 TABLET(300 MG) BY MOUTH DAILY 90 tablet 0  . busPIRone (BUSPAR) 10 MG tablet TAKE 1 TABLET(10 MG)  Three times a day 270 tablet 0  . Calcium-Magnesium-Vitamin D (CALCIUM MAGNESIUM PO) Take 1 tablet by mouth daily.    . cetirizine (ZYRTEC) 5 MG tablet Take 5 mg by mouth daily.    . Cholecalciferol (VITAMIN D) 2000 units CAPS Take 1 capsule by mouth daily.    . cyclobenzaprine (FLEXERIL) 5 MG tablet   1  . estradiol (VIVELLE-DOT) 0.0375 MG/24HR Use 1 patch twice weekly.  Disp 24 patches for a 63 day period. 24 patch 12  . gabapentin (NEURONTIN) 300 MG capsule Take 3 capsules by mouth at bedtime.     [name] glucose blood (ONE TOUCH ULTRA TEST) test strip Test blood sugar once daily. Dx code: 250.00 100 each 12  . hydroxychloroquine (PLAQUENIL) 200 MG tablet Take 400 mg by mouth daily.  2  . lamoTRIgine (LAMICTAL) 200 MG tablet Can take one and half of 150mg  for now till finished those pills 30 tablet   . leflunomide (ARAVA) 20 MG tablet Take by mouth.    . levonorgestrel (MIRENA) 20 MCG/24HR IUD 1 each by Intrauterine route once.    [name] levothyroxine (SYNTHROID, LEVOTHROID) 50 MCG tablet TAKE 1 TABLET BY MOUTH EVERY DAY 90 tablet 0  . lisinopril (PRINIVIL,ZESTRIL) 5 MG tablet Take 1 tablet (5 mg total) by mouth daily. 90 tablet 0  . lisinopril (PRINIVIL,ZESTRIL) 5 MG tablet TAKE 1 TABLET BY MOUTH EVERY DAY 90 tablet 0  . metFORMIN (GLUCOPHAGE-XR) 500 MG 24 hr tablet TAKE 1 TABLET BY MOUTH EVERY EVENING 90 tablet 0  .  Multiple Vitamin (MULTIVITAMIN WITH MINERALS) TABS Take 1 tablet by mouth daily.    . naproxen sodium (ANAPROX) 220 MG tablet Take 220 mg by mouth.    Glory Rosebush DELICA LANCETS 36U MISC Test blood sugar once daily. Dx code: 250.00 100 each 12  . OVER THE COUNTER MEDICATION Vitamin B-12 3000 mcg 1 table daily.    [name] OVER THE COUNTER MEDICATION Amberen 2 tablets every morning    . oxyCODONE 10 MG TABS Take 1 tablet (10 mg total) by mouth every 6 (six) hours as needed (for pain). 60 tablet 0  . Phenylephrine-Acetaminophen (TYLENOL SINUS CONGESTION/PAIN PO) Take 2 tablets by mouth daily as needed (for sinus congestion).     . [DISCONTINUED] escitalopram (LEXAPRO) 10 MG tablet TAKE 1 AND 1/2 TABLETS(15 MG) BY MOUTH DAILY 45 tablet 0  . [DISCONTINUED] FLUoxetine (PROZAC) 20 MG tablet Take 1 tablet (20 mg total) by mouth daily. 90 tablet 0   No current facility-administered medications on file prior to visit.      Objective:  Objective  Physical Exam  Constitutional: She is oriented to person, place, and time. She appears well-developed and well-nourished.  HENT:  Head: Normocephalic and atraumatic.    Eyes: Conjunctivae and EOM are normal.  Neck: Normal range of motion. Neck supple. No JVD present. Carotid bruit is not present. No thyromegaly present.  Cardiovascular: Normal rate, regular rhythm and normal heart sounds.  No murmur heard. Pulmonary/Chest: Effort normal and breath sounds normal. No respiratory distress. She has no wheezes. She has no rales. She exhibits no tenderness.  Musculoskeletal: She exhibits no edema.  Neurological: She is alert and oriented to person, place, and time.  Skin: There is erythema.  Psychiatric: She has a normal mood and affect.  Nursing note and vitals reviewed.  BP 106/80 (BP Location: Right Arm, Cuff Size: Normal)   Pulse 81   Temp 98.2 F (36.8 C) (Oral)   Resp 16   Ht 5\' 4"  (1.626 m)   Wt 197 lb 3.2 oz (89.4 kg)   SpO2 96%   BMI 33.85 kg/m    Wt Readings from Last 3 Encounters:  01/18/17 197 lb 3.2 oz (89.4 kg)  07/28/16 192 lb 12.8 oz (87.5 kg)  07/10/16 193 lb (87.5 kg)     Lab Results  Component Value Date   WBC 6.9 10/14/2015   HGB 13.1 10/14/2015   HCT 38.5 10/14/2015   PLT 270.0 10/14/2015   GLUCOSE 71 07/28/2016   CHOL 152 07/28/2016   TRIG 96 07/28/2016   HDL 88 07/28/2016   LDLCALC 45 07/28/2016   ALT 19 07/28/2016   AST 18 07/28/2016   NA 141 07/28/2016   K 3.8 07/28/2016   CL 105 07/28/2016   CREATININE 1.07  07/28/2016   BUN 13 07/28/2016   CO2 22 07/28/2016   TSH 1.11 01/28/2016   HGBA1C 4.7 07/28/2016   MICROALBUR 0.5 08/28/2013    US Breast Ltd Uni Left Inc Axilla  Result Date: 01/17/2017 CLINICAL DATA:  One year interval follow-up of a likely benign focal asymmetry in the upper left breast at far posterior depth, felt to represent overlapping fibroglandular tissue and a normal low axillary lymph node. A hypoechoic focus in the upper inner left breast on ultrasound is being followed which may correspond to the focal asymmetry. Annual evaluation, right breast. Patient states she had a benign lipoma removed from what she believed was the left breast many years ago while in [place] though she is unsure. EXAM: 2D DIGITAL DIAGNOSTIC BILATERAL MAMMOGRAM WITH CAD AND ADJUNCT TOMO ULTRASOUND LEFT BREAST COMPARISON:  Mammography 07/06/2016 (left), 12/20/2015 (left), 12/13/2015 (bilateral) and earlier. Left breast ultrasound 07/06/2016, 12/20/2015. ACR Breast Density Category b: There are scattered areas of fibroglandular density. FINDINGS: Standard 2D and tomosynthesis full field CC and MLO views of both breasts were obtained. A standard and tomosynthesis spot-compression CC and MLO view of a mammographic finding in the left breast were also obtained. The focal asymmetry in the retroareolar left breast at far posterior depth, shown previously to likely represent overlapping fibroglandular tissue and a normal  low left axillary lymph node, is unchanged. Spot compression views of the upper outer quadrant of the left breast at posterior depth confirm architectural distortion vaguely visible on the full field tomosynthesis images, which was not clearly visible on prior examinations. There is no associated mass or suspicious calcifications. No mammographic evidence of malignancy involving the right breast. Mammographic images were processed with CAD. On physical exam, there is no palpable abnormality in the upper inner or upper outer left breast. I do not identify a definite scar on the skin of the left breast. Targeted left breast ultrasound is performed, showing no correlate for the architectural distortion involving the upper outer quadrant of the left breast. The vague hypoechoic focus at the 11 o'clock position approximately 8 cm from the nipple is again demonstrated, measuring approximately 6 x 7 x 9 mm, unchanged dating back to the November, 2017 ultrasound. IMPRESSION: 1. Architectural distortion involving the upper outer quadrant of the left breast at posterior depth without sonographic correlate. The patient states that she had a lipoma removed from what she thought was the left breast many years ago, though I do not identify a scar on the skin of the upper outer left breast in this location to confirm the prior surgery. 2. Stable likely benign focal asymmetry involving the upper inner quadrant of the left breast, including a hypoechoic focus on ultrasound which is the likely sonographic correlate. 3. No mammographic evidence of malignancy involving the right breast. RECOMMENDATION: Stereotactic tomosynthesis core needle biopsy of the architectural distortion involving the upper outer quadrant of the left breast. The stereotactic needle biopsy procedure was discussed with the patient and her questions were answered. She has agreed to proceed and the biopsy has been scheduled. I have discussed the findings and  recommendations with the patient. Results were also provided in writing at the conclusion of the visit. BI-RADS CATEGORY  4: Suspicious. Electronically Signed   By: Evangeline Dakin M.D.   On: 01/17/2017 17:14   Mm Diag Breast Tomo Bilateral  Result Date: 01/17/2017 CLINICAL DATA:  One year interval follow-up of a likely benign focal asymmetry in the upper left breast at far posterior depth,  felt to represent overlapping fibroglandular tissue and a normal low axillary lymph node. A hypoechoic focus in the upper inner left breast on ultrasound is being followed which may correspond to the focal asymmetry. Annual evaluation, right breast. Patient states she had a benign lipoma removed from what she believed was the left breast many years ago while in [place] though she is unsure. EXAM: 2D DIGITAL DIAGNOSTIC BILATERAL MAMMOGRAM WITH CAD AND ADJUNCT TOMO ULTRASOUND LEFT BREAST COMPARISON:  Mammography 07/06/2016 (left), 12/20/2015 (left), 12/13/2015 (bilateral) and earlier. Left breast ultrasound 07/06/2016, 12/20/2015. ACR Breast Density Category b: There are scattered areas of fibroglandular density. FINDINGS: Standard 2D and tomosynthesis full field CC and MLO views of both breasts were obtained. A standard and tomosynthesis spot-compression CC and MLO view of a mammographic finding in the left breast were also obtained. The focal asymmetry in the retroareolar left breast at far posterior depth, shown previously to likely represent overlapping fibroglandular tissue and a normal low left axillary lymph node, is unchanged. Spot compression views of the upper outer quadrant of the left breast at posterior depth confirm architectural distortion vaguely visible on the full field tomosynthesis images, which was not clearly visible on prior examinations. There is no associated mass or suspicious calcifications. No mammographic evidence of malignancy involving the right breast. Mammographic images were processed  with CAD. On physical exam, there is no palpable abnormality in the upper inner or upper outer left breast. I do not identify a definite scar on the skin of the left breast. Targeted left breast ultrasound is performed, showing no correlate for the architectural distortion involving the upper outer quadrant of the left breast. The vague hypoechoic focus at the 11 o'clock position approximately 8 cm from the nipple is again demonstrated, measuring approximately 6 x 7 x 9 mm, unchanged dating back to the November, 2017 ultrasound. IMPRESSION: 1. Architectural distortion involving the upper outer quadrant of the left breast at posterior depth without sonographic correlate. The patient states that she had a lipoma removed from what she thought was the left breast many years ago, though I do not identify a scar on the skin of the upper outer left breast in this location to confirm the prior surgery. 2. Stable likely benign focal asymmetry involving the upper inner quadrant of the left breast, including a hypoechoic focus on ultrasound which is the likely sonographic correlate. 3. No mammographic evidence of malignancy involving the right breast. RECOMMENDATION: Stereotactic tomosynthesis core needle biopsy of the architectural distortion involving the upper outer quadrant of the left breast. The stereotactic needle biopsy procedure was discussed with the patient and her questions were answered. She has agreed to proceed and the biopsy has been scheduled. I have discussed the findings and recommendations with the patient. Results were also provided in writing at the conclusion of the visit. BI-RADS CATEGORY  4: Suspicious. Electronically Signed   By: Evangeline Dakin M.D.   On: 01/17/2017 17:14     Assessment & Plan:  Plan  I have discontinued [name] A. Kosanke's Vitamin D (Ergocalciferol), oxycodone-acetaminophen, and predniSONE. I am also having her start on METRONIDAZOLE (TOPICAL). Additionally, I am having her  maintain her Phenylephrine-Acetaminophen (TYLENOL SINUS CONGESTION/PAIN PO), multivitamin with minerals, Oxycodone HCl, gabapentin, glucose blood, ONETOUCH DELICA LANCETS 69G, OVER THE COUNTER MEDICATION, cetirizine, hydroxychloroquine, cyclobenzaprine, levonorgestrel, Calcium-Magnesium-Vitamin D (CALCIUM MAGNESIUM PO), OVER THE COUNTER MEDICATION, lisinopril, leflunomide, naproxen sodium, estradiol, levothyroxine, atorvastatin, metFORMIN, lisinopril, busPIRone, buPROPion, lamoTRIgine, and Vitamin D.  Meds ordered this encounter  Medications  .  METRONIDAZOLE, TOPICAL, 0.75 % LOTN    Sig: Apply 1 application topically daily.    Dispense:  59 mL    Refill:  2    Problem List Items Addressed This Visit      Unprioritized   Essential hypertension    Well controlled, no changes to meds. Encouraged heart healthy diet such as the DASH diet and exercise as tolerated.       Hyperlipidemia associated with type 2 diabetes mellitus ([place])    Encouraged heart healthy diet, increase exercise, avoid trans fats, consider a krill oil cap daily       Other Visit Diagnoses    Hypothyroidism, unspecified type    -  Primary   Relevant Orders   TSH   DM (diabetes mellitus) type II, controlled, with peripheral vascular disorder ([place])       Relevant Orders   Hemoglobin A1c   Hyperlipidemia LDL goal <100       Relevant Orders   Comprehensive metabolic panel   Lipid panel   STD exposure       Relevant Orders   HIV antibody (Completed)   RPR (Completed)   Urine cytology ancillary only   Rosacea, acne       Relevant Medications   METRONIDAZOLE, TOPICAL, 0.75 % LOTN   Hepatitis A and hepatitis B vaccine administered       Relevant Orders   Hepatitis A hepatitis B combined vaccine IM (Completed)   Influenza vaccine administered       Relevant Orders   Flu Vaccine QUAD 6+ mos PF IM (Fluarix Quad PF) (Completed)      Follow-up: Return in about 6 months (around 07/19/2017) for annual exam,  fasting.  Ann Held, DO

## 2017-01-19 ENCOUNTER — Other Ambulatory Visit: Payer: Self-pay | Admitting: Family Medicine

## 2017-01-19 ENCOUNTER — Ambulatory Visit
Admission: RE | Admit: 2017-01-19 | Discharge: 2017-01-19 | Disposition: A | Discharge status: Home / Self Care | Payer: BLUE CROSS/BLUE SHIELD | Source: Ambulatory Visit | Attending: Family Medicine | Admitting: Family Medicine

## 2017-01-19 DIAGNOSIS — N6489 Other specified disorders of breast: Secondary | ICD-10-CM

## 2017-01-19 LAB — COMPREHENSIVE METABOLIC PANEL
ALBUMIN: 4.1 g/dL (ref 3.5–5.2)
ALK PHOS: 45 U/L (ref 39–117)
ALT: 20 U/L (ref 0–35)
AST: 20 U/L (ref 0–37)
BUN: 12 mg/dL (ref 6–23)
CALCIUM: 8.6 mg/dL (ref 8.4–10.5)
CO2: 29 mEq/L (ref 19–32)
Chloride: 107 mEq/L (ref 96–112)
Creatinine, Ser: 1.07 mg/dL (ref 0.40–1.20)
GFR: 57.73 mL/min — AB (ref >60.00)
Glucose, Bld: 81 mg/dL (ref 70–99)
POTASSIUM: 4.1 meq/L (ref 3.5–5.1)
Sodium: 140 mEq/L (ref 135–145)
TOTAL PROTEIN: 6.6 g/dL (ref 6.0–8.3)
Total Bilirubin: 0.6 mg/dL (ref 0.2–1.2)

## 2017-01-19 LAB — LIPID PANEL
CHOLESTEROL: 124 mg/dL (ref 0–200)
HDL: 68.6 mg/dL (ref >39.00)
LDL Cholesterol: 44 mg/dL (ref 0–99)
NONHDL: 55.67
TRIGLYCERIDES: 56 mg/dL (ref 0.0–149.0)
Total CHOL/HDL Ratio: 2
VLDL: 11.2 mg/dL (ref 0.0–40.0)

## 2017-01-19 LAB — HEMOGLOBIN A1C: HEMOGLOBIN A1C: 5 % (ref 4.6–6.5)

## 2017-01-19 LAB — TSH: TSH: 1.2 u[IU]/mL (ref 0.35–4.50)

## 2017-01-19 LAB — RPR: RPR: NONREACTIVE

## 2017-01-19 LAB — HIV ANTIBODY (ROUTINE TESTING W REFLEX): HIV 1&2 Ab, 4th Generation: NONREACTIVE

## 2017-01-19 NOTE — Assessment & Plan Note (Signed)
Encouraged heart healthy diet, increase exercise, avoid trans fats, consider a krill oil cap daily 

## 2017-01-19 NOTE — Assessment & Plan Note (Signed)
Well controlled, no changes to meds. Encouraged heart healthy diet such as the DASH diet and exercise as tolerated.  °

## 2017-01-22 ENCOUNTER — Encounter: Payer: Self-pay | Admitting: Family Medicine

## 2017-01-22 ENCOUNTER — Other Ambulatory Visit: Payer: Self-pay | Admitting: Family Medicine

## 2017-01-22 DIAGNOSIS — E039 Hypothyroidism, unspecified: Secondary | ICD-10-CM

## 2017-01-22 LAB — URINE CYTOLOGY ANCILLARY ONLY
Chlamydia: NEGATIVE
NEISSERIA GONORRHEA: NEGATIVE
Trichomonas: NEGATIVE

## 2017-01-22 MED ORDER — LEVOTHYROXINE SODIUM 50 MCG PO TABS: 50.0000 ug | ORAL_TABLET | Freq: Every day | ORAL | 0 refills | Status: DC

## 2017-01-24 ENCOUNTER — Other Ambulatory Visit: Payer: Self-pay | Admitting: Family Medicine

## 2017-01-24 DIAGNOSIS — Z79899 Other long term (current) drug therapy: Secondary | ICD-10-CM | POA: Diagnosis not present

## 2017-01-24 DIAGNOSIS — E785 Hyperlipidemia, unspecified: Secondary | ICD-10-CM

## 2017-01-24 MED ORDER — LEVOTHYROXINE SODIUM 50 MCG PO TABS: 50.0000 ug | ORAL_TABLET | Freq: Every day | ORAL | 1 refills | Status: DC

## 2017-01-25 ENCOUNTER — Telehealth: Payer: Self-pay | Admitting: Family Medicine

## 2017-01-25 LAB — URINE CYTOLOGY ANCILLARY ONLY
Bacterial vaginitis: NEGATIVE
Candida vaginitis: NEGATIVE

## 2017-01-25 NOTE — Telephone Encounter (Signed)
Left detailed message on machine that all labs were normal/negative

## 2017-01-25 NOTE — Telephone Encounter (Signed)
Copied from [place]. Topic: Quick Communication - See Telephone Encounter >> Jan 25, 2017  9:17 AM Ivar Drape wrote: CRM for notification. See Telephone encounter for:  01/25/17. Patient would like the results of the test she took on 01/18/17. She said she cannot see some of the results on MyChart.  Please call [phone] and if she does not answer, a detailed message can be left on the machine.

## 2017-01-29 ENCOUNTER — Other Ambulatory Visit: Payer: Self-pay | Admitting: Family Medicine

## 2017-01-29 DIAGNOSIS — IMO0001 Reserved for inherently not codable concepts without codable children: Secondary | ICD-10-CM

## 2017-01-29 DIAGNOSIS — E119 Type 2 diabetes mellitus without complications: Secondary | ICD-10-CM

## 2017-01-29 DIAGNOSIS — E1165 Type 2 diabetes mellitus with hyperglycemia: Principal | ICD-10-CM

## 2017-02-05 ENCOUNTER — Ambulatory Visit (HOSPITAL_COMMUNITY): Payer: Self-pay | Admitting: Psychiatry

## 2017-02-07 DIAGNOSIS — G5603 Carpal tunnel syndrome, bilateral upper limbs: Secondary | ICD-10-CM | POA: Diagnosis not present

## 2017-02-07 DIAGNOSIS — M0609 Rheumatoid arthritis without rheumatoid factor, multiple sites: Secondary | ICD-10-CM | POA: Diagnosis not present

## 2017-02-13 ENCOUNTER — Ambulatory Visit (INDEPENDENT_AMBULATORY_CARE_PROVIDER_SITE_OTHER): Payer: PPO | Admitting: Psychiatry

## 2017-02-13 ENCOUNTER — Other Ambulatory Visit: Payer: Self-pay

## 2017-02-13 ENCOUNTER — Encounter (HOSPITAL_COMMUNITY): Payer: Self-pay | Admitting: Psychiatry

## 2017-02-13 VITALS — BP 128/78 | HR 96 | Ht 64.0 in | Wt 196.0 lb

## 2017-02-13 DIAGNOSIS — Z975 Presence of (intrauterine) contraceptive device: Secondary | ICD-10-CM | POA: Diagnosis not present

## 2017-02-13 DIAGNOSIS — G8929 Other chronic pain: Secondary | ICD-10-CM

## 2017-02-13 DIAGNOSIS — F411 Generalized anxiety disorder: Secondary | ICD-10-CM

## 2017-02-13 DIAGNOSIS — F331 Major depressive disorder, recurrent, moderate: Secondary | ICD-10-CM

## 2017-02-13 DIAGNOSIS — M549 Dorsalgia, unspecified: Secondary | ICD-10-CM | POA: Diagnosis not present

## 2017-02-13 DIAGNOSIS — G4700 Insomnia, unspecified: Secondary | ICD-10-CM | POA: Diagnosis not present

## 2017-02-13 MED ORDER — LAMOTRIGINE 25 MG PO TABS: 25.0000 mg | ORAL_TABLET | Freq: Every day | ORAL | 0 refills | Status: DC

## 2017-02-13 MED ORDER — LAMOTRIGINE 200 MG PO TABS: ORAL_TABLET | ORAL | 0 refills | Status: DC

## 2017-02-13 NOTE — Progress Notes (Signed)
Patient ID: Michelle Mueller, female   DOB: 1965-08-16, 52 y.o.   MRN: [id number]  Psychiatric Outpatient Follow up Visit  Patient Identification: Michelle Mueller MRN:  [id number] Date of Evaluation:  02/13/2017 Referral Source: [name] Chief Complaint:   Chief Complaint    Follow-up; Other     Visit Diagnosis: MDD. GAD . Mood related to Chronic back pain Diagnosis:   Patient Active Problem List   Diagnosis Date Noted  . Hyperlipidemia associated with type 2 diabetes mellitus ([place]) [E11.69, E78.5] 07/30/2016  . Chest pain of uncertain etiology [B51.02] 12/21/2014  . Essential hypertension [I10] 12/21/2014  . Myalgia [M79.10] 11/10/2014  . Insomnia [G47.00] 11/10/2014  . UTI (urinary tract infection) [N39.0] 08/04/2013  . Fibromyalgia muscle pain [M79.7] 06/13/2013  . Severe depression ([place]) [F32.2] 06/13/2013  . DM (diabetes mellitus) type II uncontrolled, periph vascular disorder ([place]) [E11.51, E11.65] 06/13/2013  . Rheumatoid arthritis ([place]) [M06.9] 03/19/2013  . Fibromyalgia [M79.7] 03/19/2013  . Unspecified hypothyroidism [E03.9] 03/19/2013  . Goiter [E04.9] 03/19/2013  . Obesity (BMI 30-39.9) [E66.9] 03/19/2013  . 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 Family history of pancreatic cancer [Z80.0] 01/18/2011  . Chronic diarrhea [K52.9] 01/18/2011  . ACUTE PHARYNGITIS [J02.9] 10/24/2010   History of Present Illness:   Patient returns for follow up visit. Initially seen by [name] with the following " She initially presented as having  been off of psychiatric medications for approximately 7-8 years.  She had recurrence of depression and feeling of down. Pain exacerbated her depression and tiredness.  RA pain effects mood . She is working on getting a better remedy or plan  She was low and depressed last visit we increased her Lamictal to 200 mg she is apparently taking 225 and that is helping mood is better sleep is disturbed with overall she is feeling  less energy and less down.    No rash Modifying factor: kids Severity of depression: better. Now 7/10 Timing: pain related  associated symptoms: back pain  Past Medical History:  Past Medical History:  Diagnosis Date  . Anxiety   . Arthritis   . Asthma    last attack 3 yrs ago  . Chicken pox   . Depression   . Diabetes mellitus without complication ([place])   . Fibromyalgia   . Goiter   . H/O hiatal hernia   . Hypertension    [name]  . Hypothyroidism   . Ovarian cyst   . PONV (postoperative nausea and vomiting)     Past Surgical History:  Procedure Laterality Date  . ABDOMINAL EXPOSURE  12/18/2011   Procedure: ABDOMINAL EXPOSURE;  Surgeon: [name], MD;  Location: [facility];  Service: Vascular;  Laterality: N/A;  Anterior Expossure for anterior lumbar interbody fuson  . ANTERIOR LUMBAR FUSION  12/18/2011   Procedure: ANTERIOR LUMBAR FUSION 1 LEVEL;  Surgeon: [name], MD;  Location: [facility];  Service: Neurosurgery;  Laterality: N/A;  Lumbar four-five Anterior Lumbar Interbody Fusion with Dr. [name] to do anterior exposure  . CESAREAN SECTION    . CHOLECYSTECTOMY    . HERNIA REPAIR    . LIPOMA EXCISION     L breast  . TONSILLECTOMY    . TUMOR EXCISION     Nerve sheath tumor, R arm   Family History:  Family History  Problem Relation Age of Onset  . Heart disease Father   . Lung cancer Father   . Colon cancer Maternal Aunt   .  Pancreatic cancer Maternal Grandmother   . Pancreatic cancer Maternal Grandfather   . Pancreatic cancer Paternal Grandmother   . Cancer Paternal Grandfather   . Sexual abuse Neg Hx    Social History:   Social History   Socioeconomic History  . Marital status: Divorced    Spouse name: None  . Number of children: None  . Years of education: None  . Highest education level: None  Social Needs  . Financial resource strain: None  . Food insecurity - worry: None  . Food insecurity - inability: None  . Transportation  needs - medical: None  . Transportation needs - non-medical: None  Occupational History  . None  Tobacco Use  . Smoking status: Never Smoker  . Smokeless tobacco: Never Used  Substance and Sexual Activity  . Alcohol use: No    Alcohol/week: 0.0 oz  . Drug use: No  . Sexual activity: No    Birth control/protection: IUD, None  Other Topics Concern  . None  Social History Narrative  . None      Psychiatric Specialty Exam:      Review of Systems  Constitutional: Negative for fever.  Cardiovascular: Negative for palpitations.  Neurological: Negative for tremors.  Psychiatric/Behavioral: Negative for depression, substance abuse and suicidal ideas. The patient does not have insomnia.     Blood pressure 128/78, pulse 96, height 5\' 4"  (1.626 m), weight 196 lb (88.9 kg).Body mass index is 33.64 kg/m.  General Appearance: Well Groomed  Eye Contact:  Good  Speech:  Clear and Coherent  Volume:  Normal  Mood: improved  Affect: congruent  Thought Process:  Coherent, Goal Directed, Intact, Linear and Logical  Orientation:  Full (Time, Place, and Person)  Thought Content:  WDL  Suicidal Thoughts:  No  Homicidal Thoughts:  No  Memory:  Immediate;   Good Recent;   Good Remote;   Good  Judgement:  Good  Insight:  Present  Psychomotor Activity:  Decreased  Concentration:  Fair  Recall:  Good  Fund of Knowledge:Good  Language: Good  Akathisia:  No  Handed:  Left  AIMS (if indicated):    Assets:  Communication Skills Desire for Improvement Financial Resources/Insurance Housing Resilience Transportation  ADL's:  Intact  Cognition: WNL  Sleep:  poor    Allergies:   Allergies  Allergen Reactions  . Erythromycin Other (See Comments)    arrhythmia  . Zocor  [Simvastatin-High Dose] Rash  . Doxycycline Other (See Comments)    Decreased BP and caused dizziness per patient   Current Medications: Current Outpatient Medications  Medication Sig Dispense Refill  .  atorvastatin (LIPITOR) 10 MG tablet Take 1 tablet (10 mg total) by mouth daily. 90 tablet 1  . buPROPion (WELLBUTRIN XL) 300 MG 24 hr tablet TAKE 1 TABLET(300 MG) BY MOUTH DAILY 90 tablet 0  . busPIRone (BUSPAR) 10 MG tablet TAKE 1 TABLET(10 MG)  Three times a day 270 tablet 0  . Calcium-Magnesium-Vitamin D (CALCIUM MAGNESIUM PO) Take 1 tablet by mouth daily.    . cetirizine (ZYRTEC) 5 MG tablet Take 5 mg by mouth daily.    . Cholecalciferol (VITAMIN D) 2000 units CAPS Take 1 capsule by mouth daily.    . cyclobenzaprine (FLEXERIL) 5 MG tablet   1  . estradiol (VIVELLE-DOT) 0.0375 MG/24HR Use 1 patch twice weekly.  Disp 24 patches for a 63 day period. 24 patch 12  . gabapentin (NEURONTIN) 300 MG capsule Take 3 capsules by mouth at bedtime.     [name]  glucose blood (ONE TOUCH ULTRA TEST) test strip Test blood sugar once daily. Dx code: 250.00 100 each 12  . hydroxychloroquine (PLAQUENIL) 200 MG tablet Take 400 mg by mouth daily.   2  . lamoTRIgine (LAMICTAL) 200 MG tablet Can take one and half of 150mg  for now till finished those pills 90 tablet 0  . leflunomide (ARAVA) 20 MG tablet Take by mouth.    . levonorgestrel (MIRENA) 20 MCG/24HR IUD 1 each by Intrauterine route once.    [name] levothyroxine (SYNTHROID, LEVOTHROID) 50 MCG tablet Take 1 tablet (50 mcg total) by mouth daily before breakfast. 90 tablet 1  . lisinopril (PRINIVIL,ZESTRIL) 5 MG tablet Take 1 tablet (5 mg total) by mouth daily. 90 tablet 0  . lisinopril (PRINIVIL,ZESTRIL) 5 MG tablet TAKE 1 TABLET BY MOUTH EVERY DAY 90 tablet 0  . metFORMIN (GLUCOPHAGE-XR) 500 MG 24 hr tablet TAKE 1 TABLET BY MOUTH EVERY EVENING 90 tablet 0  . METRONIDAZOLE, TOPICAL, 0.75 % LOTN Apply 1 application topically daily. 59 mL 2  . Multiple Vitamin (MULTIVITAMIN WITH MINERALS) TABS Take 1 tablet by mouth daily.    . naproxen sodium (ANAPROX) 220 MG tablet Take 220 mg by mouth.    Glory Rosebush DELICA LANCETS 69G MISC Test blood sugar once daily. Dx code: 250.00 100  each 12  . OVER THE COUNTER MEDICATION Vitamin B-12 3000 mcg 1 table daily.    [name] OVER THE COUNTER MEDICATION Amberen 2 tablets every morning    . oxyCODONE 10 MG TABS Take 1 tablet (10 mg total) by mouth every 6 (six) hours as needed (for pain). 60 tablet 0  . Phenylephrine-Acetaminophen (TYLENOL SINUS CONGESTION/PAIN PO) Take 2 tablets by mouth daily as needed (for sinus congestion).     [name] lamoTRIgine (LAMICTAL) 25 MG tablet Take 1 tablet (25 mg total) by mouth daily. This is in addtion to 200mg  . Total dose is 225mg  90 tablet 0   No current facility-administered medications for this visit.      Treatment Plan Summary: Medication management  1. Depression:: iimproved. Continue lamictal now on 225mg . Send 90 day supply. Continue wellbutrin already have 2. Anxiety not worse. Still worries. Continue buspar 3. Insomnia:ongoing. Reviewed sleep hgyiene.  4. Back pain:chronic. Working with providers and symptomatic management. FU for RA   FU 3 months.    Merian Capron, MD  3:02 PM 02/13/2017

## 2017-02-20 ENCOUNTER — Encounter: Payer: Self-pay | Admitting: Obstetrics & Gynecology

## 2017-02-20 ENCOUNTER — Ambulatory Visit (INDEPENDENT_AMBULATORY_CARE_PROVIDER_SITE_OTHER): Payer: PPO

## 2017-02-20 ENCOUNTER — Ambulatory Visit (INDEPENDENT_AMBULATORY_CARE_PROVIDER_SITE_OTHER): Payer: PPO | Admitting: Obstetrics & Gynecology

## 2017-02-20 DIAGNOSIS — Z23 Encounter for immunization: Secondary | ICD-10-CM | POA: Diagnosis not present

## 2017-02-20 DIAGNOSIS — N951 Menopausal and female climacteric states: Secondary | ICD-10-CM | POA: Diagnosis not present

## 2017-02-20 MED ORDER — ESTRADIOL 0.05 MG/24HR TD PTTW: 1.0000 | MEDICATED_PATCH | TRANSDERMAL | 12 refills | Status: DC

## 2017-02-20 NOTE — Progress Notes (Signed)
Pt states she has gotten acne since using the Estradiol patch. She says it worked at first and now hot flashes and lack of sleep has returned.

## 2017-02-21 NOTE — Progress Notes (Signed)
   Subjective:    Patient ID: Michelle Mueller, female    DOB: 15-May-1965, 52 y.o.   MRN: [id number]  HPI  52 yo female presents with increase in hot flashes and problems sleeping.  Pt on 0.0375 Vivelle Dot.  (Has Mirena placed).  Pt did well for several months but seems to have worsening hot flashes the past month.  Pt is treated for depression and does not feel her depression is worsening.  Pt denies any menses for 6 months.    Review of Systems  Constitutional: Positive for fatigue.  Gastrointestinal: Negative.   Endocrine: Negative.   Genitourinary: Negative for menstrual problem and vaginal bleeding.       +hot flashes, night sweats  Psychiatric/Behavioral:       Insomnia       Objective:   Physical Exam  Constitutional: She is oriented to person, place, and time. She appears well-developed and well-nourished. No distress.  Appears tired  HENT:  Head: Normocephalic and atraumatic.  Eyes: Conjunctivae are normal.  Pulmonary/Chest: Effort normal.  Musculoskeletal: She exhibits no edema.  Neurological: She is alert and oriented to person, place, and time.  Skin: Skin is warm and dry.  Psychiatric: She has a normal mood and affect.  Vitals reviewed.  Vitals:   02/20/17 1455  BP: 126/86  Pulse: 79  Weight: 198 lb (89.8 kg)  Height: 5\' 5"  (1.651 m)    Assessment & Plan:  52 yo female with worsening hot flashes and insomnia  1-Increase vivelle to 0.05 2.  Continue Mirena, report abnml bleeding 3.  If insomnia not better after hot flashes resolves, pt should see PCP.    25 minutes spent face to face with patient with >50% counseling.

## 2017-03-15 ENCOUNTER — Other Ambulatory Visit: Payer: Self-pay | Admitting: *Deleted

## 2017-03-15 MED ORDER — ESTRADIOL 0.05 MG/24HR TD PTTW: 1.0000 | MEDICATED_PATCH | TRANSDERMAL | 4 refills | Status: DC

## 2017-03-15 NOTE — Telephone Encounter (Signed)
Pt called stating that her insurance would not pay for a 30 day supply of Vivelle but would for a 90 day.  She is also requesting a generic patch.  Order sent to [facility] for a 90 day supply and generic is permissible.

## 2017-03-16 DIAGNOSIS — Z1159 Encounter for screening for other viral diseases: Secondary | ICD-10-CM | POA: Diagnosis not present

## 2017-03-16 DIAGNOSIS — G5603 Carpal tunnel syndrome, bilateral upper limbs: Secondary | ICD-10-CM | POA: Diagnosis not present

## 2017-03-16 DIAGNOSIS — M0609 Rheumatoid arthritis without rheumatoid factor, multiple sites: Secondary | ICD-10-CM | POA: Diagnosis not present

## 2017-03-16 DIAGNOSIS — M0600 Rheumatoid arthritis without rheumatoid factor, unspecified site: Secondary | ICD-10-CM | POA: Diagnosis not present

## 2017-03-16 DIAGNOSIS — Z79899 Other long term (current) drug therapy: Secondary | ICD-10-CM | POA: Diagnosis not present

## 2017-03-26 DIAGNOSIS — M0600 Rheumatoid arthritis without rheumatoid factor, unspecified site: Secondary | ICD-10-CM | POA: Diagnosis not present

## 2017-03-26 DIAGNOSIS — M961 Postlaminectomy syndrome, not elsewhere classified: Secondary | ICD-10-CM | POA: Diagnosis not present

## 2017-04-10 DIAGNOSIS — M79642 Pain in left hand: Secondary | ICD-10-CM | POA: Diagnosis not present

## 2017-04-10 DIAGNOSIS — M79641 Pain in right hand: Secondary | ICD-10-CM | POA: Diagnosis not present

## 2017-04-10 DIAGNOSIS — G5603 Carpal tunnel syndrome, bilateral upper limbs: Secondary | ICD-10-CM | POA: Diagnosis not present

## 2017-04-12 ENCOUNTER — Other Ambulatory Visit (HOSPITAL_COMMUNITY): Payer: Self-pay | Admitting: Psychiatry

## 2017-04-12 ENCOUNTER — Other Ambulatory Visit: Payer: Self-pay | Admitting: Family Medicine

## 2017-04-12 DIAGNOSIS — I10 Essential (primary) hypertension: Secondary | ICD-10-CM

## 2017-04-23 DIAGNOSIS — G5601 Carpal tunnel syndrome, right upper limb: Secondary | ICD-10-CM | POA: Diagnosis not present

## 2017-04-23 DIAGNOSIS — G5602 Carpal tunnel syndrome, left upper limb: Secondary | ICD-10-CM | POA: Diagnosis not present

## 2017-04-24 ENCOUNTER — Other Ambulatory Visit: Payer: Self-pay | Admitting: Family Medicine

## 2017-04-24 DIAGNOSIS — E119 Type 2 diabetes mellitus without complications: Secondary | ICD-10-CM

## 2017-04-24 DIAGNOSIS — IMO0001 Reserved for inherently not codable concepts without codable children: Secondary | ICD-10-CM

## 2017-04-24 DIAGNOSIS — E1165 Type 2 diabetes mellitus with hyperglycemia: Principal | ICD-10-CM

## 2017-05-03 ENCOUNTER — Telehealth: Payer: Self-pay | Admitting: Family Medicine

## 2017-05-03 NOTE — Telephone Encounter (Signed)
Copied from [place] [id number]. Topic: Bill or Statement - Patient/Guarantor Inquiry >> May 03, 2017 11:57 AM Margot Ables wrote: Reason for CRM:  Eddie Dibbles calling about DX for Hep A/B vaccine administered 02/20/17. For insurance to covered this visit the DX needs to be a non routinue. The patients rheumatologist ([facility]) notes the patient is on an immuno-suppressive medication and was advised to have it the vaccine administered. Pt is currently taking Leflunomide which will suppress the immune system. If possible to add this DX and refile the claim.

## 2017-05-04 ENCOUNTER — Other Ambulatory Visit (HOSPITAL_COMMUNITY): Payer: Self-pay | Admitting: Psychiatry

## 2017-05-04 NOTE — Telephone Encounter (Signed)
Hey [place],   She has HTA [employer] plan ,they will only cover Pneumo & Flu and HEP B ( high/ intermediate risk ). Since this was a combo vaccine Hep A/B, not sure if they would pay at all.  Below is HTA guidance for Hep B given by part B provider.  She probably should have gotten from the pharmacy under Part D plan.  I don't know that adding any other diagnosis would work and looks like it would need to be below.  She may need to appeal with her insurance.    Hepatitis B if a member is at high risk and they meet Medicare Part B coverage rules. If the condition falls into one of the categories listed below, the vaccine is covered. No  [employer] Provider Manual ? March 2018  copayment applies if this is the only service provided;  End-stage renal disease (ESRD) members;  Hemophiliacs receiving Factor VIII or IX concentrates;  Mentally handicapped institutionalized residents;  Persons living in the same household as hepatitis B carriers;  Homosexual men;  IV drug abusers;  Biochemist, clinical in institutions for the mentally retarded;  Health care workers who have contact with blood or blood-derived body   Thanks,  [employer]

## 2017-05-10 ENCOUNTER — Ambulatory Visit (HOSPITAL_COMMUNITY): Payer: PPO | Admitting: Psychiatry

## 2017-05-10 ENCOUNTER — Encounter (HOSPITAL_COMMUNITY): Payer: Self-pay | Admitting: Psychiatry

## 2017-05-10 ENCOUNTER — Other Ambulatory Visit: Payer: Self-pay

## 2017-05-10 VITALS — BP 114/80 | HR 105 | Ht 65.0 in | Wt 199.0 lb

## 2017-05-10 DIAGNOSIS — M549 Dorsalgia, unspecified: Secondary | ICD-10-CM

## 2017-05-10 DIAGNOSIS — F411 Generalized anxiety disorder: Secondary | ICD-10-CM | POA: Diagnosis not present

## 2017-05-10 DIAGNOSIS — F331 Major depressive disorder, recurrent, moderate: Secondary | ICD-10-CM | POA: Diagnosis not present

## 2017-05-10 DIAGNOSIS — M2550 Pain in unspecified joint: Secondary | ICD-10-CM

## 2017-05-10 DIAGNOSIS — G8929 Other chronic pain: Secondary | ICD-10-CM

## 2017-05-10 MED ORDER — LAMOTRIGINE 25 MG PO TABS: 25.0000 mg | ORAL_TABLET | Freq: Every day | ORAL | 0 refills | Status: DC

## 2017-05-10 MED ORDER — LAMOTRIGINE 200 MG PO TABS: ORAL_TABLET | ORAL | 0 refills | Status: DC

## 2017-05-10 MED ORDER — BUPROPION HCL ER (XL) 300 MG PO TB24: ORAL_TABLET | ORAL | 0 refills | Status: DC

## 2017-05-10 NOTE — Progress Notes (Signed)
Patient ID: Michelle Mueller, female   DOB: 1965/04/19, 52 y.o.   MRN: [id number]  Psychiatric Outpatient Follow up Visit  Patient Identification: Michelle Mueller MRN:  [id number] Date of Evaluation:  05/10/2017 Referral Source: [name] Chief Complaint:   Chief Complaint    Follow-up; Other     Visit Diagnosis: MDD. GAD . Mood related to Chronic back pain Diagnosis:   Patient Active Problem List   Diagnosis Date Noted  . Hyperlipidemia associated with type 2 diabetes mellitus ([place]) [E11.69, E78.5] 07/30/2016  . Chest pain of uncertain etiology [D53.29] 12/21/2014  . Essential hypertension [I10] 12/21/2014  . Myalgia [M79.10] 11/10/2014  . Insomnia [G47.00] 11/10/2014  . UTI (urinary tract infection) [N39.0] 08/04/2013  . Fibromyalgia muscle pain [M79.7] 06/13/2013  . Severe depression ([place]) [F32.2] 06/13/2013  . DM (diabetes mellitus) type II uncontrolled, periph vascular disorder ([place]) [E11.51, E11.65] 06/13/2013  . Rheumatoid arthritis ([place]) [M06.9] 03/19/2013  . Fibromyalgia [M79.7] 03/19/2013  . Unspecified hypothyroidism [E03.9] 03/19/2013  . Goiter [E04.9] 03/19/2013  . Obesity (BMI 30-39.9) [E66.9] 03/19/2013  . 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 Family history of pancreatic cancer [Z80.0] 01/18/2011  . Chronic diarrhea [K52.9] 01/18/2011   History of Present Illness:   Patient returns for follow up visit. Initially seen by Michelle Mueller with the following " She initially presented as having  been off of psychiatric medications for approximately 7-8 years.  She had recurrence of depression and feeling of down. Pain exacerbated her depression and tiredness.  RA pain effects mood. On oxycodone qid  lamictal helps depression but can get frustrated due to pain  Son going to college   No rash Modifying factor: kids Severity of depression: 7/10 Timing: pain related  associated symptoms: back pain  Past Medical History:  Past Medical History:   Diagnosis Date  . Anxiety   . Arthritis   . Asthma    last attack 3 yrs ago  . Chicken pox   . Depression   . Diabetes mellitus without complication ([place])   . Fibromyalgia   . Goiter   . H/O hiatal hernia   . Hypertension    [name]  . Hypothyroidism   . Ovarian cyst   . PONV (postoperative nausea and vomiting)     Past Surgical History:  Procedure Laterality Date  . ABDOMINAL EXPOSURE  12/18/2011   Procedure: ABDOMINAL EXPOSURE;  Surgeon: [name], MD;  Location: [facility];  Service: Vascular;  Laterality: N/A;  Anterior Expossure for anterior lumbar interbody fuson  . ANTERIOR LUMBAR FUSION  12/18/2011   Procedure: ANTERIOR LUMBAR FUSION 1 LEVEL;  Surgeon: [name], MD;  Location: [place] [facility];  Service: Neurosurgery;  Laterality: N/A;  Lumbar four-five Anterior Lumbar Interbody Fusion with Dr. [name] to do anterior exposure  . CESAREAN SECTION    . CHOLECYSTECTOMY    . HERNIA REPAIR    . LIPOMA EXCISION     L breast  . TONSILLECTOMY    . TUMOR EXCISION     Nerve sheath tumor, R arm   Family History:  Family History  Problem Relation Age of Onset  . Heart disease Father   . Lung cancer Father   . Colon cancer Maternal Aunt   . Pancreatic cancer Maternal Grandmother   . Pancreatic cancer Maternal Grandfather   . Pancreatic cancer Paternal Grandmother   . Cancer Paternal Grandfather   . Sexual abuse Neg Hx    Social History:   Social  History   Socioeconomic History  . Marital status: Divorced    Spouse name: Not on file  . Number of children: Not on file  . Years of education: Not on file  . Highest education level: Not on file  Occupational History  . Not on file  Social Needs  . Financial resource strain: Not on file  . Food insecurity:    Worry: Not on file    Inability: Not on file  . Transportation needs:    Medical: Not on file    Non-medical: Not on file  Tobacco Use  . Smoking status: Never Smoker  . Smokeless tobacco: Never Used   Substance and Sexual Activity  . Alcohol use: No    Alcohol/week: 0.0 oz  . Drug use: No  . Sexual activity: Never    Birth control/protection: IUD, None  Lifestyle  . Physical activity:    Days per week: Not on file    Minutes per session: Not on file  . Stress: Not on file  Relationships  . Social connections:    Talks on phone: Not on file    Gets together: Not on file    Attends religious service: Not on file    Active member of club or organization: Not on file    Attends meetings of clubs or organizations: Not on file    Relationship status: Not on file  Other Topics Concern  . Not on file  Social History Narrative  . Not on file      Psychiatric Specialty Exam:     Review of Systems  Constitutional: Negative for fever.  Cardiovascular: Negative for chest pain and palpitations.  Musculoskeletal: Positive for joint pain.  Neurological: Negative for tremors.  Psychiatric/Behavioral: Negative for depression, substance abuse and suicidal ideas. The patient does not have insomnia.     Blood pressure 114/80, pulse (!) 105, height 5\' 5"  (1.651 m), weight 199 lb (90.3 kg).Body mass index is 33.12 kg/m.  General Appearance: Well Groomed  Eye Contact:  Good  Speech:  Clear and Coherent  Volume:  Normal  Mood: subdued   Affect: congruent  Thought Process:  Coherent, Goal Directed, Intact, Linear and Logical  Orientation:  Full (Time, Place, and Person)  Thought Content:  WDL  Suicidal Thoughts:  No  Homicidal Thoughts:  No  Memory:  Immediate;   Good Recent;   Good Remote;   Good  Judgement:  Good  Insight:  Present  Psychomotor Activity:  Decreased  Concentration:  Fair  Recall:  Good  Fund of Knowledge:Good  Language: Good  Akathisia:  No  Handed:  Left  AIMS (if indicated):    Assets:  Communication Skills Desire for Improvement Financial Resources/Insurance Housing Resilience Transportation  ADL's:  Intact  Cognition: WNL  Sleep:  poor     Allergies:   Allergies  Allergen Reactions  . Erythromycin Other (See Comments)    arrhythmia  . Zocor  [Simvastatin-High Dose] Rash  . Doxycycline Other (See Comments)    Decreased BP and caused dizziness per patient   Current Medications: Current Outpatient Medications  Medication Sig Dispense Refill  . atorvastatin (LIPITOR) 10 MG tablet Take 1 tablet (10 mg total) by mouth daily. 90 tablet 1  . buPROPion (WELLBUTRIN XL) 300 MG 24 hr tablet TAKE 1 TABLET(300 MG) BY MOUTH DAILY 90 tablet 0  . busPIRone (BUSPAR) 10 MG tablet TAKE 1 TABLET BY MOUTH THREE TIMES DAILY 270 tablet 0  . Calcium-Magnesium-Vitamin D (CALCIUM MAGNESIUM PO)  Take 1 tablet by mouth daily.    . cetirizine (ZYRTEC) 5 MG tablet Take 5 mg by mouth daily.    . Cholecalciferol (VITAMIN D) 2000 units CAPS Take 1 capsule by mouth daily.    . cyclobenzaprine (FLEXERIL) 5 MG tablet   1  . estradiol (VIVELLE-DOT) 0.05 MG/24HR patch Place 1 patch (0.05 mg total) onto the skin 2 (two) times a week. May use generic 24 patch 4  . gabapentin (NEURONTIN) 300 MG capsule Take 3 capsules by mouth at bedtime.     [name] glucose blood (ONE TOUCH ULTRA TEST) test strip Test blood sugar once daily. Dx code: 250.00 100 each 12  . hydroxychloroquine (PLAQUENIL) 200 MG tablet Take 400 mg by mouth daily.   2  . lamoTRIgine (LAMICTAL) 200 MG tablet Can take one and half of 150mg  for now till finished those pills 90 tablet 0  . lamoTRIgine (LAMICTAL) 25 MG tablet Take 1 tablet (25 mg total) by mouth daily. This is in addtion to 200mg  . Total dose is 225mg  90 tablet 0  . leflunomide (ARAVA) 20 MG tablet Take by mouth.    . levonorgestrel (MIRENA) 20 MCG/24HR IUD 1 each by Intrauterine route once.    [name] levothyroxine (SYNTHROID, LEVOTHROID) 50 MCG tablet Take 1 tablet (50 mcg total) by mouth daily before breakfast. 90 tablet 1  . lisinopril (PRINIVIL,ZESTRIL) 5 MG tablet Take 1 tablet (5 mg total) by mouth daily. 90 tablet 0  . lisinopril  (PRINIVIL,ZESTRIL) 5 MG tablet TAKE 1 TABLET BY MOUTH EVERY DAY 90 tablet 0  . metFORMIN (GLUCOPHAGE-XR) 500 MG 24 hr tablet TAKE 1 TABLET BY MOUTH EVERY EVENING 90 tablet 0  . METRONIDAZOLE, TOPICAL, 0.75 % LOTN Apply 1 application topically daily. 59 mL 2  . Multiple Vitamin (MULTIVITAMIN WITH MINERALS) TABS Take 1 tablet by mouth daily.    . naproxen sodium (ANAPROX) 220 MG tablet Take 220 mg by mouth.    Glory Rosebush DELICA LANCETS 84O MISC Test blood sugar once daily. Dx code: 250.00 100 each 12  . OVER THE COUNTER MEDICATION Vitamin B-12 3000 mcg 1 table daily.    [name] OVER THE COUNTER MEDICATION Amberen 2 tablets every morning    . oxyCODONE 10 MG TABS Take 1 tablet (10 mg total) by mouth every 6 (six) hours as needed (for pain). 60 tablet 0  . Phenylephrine-Acetaminophen (TYLENOL SINUS CONGESTION/PAIN PO) Take 2 tablets by mouth daily as needed (for sinus congestion).      No current facility-administered medications for this visit.      Treatment Plan Summary: Medication management  1. Depression:: fluctuates . Encouraged to do outdoor activities. Goes to warm pool  Continue lamictal, wellbutrin 2. Anxiety baseline. Continue buspar 3. Insomnia:ongoing. Reviewed sleep hgyiene.  4. Back pain:chronic. Working with providers and symptomatic management. FU for RA   FU 3 months.    Merian Capron, MD  3:06 PM 05/10/2017

## 2017-05-22 ENCOUNTER — Other Ambulatory Visit (HOSPITAL_COMMUNITY): Payer: Self-pay | Admitting: Psychiatry

## 2017-06-05 ENCOUNTER — Encounter (HOSPITAL_BASED_OUTPATIENT_CLINIC_OR_DEPARTMENT_OTHER): Payer: Self-pay | Admitting: *Deleted

## 2017-06-05 ENCOUNTER — Other Ambulatory Visit: Payer: Self-pay

## 2017-06-05 ENCOUNTER — Emergency Department (HOSPITAL_BASED_OUTPATIENT_CLINIC_OR_DEPARTMENT_OTHER)
Admission: EM | Admit: 2017-06-05 | Discharge: 2017-06-05 | Disposition: A | Discharge status: Home / Self Care | Payer: PPO | Attending: Emergency Medicine | Admitting: Emergency Medicine

## 2017-06-05 ENCOUNTER — Ambulatory Visit: Payer: Self-pay | Admitting: *Deleted

## 2017-06-05 ENCOUNTER — Emergency Department (HOSPITAL_BASED_OUTPATIENT_CLINIC_OR_DEPARTMENT_OTHER): Payer: PPO

## 2017-06-05 DIAGNOSIS — F329 Major depressive disorder, single episode, unspecified: Secondary | ICD-10-CM | POA: Insufficient documentation

## 2017-06-05 DIAGNOSIS — R0789 Other chest pain: Secondary | ICD-10-CM | POA: Diagnosis not present

## 2017-06-05 DIAGNOSIS — Z9049 Acquired absence of other specified parts of digestive tract: Secondary | ICD-10-CM | POA: Insufficient documentation

## 2017-06-05 DIAGNOSIS — F419 Anxiety disorder, unspecified: Secondary | ICD-10-CM | POA: Insufficient documentation

## 2017-06-05 DIAGNOSIS — Z79899 Other long term (current) drug therapy: Secondary | ICD-10-CM | POA: Diagnosis not present

## 2017-06-05 DIAGNOSIS — R079 Chest pain, unspecified: Secondary | ICD-10-CM | POA: Diagnosis not present

## 2017-06-05 DIAGNOSIS — E039 Hypothyroidism, unspecified: Secondary | ICD-10-CM | POA: Insufficient documentation

## 2017-06-05 DIAGNOSIS — I10 Essential (primary) hypertension: Secondary | ICD-10-CM | POA: Insufficient documentation

## 2017-06-05 DIAGNOSIS — J45909 Unspecified asthma, uncomplicated: Secondary | ICD-10-CM | POA: Diagnosis not present

## 2017-06-05 DIAGNOSIS — E119 Type 2 diabetes mellitus without complications: Secondary | ICD-10-CM | POA: Insufficient documentation

## 2017-06-05 DIAGNOSIS — Z7984 Long term (current) use of oral hypoglycemic drugs: Secondary | ICD-10-CM | POA: Insufficient documentation

## 2017-06-05 LAB — CBC WITH DIFFERENTIAL/PLATELET
Basophils Absolute: 0 10*3/uL (ref 0.0–0.1)
Basophils Relative: 0 %
Eosinophils Absolute: 0.1 10*3/uL (ref 0.0–0.7)
Eosinophils Relative: 2 %
HEMATOCRIT: 41.8 % (ref 36.0–46.0)
HEMOGLOBIN: 14.2 g/dL (ref 12.0–15.0)
LYMPHS PCT: 37 %
Lymphs Abs: 2.1 10*3/uL (ref 0.7–4.0)
MCH: 29.2 pg (ref 26.0–34.0)
MCHC: 34 g/dL (ref 30.0–36.0)
MCV: 86 fL (ref 78.0–100.0)
MONOS PCT: 10 %
Monocytes Absolute: 0.6 10*3/uL (ref 0.1–1.0)
NEUTROS PCT: 51 %
Neutro Abs: 3 10*3/uL (ref 1.7–7.7)
Platelets: 216 10*3/uL (ref 150–400)
RBC: 4.86 MIL/uL (ref 3.87–5.11)
RDW: 13.8 % (ref 11.5–15.5)
WBC: 5.8 10*3/uL (ref 4.0–10.5)

## 2017-06-05 LAB — COMPREHENSIVE METABOLIC PANEL
ALK PHOS: 48 U/L (ref 38–126)
ALT: 21 U/L (ref 14–54)
ANION GAP: 6 (ref 5–15)
AST: 26 U/L (ref 15–41)
Albumin: 4.2 g/dL (ref 3.5–5.0)
BILIRUBIN TOTAL: 0.5 mg/dL (ref 0.3–1.2)
BUN: 10 mg/dL (ref 6–20)
CALCIUM: 8.8 mg/dL — AB (ref 8.9–10.3)
CO2: 26 mmol/L (ref 22–32)
CREATININE: 1.07 mg/dL — AB (ref 0.44–1.00)
Chloride: 105 mmol/L (ref 101–111)
GFR calc Af Amer: >60 mL/min (ref >60)
GFR, EST NON AFRICAN AMERICAN: 59 mL/min — AB (ref >60)
GLUCOSE: 113 mg/dL — AB (ref 65–99)
Potassium: 3.4 mmol/L — ABNORMAL LOW (ref 3.5–5.1)
Sodium: 137 mmol/L (ref 135–145)
TOTAL PROTEIN: 7 g/dL (ref 6.5–8.1)

## 2017-06-05 LAB — LIPASE, BLOOD: Lipase: 23 U/L (ref 11–51)

## 2017-06-05 LAB — D-DIMER, QUANTITATIVE: D-Dimer, Quant: 0.28 ug/mL-FEU (ref 0.00–0.50)

## 2017-06-05 LAB — TROPONIN I
Troponin I: <0.03 ng/mL (ref <0.03)
Troponin I: <0.03 ng/mL (ref <0.03)

## 2017-06-05 MED ORDER — GI COCKTAIL ~~LOC~~
30.0000 mL | Freq: Once | ORAL | Status: AC
  Administered 2017-06-05: 30 mL via ORAL
  Filled 2017-06-05: qty 30

## 2017-06-05 NOTE — ED Triage Notes (Signed)
Epigastric pain on and off x 2 days. Pain is worse after she drinks liquids.

## 2017-06-05 NOTE — Telephone Encounter (Signed)
FYI to PCP

## 2017-06-05 NOTE — Telephone Encounter (Signed)
Pt called with chest pain that she experienced on Sunday and yesterday. He pain was so bad on Sunday it brought her to her knees and she saw black but did not pass out. She was exhausted after that and slept the rest of the day.  She has a cardiac hx of htn, and high cholesterol.  She has high anxiety but she said that this is different. She still has a gnawing feeling in her chest now. Per protocol, she is going to the emergency depart at [facility].  Will route this to [place] office and notify flow.  Reason for Disposition . Chest pain lasts > 5 minutes (Exceptions: chest pain occurring > 3 days ago and now asymptomatic; same as previously diagnosed heartburn and has accompanying sour taste in mouth)  Answer Assessment - Initial Assessment Questions 1. LOCATION: "Where does it hurt?"       Left chest  2. RADIATION: "Does the pain go anywhere else?" (e.g., into neck, jaw, arms, back)     Around the breast,  3. ONSET: "When did the chest pain begin?" (Minutes, hours or days)      Sunday, yesterday 4. PATTERN "Does the pain come and go, or has it been constant since it started?"  "Does it get worse with exertion?"      constant aching and gnawing pain now and occasional sharp 5. DURATION: "How long does it last" (e.g., seconds, minutes, hours)     3 mins 6. SEVERITY: "How bad is the pain?"  (e.g., Scale 1-10; mild, moderate, or severe)    - MILD (1-3): doesn't interfere with normal activities     - MODERATE (4-7): interferes with normal activities or awakens from sleep    - SEVERE (8-10): excruciating pain, unable to do any normal activities       Severe pain at the time 7. CARDIAC RISK FACTORS: "Do you have any history of heart problems or risk factors for heart disease?" (e.g., prior heart attack, angina; high blood pressure, diabetes, being overweight, high cholesterol, smoking, or strong family history of heart disease)    High b/p , high cholesterol, dad and  aunt with heart disease 8. PULMONARY RISK FACTORS: "Do you have any history of lung disease?"  (e.g., blood clots in lung, asthma, emphysema, birth control pills)    Estrogen patch 9. CAUSE: "What do you think is causing the chest pain?"     dont know 10. OTHER SYMPTOMS: "Do you have any other symptoms?" (e.g., dizziness, nausea, vomiting, sweating, fever, difficulty breathing, cough)       Dizziness (everything went dark), cough 11. PREGNANCY: "Is there any chance you are pregnant?" "When was your last menstrual period?"       On miringa  Protocols used: CHEST PAIN-A-AH

## 2017-06-05 NOTE — ED Provider Notes (Signed)
[facility] EMERGENCY DEPARTMENT Provider Note   CSN: [id number] Arrival date & time: 06/05/17  1117     History   Chief Complaint Chief Complaint  Patient presents with  . Chest Pain    HPI Michelle Mueller is a 52 y.o. female.  52 yo F with a chief complaint chest pain.  This been going on for the past couple days.  Seems to be worse after drinking.  Initially started a couple days ago and she drank some Sprite 0 she felt a burning in her chest that felt also like a pressure.  She denied radiation.  Lasted for about 3 minutes.  Was severe, she felt like she is going to pass out.  Then resolved spontaneously.  Has had at least 2 episodes since then.  Both were nonexertional.  One was at rest on the couch and another one was when she took a swallow of water.  She had one episode of emesis.  Denied diaphoresis or shortness of breath.  Denies lower extremity edema.  She denies history of PE or DVT.  Denies history of MI.  She has a history of hypertension hyperlipidemia and diabetes.  She denies smoking.  Her father had a heart attack but she is unsure how old he was when he had one.  The history is provided by the patient.  Chest Pain   This is a new problem. The current episode started 2 days ago. The problem occurs constantly. The problem has been gradually worsening. The pain is associated with rest and eating. The pain is present in the substernal region. The pain is at a severity of 7/10. The pain is mild. Duration of episode(s) is 2 days. Pertinent negatives include no dizziness, no fever, no headaches, no nausea, no palpitations, no shortness of breath and no vomiting. She has tried nothing for the symptoms. The treatment provided no relief.    Past Medical History:  Diagnosis Date  . Anxiety   . Arthritis   . Asthma    last attack 3 yrs ago  . Chicken pox   . Depression   . Diabetes mellitus without complication ([place])   . Fibromyalgia   . Goiter   . Hypertension    [name]  . Hypothyroidism   . Ovarian cyst   . PONV (postoperative nausea and vomiting)     Patient Active Problem List   Diagnosis Date Noted  . Hyperlipidemia associated with type 2 diabetes mellitus ([place]) 07/30/2016  . Chest pain of uncertain etiology 53/29/9242  . Essential hypertension 12/21/2014  . Myalgia 11/10/2014  . Insomnia 11/10/2014  . UTI (urinary tract infection) 08/04/2013  . Fibromyalgia muscle pain 06/13/2013  . Severe depression ([place]) 06/13/2013  . DM (diabetes mellitus) type II uncontrolled, periph vascular disorder ([place]) 06/13/2013  . Rheumatoid arthritis ([place]) 03/19/2013  . Fibromyalgia 03/19/2013  . Unspecified hypothyroidism 03/19/2013  . Goiter 03/19/2013  . Obesity (BMI 30-39.9) 03/19/2013  . Pseudoarthrosis of lumbar spine L4-L5 06/20/2012  . Family history of pancreatic cancer 01/18/2011  . Chronic diarrhea 01/18/2011    Past Surgical History:  Procedure Laterality Date  . ABDOMINAL EXPOSURE  12/18/2011   Procedure: ABDOMINAL EXPOSURE;  Surgeon: [name], MD;  Location: [facility] NEURO ORS;  Service: Vascular;  Laterality: N/A;  Anterior Expossure for anterior lumbar interbody fuson  . ANTERIOR LUMBAR FUSION  12/18/2011   Procedure: ANTERIOR LUMBAR FUSION 1 LEVEL;  Surgeon: [name], MD;  Location: [facility] NEURO ORS;  Service: Neurosurgery;  Laterality: N/A;  Lumbar four-five Anterior Lumbar Interbody Fusion with Dr. [name] to do anterior exposure  . CESAREAN SECTION    . CHOLECYSTECTOMY    . HERNIA REPAIR    . LIPOMA EXCISION     L breast  . TONSILLECTOMY    . TUMOR EXCISION     Nerve sheath tumor, R arm     OB History    Gravida  2   Para  2   Term      Preterm      AB      Living        SAB      TAB      Ectopic      Multiple      Live Births               Home Medications    Prior to Admission medications   Medication Sig Start Date End Date Taking? Authorizing Provider  atorvastatin (LIPITOR) 10 MG tablet Take 1  tablet (10 mg total) by mouth daily. 01/24/17   Carollee Herter, Alferd Apa, DO  buPROPion (WELLBUTRIN XL) 300 MG 24 hr tablet TAKE 1 TABLET(300 MG) BY MOUTH DAILY 05/10/17   Merian Capron, MD  busPIRone (BUSPAR) 10 MG tablet TAKE 1 TABLET BY MOUTH THREE TIMES DAILY 05/04/17   Merian Capron, MD  Calcium-Magnesium-Vitamin D (CALCIUM MAGNESIUM PO) Take 1 tablet by mouth daily.    [provider]  cetirizine (ZYRTEC) 5 MG tablet Take 5 mg by mouth daily.    [provider]  Cholecalciferol (VITAMIN D) 2000 units CAPS Take 1 capsule by mouth daily.    [provider]  cyclobenzaprine (FLEXERIL) 5 MG tablet  08/18/14   [provider]  estradiol (VIVELLE-DOT) 0.05 MG/24HR patch Place 1 patch (0.05 mg total) onto the skin 2 (two) times a week. May use generic 03/15/17   Guss Bunde, MD  gabapentin (NEURONTIN) 300 MG capsule Take 3 capsules by mouth at bedtime.  02/07/13   Clydell Hakim, MD  glucose blood (ONE TOUCH ULTRA TEST) test strip Test blood sugar once daily. Dx code: 250.00 03/26/13   Carollee Herter, Alferd Apa, DO  hydroxychloroquine (PLAQUENIL) 200 MG tablet Take 400 mg by mouth daily.  05/13/14   [provider]  lamoTRIgine (LAMICTAL) 200 MG tablet Can take one and half of 150mg  for now till finished those pills 05/10/17   Merian Capron, MD  lamoTRIgine (LAMICTAL) 25 MG tablet Take 1 tablet (25 mg total) by mouth daily. This is in addtion to 200mg  . Total dose is 225mg  05/10/17   Merian Capron, MD  leflunomide (ARAVA) 20 MG tablet Take by mouth. 06/14/16 06/14/17  [provider]  levonorgestrel (MIRENA) 20 MCG/24HR IUD 1 each by Intrauterine route once.    [provider]  levothyroxine (SYNTHROID, LEVOTHROID) 50 MCG tablet Take 1 tablet (50 mcg total) by mouth daily before breakfast. 01/24/17   Carollee Herter, Alferd Apa, DO  lisinopril (PRINIVIL,ZESTRIL) 5 MG tablet Take 1 tablet (5 mg total) by mouth daily. 01/28/16   Roma Schanz R, DO    lisinopril (PRINIVIL,ZESTRIL) 5 MG tablet TAKE 1 TABLET BY MOUTH EVERY DAY 04/13/17   Carollee Herter, Alferd Apa, DO  metFORMIN (GLUCOPHAGE-XR) 500 MG 24 hr tablet TAKE 1 TABLET BY MOUTH EVERY EVENING 04/24/17   Lowne Chase, Yvonne R, DO  METRONIDAZOLE, TOPICAL, 0.75 % LOTN Apply 1 application topically daily. 01/18/17   Ann Held, DO  Multiple  Vitamin (MULTIVITAMIN WITH MINERALS) TABS Take 1 tablet by mouth daily.    [provider]  naproxen sodium (ANAPROX) 220 MG tablet Take 220 mg by mouth.    [provider]  Jonetta Speak LANCETS 50K MISC Test blood sugar once daily. Dx code: 250.00 03/26/13   Carollee Herter, Alferd Apa, DO  OVER THE COUNTER MEDICATION Vitamin B-12 3000 mcg 1 table daily.    [provider]  OVER THE COUNTER MEDICATION Amberen 2 tablets every morning    [provider]  oxyCODONE 10 MG TABS Take 1 tablet (10 mg total) by mouth every 6 (six) hours as needed (for pain). 06/21/12   [name], MD  Phenylephrine-Acetaminophen (TYLENOL SINUS CONGESTION/PAIN PO) Take 2 tablets by mouth daily as needed (for sinus congestion).     [provider]  escitalopram (LEXAPRO) 10 MG tablet TAKE 1 AND 1/2 TABLETS(15 MG) BY MOUTH DAILY 11/05/16 11/09/16  Merian Capron, MD  FLUoxetine (PROZAC) 20 MG tablet Take 1 tablet (20 mg total) by mouth daily. 06/13/16 08/23/16  Merian Capron, MD    Family History Family History  Problem Relation Age of Onset  . Heart disease Father   . Lung cancer Father   . Colon cancer Maternal Aunt   . Pancreatic cancer Maternal Grandmother   . Pancreatic cancer Maternal Grandfather   . Pancreatic cancer Paternal Grandmother   . Cancer Paternal Grandfather   . Sexual abuse Neg Hx     Social History Social History   Tobacco Use  . Smoking status: Never Smoker  . Smokeless tobacco: Never Used  Substance Use Topics  . Alcohol use: No    Alcohol/week: 0.0 oz  . Drug use: No     Allergies    Erythromycin; Zocor  [simvastatin-high dose]; and Doxycycline   Review of Systems Review of Systems  Constitutional: Negative for chills and fever.  HENT: Negative for congestion and rhinorrhea.   Eyes: Negative for redness and visual disturbance.  Respiratory: Negative for shortness of breath and wheezing.   Cardiovascular: Positive for chest pain. Negative for palpitations.  Gastrointestinal: Negative for nausea and vomiting.  Genitourinary: Negative for dysuria and urgency.  Musculoskeletal: Negative for arthralgias and myalgias.  Skin: Negative for pallor and wound.  Neurological: Negative for dizziness and headaches.     Physical Exam Updated Vital Signs BP 119/76 (BP Location: Left Arm)   Pulse 89   Temp 98.4 F (36.9 C) (Oral)   Resp 18   Ht 5\' 5"  (1.651 m)   Wt 89.4 kg (197 lb)   SpO2 96%   BMI 32.78 kg/m   Physical Exam  Constitutional: She is oriented to person, place, and time. She appears well-developed and well-nourished. No distress.  HENT:  Head: Normocephalic and atraumatic.  Eyes: Pupils are equal, round, and reactive to light. EOM are normal.  Neck: Normal range of motion. Neck supple.  Cardiovascular: Normal rate and regular rhythm. Exam reveals no gallop and no friction rub.  No murmur heard. Pulmonary/Chest: Effort normal. She has no wheezes. She has no rales.  Abdominal: Soft. She exhibits no distension and no mass. There is tenderness ( Mild epigastric tenderness.  Tenderness worse to the left lower sternal border). There is no guarding.  Musculoskeletal: She exhibits no edema or tenderness.  Neurological: She is alert and oriented to person, place, and time.  Skin: Skin is warm and dry. She is not diaphoretic.  Psychiatric: She has a normal mood and affect. Her behavior is normal.  Nursing note and vitals reviewed.    ED Treatments / Results  Labs (all labs ordered are listed, but only abnormal results are displayed) Labs Reviewed   COMPREHENSIVE METABOLIC PANEL - Abnormal; Notable for the following components:      Result Value   Potassium 3.4 (*)    Glucose, Bld 113 (*)    Creatinine, Ser 1.07 (*)    Calcium 8.8 (*)    GFR calc non Af Amer 59 (*)    All other components within normal limits  CBC WITH DIFFERENTIAL/PLATELET  LIPASE, BLOOD  TROPONIN I  TROPONIN I  D-DIMER, QUANTITATIVE (NOT AT [facility])    EKG EKG Interpretation  Date/Time:  Tuesday Jun 05 2017 11:23:15 EDT Ventricular Rate:  94 PR Interval:  146 QRS Duration: 96 QT Interval:  358 QTC Calculation: 447 R Axis:   112 Text Interpretation:  Normal sinus rhythm Left posterior fascicular block T wave abnormality, consider inferior ischemia Abnormal ECG Otherwise no significant change Confirmed by Deno Etienne [phone]) on 06/05/2017 11:26:25 AM Also confirmed by Deno Etienne [phone]), editor Lynder Parents [phone])  on 06/06/2017 10:05:42 AM   Radiology Dg Chest 2 View  Result Date: 06/05/2017 CLINICAL DATA:  Intermittent chest pain. EXAM: CHEST - 2 VIEW COMPARISON:  December 21, 2014 FINDINGS: The heart size and mediastinal contours are within normal limits. Both lungs are clear. The visualized skeletal structures are unremarkable. IMPRESSION: No active cardiopulmonary disease. Electronically Signed   By: Dorise Bullion III M.D   On: 06/05/2017 12:30    Procedures Procedures (including critical care time)  Medications Ordered in ED Medications  gi cocktail (Maalox,Lidocaine,Donnatal) (30 mLs Oral Given 06/05/17 1527)     Initial Impression / Assessment and Plan / ED Course  I have reviewed the triage vital signs and the nursing notes.  Pertinent labs & imaging results that were available during my care of the patient were reviewed by me and considered in my medical decision making (see chart for details).     53 yo F with a chief complaint of chest pain.  This seems to get worse after she drinks.  It has happened spontaneously.  Nonexertional.  She  does have a history of rheumatoid arthritis as well as taking estrogen for hot flashes.  This puts her at high risk for a PE.  She does have a flipped T waves in lead III, this is been seen on a prior EKG.  Will obtain a d-dimer, delta troponin.  Initial troponin is unremarkable.  Chest x-ray reviewed by me with no focal infiltrate or pneumothorax.  Awaiting for second trop, ddimer.  See Dr. Wilma Flavin note for further details.   The patients results and plan were reviewed and discussed.   Any x-rays performed were independently reviewed by myself.   Differential diagnosis were considered with the presenting HPI.  Medications  gi cocktail (Maalox,Lidocaine,Donnatal) (30 mLs Oral Given 06/05/17 1527)    Vitals:   06/05/17 1337 06/05/17 1530 06/05/17 1600 06/05/17 1709  BP: 121/75 126/84 115/79 119/76  Pulse: 78 80 74 89  Resp: 18   18  Temp:      TempSrc:      SpO2: 100% 99% 97% 96%  Weight:      Height:        Final diagnoses:  Atypical chest pain         Final Clinical Impressions(s) / ED Diagnoses   Final diagnoses:  Atypical chest pain    ED Discharge Orders  None       Deno Etienne, DO 06/06/17 1504

## 2017-06-05 NOTE — ED Notes (Signed)
NAD at this time. Pt is stable and going home.  

## 2017-06-05 NOTE — ED Provider Notes (Signed)
I assumed care of this patient from Dr. Tyrone Nine at 1600.  Please see their note for further details of Hx, PE.  Briefly patient is a 52 y.o. female who presented with atypical nonexertional chest pain.  EKG with nonspecific T wave changes but positive S1 every 3 T3.  Patient with a history of rheumatoid arthritis and is on estrogen.  There was low suspicion for ACS with a reassuring EKG and negative troponin.  Plan will be to delta troponin to rule out ACS.  Also planning on obtaining a d-dimer to assess for possible blood clots..   Current plan is to follow-up lab results.  Delta troponin is negative.  D-dimer negative.  Results discussed with the patient.  The patient is safe for discharge with strict return precautions.  Disposition: Discharge  Condition: Good  I have discussed the results, Dx and Tx plan with the patient who expressed understanding and agree(s) with the plan. Discharge instructions discussed at great length. The patient was given strict return precautions who verbalized understanding of the instructions. No further questions at time of discharge.    ED Discharge Orders    None       Follow Up: Ann Held, DO [address] [place] [address] [phone]  Schedule an appointment as soon as possible for a visit  As needed      Bria Portales, Grayce Sessions, MD 06/05/17 1825

## 2017-06-06 ENCOUNTER — Telehealth: Payer: Self-pay | Admitting: *Deleted

## 2017-06-06 NOTE — Telephone Encounter (Signed)
Transition Care Management Follow-up Telephone Call   Date discharged?06-05-17   How have you been since you were released from the hospital? Fine. Resting.   Do you understand why you were in the hospital? yes   Do you understand the discharge instructions? yes   Where were you discharged to? home   Items Reviewed:  Medications reviewed: pt states no new meds  Allergies reviewed: yes  Dietary changes reviewed: yes  Referrals reviewed: yes   Functional Questionnaire:   Activities of Daily Living (ADLs):   She states they are independent in the following: ambulation, bathing and hygiene, feeding, continence, grooming, toileting and dressing States they require assistance with the following: na   Any transportation issues/concerns?: no   Any patient concerns? no   Confirmed importance and date/time of follow-up visits scheduled . Patient states she was told that it may be acid reflux and does not feel she needs appointment at this time. Pt reports she will call to schedule if anything changes or call 911 for emergency.  Confirmed with patient if condition begins to worsen call PCP or go to the ER.  Patient was given the office number and encouraged to call back with question or concerns.  : yes

## 2017-06-08 NOTE — Telephone Encounter (Signed)
Eddie Dibbles from [facility] called again, has not heard anything back regarding the charge. Please call [phone]

## 2017-06-11 ENCOUNTER — Telehealth: Payer: Self-pay | Admitting: *Deleted

## 2017-06-11 NOTE — Telephone Encounter (Signed)
Received request for Medical records from [place], forwarded to [place] for email/scan/SLS 05/13

## 2017-06-12 DIAGNOSIS — L709 Acne, unspecified: Secondary | ICD-10-CM | POA: Diagnosis not present

## 2017-06-12 DIAGNOSIS — L0292 Furuncle, unspecified: Secondary | ICD-10-CM | POA: Diagnosis not present

## 2017-06-18 DIAGNOSIS — I10 Essential (primary) hypertension: Secondary | ICD-10-CM | POA: Diagnosis not present

## 2017-06-18 DIAGNOSIS — M0600 Rheumatoid arthritis without rheumatoid factor, unspecified site: Secondary | ICD-10-CM | POA: Diagnosis not present

## 2017-06-18 DIAGNOSIS — Z6833 Body mass index (BMI) 33.0-33.9, adult: Secondary | ICD-10-CM | POA: Diagnosis not present

## 2017-06-18 DIAGNOSIS — M961 Postlaminectomy syndrome, not elsewhere classified: Secondary | ICD-10-CM | POA: Diagnosis not present

## 2017-06-21 DIAGNOSIS — Z79899 Other long term (current) drug therapy: Secondary | ICD-10-CM | POA: Diagnosis not present

## 2017-06-21 DIAGNOSIS — H524 Presbyopia: Secondary | ICD-10-CM | POA: Diagnosis not present

## 2017-06-21 DIAGNOSIS — M069 Rheumatoid arthritis, unspecified: Secondary | ICD-10-CM | POA: Diagnosis not present

## 2017-07-02 ENCOUNTER — Other Ambulatory Visit (HOSPITAL_COMMUNITY)
Admission: RE | Admit: 2017-07-02 | Discharge: 2017-07-02 | Disposition: A | Discharge status: Home / Self Care | Payer: PPO | Source: Ambulatory Visit | Attending: Obstetrics & Gynecology | Admitting: Obstetrics & Gynecology

## 2017-07-02 ENCOUNTER — Other Ambulatory Visit: Payer: Self-pay | Admitting: Obstetrics & Gynecology

## 2017-07-02 ENCOUNTER — Ambulatory Visit (INDEPENDENT_AMBULATORY_CARE_PROVIDER_SITE_OTHER): Payer: PPO | Admitting: Obstetrics & Gynecology

## 2017-07-02 ENCOUNTER — Encounter: Payer: Self-pay | Admitting: Obstetrics & Gynecology

## 2017-07-02 VITALS — BP 118/81 | HR 86 | Ht 65.0 in | Wt 195.0 lb

## 2017-07-02 DIAGNOSIS — Z01419 Encounter for gynecological examination (general) (routine) without abnormal findings: Secondary | ICD-10-CM | POA: Diagnosis not present

## 2017-07-02 DIAGNOSIS — N6311 Unspecified lump in the right breast, upper outer quadrant: Secondary | ICD-10-CM | POA: Diagnosis not present

## 2017-07-02 DIAGNOSIS — N6489 Other specified disorders of breast: Secondary | ICD-10-CM

## 2017-07-02 NOTE — Progress Notes (Signed)
Subjective:     Michelle Mueller is a 52 y.o. female here for a routine exam.  Current complaints: hot flashes are much better on increased dose of patch.  No vaginal bleeding; has Mirena.  Pt needs f/u for breast mass on left.  Wants to only see Dr. Telford Nab (see notes in Epic--can't find when she comes back for biopsy.  Pt seen by derm for acne and left breast skin lesion that waxes and wanes--pustule.   Gynecologic History No LMP recorded. (Menstrual status: IUD). Contraception: IUD Last Pap: 2016. Results were: normal Last mammogram: followed by breast center (in epic)  Obstetric History OB History  Gravida Para Term Preterm AB Living  2 2       2   SAB TAB Ectopic Multiple Live Births               # Outcome Date GA Lbr Len/2nd Weight Sex Delivery Anes PTL Lv  2 Para           1 Para              The following portions of the patient's history were reviewed and updated as appropriate: allergies, current medications, past family history, past medical history, past social history, past surgical history and problem list.  Review of Systems Pertinent items noted in HPI and remainder of comprehensive ROS otherwise negative.    Objective:      Vitals:   07/02/17 1442  BP: 118/81  Pulse: 86  Weight: 195 lb (88.5 kg)  Height: 5\' 5"  (1.651 m)   Vitals:  WNL General appearance: alert, cooperative and no distress  HEENT: Normocephalic, without obvious abnormality, atraumatic Eyes: negative Throat: lips, mucosa, and tongue normal; teeth and gums normal  Respiratory: Clear to auscultation bilaterally  CV: Regular rate and rhythm  Breasts:  Skin pustule left breast--followed by derm; using bactroban. Left breast mass 12 o'clock.  Mobile, non tender.  Followed by breast center. NEW right breast mass upp outer quadrant near axilla.  Feels similar to mass on left breast but smaller--diagnostic mammogram and sonogram ordered.  GI: Soft, non-tender; bowel sounds normal; no masses,  no  organomegaly  GU: External Genitalia:  Tanner V, no lesion Urethra:  No prolapse   Vagina: Pink, normal rugae, no blood or discharge  Cervix: No CMT, no lesion  Uterus:  Normal size and contour, non tender  Adnexa: Normal, no masses, non tender  Musculoskeletal: No edema, redness or tenderness in the calves or thighs  Skin: Pustule on left breast --using Bactroban  Lymphatic: Axillary adenopathy: none     Psychiatric: Normal mood and behavior        Assessment:    Healthy female exam.   Adult acne Right and left breast masses    Plan:    pap with co testing  diagnostic right mammogram and sono F/u with breast center for left breast issues continue HRT.  Got good relief.  Will ween next year.  Pt understands increased risk for breast cancer on HRT.  Will stay on lowest dose for least amount of time and titrate with symptoms.   F/U with dermatology for left breast lesion and acne.

## 2017-07-02 NOTE — Progress Notes (Signed)
Pt has no complaints today Last pap smear 07/2014- normal results Last mammogram 12/2016 (pt gets them every 6 months- scheduling next one today)

## 2017-07-04 LAB — CYTOLOGY - PAP
Diagnosis: NEGATIVE
HPV: NOT DETECTED

## 2017-07-05 ENCOUNTER — Ambulatory Visit
Admission: RE | Admit: 2017-07-05 | Discharge: 2017-07-05 | Disposition: A | Discharge status: Home / Self Care | Payer: PPO | Source: Ambulatory Visit | Attending: Obstetrics & Gynecology | Admitting: Obstetrics & Gynecology

## 2017-07-05 DIAGNOSIS — N6489 Other specified disorders of breast: Secondary | ICD-10-CM | POA: Diagnosis not present

## 2017-07-05 DIAGNOSIS — N6311 Unspecified lump in the right breast, upper outer quadrant: Secondary | ICD-10-CM

## 2017-07-05 DIAGNOSIS — N6310 Unspecified lump in the right breast, unspecified quadrant: Secondary | ICD-10-CM | POA: Diagnosis not present

## 2017-07-05 DIAGNOSIS — R928 Other abnormal and inconclusive findings on diagnostic imaging of breast: Secondary | ICD-10-CM | POA: Diagnosis not present

## 2017-07-11 ENCOUNTER — Other Ambulatory Visit: Payer: Self-pay | Admitting: Family Medicine

## 2017-07-11 DIAGNOSIS — I10 Essential (primary) hypertension: Secondary | ICD-10-CM

## 2017-07-12 ENCOUNTER — Other Ambulatory Visit (HOSPITAL_COMMUNITY): Payer: Self-pay | Admitting: Psychiatry

## 2017-07-18 ENCOUNTER — Other Ambulatory Visit: Payer: Self-pay | Admitting: Family Medicine

## 2017-07-18 DIAGNOSIS — E039 Hypothyroidism, unspecified: Secondary | ICD-10-CM

## 2017-07-19 ENCOUNTER — Encounter: Payer: Self-pay | Admitting: Family Medicine

## 2017-07-19 ENCOUNTER — Ambulatory Visit (INDEPENDENT_AMBULATORY_CARE_PROVIDER_SITE_OTHER): Payer: PPO | Admitting: Family Medicine

## 2017-07-19 ENCOUNTER — Other Ambulatory Visit: Payer: Self-pay | Admitting: *Deleted

## 2017-07-19 VITALS — BP 115/69 | HR 78 | Temp 98.7°F | Resp 16 | Ht 63.8 in | Wt 199.0 lb

## 2017-07-19 DIAGNOSIS — E039 Hypothyroidism, unspecified: Secondary | ICD-10-CM | POA: Diagnosis not present

## 2017-07-19 DIAGNOSIS — M059 Rheumatoid arthritis with rheumatoid factor, unspecified: Secondary | ICD-10-CM

## 2017-07-19 DIAGNOSIS — I10 Essential (primary) hypertension: Secondary | ICD-10-CM

## 2017-07-19 DIAGNOSIS — M0609 Rheumatoid arthritis without rheumatoid factor, multiple sites: Secondary | ICD-10-CM

## 2017-07-19 DIAGNOSIS — Z23 Encounter for immunization: Secondary | ICD-10-CM | POA: Diagnosis not present

## 2017-07-19 DIAGNOSIS — E1151 Type 2 diabetes mellitus with diabetic peripheral angiopathy without gangrene: Secondary | ICD-10-CM

## 2017-07-19 DIAGNOSIS — E785 Hyperlipidemia, unspecified: Secondary | ICD-10-CM

## 2017-07-19 DIAGNOSIS — Z1211 Encounter for screening for malignant neoplasm of colon: Secondary | ICD-10-CM

## 2017-07-19 DIAGNOSIS — Z0000 Encounter for general adult medical examination without abnormal findings: Secondary | ICD-10-CM | POA: Diagnosis not present

## 2017-07-19 LAB — CBC WITH DIFFERENTIAL/PLATELET
Basophils Absolute: 0 10*3/uL (ref 0.0–0.1)
Basophils Relative: 0.6 % (ref 0.0–3.0)
EOS PCT: 2 % (ref 0.0–5.0)
Eosinophils Absolute: 0.2 10*3/uL (ref 0.0–0.7)
HCT: 39.3 % (ref 36.0–46.0)
HEMOGLOBIN: 13 g/dL (ref 12.0–15.0)
LYMPHS PCT: 27 % (ref 12.0–46.0)
Lymphs Abs: 2.3 10*3/uL (ref 0.7–4.0)
MCHC: 33 g/dL (ref 30.0–36.0)
MCV: 85.8 fl (ref 78.0–100.0)
MONO ABS: 0.7 10*3/uL (ref 0.1–1.0)
Monocytes Relative: 8.9 % (ref 3.0–12.0)
Neutro Abs: 5.1 10*3/uL (ref 1.4–7.7)
Neutrophils Relative %: 61.5 % (ref 43.0–77.0)
Platelets: 237 10*3/uL (ref 150.0–400.0)
RBC: 4.58 Mil/uL (ref 3.87–5.11)
RDW: 14.3 % (ref 11.5–15.5)
WBC: 8.3 10*3/uL (ref 4.0–10.5)

## 2017-07-19 LAB — COMPREHENSIVE METABOLIC PANEL
ALBUMIN: 4.1 g/dL (ref 3.5–5.2)
ALK PHOS: 53 U/L (ref 39–117)
ALT: 33 U/L (ref 0–35)
AST: 37 U/L (ref 0–37)
BUN: 10 mg/dL (ref 6–23)
CO2: 30 mEq/L (ref 19–32)
CREATININE: 0.9 mg/dL (ref 0.40–1.20)
Calcium: 8.6 mg/dL (ref 8.4–10.5)
Chloride: 103 mEq/L (ref 96–112)
GFR: 70.34 mL/min (ref >60.00)
Glucose, Bld: 76 mg/dL (ref 70–99)
POTASSIUM: 3.9 meq/L (ref 3.5–5.1)
SODIUM: 138 meq/L (ref 135–145)
TOTAL PROTEIN: 6.1 g/dL (ref 6.0–8.3)
Total Bilirubin: 0.5 mg/dL (ref 0.2–1.2)

## 2017-07-19 LAB — LIPID PANEL
CHOLESTEROL: 134 mg/dL (ref 0–200)
HDL: 66.7 mg/dL (ref >39.00)
LDL Cholesterol: 52 mg/dL (ref 0–99)
NonHDL: 67.37
Total CHOL/HDL Ratio: 2
Triglycerides: 76 mg/dL (ref 0.0–149.0)
VLDL: 15.2 mg/dL (ref 0.0–40.0)

## 2017-07-19 LAB — HEMOGLOBIN A1C: HEMOGLOBIN A1C: 5 % (ref 4.6–6.5)

## 2017-07-19 LAB — TSH: TSH: 1.96 u[IU]/mL (ref 0.35–4.50)

## 2017-07-19 NOTE — Patient Instructions (Signed)
Preventive Care 40-64 Years, Female Preventive care refers to lifestyle choices and visits with your health care provider that can promote health and wellness. What does preventive care include?  A yearly physical exam. This is also called an annual well check.  Dental exams once or twice a year.  Routine eye exams. Ask your health care provider how often you should have your eyes checked.  Personal lifestyle choices, including: ? Daily care of your teeth and gums. ? Regular physical activity. ? Eating a healthy diet. ? Avoiding tobacco and drug use. ? Limiting alcohol use. ? Practicing safe sex. ? Taking low-dose aspirin daily starting at age 58. ? Taking vitamin and mineral supplements as recommended by your health care provider. What happens during an annual well check? The services and screenings done by your health care provider during your annual well check will depend on your age, overall health, lifestyle risk factors, and family history of disease. Counseling Your health care provider may ask you questions about your:  Alcohol use.  Tobacco use.  Drug use.  Emotional well-being.  Home and relationship well-being.  Sexual activity.  Eating habits.  Work and work Statistician.  Method of birth control.  Menstrual cycle.  Pregnancy history.  Screening You may have the following tests or measurements:  Height, weight, and BMI.  Blood pressure.  Lipid and cholesterol levels. These may be checked every 5 years, or more frequently if you are over 81 years old.  Skin check.  Lung cancer screening. You may have this screening every year starting at age 78 if you have a 30-pack-year history of smoking and currently smoke or have quit within the past 15 years.  Fecal occult blood test (FOBT) of the stool. You may have this test every year starting at age 65.  Flexible sigmoidoscopy or colonoscopy. You may have a sigmoidoscopy every 5 years or a colonoscopy  every 10 years starting at age 30.  Hepatitis C blood test.  Hepatitis B blood test.  Sexually transmitted disease (STD) testing.  Diabetes screening. This is done by checking your blood sugar (glucose) after you have not eaten for a while (fasting). You may have this done every 1-3 years.  Mammogram. This may be done every 1-2 years. Talk to your health care provider about when you should start having regular mammograms. This may depend on whether you have a family history of breast cancer.  BRCA-related cancer screening. This may be done if you have a family history of breast, ovarian, tubal, or peritoneal cancers.  Pelvic exam and Pap test. This may be done every 3 years starting at age 80. Starting at age 36, this may be done every 5 years if you have a Pap test in combination with an HPV test.  Bone density scan. This is done to screen for osteoporosis. You may have this scan if you are at high risk for osteoporosis.  Discuss your test results, treatment options, and if necessary, the need for more tests with your health care provider. Vaccines Your health care provider may recommend certain vaccines, such as:  Influenza vaccine. This is recommended every year.  Tetanus, diphtheria, and acellular pertussis (Tdap, Td) vaccine. You may need a Td booster every 10 years.  Varicella vaccine. You may need this if you have not been vaccinated.  Zoster vaccine. You may need this after age 5.  Measles, mumps, and rubella (MMR) vaccine. You may need at least one dose of MMR if you were born in  1955 or later. You may also need a second dose.  Pneumococcal 13-valent conjugate (PCV13) vaccine. You may need this if you have certain conditions and were not previously vaccinated.  Pneumococcal polysaccharide (PPSV23) vaccine. You may need one or two doses if you smoke cigarettes or if you have certain conditions.  Meningococcal vaccine. You may need this if you have certain  conditions.  Hepatitis A vaccine. You may need this if you have certain conditions or if you travel or work in places where you may be exposed to hepatitis A.  Hepatitis B vaccine. You may need this if you have certain conditions or if you travel or work in places where you may be exposed to hepatitis B.  Haemophilus influenzae type b (Hib) vaccine. You may need this if you have certain conditions.  Talk to your health care provider about which screenings and vaccines you need and how often you need them. This information is not intended to replace advice given to you by your health care provider. Make sure you discuss any questions you have with your health care provider. Document Released: 02/12/2015 Document Revised: 10/06/2015 Document Reviewed: 11/17/2014 Elsevier Interactive Patient Education  2018 [employer]  

## 2017-07-19 NOTE — Progress Notes (Signed)
Subjective:     Michelle Mueller is a 52 y.o. female and is here for a comprehensive physical exam. The patient reports  Social History   Socioeconomic History  . Marital status: Divorced    Spouse name: Not on file  . Number of children: Not on file  . Years of education: Not on file  . Highest education level: Not on file  Occupational History  . Not on file  Social Needs  . Financial resource strain: Not on file  . Food insecurity:    Worry: Not on file    Inability: Not on file  . Transportation needs:    Medical: Not on file    Non-medical: Not on file  Tobacco Use  . Smoking status: Never Smoker  . Smokeless tobacco: Never Used  Substance and Sexual Activity  . Alcohol use: No    Alcohol/week: 0.0 oz  . Drug use: No  . Sexual activity: Not Currently    Birth control/protection: IUD, None  Lifestyle  . Physical activity:    Days per week: Not on file    Minutes per session: Not on file  . Stress: Not on file  Relationships  . Social connections:    Talks on phone: Not on file    Gets together: Not on file    Attends religious service: Not on file    Active member of club or organization: Not on file    Attends meetings of clubs or organizations: Not on file    Relationship status: Not on file  . Intimate partner violence:    Fear of current or ex partner: Not on file    Emotionally abused: Not on file    Physically abused: Not on file    Forced sexual activity: Not on file  Other Topics Concern  . Not on file  Social History Narrative  . Not on file   Health Maintenance  Topic Date Due  . OPHTHALMOLOGY EXAM  03/15/1977  . TETANUS/TDAP  03/15/1986  . FOOT EXAM  11/10/2015  . PNEUMOCOCCAL POLYSACCHARIDE VACCINE (2) 12/19/2016  . COLONOSCOPY  03/15/2017  . HEMOGLOBIN A1C  07/19/2017  . INFLUENZA VACCINE  08/30/2017  . MAMMOGRAM  07/06/2018  . PAP SMEAR  07/02/2020  . HIV Screening  Completed    The following portions of the patient's history were  reviewed and updated as appropriate:  She  has a past medical history of Anxiety, Arthritis, Asthma, Chicken pox, Depression, Diabetes mellitus without complication ([place]), Fibromyalgia, Goiter, Hypertension, Hypothyroidism, Ovarian cyst, and PONV (postoperative nausea and vomiting). She does not have any pertinent problems on file. She  has a past surgical history that includes Cholecystectomy; Cesarean section; Tonsillectomy; Hernia repair; Lipoma excision; Tumor excision; Anterior lumbar fusion (12/18/2011); and Abdominal exposure (12/18/2011). Her family history includes Cancer in her paternal grandfather; Colon cancer in her maternal aunt; Heart disease in her father; Lung cancer in her father; Pancreatic cancer in her maternal grandfather, maternal grandmother, and paternal grandmother. She  reports that she has never smoked. She has never used smokeless tobacco. She reports that she does not drink alcohol or use drugs. She has a current medication list which includes the following prescription(s): atorvastatin, bupropion, buspirone, calcium-magnesium-vitamin d, cetirizine, vitamin d, cyclobenzaprine, estradiol, gabapentin, glucose blood, hydroxychloroquine, lamotrigine, lamotrigine, levonorgestrel, levothyroxine, lisinopril, lisinopril, metformin, multivitamin with minerals, mupirocin ointment, naproxen sodium, onetouch delica lancets 73U, OVER THE COUNTER MEDICATION, OVER THE COUNTER MEDICATION, oxycodone hcl, phenylephrine-acetaminophen, tretinoin, leflunomide, escitalopram, and fluoxetine. Current Outpatient Medications  on File Prior to Visit  Medication Sig Dispense Refill  . atorvastatin (LIPITOR) 10 MG tablet Take 1 tablet (10 mg total) by mouth daily. 90 tablet 1  . buPROPion (WELLBUTRIN XL) 300 MG 24 hr tablet TAKE 1 TABLET(300 MG) BY MOUTH DAILY 90 tablet 0  . busPIRone (BUSPAR) 10 MG tablet TAKE 1 TABLET BY MOUTH THREE TIMES DAILY 270 tablet 0  . Calcium-Magnesium-Vitamin D (CALCIUM  MAGNESIUM PO) Take 1 tablet by mouth daily.    . cetirizine (ZYRTEC) 5 MG tablet Take 5 mg by mouth daily.    . Cholecalciferol (VITAMIN D) 2000 units CAPS Take 1 capsule by mouth daily.    . cyclobenzaprine (FLEXERIL) 5 MG tablet   1  . estradiol (VIVELLE-DOT) 0.05 MG/24HR patch Place 1 patch (0.05 mg total) onto the skin 2 (two) times a week. May use generic 24 patch 4  . gabapentin (NEURONTIN) 300 MG capsule Take 3 capsules by mouth at bedtime.     [name] glucose blood (ONE TOUCH ULTRA TEST) test strip Test blood sugar once daily. Dx code: 250.00 100 each 12  . hydroxychloroquine (PLAQUENIL) 200 MG tablet Take 400 mg by mouth daily.   2  . lamoTRIgine (LAMICTAL) 200 MG tablet Can take one and half of 150mg  for now till finished those pills 90 tablet 0  . lamoTRIgine (LAMICTAL) 25 MG tablet Take 1 tablet (25 mg total) by mouth daily. This is in addtion to 200mg  . Total dose is 225mg  90 tablet 0  . levonorgestrel (MIRENA) 20 MCG/24HR IUD 1 each by Intrauterine route once.    [name] levothyroxine (SYNTHROID, LEVOTHROID) 50 MCG tablet TAKE 1 TABLET(50 MCG) BY MOUTH DAILY BEFORE BREAKFAST 90 tablet 0  . lisinopril (PRINIVIL,ZESTRIL) 5 MG tablet Take 1 tablet (5 mg total) by mouth daily. 90 tablet 0  . lisinopril (PRINIVIL,ZESTRIL) 5 MG tablet TAKE 1 TABLET BY MOUTH EVERY DAY 90 tablet 0  . metFORMIN (GLUCOPHAGE-XR) 500 MG 24 hr tablet TAKE 1 TABLET BY MOUTH EVERY EVENING 90 tablet 0  . Multiple Vitamin (MULTIVITAMIN WITH MINERALS) TABS Take 1 tablet by mouth daily.    . mupirocin ointment (BACTROBAN) 2 % APPLY TO LESION ON LEFT BREAST TWICE DAILY FOR 14 DAYS  0  . naproxen sodium (ANAPROX) 220 MG tablet Take 220 mg by mouth.    Glory Rosebush DELICA LANCETS 40C MISC Test blood sugar once daily. Dx code: 250.00 100 each 12  . OVER THE COUNTER MEDICATION Vitamin B-12 3000 mcg 1 table daily.    [name] OVER THE COUNTER MEDICATION Amberen 2 tablets every morning    . oxyCODONE 10 MG TABS Take 1 tablet (10 mg total) by  mouth every 6 (six) hours as needed (for pain). 60 tablet 0  . Phenylephrine-Acetaminophen (TYLENOL SINUS CONGESTION/PAIN PO) Take 2 tablets by mouth daily as needed (for sinus congestion).     . tretinoin (RETIN-A) 0.025 % gel Apply 3 nights weekly x 2 weeks can increase to nightly only as tolerated    . leflunomide (ARAVA) 20 MG tablet Take by mouth.    . [DISCONTINUED] escitalopram (LEXAPRO) 10 MG tablet TAKE 1 AND 1/2 TABLETS(15 MG) BY MOUTH DAILY 45 tablet 0  . [DISCONTINUED] FLUoxetine (PROZAC) 20 MG tablet Take 1 tablet (20 mg total) by mouth daily. 90 tablet 0   No current facility-administered medications on file prior to visit.    She is allergic to erythromycin; zocor  [simvastatin-high dose]; doxycycline; and peroxide [hydrogen peroxide]..  Review of Systems Review  of Systems  Constitutional: Negative for activity change, appetite change and fatigue.  HENT: Negative for hearing loss, congestion, tinnitus and ear discharge.  dentist q70m Eyes: Negative for visual disturbance (see optho q1y -- vision corrected to 20/20 with glasses).  Respiratory: Negative for cough, chest tightness and shortness of breath.   Cardiovascular: Negative for chest pain, palpitations and leg swelling.  Gastrointestinal: Negative for abdominal pain, diarrhea, constipation and abdominal distention.  Genitourinary: Negative for urgency, frequency, decreased urine volume and difficulty urinating.  Musculoskeletal: Negative for back pain, arthralgias and gait problem.  Skin: Negative for color change, pallor and rash.  Neurological: Negative for dizziness, light-headedness, numbness and headaches.  Hematological: Negative for adenopathy. Does not bruise/bleed easily.  Psychiatric/Behavioral: Negative for suicidal ideas, confusion, sleep disturbance, self-injury, dysphoric mood, decreased concentration and agitation.       Objective:    BP 115/69 (BP Location: Right Arm, Cuff Size: Large)   Pulse 78    Temp 98.7 F (37.1 C) (Oral)   Resp 16   Ht 5' 3.8" (1.621 m)   Wt 199 lb (90.3 kg)   SpO2 98%   BMI 34.37 kg/m  General appearance: alert, cooperative, appears stated age and no distress Head: Normocephalic, without obvious abnormality, atraumatic Eyes: conjunctivae/corneas clear. PERRL, EOM's intact. Fundi benign. Ears: normal TM's and external ear canals both ears Nose: Nares normal. Septum midline. Mucosa normal. No drainage or sinus tenderness. Throat: lips, mucosa, and tongue normal; teeth and gums normal Neck: no adenopathy, no carotid bruit, no JVD, supple, symmetrical, trachea midline and thyroid not enlarged, symmetric, no tenderness/mass/nodules Back: symmetric, no curvature. ROM normal. No CVA tenderness. Lungs: clear to auscultation bilaterally Breasts: gyn Heart: regular rate and rhythm, S1, S2 normal, no murmur, click, rub or gallop Abdomen: soft, non-tender; bowel sounds normal; no masses,  no organomegaly Pelvic: deferred---gyn Extremities: extremities normal, atraumatic, no cyanosis or edema Pulses: 2+ and symmetric Skin: Skin color, texture, turgor normal. No rashes or lesions Lymph nodes: Cervical, supraclavicular, and axillary nodes normal. Neurologic: Alert and oriented X 3, normal strength and tone. Normal symmetric reflexes. Normal coordination and gait    Assessment:    Healthy female exam.      Plan:    ghm utd Check labs  See After Visit Summary for Counseling Recommendations    1. Hypothyroidism, unspecified type con't med-- check lab  - TSH  2. DM (diabetes mellitus) type II, controlled, with peripheral vascular disorder ([place]) [id number] to be checked , minimize simple carbs. Increase exercise as tolerated. Continue current meds - Comprehensive metabolic panel - Hemoglobin A1c  3. Essential hypertension Well controlled, no changes to meds. Encouraged heart healthy diet such as the DASH diet and exercise as tolerated.  - CBC with  Differential/Platelet - Comprehensive metabolic panel - Lipid panel - TSH - Hemoglobin A1c  4. Hyperlipidemia LDL goal <70 Encouraged heart healthy diet, increase exercise, avoid trans fats, consider a krill oil cap daily - Comprehensive metabolic panel - Lipid panel

## 2017-07-20 NOTE — Addendum Note (Signed)
Addended by: Kem Boroughs D on: 07/20/2017 04:59 PM   Modules accepted: Orders

## 2017-07-21 ENCOUNTER — Other Ambulatory Visit: Payer: Self-pay | Admitting: Family Medicine

## 2017-07-21 DIAGNOSIS — IMO0001 Reserved for inherently not codable concepts without codable children: Secondary | ICD-10-CM

## 2017-07-21 DIAGNOSIS — E785 Hyperlipidemia, unspecified: Secondary | ICD-10-CM

## 2017-07-21 DIAGNOSIS — E1165 Type 2 diabetes mellitus with hyperglycemia: Principal | ICD-10-CM

## 2017-07-21 DIAGNOSIS — E119 Type 2 diabetes mellitus without complications: Secondary | ICD-10-CM

## 2017-07-26 ENCOUNTER — Other Ambulatory Visit: Payer: Self-pay

## 2017-07-30 ENCOUNTER — Other Ambulatory Visit: Payer: Self-pay

## 2017-07-31 ENCOUNTER — Ambulatory Visit (INDEPENDENT_AMBULATORY_CARE_PROVIDER_SITE_OTHER): Payer: PPO | Admitting: Psychiatry

## 2017-07-31 ENCOUNTER — Other Ambulatory Visit: Payer: Self-pay

## 2017-07-31 ENCOUNTER — Encounter (HOSPITAL_COMMUNITY): Payer: Self-pay | Admitting: Psychiatry

## 2017-07-31 VITALS — BP 132/80 | HR 86 | Ht 63.8 in | Wt 196.0 lb

## 2017-07-31 DIAGNOSIS — F331 Major depressive disorder, recurrent, moderate: Secondary | ICD-10-CM

## 2017-07-31 DIAGNOSIS — F411 Generalized anxiety disorder: Secondary | ICD-10-CM

## 2017-07-31 DIAGNOSIS — G8929 Other chronic pain: Secondary | ICD-10-CM | POA: Diagnosis not present

## 2017-07-31 DIAGNOSIS — M549 Dorsalgia, unspecified: Secondary | ICD-10-CM

## 2017-07-31 MED ORDER — LAMOTRIGINE 200 MG PO TABS: ORAL_TABLET | ORAL | 0 refills | Status: DC

## 2017-07-31 MED ORDER — BUSPIRONE HCL 10 MG PO TABS: ORAL_TABLET | ORAL | 0 refills | Status: DC

## 2017-07-31 MED ORDER — LAMOTRIGINE 25 MG PO TABS: 50.0000 mg | ORAL_TABLET | Freq: Every day | ORAL | 1 refills | Status: DC

## 2017-07-31 NOTE — Progress Notes (Signed)
Patient ID: [name], female   DOB: [birth year], 52 y.o.   MRN: [id number]  Psychiatric Outpatient Follow up Visit  Patient Identification: Michelle Mueller MRN:  [id number] Date of Evaluation:  07/31/2017 Referral Source: [name] Chief Complaint:   Chief Complaint    Follow-up; Other     Visit Diagnosis: MDD. GAD . Mood related to Chronic back pain Diagnosis:   Patient Active Problem List   Diagnosis Date Noted  . Hyperlipidemia associated with type 2 diabetes mellitus ([place]) [E11.69, E78.5] 07/30/2016  . Rheumatoid arthritis of multiple sites with negative rheumatoid factor ([place]) [M06.09] 03/13/2016  . Chest pain of uncertain etiology [Y86.57] 12/21/2014  . Essential hypertension [I10] 12/21/2014  . Myalgia [M79.10] 11/10/2014  . Insomnia [G47.00] 11/10/2014  . UTI (urinary tract infection) [N39.0] 08/04/2013  . Fibromyalgia muscle pain [M79.7] 06/13/2013  . Severe depression ([place]) [F32.2] 06/13/2013  . DM (diabetes mellitus) type II uncontrolled, periph vascular disorder ([place]) [E11.51, E11.65] 06/13/2013  . Rheumatoid arthritis ([place]) [M06.9] 03/19/2013  . Fibromyalgia [M79.7] 03/19/2013  . Unspecified hypothyroidism [E03.9] 03/19/2013  . Goiter [E04.9] 03/19/2013  . Obesity (BMI 30-39.9) [E66.9] 03/19/2013  . 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 Family history of pancreatic cancer [Z80.0] 01/18/2011  . Chronic diarrhea [K52.9] 01/18/2011   History of Present Illness:   Patient returns for follow up visit  suffers from RA . Pain effects mood . Currently on steroid taper that give energy but then she feels blah and depressed if pain persist  providers woking on possible new treamtent of RA down the path   On pain meds.  Depression fluctuates. No rash. Current dose of lamictal 225 but takes all at night   Son going to college   Modifying factor: kids Severity of depression: 6/10 Timing: pain related  associated symptoms: back pain   Past Medical History:  Past Medical History:  Diagnosis Date  . Anxiety   . Arthritis   . Asthma    last attack 3 yrs ago  . Chicken pox   . Depression   . Diabetes mellitus without complication ([place])   . Fibromyalgia   . Goiter   . Hypertension    [name]  . Hypothyroidism   . Ovarian cyst   . PONV (postoperative nausea and vomiting)     Past Surgical History:  Procedure Laterality Date  . ABDOMINAL EXPOSURE  12/18/2011   Procedure: ABDOMINAL EXPOSURE;  Surgeon: [name], MD;  Location: [facility];  Service: Vascular;  Laterality: N/A;  Anterior Expossure for anterior lumbar interbody fuson  . ANTERIOR LUMBAR FUSION  12/18/2011   Procedure: ANTERIOR LUMBAR FUSION 1 LEVEL;  Surgeon: [name], MD;  Location: [facility];  Service: Neurosurgery;  Laterality: N/A;  Lumbar four-five Anterior Lumbar Interbody Fusion with Dr. [name] to do anterior exposure  . CESAREAN SECTION    . CHOLECYSTECTOMY    . HERNIA REPAIR    . LIPOMA EXCISION     L breast  . TONSILLECTOMY    . TUMOR EXCISION     Nerve sheath tumor, R arm   Family History:  Family History  Problem Relation Age of Onset  . Heart disease Father   . Lung cancer Father   . Colon cancer Maternal Aunt   . Pancreatic cancer Maternal Grandmother   . Pancreatic cancer Maternal Grandfather   . Pancreatic cancer Paternal Grandmother   . Cancer Paternal Grandfather   . Sexual abuse Neg Hx  Social History:   Social History   Socioeconomic History  . Marital status: Divorced    Spouse name: Not on file  . Number of children: Not on file  . Years of education: Not on file  . Highest education level: Not on file  Occupational History  . Not on file  Social Needs  . Financial resource strain: Not on file  . Food insecurity:    Worry: Not on file    Inability: Not on file  . Transportation needs:    Medical: Not on file    Non-medical: Not on file  Tobacco Use  . Smoking status: Never Smoker  .  Smokeless tobacco: Never Used  Substance and Sexual Activity  . Alcohol use: No    Alcohol/week: 0.0 oz  . Drug use: No  . Sexual activity: Not Currently    Birth control/protection: IUD, None  Lifestyle  . Physical activity:    Days per week: Not on file    Minutes per session: Not on file  . Stress: Not on file  Relationships  . Social connections:    Talks on phone: Not on file    Gets together: Not on file    Attends religious service: Not on file    Active member of club or organization: Not on file    Attends meetings of clubs or organizations: Not on file    Relationship status: Not on file  Other Topics Concern  . Not on file  Social History Narrative  . Not on file      Psychiatric Specialty Exam:     Review of Systems  Constitutional: Negative for fever.  Cardiovascular: Negative for chest pain and palpitations.  Musculoskeletal: Positive for joint pain.  Neurological: Negative for tremors.  Psychiatric/Behavioral: Positive for depression. Negative for substance abuse and suicidal ideas. The patient does not have insomnia.     Blood pressure 132/80, pulse 86, height 5' 3.8" (1.621 m), weight 196 lb (88.9 kg).Body mass index is 33.85 kg/m.  General Appearance: Well Groomed  Eye Contact:  Good  Speech:  Clear and Coherent  Volume:  Normal  Mood: subdued  Affect: congruent  Thought Process:  Coherent, Goal Directed, Intact, Linear and Logical  Orientation:  Full (Time, Place, and Person)  Thought Content:  WDL  Suicidal Thoughts:  No  Homicidal Thoughts:  No  Memory:  Immediate;   Good Recent;   Good Remote;   Good  Judgement:  Good  Insight:  Present  Psychomotor Activity:  Decreased  Concentration:  Fair  Recall:  Good  Fund of Knowledge:Good  Language: Good  Akathisia:  No  Handed:  Left  AIMS (if indicated):    Assets:  Communication Skills Desire for Improvement Financial Resources/Insurance Housing Resilience Transportation  ADL's:   Intact  Cognition: WNL  Sleep:  poor    Allergies:   Allergies  Allergen Reactions  . Erythromycin Other (See Comments)    arrhythmia  . Zocor  [Simvastatin-High Dose] Rash  . Doxycycline Other (See Comments)    Decreased BP and caused dizziness per patient  . Peroxide [Hydrogen Peroxide]    Current Medications: Current Outpatient Medications  Medication Sig Dispense Refill  . atorvastatin (LIPITOR) 10 MG tablet TAKE 1 TABLET(10 MG) BY MOUTH DAILY 90 tablet 0  . buPROPion (WELLBUTRIN XL) 300 MG 24 hr tablet TAKE 1 TABLET(300 MG) BY MOUTH DAILY 90 tablet 0  . busPIRone (BUSPAR) 10 MG tablet TAKE 1 TABLET BY MOUTH THREE TIMES DAILY  180 tablet 0  . Calcium-Magnesium-Vitamin D (CALCIUM MAGNESIUM PO) Take 1 tablet by mouth daily.    . cetirizine (ZYRTEC) 5 MG tablet Take 5 mg by mouth daily.    . Cholecalciferol (VITAMIN D) 2000 units CAPS Take 1 capsule by mouth daily.    . cyclobenzaprine (FLEXERIL) 5 MG tablet   1  . estradiol (VIVELLE-DOT) 0.05 MG/24HR patch Place 1 patch (0.05 mg total) onto the skin 2 (two) times a week. May use generic 24 patch 4  . gabapentin (NEURONTIN) 300 MG capsule Take 3 capsules by mouth at bedtime.     [name] glucose blood (ONE TOUCH ULTRA TEST) test strip Test blood sugar once daily. Dx code: 250.00 100 each 12  . hydroxychloroquine (PLAQUENIL) 200 MG tablet Take 400 mg by mouth daily.   2  . lamoTRIgine (LAMICTAL) 200 MG tablet Can take one and half of 150mg  for now till finished those pills 90 tablet 0  . lamoTRIgine (LAMICTAL) 25 MG tablet Take 2 tablets (50 mg total) by mouth daily. This is in addtion to 200mg  . Total dose is 225mg  60 tablet 1  . levonorgestrel (MIRENA) 20 MCG/24HR IUD 1 each by Intrauterine route once.    [name] levothyroxine (SYNTHROID, LEVOTHROID) 50 MCG tablet TAKE 1 TABLET(50 MCG) BY MOUTH DAILY BEFORE BREAKFAST 90 tablet 0  . lisinopril (PRINIVIL,ZESTRIL) 5 MG tablet Take 1 tablet (5 mg total) by mouth daily. 90 tablet 0  . lisinopril  (PRINIVIL,ZESTRIL) 5 MG tablet TAKE 1 TABLET BY MOUTH EVERY DAY 90 tablet 0  . metFORMIN (GLUCOPHAGE-XR) 500 MG 24 hr tablet TAKE 1 TABLET BY MOUTH EVERY EVENING 90 tablet 0  . Multiple Vitamin (MULTIVITAMIN WITH MINERALS) TABS Take 1 tablet by mouth daily.    . mupirocin ointment (BACTROBAN) 2 % APPLY TO LESION ON LEFT BREAST TWICE DAILY FOR 14 DAYS  0  . naproxen sodium (ANAPROX) 220 MG tablet Take 220 mg by mouth.    Glory Rosebush DELICA LANCETS 76H MISC Test blood sugar once daily. Dx code: 250.00 100 each 12  . OVER THE COUNTER MEDICATION Vitamin B-12 3000 mcg 1 table daily.    [name] OVER THE COUNTER MEDICATION Amberen 2 tablets every morning    . oxyCODONE 10 MG TABS Take 1 tablet (10 mg total) by mouth every 6 (six) hours as needed (for pain). 60 tablet 0  . Phenylephrine-Acetaminophen (TYLENOL SINUS CONGESTION/PAIN PO) Take 2 tablets by mouth daily as needed (for sinus congestion).     . tretinoin (RETIN-A) 0.025 % gel Apply 3 nights weekly x 2 weeks can increase to nightly only as tolerated    . leflunomide (ARAVA) 20 MG tablet Take by mouth.     No current facility-administered medications for this visit.      Treatment Plan Summary: Medication management  1. Depression::feeling low. Increase lamictal to 50mg  during day and 200mg  at night. Will do gene testing before more change in meds as different have been tried. Continue wellbutrin RA pain effects mood need to continue follow up for that control 2. Anxiety fluctuates. Continue buspar not taking tid but taking bid.  Consider therapy 3. Insomnia: at times. Work on sleep hygiene 4. Back pain:chronic. Working with providers and symptomatic management. FU for RA  Will fu in 4 weeks to re assess and discuss gene testing and med change if needed   Merian Capron, MD  4:04 PM 07/31/2017

## 2017-07-31 NOTE — Patient Instructions (Signed)
Will do gene testing to evaluate and medication change

## 2017-08-01 ENCOUNTER — Other Ambulatory Visit (HOSPITAL_COMMUNITY): Payer: Self-pay | Admitting: Psychiatry

## 2017-08-06 ENCOUNTER — Telehealth (HOSPITAL_COMMUNITY): Payer: Self-pay

## 2017-08-06 NOTE — Telephone Encounter (Signed)
Left VM informing patient that we do not do prior authorizations for [employer] testing. If she has medicare then Dr. De Nurse can sign off stating that it is medically necessary to have the testing done.

## 2017-08-08 DIAGNOSIS — Z79899 Other long term (current) drug therapy: Secondary | ICD-10-CM | POA: Diagnosis not present

## 2017-08-08 DIAGNOSIS — Z5181 Encounter for therapeutic drug level monitoring: Secondary | ICD-10-CM | POA: Diagnosis not present

## 2017-08-08 DIAGNOSIS — M0609 Rheumatoid arthritis without rheumatoid factor, multiple sites: Secondary | ICD-10-CM | POA: Diagnosis not present

## 2017-08-08 DIAGNOSIS — G5603 Carpal tunnel syndrome, bilateral upper limbs: Secondary | ICD-10-CM | POA: Diagnosis not present

## 2017-08-28 ENCOUNTER — Ambulatory Visit (HOSPITAL_COMMUNITY): Payer: PPO | Admitting: Psychiatry

## 2017-09-06 ENCOUNTER — Other Ambulatory Visit (HOSPITAL_COMMUNITY): Payer: Self-pay

## 2017-09-06 MED ORDER — LAMOTRIGINE 200 MG PO TABS: ORAL_TABLET | ORAL | 0 refills | Status: DC

## 2017-09-10 DIAGNOSIS — R748 Abnormal levels of other serum enzymes: Secondary | ICD-10-CM | POA: Diagnosis not present

## 2017-09-17 ENCOUNTER — Telehealth (HOSPITAL_COMMUNITY): Payer: Self-pay

## 2017-09-17 ENCOUNTER — Other Ambulatory Visit (HOSPITAL_COMMUNITY): Payer: Self-pay

## 2017-09-17 DIAGNOSIS — M7918 Myalgia, other site: Secondary | ICD-10-CM | POA: Diagnosis not present

## 2017-09-17 DIAGNOSIS — I10 Essential (primary) hypertension: Secondary | ICD-10-CM | POA: Diagnosis not present

## 2017-09-17 DIAGNOSIS — M0600 Rheumatoid arthritis without rheumatoid factor, unspecified site: Secondary | ICD-10-CM | POA: Diagnosis not present

## 2017-09-17 DIAGNOSIS — M961 Postlaminectomy syndrome, not elsewhere classified: Secondary | ICD-10-CM | POA: Diagnosis not present

## 2017-09-17 MED ORDER — LAMOTRIGINE 25 MG PO TABS: 50.0000 mg | ORAL_TABLET | Freq: Every day | ORAL | 0 refills | Status: DC

## 2017-09-17 NOTE — Telephone Encounter (Signed)
Patient called requesting a 90 day refill on Lamictal 25mg . Sent rx in to the pharmacy.

## 2017-10-08 ENCOUNTER — Telehealth: Payer: Self-pay | Admitting: Family Medicine

## 2017-10-08 NOTE — Telephone Encounter (Signed)
Copied from [place] [id number]. Topic: Inquiry >> Oct 08, 2017 10:03 AM Margot Ables wrote: Reason for CRM: calling regarding Twinrix vaccine that pt received. Needing call back to notify if pt is at high or medium risk for hepatitis. This will determine insurance benefit/coverage of the vaccine.

## 2017-10-08 NOTE — Telephone Encounter (Signed)
We did it because her rheumatologist sent a letter stating she needed it ---  Will it pay for med risk?

## 2017-10-09 NOTE — Telephone Encounter (Signed)
Left detailed message on vm that Lowne thinks medium risk.  She can also call her rheumatologist to ask as well since they told her to get it.

## 2017-10-19 ENCOUNTER — Encounter: Payer: Self-pay | Admitting: Family Medicine

## 2017-10-19 NOTE — Telephone Encounter (Signed)
Yes-- needs flu and prev 23

## 2017-10-19 NOTE — Telephone Encounter (Signed)
Mychart message sent to pt and vaccine order sheet sent to PCP red folder for signature.

## 2017-10-19 NOTE — Telephone Encounter (Signed)
Dr Carollee Herter:  Please review immunizations and confirm that pt may get pneumococcal 23 now as well as influenza vaccine? Last pneumovax 12/20/11 and Prevnar was 09/18/16. Has not had influenza vaccine with [place] this year.

## 2017-10-22 ENCOUNTER — Ambulatory Visit (HOSPITAL_COMMUNITY): Payer: PPO | Admitting: Psychiatry

## 2017-10-23 ENCOUNTER — Ambulatory Visit (INDEPENDENT_AMBULATORY_CARE_PROVIDER_SITE_OTHER): Payer: PPO | Admitting: Psychiatry

## 2017-10-23 ENCOUNTER — Encounter (HOSPITAL_COMMUNITY): Payer: Self-pay | Admitting: Psychiatry

## 2017-10-23 VITALS — BP 118/76 | HR 93 | Ht 63.8 in | Wt 203.0 lb

## 2017-10-23 DIAGNOSIS — G8929 Other chronic pain: Secondary | ICD-10-CM

## 2017-10-23 DIAGNOSIS — G4700 Insomnia, unspecified: Secondary | ICD-10-CM

## 2017-10-23 DIAGNOSIS — F331 Major depressive disorder, recurrent, moderate: Secondary | ICD-10-CM

## 2017-10-23 DIAGNOSIS — F411 Generalized anxiety disorder: Secondary | ICD-10-CM | POA: Diagnosis not present

## 2017-10-23 DIAGNOSIS — M549 Dorsalgia, unspecified: Secondary | ICD-10-CM | POA: Diagnosis not present

## 2017-10-23 MED ORDER — BUSPIRONE HCL 10 MG PO TABS: ORAL_TABLET | ORAL | 0 refills | Status: DC

## 2017-10-23 MED ORDER — LAMOTRIGINE 200 MG PO TABS: ORAL_TABLET | ORAL | 0 refills | Status: DC

## 2017-10-23 MED ORDER — BUPROPION HCL ER (XL) 300 MG PO TB24: ORAL_TABLET | ORAL | 0 refills | Status: DC

## 2017-10-23 MED ORDER — LAMOTRIGINE 25 MG PO TABS: 50.0000 mg | ORAL_TABLET | Freq: Every day | ORAL | 0 refills | Status: DC

## 2017-10-23 NOTE — Progress Notes (Signed)
Patient ID: Michelle Mueller, female   DOB: 08-02-1965, 52 y.o.   MRN: [id number]  Psychiatric Outpatient Follow up Visit  Patient Identification: Michelle Mueller MRN:  [id number] Date of Evaluation:  10/23/2017 Referral Source: Dr. Lennox Mueller Chief Complaint:   Chief Complaint    Follow-up; Depression     Visit Diagnosis: MDD. GAD . Mood related to Chronic back pain Diagnosis:   Patient Active Problem List   Diagnosis Date Noted  . Hyperlipidemia associated with type 2 diabetes mellitus ([place]) [E11.69, E78.5] 07/30/2016  . Rheumatoid arthritis of multiple sites with negative rheumatoid factor ([place]) [M06.09] 03/13/2016  . Chest pain of uncertain etiology [F81.01] 12/21/2014  . Essential hypertension [I10] 12/21/2014  . Myalgia [M79.10] 11/10/2014  . Insomnia [G47.00] 11/10/2014  . UTI (urinary tract infection) [N39.0] 08/04/2013  . Fibromyalgia muscle pain [M79.7] 06/13/2013  . Severe depression ([place]) [F32.2] 06/13/2013  . DM (diabetes mellitus) type II uncontrolled, periph vascular disorder ([place]) [E11.51, E11.65] 06/13/2013  . Rheumatoid arthritis ([place]) [M06.9] 03/19/2013  . Fibromyalgia [M79.7] 03/19/2013  . Unspecified hypothyroidism [E03.9] 03/19/2013  . Goiter [E04.9] 03/19/2013  . Obesity (BMI 30-39.9) [E66.9] 03/19/2013  . 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 Family history of pancreatic cancer [Z80.0] 01/18/2011  . Chronic diarrhea [K52.9] 01/18/2011   History of Present Illness:   Patient returns for follow up visit  suffers from RA . Pain effects mood . C  Has started Humira for the RA or arthritis that has helping some and she feels some relief although from the beginning of the treatment takes 2 months.  The medical was increased last time also her abdominal symptoms less depressed.  no rash Son going to college  Gene testing not done insurance concerns.  Modifying factor: kids Severity of depression: 7/10 Timing: when in pain  associated  symptoms: back pain  Past Medical History:  Past Medical History:  Diagnosis Date  . Anxiety   . Arthritis   . Asthma    last attack 3 yrs ago  . Chicken pox   . Depression   . Diabetes mellitus without complication ([place])   . Fibromyalgia   . Goiter   . Hypertension    dr Michelle Mueller  . Hypothyroidism   . Ovarian cyst   . PONV (postoperative nausea and vomiting)     Past Surgical History:  Procedure Laterality Date  . ABDOMINAL EXPOSURE  12/18/2011   Procedure: ABDOMINAL EXPOSURE;  Surgeon: Michelle Posner, MD;  Location: [facility];  Service: Vascular;  Laterality: N/A;  Anterior Expossure for anterior lumbar interbody fuson  . ANTERIOR LUMBAR FUSION  12/18/2011   Procedure: ANTERIOR LUMBAR FUSION 1 LEVEL;  Surgeon: Michelle Miss, MD;  Location: [facility];  Service: Neurosurgery;  Laterality: N/A;  Lumbar four-five Anterior Lumbar Interbody Fusion with Dr. Donnetta Mueller to do anterior exposure  . CESAREAN SECTION    . CHOLECYSTECTOMY    . HERNIA REPAIR    . LIPOMA EXCISION     L breast  . TONSILLECTOMY    . TUMOR EXCISION     Nerve sheath tumor, R arm   Family History:  Family History  Problem Relation Age of Onset  . Heart disease Father   . Lung cancer Father   . Colon cancer Maternal Aunt   . Pancreatic cancer Maternal Grandmother   . Pancreatic cancer Maternal Grandfather   . Pancreatic cancer Paternal Grandmother   . Cancer Paternal Grandfather   . Sexual abuse Neg Hx  Social History:   Social History   Socioeconomic History  . Marital status: Divorced    Spouse name: Not on file  . Number of children: Not on file  . Years of education: Not on file  . Highest education level: Not on file  Occupational History  . Not on file  Social Needs  . Financial resource strain: Not on file  . Food insecurity:    Worry: Not on file    Inability: Not on file  . Transportation needs:    Medical: Not on file    Non-medical: Not on file  Tobacco Use  . Smoking status:  Never Smoker  . Smokeless tobacco: Never Used  Substance and Sexual Activity  . Alcohol use: No    Alcohol/week: 0.0 standard drinks  . Drug use: No  . Sexual activity: Not Currently    Birth control/protection: IUD, None  Lifestyle  . Physical activity:    Days per week: Not on file    Minutes per session: Not on file  . Stress: Not on file  Relationships  . Social connections:    Talks on phone: Not on file    Gets together: Not on file    Attends religious service: Not on file    Active member of club or organization: Not on file    Attends meetings of clubs or organizations: Not on file    Relationship status: Not on file  Other Topics Concern  . Not on file  Social History Narrative  . Not on file      Psychiatric Specialty Exam:    Depression         Associated symptoms include does not have insomnia and no suicidal ideas.   Review of Systems  Constitutional: Negative for fever.  Cardiovascular: Negative for chest pain and palpitations.  Musculoskeletal: Positive for joint pain.  Skin: Negative for rash.  Neurological: Negative for tremors.  Psychiatric/Behavioral: Negative for substance abuse and suicidal ideas. The patient does not have insomnia.     Blood pressure 118/76, pulse 93, height 5' 3.8" (1.621 m), weight 203 lb (92.1 kg), SpO2 95 %.Body mass index is 35.06 kg/m.  General Appearance: Well Groomed  Eye Contact:  Good  Speech:  Clear and Coherent  Volume:  Normal  Mood: fair  Affect: congruent  Thought Process:  Coherent, Goal Directed, Intact, Linear and Logical  Orientation:  Full (Time, Place, and Person)  Thought Content:  WDL  Suicidal Thoughts:  No  Homicidal Thoughts:  No  Memory:  Immediate;   Good Recent;   Good Remote;   Good  Judgement:  Good  Insight:  Present  Psychomotor Activity:  Decreased  Concentration:  Fair  Recall:  Good  Fund of Knowledge:Good  Language: Good  Akathisia:  No  Handed:  Left  AIMS (if indicated):     Assets:  Communication Skills Desire for Improvement Financial Resources/Insurance Housing Resilience Transportation  ADL's:  Intact  Cognition: WNL  Sleep:  poor    Allergies:   Allergies  Allergen Reactions  . Erythromycin Other (See Comments)    arrhythmia  . Zocor  [Simvastatin-High Dose] Rash  . Doxycycline Other (See Comments)    Decreased BP and caused dizziness per patient  . Peroxide [Hydrogen Peroxide]    Current Medications: Current Outpatient Medications  Medication Sig Dispense Refill  . atorvastatin (LIPITOR) 10 MG tablet TAKE 1 TABLET(10 MG) BY MOUTH DAILY 90 tablet 0  . buPROPion (WELLBUTRIN XL) 300 MG 24  hr tablet TAKE 1 TABLET(300 MG) BY MOUTH DAILY 90 tablet 0  . busPIRone (BUSPAR) 10 MG tablet TAKE 1 TABLET BY MOUTH THREE TIMES DAILY 180 tablet 0  . Calcium-Magnesium-Vitamin D (CALCIUM MAGNESIUM PO) Take 1 tablet by mouth daily.    . cetirizine (ZYRTEC) 5 MG tablet Take 5 mg by mouth daily.    . Cholecalciferol (VITAMIN D) 2000 units CAPS Take 1 capsule by mouth daily.    . cyclobenzaprine (FLEXERIL) 5 MG tablet   1  . estradiol (VIVELLE-DOT) 0.05 MG/24HR patch Place 1 patch (0.05 mg total) onto the skin 2 (two) times a week. May use generic 24 patch 4  . gabapentin (NEURONTIN) 300 MG capsule Take 3 capsules by mouth at bedtime.     [name] glucose blood (ONE TOUCH ULTRA TEST) test strip Test blood sugar once daily. Dx code: 250.00 100 each 12  . hydroxychloroquine (PLAQUENIL) 200 MG tablet Take 400 mg by mouth daily.   2  . lamoTRIgine (LAMICTAL) 200 MG tablet Take one tablet at bedtime. 90 tablet 0  . lamoTRIgine (LAMICTAL) 25 MG tablet Take 2 tablets (50 mg total) by mouth daily. This is in addtion to 200mg  . Total dose is 225mg  180 tablet 0  . levonorgestrel (MIRENA) 20 MCG/24HR IUD 1 each by Intrauterine route once.    [name] levothyroxine (SYNTHROID, LEVOTHROID) 50 MCG tablet TAKE 1 TABLET(50 MCG) BY MOUTH DAILY BEFORE BREAKFAST 90 tablet 0  . lisinopril  (PRINIVIL,ZESTRIL) 5 MG tablet Take 1 tablet (5 mg total) by mouth daily. 90 tablet 0  . lisinopril (PRINIVIL,ZESTRIL) 5 MG tablet TAKE 1 TABLET BY MOUTH EVERY DAY 90 tablet 0  . metFORMIN (GLUCOPHAGE-XR) 500 MG 24 hr tablet TAKE 1 TABLET BY MOUTH EVERY EVENING 90 tablet 0  . Multiple Vitamin (MULTIVITAMIN WITH MINERALS) TABS Take 1 tablet by mouth daily.    . mupirocin ointment (BACTROBAN) 2 % APPLY TO LESION ON LEFT BREAST TWICE DAILY FOR 14 DAYS  0  . naproxen sodium (ANAPROX) 220 MG tablet Take 220 mg by mouth.    Glory Rosebush DELICA LANCETS 84Y MISC Test blood sugar once daily. Dx code: 250.00 100 each 12  . OVER THE COUNTER MEDICATION Vitamin B-12 3000 mcg 1 table daily.    [name] OVER THE COUNTER MEDICATION Amberen 2 tablets every morning    . oxyCODONE 10 MG TABS Take 1 tablet (10 mg total) by mouth every 6 (six) hours as needed (for pain). 60 tablet 0  . Phenylephrine-Acetaminophen (TYLENOL SINUS CONGESTION/PAIN PO) Take 2 tablets by mouth daily as needed (for sinus congestion).     . tretinoin (RETIN-A) 0.025 % gel Apply 3 nights weekly x 2 weeks can increase to nightly only as tolerated    . leflunomide (ARAVA) 20 MG tablet Take by mouth.     No current facility-administered medications for this visit.      Treatment Plan Summary: Medication management  1. Depression:: some better . Says Humira started for pain. Continue wellbutrin and lamictal 2. Anxiety fluctuates. Some better. Continue buspar  Consider therapy 3. Insomnia: not worse. Reviewed sleep hygiene 4. Back pain:chronic. Working with providers and symptomatic management. FU for RA  Now on Humira. .Patient to keep all providers aware of her medication list.   Fu 14m. Renewed meds   Merian Capron, MD  3:45 PM 10/23/2017

## 2017-10-24 ENCOUNTER — Telehealth: Payer: Self-pay

## 2017-10-24 NOTE — Telephone Encounter (Signed)
Copied from [place] [id number]. Topic: Appointment Scheduling - Scheduling Inquiry for Clinic >> Oct 24, 2017  3:19 PM Lennox Solders wrote: Reason for CRM: Pt is calling and would like to know if she can get pna vaccine 23 and take flu shot same time. Pt is on humira

## 2017-10-24 NOTE — Telephone Encounter (Signed)
Please advise 

## 2017-10-25 ENCOUNTER — Other Ambulatory Visit: Payer: Self-pay | Admitting: Family Medicine

## 2017-10-25 DIAGNOSIS — E039 Hypothyroidism, unspecified: Secondary | ICD-10-CM

## 2017-10-25 DIAGNOSIS — D1801 Hemangioma of skin and subcutaneous tissue: Secondary | ICD-10-CM | POA: Diagnosis not present

## 2017-10-25 DIAGNOSIS — D489 Neoplasm of uncertain behavior, unspecified: Secondary | ICD-10-CM | POA: Diagnosis not present

## 2017-10-25 DIAGNOSIS — L821 Other seborrheic keratosis: Secondary | ICD-10-CM | POA: Diagnosis not present

## 2017-10-25 DIAGNOSIS — L709 Acne, unspecified: Secondary | ICD-10-CM | POA: Diagnosis not present

## 2017-10-25 DIAGNOSIS — L814 Other melanin hyperpigmentation: Secondary | ICD-10-CM | POA: Diagnosis not present

## 2017-10-25 DIAGNOSIS — D229 Melanocytic nevi, unspecified: Secondary | ICD-10-CM | POA: Diagnosis not present

## 2017-10-25 NOTE — Telephone Encounter (Signed)
yes

## 2017-10-25 NOTE — Telephone Encounter (Signed)
Spoke w/ Pt- nurse visit scheduled for 10/30/2017 at 3:30PM.

## 2017-10-30 ENCOUNTER — Ambulatory Visit (INDEPENDENT_AMBULATORY_CARE_PROVIDER_SITE_OTHER): Payer: PPO

## 2017-10-30 DIAGNOSIS — Z23 Encounter for immunization: Secondary | ICD-10-CM

## 2017-10-30 NOTE — Progress Notes (Signed)
24

## 2017-11-01 ENCOUNTER — Telehealth: Payer: Self-pay

## 2017-11-01 DIAGNOSIS — N951 Menopausal and female climacteric states: Secondary | ICD-10-CM

## 2017-11-01 MED ORDER — ESTRADIOL 0.0375 MG/24HR TD PTWK
0.0375 mg | MEDICATED_PATCH | TRANSDERMAL | 6 refills | Status: DC
Start: 2017-11-01 — End: 2018-03-25

## 2017-11-01 NOTE — Telephone Encounter (Addendum)
Pt called yesterday wanting Dr.Leggett's advice about starting to ween off Estrogen Patch. I have heard back from Dr.Leggett. I called pt to talk with her about that Michelle Mueller said it is fine to ween off and she needs a new Rx with lower dose and to confirm she still has Mirena IUD but, she did not answer. I left a voicemail letting her know I have sent her a MyChart message.

## 2017-11-01 NOTE — Telephone Encounter (Signed)
Dr.Leggett wanted Estradiol Patch 0.0375 (one patch twice a week) sent to pt's pharmacy. Pharmacy verified. Rx sent. Pt confirmed she still has Mirena IUD.

## 2017-11-01 NOTE — Telephone Encounter (Signed)
Dr.Leggett would like Estradiol patch 0.0375 twice a week sent to pt's pharmacy. Pharmacy verified. Rx sent. Pt confirmed she still has Mirena IUD.

## 2017-11-01 NOTE — Telephone Encounter (Signed)
[place] from [employer] called about pt's Climara prescription. She stated that the Climara would need a PA for twice weekly because it is normally written for once weekly. She wanted to know if we wanted to switch it to Vivelle which did not need a PA for twice weekly. Pt was previously on Vivelle. Prescription fixed.

## 2017-11-14 DIAGNOSIS — Z79899 Other long term (current) drug therapy: Secondary | ICD-10-CM | POA: Diagnosis not present

## 2017-11-14 DIAGNOSIS — M7061 Trochanteric bursitis, right hip: Secondary | ICD-10-CM | POA: Diagnosis not present

## 2017-11-14 DIAGNOSIS — M7062 Trochanteric bursitis, left hip: Secondary | ICD-10-CM | POA: Diagnosis not present

## 2017-11-14 DIAGNOSIS — R945 Abnormal results of liver function studies: Secondary | ICD-10-CM | POA: Diagnosis not present

## 2017-11-14 DIAGNOSIS — M0609 Rheumatoid arthritis without rheumatoid factor, multiple sites: Secondary | ICD-10-CM | POA: Diagnosis not present

## 2017-11-27 DIAGNOSIS — M0609 Rheumatoid arthritis without rheumatoid factor, multiple sites: Secondary | ICD-10-CM | POA: Diagnosis not present

## 2017-11-27 DIAGNOSIS — Z79899 Other long term (current) drug therapy: Secondary | ICD-10-CM | POA: Diagnosis not present

## 2017-12-13 DIAGNOSIS — M7918 Myalgia, other site: Secondary | ICD-10-CM | POA: Diagnosis not present

## 2017-12-13 DIAGNOSIS — M0600 Rheumatoid arthritis without rheumatoid factor, unspecified site: Secondary | ICD-10-CM | POA: Diagnosis not present

## 2017-12-13 DIAGNOSIS — I10 Essential (primary) hypertension: Secondary | ICD-10-CM | POA: Diagnosis not present

## 2017-12-13 DIAGNOSIS — M961 Postlaminectomy syndrome, not elsewhere classified: Secondary | ICD-10-CM | POA: Diagnosis not present

## 2017-12-17 ENCOUNTER — Other Ambulatory Visit: Payer: Self-pay | Admitting: Family Medicine

## 2017-12-17 DIAGNOSIS — I10 Essential (primary) hypertension: Secondary | ICD-10-CM

## 2017-12-20 ENCOUNTER — Other Ambulatory Visit (HOSPITAL_COMMUNITY): Payer: Self-pay | Admitting: Psychiatry

## 2017-12-21 ENCOUNTER — Other Ambulatory Visit (HOSPITAL_COMMUNITY): Payer: Self-pay | Admitting: Psychiatry

## 2017-12-24 ENCOUNTER — Other Ambulatory Visit (HOSPITAL_COMMUNITY): Payer: Self-pay

## 2017-12-24 MED ORDER — LAMOTRIGINE 25 MG PO TABS: 25.0000 mg | ORAL_TABLET | Freq: Two times a day (BID) | ORAL | 0 refills | Status: DC

## 2018-01-03 ENCOUNTER — Other Ambulatory Visit (HOSPITAL_COMMUNITY)
Admission: RE | Admit: 2018-01-03 | Discharge: 2018-01-03 | Disposition: A | Discharge status: Home / Self Care | Payer: PPO | Source: Ambulatory Visit | Attending: Obstetrics & Gynecology | Admitting: Obstetrics & Gynecology

## 2018-01-03 ENCOUNTER — Ambulatory Visit: Payer: PPO | Admitting: *Deleted

## 2018-01-03 DIAGNOSIS — N898 Other specified noninflammatory disorders of vagina: Secondary | ICD-10-CM | POA: Insufficient documentation

## 2018-01-03 NOTE — Progress Notes (Signed)
SUBJECTIVE:  52 y.o. female complains of white vaginal discharge and itching for 2 days. Denies abnormal vaginal bleeding or significant pelvic pain or fever. No UTI symptoms. Denies history of known exposure to STD.   OBJECTIVE:  She appears well, afebrile. Urine dipstick: not done.  ASSESSMENT:  Vaginal Discharge  Vaginal Odor   PLAN:  , BVAG, CVAG probe sent to lab. Treatment: To be determined once lab results are received ROV prn if symptoms persist or worsen.

## 2018-01-04 LAB — CERVICOVAGINAL ANCILLARY ONLY
BACTERIAL VAGINITIS: POSITIVE — AB
CANDIDA VAGINITIS: POSITIVE — AB

## 2018-01-07 ENCOUNTER — Telehealth: Payer: Self-pay | Admitting: *Deleted

## 2018-01-07 MED ORDER — METRONIDAZOLE 500 MG PO TABS: 500.0000 mg | ORAL_TABLET | Freq: Two times a day (BID) | ORAL | 0 refills | Status: DC

## 2018-01-07 MED ORDER — FLUCONAZOLE 150 MG PO TABS: ORAL_TABLET | ORAL | 0 refills | Status: DC

## 2018-01-07 NOTE — Telephone Encounter (Signed)
-----   Message from Emily Filbert, MD sent at 01/07/2018  8:18 AM EST ----- She needs treated for BV and yeast.  Thanks

## 2018-01-10 ENCOUNTER — Ambulatory Visit (INDEPENDENT_AMBULATORY_CARE_PROVIDER_SITE_OTHER): Payer: PPO | Admitting: Family Medicine

## 2018-01-10 ENCOUNTER — Encounter: Payer: Self-pay | Admitting: Family Medicine

## 2018-01-10 VITALS — BP 110/72 | HR 83 | Temp 98.4°F | Resp 16 | Ht 64.0 in | Wt 202.0 lb

## 2018-01-10 DIAGNOSIS — M059 Rheumatoid arthritis with rheumatoid factor, unspecified: Secondary | ICD-10-CM | POA: Diagnosis not present

## 2018-01-10 DIAGNOSIS — E1169 Type 2 diabetes mellitus with other specified complication: Secondary | ICD-10-CM | POA: Diagnosis not present

## 2018-01-10 DIAGNOSIS — R21 Rash and other nonspecific skin eruption: Secondary | ICD-10-CM

## 2018-01-10 DIAGNOSIS — E785 Hyperlipidemia, unspecified: Secondary | ICD-10-CM | POA: Diagnosis not present

## 2018-01-10 DIAGNOSIS — E11620 Type 2 diabetes mellitus with diabetic dermatitis: Secondary | ICD-10-CM

## 2018-01-10 DIAGNOSIS — E039 Hypothyroidism, unspecified: Secondary | ICD-10-CM | POA: Diagnosis not present

## 2018-01-10 DIAGNOSIS — I10 Essential (primary) hypertension: Secondary | ICD-10-CM | POA: Diagnosis not present

## 2018-01-10 MED ORDER — CLOTRIMAZOLE-BETAMETHASONE 1-0.05 % EX CREA: 1.0000 "application " | TOPICAL_CREAM | Freq: Two times a day (BID) | CUTANEOUS | 0 refills | Status: AC

## 2018-01-10 NOTE — Patient Instructions (Signed)
Rash A rash is a change in the color of the skin. A rash can also change the way your skin feels. There are many different conditions and factors that can cause a rash. Follow these instructions at home: Pay attention to any changes in your symptoms. Follow these instructions to help with your condition: Medicine Take or apply over-the-counter and prescription medicines only as told by your health care provider. These may include:  Corticosteroid cream.  Anti-itch lotions.  Oral antihistamines.  Skin Care  Apply cool compresses to the affected areas.  Try taking a bath with: ? Epsom salts. Follow the instructions on the packaging. You can get these at your local pharmacy or grocery store. ? Baking soda. Pour a small amount into the bath as told by your health care provider. ? Colloidal oatmeal. Follow the instructions on the packaging. You can get this at your local pharmacy or grocery store.  Try applying baking soda paste to your skin. Stir water into baking soda until it reaches a paste-like consistency.  Do not scratch or rub your skin.  Avoid covering the rash. Make sure the rash is exposed to air as much as possible. General instructions  Avoid hot showers or baths, which can make itching worse. A cold shower may help.  Avoid scented soaps, detergents, and perfumes. Use gentle soaps, detergents, perfumes, and other cosmetic products.  Avoid any substance that causes your rash. Keep a journal to help track what causes your rash. Write down: ? What you eat. ? What cosmetic products you use. ? What you drink. ? What you wear. This includes jewelry.  Keep all follow-up visits as told by your health care provider. This is important. Contact a health care provider if:  You sweat at night.  You lose weight.  You urinate more than normal.  You feel weak.  You vomit.  Your skin or the whites of your eyes look yellow (jaundice).  Your skin: ? Tingles. ? Is  numb.  Your rash: ? Does not go away after several days. ? Gets worse.  You are: ? Unusually thirsty. ? More tired than normal.  You have: ? New symptoms. ? Pain in your abdomen. ? A fever. ? Diarrhea. Get help right away if:  You develop a rash that covers all or most of your body. The rash may or may not be painful.  You develop blisters that: ? Are on top of the rash. ? Grow larger or grow together. ? Are painful. ? Are inside your nose or mouth.  You develop a rash that: ? Looks like purple pinprick-sized spots all over your body. ? Has a "bull's eye" or looks like a target. ? Is not related to sun exposure, is red and painful, and causes your skin to peel. This information is not intended to replace advice given to you by your health care provider. Make sure you discuss any questions you have with your health care provider. Document Released: 01/06/2002 Document Revised: 06/22/2015 Document Reviewed: 06/03/2014 Elsevier Interactive Patient Education  2018 [employer].

## 2018-01-10 NOTE — Progress Notes (Signed)
Patient ID: [name], female    DOB: [birth year]  Age: 52 y.o. MRN: [id number]    Subjective:  Subjective  HPI Michelle Mueller presents for rash on her neck   It has been there for over a month on and off.  No new soaps , detergents, lotions, makeup etc.  She has used otc with no relief.  She went to [facility] and was given pred which helped but it came right back.  It is almost gone today.  It is very itchy.   Review of Systems  Constitutional: Negative for chills and fever.  HENT: Negative for congestion and hearing loss.   Eyes: Negative for discharge.  Respiratory: Negative for cough and shortness of breath.   Cardiovascular: Negative for chest pain, palpitations and leg swelling.  Gastrointestinal: Negative for abdominal pain, blood in stool, constipation, diarrhea, nausea and vomiting.  Genitourinary: Negative for dysuria, frequency, hematuria and urgency.  Musculoskeletal: Negative for back pain and myalgias.  Skin: Positive for rash.  Allergic/Immunologic: Negative for environmental allergies.  Neurological: Negative for dizziness, weakness and headaches.  Hematological: Does not bruise/bleed easily.  Psychiatric/Behavioral: Negative for suicidal ideas. The patient is not nervous/anxious.     History Past Medical History:  Diagnosis Date  . Anxiety   . Arthritis   . Asthma    last attack 3 yrs ago  . Chicken pox   . Depression   . Diabetes mellitus without complication ([place])   . Fibromyalgia   . Goiter   . Hypertension    [name]  . Hypothyroidism   . Ovarian cyst   . PONV (postoperative nausea and vomiting)     She has a past surgical history that includes Cholecystectomy; Cesarean section; Tonsillectomy; Hernia repair; Lipoma excision; Tumor excision; Anterior lumbar fusion (12/18/2011); and Abdominal exposure (12/18/2011).   Her family history includes Cancer in her paternal grandfather; Colon cancer in her maternal aunt; Heart disease in her father; Lung cancer in  her father; Pancreatic cancer in her maternal grandfather, maternal grandmother, and paternal grandmother.She reports that she has never smoked. She has never used smokeless tobacco. She reports that she does not drink alcohol or use drugs.  Current Outpatient Medications on File Prior to Visit  Medication Sig Dispense Refill  . atorvastatin (LIPITOR) 10 MG tablet TAKE 1 TABLET(10 MG) BY MOUTH DAILY 90 tablet 0  . buPROPion (WELLBUTRIN XL) 300 MG 24 hr tablet TAKE 1 TABLET(300 MG) BY MOUTH DAILY 90 tablet 0  . busPIRone (BUSPAR) 10 MG tablet TAKE 1 TABLET BY MOUTH THREE TIMES DAILY 180 tablet 0  . Calcium-Magnesium-Vitamin D (CALCIUM MAGNESIUM PO) Take 1 tablet by mouth daily.    . cetirizine (ZYRTEC) 5 MG tablet Take 5 mg by mouth daily.    . Cholecalciferol (VITAMIN D) 2000 units CAPS Take 1 capsule by mouth daily.    . cyclobenzaprine (FLEXERIL) 5 MG tablet   1  . estradiol (CLIMARA - DOSED IN MG/24 HR) 0.0375 mg/24hr patch Place 1 patch (0.0375 mg total) onto the skin 2 (two) times a week. 8 patch 6  . etanercept (ENBREL) 50 MG/ML injection Inject into the skin.    . fluconazole (DIFLUCAN) 150 MG tablet Take 1 PO now and may repeat in 3 days if needed. 2 tablet 0  . gabapentin (NEURONTIN) 300 MG capsule Take 3 capsules by mouth at bedtime.     [name] glucose blood (ONE TOUCH ULTRA TEST) test strip Test blood sugar once daily. Dx code: 250.00 100  each 12  . lamoTRIgine (LAMICTAL) 200 MG tablet Take one tablet at bedtime. 90 tablet 0  . lamoTRIgine (LAMICTAL) 25 MG tablet Take 1 tablet (25 mg total) by mouth 2 (two) times daily. This is in addtion to 200mg  . Total dose is 250mg  180 tablet 0  . levonorgestrel (MIRENA) 20 MCG/24HR IUD 1 each by Intrauterine route once.    [name] levothyroxine (SYNTHROID, LEVOTHROID) 50 MCG tablet Take 1 tablet (50 mcg total) by mouth daily before breakfast. 90 tablet 1  . lisinopril (PRINIVIL,ZESTRIL) 5 MG tablet Take 1 tablet (5 mg total) by mouth daily. 90 tablet 0  .  lisinopril (PRINIVIL,ZESTRIL) 5 MG tablet TAKE 1 TABLET BY MOUTH EVERY DAY 90 tablet 0  . metFORMIN (GLUCOPHAGE-XR) 500 MG 24 hr tablet TAKE 1 TABLET BY MOUTH EVERY EVENING 90 tablet 0  . metroNIDAZOLE (FLAGYL) 500 MG tablet Take 1 tablet (500 mg total) by mouth 2 (two) times daily. 14 tablet 0  . Multiple Vitamin (MULTIVITAMIN WITH MINERALS) TABS Take 1 tablet by mouth daily.    . mupirocin ointment (BACTROBAN) 2 % APPLY TO LESION ON LEFT BREAST TWICE DAILY FOR 14 DAYS  0  . naproxen sodium (ANAPROX) 220 MG tablet Take 220 mg by mouth.    Glory Rosebush DELICA LANCETS 51Z MISC Test blood sugar once daily. Dx code: 250.00 100 each 12  . OVER THE COUNTER MEDICATION Vitamin B-12 3000 mcg 1 table daily.    [name] OVER THE COUNTER MEDICATION Amberen 2 tablets every morning    . oxyCODONE 10 MG TABS Take 1 tablet (10 mg total) by mouth every 6 (six) hours as needed (for pain). 60 tablet 0  . Phenylephrine-Acetaminophen (TYLENOL SINUS CONGESTION/PAIN PO) Take 2 tablets by mouth daily as needed (for sinus congestion).     . tretinoin (RETIN-A) 0.025 % gel Apply 3 nights weekly x 2 weeks can increase to nightly only as tolerated    . [DISCONTINUED] escitalopram (LEXAPRO) 10 MG tablet TAKE 1 AND 1/2 TABLETS(15 MG) BY MOUTH DAILY 45 tablet 0  . [DISCONTINUED] FLUoxetine (PROZAC) 20 MG tablet Take 1 tablet (20 mg total) by mouth daily. 90 tablet 0   No current facility-administered medications on file prior to visit.      Objective:  Objective  Physical Exam Vitals signs and nursing note reviewed.  Constitutional:      Appearance: She is well-developed.  HENT:     Head: Normocephalic and atraumatic.  Eyes:     Conjunctiva/sclera: Conjunctivae normal.  Neck:     Musculoskeletal: Normal range of motion and neck supple.     Thyroid: No thyromegaly.     Vascular: No carotid bruit or JVD.  Cardiovascular:     Rate and Rhythm: Normal rate and regular rhythm.     Heart sounds: Normal heart sounds. No murmur.    Pulmonary:     Effort: Pulmonary effort is normal. No respiratory distress.     Breath sounds: Normal breath sounds. No wheezing or rales.  Chest:     Chest wall: No tenderness.  Skin:    Findings: Erythema and rash present. Rash is papular.       Neurological:     Mental Status: She is alert and oriented to person, place, and time.    BP 110/72 (BP Location: Right Arm, Cuff Size: Large)   Pulse 83   Temp 98.4 F (36.9 C) (Oral)   Resp 16   Ht 5\' 4"  (1.626 m)   Wt 202 lb (  91.6 kg)   SpO2 98%   BMI 34.67 kg/m  Wt Readings from Last 3 Encounters:  01/10/18 202 lb (91.6 kg)  07/19/17 199 lb (90.3 kg)  07/02/17 195 lb (88.5 kg)     Lab Results  Component Value Date   WBC 8.3 07/19/2017   HGB 13.0 07/19/2017   HCT 39.3 07/19/2017   PLT 237.0 07/19/2017   GLUCOSE 87 01/10/2018   CHOL 158 01/10/2018   TRIG 58.0 01/10/2018   HDL 82.90 01/10/2018   LDLCALC 63 01/10/2018   ALT 23 01/10/2018   AST 19 01/10/2018   NA 140 01/10/2018   K 4.2 01/10/2018   CL 103 01/10/2018   CREATININE 1.02 01/10/2018   BUN 17 01/10/2018   CO2 28 01/10/2018   TSH 2.16 01/10/2018   HGBA1C 5.3 01/10/2018   MICROALBUR 0.5 08/28/2013    No results found.   Assessment & Plan:  Plan  I have discontinued Michelle Mueller's hydroxychloroquine and leflunomide. I am also having her start on clotrimazole-betamethasone. Additionally, I am having her maintain her Phenylephrine-Acetaminophen (TYLENOL SINUS CONGESTION/PAIN PO), multivitamin with minerals, Oxycodone HCl, gabapentin, glucose blood, ONETOUCH DELICA LANCETS 45W, OVER THE COUNTER MEDICATION, cetirizine, cyclobenzaprine, levonorgestrel, Calcium-Magnesium-Vitamin D (CALCIUM MAGNESIUM PO), OVER THE COUNTER MEDICATION, lisinopril, naproxen sodium, Vitamin D, mupirocin ointment, tretinoin, metFORMIN, atorvastatin, buPROPion, busPIRone, lamoTRIgine, levothyroxine, estradiol, lisinopril, lamoTRIgine, fluconazole, metroNIDAZOLE, and  etanercept.  Meds ordered this encounter  Medications  . clotrimazole-betamethasone (LOTRISONE) cream    Sig: Apply 1 application topically 2 (two) times daily.    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 Encouraged heart healthy diet such as the DASH diet and exercise as tolerated.       Hyperlipidemia associated with type 2 diabetes mellitus ([place])    Tolerating statin, encouraged heart healthy diet, avoid trans fats, minimize simple carbs and saturated fats. Increase exercise as tolerated      Relevant Orders   Lipid panel (Completed)   Comprehensive metabolic panel (Completed)   Hypothyroidism    .check labs con't meds stable      Relevant Orders   TSH (Completed)   Rash - Primary   Relevant Medications   clotrimazole-betamethasone (LOTRISONE) cream   Other Relevant Orders   Ambulatory referral to Dermatology   Rheumatoid arthritis ([place])    Per rheum      Relevant Medications   etanercept (ENBREL) 50 MG/ML injection      Follow-up: Return in about 6 months (around 07/12/2018), or if symptoms worsen or fail to improve.  Ann Held, DO

## 2018-01-11 LAB — LIPID PANEL
Cholesterol: 158 mg/dL (ref 0–200)
HDL: 82.9 mg/dL (ref >39.00)
LDL CALC: 63 mg/dL (ref 0–99)
NonHDL: 74.6
Total CHOL/HDL Ratio: 2
Triglycerides: 58 mg/dL (ref 0.0–149.0)
VLDL: 11.6 mg/dL (ref 0.0–40.0)

## 2018-01-11 LAB — COMPREHENSIVE METABOLIC PANEL
ALBUMIN: 4.4 g/dL (ref 3.5–5.2)
ALT: 23 U/L (ref 0–35)
AST: 19 U/L (ref 0–37)
Alkaline Phosphatase: 61 U/L (ref 39–117)
BILIRUBIN TOTAL: 0.4 mg/dL (ref 0.2–1.2)
BUN: 17 mg/dL (ref 6–23)
CALCIUM: 9.6 mg/dL (ref 8.4–10.5)
CHLORIDE: 103 meq/L (ref 96–112)
CO2: 28 meq/L (ref 19–32)
CREATININE: 1.02 mg/dL (ref 0.40–1.20)
GFR: 60.77 mL/min (ref >60.00)
Glucose, Bld: 87 mg/dL (ref 70–99)
Potassium: 4.2 mEq/L (ref 3.5–5.1)
SODIUM: 140 meq/L (ref 135–145)
Total Protein: 6.7 g/dL (ref 6.0–8.3)

## 2018-01-11 LAB — HEMOGLOBIN A1C: Hgb A1c MFr Bld: 5.3 % (ref 4.6–6.5)

## 2018-01-11 LAB — TSH: TSH: 2.16 u[IU]/mL (ref 0.35–4.50)

## 2018-01-13 ENCOUNTER — Other Ambulatory Visit: Payer: Self-pay | Admitting: Family Medicine

## 2018-01-13 DIAGNOSIS — R21 Rash and other nonspecific skin eruption: Secondary | ICD-10-CM | POA: Insufficient documentation

## 2018-01-13 DIAGNOSIS — E1165 Type 2 diabetes mellitus with hyperglycemia: Principal | ICD-10-CM

## 2018-01-13 DIAGNOSIS — E119 Type 2 diabetes mellitus without complications: Secondary | ICD-10-CM

## 2018-01-13 DIAGNOSIS — IMO0001 Reserved for inherently not codable concepts without codable children: Secondary | ICD-10-CM

## 2018-01-13 DIAGNOSIS — E039 Hypothyroidism, unspecified: Secondary | ICD-10-CM | POA: Insufficient documentation

## 2018-01-13 NOTE — Assessment & Plan Note (Signed)
Check labs con't meds 

## 2018-01-13 NOTE — Assessment & Plan Note (Signed)
Per rheum 

## 2018-01-13 NOTE — Assessment & Plan Note (Signed)
Well controlled, no changes to meds. Encouraged heart healthy diet such as the DASH diet and exercise as tolerated.  °

## 2018-01-13 NOTE — Assessment & Plan Note (Signed)
Tolerating statin, encouraged heart healthy diet, avoid trans fats, minimize simple carbs and saturated fats. Increase exercise as tolerated 

## 2018-01-13 NOTE — Assessment & Plan Note (Signed)
.  check labs con't meds stable

## 2018-01-15 ENCOUNTER — Encounter: Payer: Self-pay | Admitting: Family Medicine

## 2018-01-15 ENCOUNTER — Telehealth: Payer: Self-pay | Admitting: Family Medicine

## 2018-01-15 NOTE — Telephone Encounter (Signed)
Copied from [place] [id number]. Topic: Quick Communication - See Telephone Encounter >> Jan 15, 2018 12:02 PM Bea Graff, NT wrote: CRM for notification. See Telephone encounter for: 01/15/18. Pt requesting a call with lab results.

## 2018-01-16 ENCOUNTER — Other Ambulatory Visit: Payer: Self-pay | Admitting: *Deleted

## 2018-01-16 MED ORDER — METFORMIN HCL ER 500 MG PO TB24: 500.0000 mg | ORAL_TABLET | Freq: Every evening | ORAL | 1 refills | Status: DC

## 2018-01-16 MED ORDER — FLUCONAZOLE 150 MG PO TABS: 150.0000 mg | ORAL_TABLET | Freq: Once | ORAL | 0 refills | Status: AC

## 2018-01-16 NOTE — Progress Notes (Signed)
Pt called stating that she must have thrown away her 2nd Diflucan because she cannot find it now.  Requested to have 1 called into [facility].

## 2018-01-17 ENCOUNTER — Encounter (HOSPITAL_COMMUNITY): Payer: Self-pay | Admitting: Psychiatry

## 2018-01-17 ENCOUNTER — Ambulatory Visit (INDEPENDENT_AMBULATORY_CARE_PROVIDER_SITE_OTHER): Payer: PPO | Admitting: Psychiatry

## 2018-01-17 ENCOUNTER — Other Ambulatory Visit: Payer: Self-pay

## 2018-01-17 VITALS — BP 122/82 | HR 88 | Ht 64.0 in | Wt 200.0 lb

## 2018-01-17 DIAGNOSIS — F411 Generalized anxiety disorder: Secondary | ICD-10-CM | POA: Diagnosis not present

## 2018-01-17 DIAGNOSIS — F331 Major depressive disorder, recurrent, moderate: Secondary | ICD-10-CM | POA: Diagnosis not present

## 2018-01-17 DIAGNOSIS — M549 Dorsalgia, unspecified: Secondary | ICD-10-CM

## 2018-01-17 DIAGNOSIS — G8929 Other chronic pain: Secondary | ICD-10-CM | POA: Diagnosis not present

## 2018-01-17 DIAGNOSIS — M5489 Other dorsalgia: Secondary | ICD-10-CM

## 2018-01-17 NOTE — Telephone Encounter (Signed)
Lab result note indicates pt viewed lab results on 01/15/18 at 11:44pm. Callback not needed at this time.

## 2018-01-17 NOTE — Progress Notes (Signed)
Patient ID: Michelle Mueller, female   DOB: 1965-07-12, 52 y.o.   MRN: [id number]  Psychiatric Outpatient Follow up Visit  Patient Identification: SHAHED YEOMAN MRN:  [id number] Date of Evaluation:  01/17/2018 Referral Source: [name] Chief Complaint:   Chief Complaint    Follow-up; Other     Visit Diagnosis: MDD. GAD . Mood related to Chronic back pain Diagnosis:   Patient Active Problem List   Diagnosis Date Noted  . Rash [R21] 01/13/2018  . Hypothyroidism [E03.9] 01/13/2018  . Hyperlipidemia associated with type 2 diabetes mellitus ([place]) [E11.69, E78.5] 07/30/2016  . Rheumatoid arthritis of multiple sites with negative rheumatoid factor ([place]) [M06.09] 03/13/2016  . Chest pain of uncertain etiology [T51.76] 12/21/2014  . Essential hypertension [I10] 12/21/2014  . Myalgia [M79.10] 11/10/2014  . Insomnia [G47.00] 11/10/2014  . UTI (urinary tract infection) [N39.0] 08/04/2013  . Fibromyalgia muscle pain [M79.7] 06/13/2013  . Severe depression ([place]) [F32.2] 06/13/2013  . 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 Rheumatoid arthritis ([place]) [M06.9] 03/19/2013  . Fibromyalgia [M79.7] 03/19/2013  . Unspecified hypothyroidism [E03.9] 03/19/2013  . Goiter [E04.9] 03/19/2013  . Obesity (BMI 30-39.9) [E66.9] 03/19/2013  . 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 Family history of pancreatic cancer [Z80.0] 01/18/2011  . Chronic diarrhea [K52.9] 01/18/2011   History of Present Illness:   Patient returns for follow up visit  suffers from RA . Pain effects mood . C  Had allergy to medicine now on Enbrel or will be considered Feeling more upbeat although pain does affect mood but tolerating medication very Lamictal reasonable with no rash BuSpar helps anxiety looking forward for the holidays Son goes to college Modifying factor: kids Severity of depression: fair and  better then last time Timing: when in pain  associated symptoms: back pain  Past Medical History:  Past Medical History:  Diagnosis Date  . Anxiety   . Arthritis   . Asthma    last attack 3 yrs ago  . Chicken pox   . Depression   . Diabetes mellitus without complication ([place])   . Fibromyalgia   . Goiter   . Hypertension    [name]  . Hypothyroidism   . Ovarian cyst   . PONV (postoperative nausea and vomiting)     Past Surgical History:  Procedure Laterality Date  . ABDOMINAL EXPOSURE  12/18/2011   Procedure: ABDOMINAL EXPOSURE;  Surgeon: [name], MD;  Location: [facility];  Service: Vascular;  Laterality: N/A;  Anterior Expossure for anterior lumbar interbody fuson  . ANTERIOR LUMBAR FUSION  12/18/2011   Procedure: ANTERIOR LUMBAR FUSION 1 LEVEL;  Surgeon: [name], MD;  Location: [facility];  Service: Neurosurgery;  Laterality: N/A;  Lumbar four-five Anterior Lumbar Interbody Fusion with Dr. [name] to do anterior exposure  . CESAREAN SECTION    . CHOLECYSTECTOMY    . HERNIA REPAIR    . LIPOMA EXCISION     L breast  . TONSILLECTOMY    . TUMOR EXCISION     Nerve sheath tumor, R arm   Family History:  Family History  Problem Relation Age of Onset  . Heart disease Father   . Lung cancer Father   . Colon cancer Maternal Aunt   . Pancreatic cancer Maternal Grandmother   . Pancreatic cancer Maternal Grandfather   . Pancreatic cancer Paternal Grandmother   . Cancer Paternal Grandfather   .  Sexual abuse Neg Hx    Social History:   Social History   Socioeconomic History  . Marital status: Divorced    Spouse name: Not on file  . Number of children: Not on file  . Years of education: Not on file  . Highest education level: Not on file  Occupational History  . Not on file  Social Needs  . Financial resource strain: Not on file  . Food insecurity:    Worry: Not on file    Inability: Not on file  . Transportation needs:    Medical: Not on file     Non-medical: Not on file  Tobacco Use  . Smoking status: Never Smoker  . Smokeless tobacco: Never Used  Substance and Sexual Activity  . Alcohol use: No    Alcohol/week: 0.0 standard drinks  . Drug use: No  . Sexual activity: Not Currently    Birth control/protection: I.U.D., None  Lifestyle  . Physical activity:    Days per week: Not on file    Minutes per session: Not on file  . Stress: Not on file  Relationships  . Social connections:    Talks on phone: Not on file    Gets together: Not on file    Attends religious service: Not on file    Active member of club or organization: Not on file    Attends meetings of clubs or organizations: Not on file    Relationship status: Not on file  Other Topics Concern  . Not on file  Social History Narrative  . Not on file      Psychiatric Specialty Exam:    Depression         Associated symptoms include does not have insomnia and no suicidal ideas.   Review of Systems  Constitutional: Negative for fever.  Cardiovascular: Negative for chest pain and palpitations.  Musculoskeletal: Positive for joint pain.  Skin: Negative for rash.  Neurological: Negative for tremors.  Psychiatric/Behavioral: Positive for depression. Negative for substance abuse and suicidal ideas. The patient does not have insomnia.     Blood pressure 122/82, pulse 88, height 5\' 4"  (1.626 m), weight 200 lb (90.7 kg).Body mass index is 34.33 kg/m.  General Appearance: Well Groomed  Eye Contact:  Good  Speech:  Clear and Coherent  Volume:  Normal  Mood: better  Affect: congruent  Thought Process:  Coherent, Goal Directed, Intact, Linear and Logical  Orientation:  Full (Time, Place, and Person)  Thought Content:  WDL  Suicidal Thoughts:  No  Homicidal Thoughts:  No  Memory:  Immediate;   Good Recent;   Good Remote;   Good  Judgement:  Good  Insight:  Present  Psychomotor Activity:  Decreased  Concentration:  Fair  Recall:  Good  Fund of  Knowledge:Good  Language: Good  Akathisia:  No  Handed:  Left  AIMS (if indicated):    Assets:  Communication Skills Desire for Improvement Financial Resources/Insurance Housing Resilience Transportation  ADL's:  Intact  Cognition: WNL  Sleep:  poor    Allergies:   Allergies  Allergen Reactions  . Erythromycin Other (See Comments)    arrhythmia  . Zocor  [Simvastatin-High Dose] Rash  . Doxycycline Other (See Comments)    Decreased BP and caused dizziness per patient  . Peroxide [Hydrogen Peroxide]    Current Medications: Current Outpatient Medications  Medication Sig Dispense Refill  . atorvastatin (LIPITOR) 10 MG tablet TAKE 1 TABLET(10 MG) BY MOUTH DAILY 90 tablet 0  .  buPROPion (WELLBUTRIN XL) 300 MG 24 hr tablet TAKE 1 TABLET(300 MG) BY MOUTH DAILY 90 tablet 0  . busPIRone (BUSPAR) 10 MG tablet TAKE 1 TABLET BY MOUTH THREE TIMES DAILY 180 tablet 0  . Calcium-Magnesium-Vitamin D (CALCIUM MAGNESIUM PO) Take 1 tablet by mouth daily.    . cetirizine (ZYRTEC) 5 MG tablet Take 5 mg by mouth daily.    . Cholecalciferol (VITAMIN D) 2000 units CAPS Take 1 capsule by mouth daily.    . clotrimazole-betamethasone (LOTRISONE) cream Apply 1 application topically 2 (two) times daily. 30 g 0  . cyclobenzaprine (FLEXERIL) 5 MG tablet   1  . estradiol (CLIMARA - DOSED IN MG/24 HR) 0.0375 mg/24hr patch Place 1 patch (0.0375 mg total) onto the skin 2 (two) times a week. 8 patch 6  . etanercept (ENBREL) 50 MG/ML injection Inject into the skin.    [name] gabapentin (NEURONTIN) 300 MG capsule Take 3 capsules by mouth at bedtime.     [name] glucose blood (ONE TOUCH ULTRA TEST) test strip Test blood sugar once daily. Dx code: 250.00 100 each 12  . lamoTRIgine (LAMICTAL) 200 MG tablet Take one tablet at bedtime. 90 tablet 0  . lamoTRIgine (LAMICTAL) 25 MG tablet Take 1 tablet (25 mg total) by mouth 2 (two) times daily. This is in addtion to 200mg  . Total dose is 250mg  180 tablet 0  . levonorgestrel  (MIRENA) 20 MCG/24HR IUD 1 each by Intrauterine route once.    [name] levothyroxine (SYNTHROID, LEVOTHROID) 50 MCG tablet Take 1 tablet (50 mcg total) by mouth daily before breakfast. 90 tablet 1  . lisinopril (PRINIVIL,ZESTRIL) 5 MG tablet Take 1 tablet (5 mg total) by mouth daily. 90 tablet 0  . lisinopril (PRINIVIL,ZESTRIL) 5 MG tablet TAKE 1 TABLET BY MOUTH EVERY DAY 90 tablet 0  . metFORMIN (GLUCOPHAGE-XR) 500 MG 24 hr tablet Take 1 tablet (500 mg total) by mouth every evening. 90 tablet 1  . metroNIDAZOLE (FLAGYL) 500 MG tablet Take 1 tablet (500 mg total) by mouth 2 (two) times daily. 14 tablet 0  . Multiple Vitamin (MULTIVITAMIN WITH MINERALS) TABS Take 1 tablet by mouth daily.    . mupirocin ointment (BACTROBAN) 2 % APPLY TO LESION ON LEFT BREAST TWICE DAILY FOR 14 DAYS  0  . naproxen sodium (ANAPROX) 220 MG tablet Take 220 mg by mouth.    Glory Rosebush DELICA LANCETS 47W MISC Test blood sugar once daily. Dx code: 250.00 100 each 12  . OVER THE COUNTER MEDICATION Vitamin B-12 3000 mcg 1 table daily.    [name] OVER THE COUNTER MEDICATION Amberen 2 tablets every morning    . oxyCODONE 10 MG TABS Take 1 tablet (10 mg total) by mouth every 6 (six) hours as needed (for pain). 60 tablet 0  . Phenylephrine-Acetaminophen (TYLENOL SINUS CONGESTION/PAIN PO) Take 2 tablets by mouth daily as needed (for sinus congestion).     . tretinoin (RETIN-A) 0.025 % gel Apply 3 nights weekly x 2 weeks can increase to nightly only as tolerated     No current facility-administered medications for this visit.      Treatment Plan Summary: Medication management  1. Depression:: better. Continue meds including lamictal and wellbutrin 2. Anxiety fluctuates. Continue buspar  3. Insomnia: not worse. Reviewed sleep hygiene 4. Back pain:chronic. Working with providers and symptomatic management. FU for RA  Had taken Humaira now possible Embral   Fu 8m. Says have meds, call for refills   Merian Capron, MD  4:08  PM  01/17/2018  

## 2018-01-18 ENCOUNTER — Ambulatory Visit: Payer: PPO | Admitting: Family Medicine

## 2018-01-28 ENCOUNTER — Encounter: Payer: Self-pay | Admitting: Family Medicine

## 2018-01-28 ENCOUNTER — Encounter: Payer: Self-pay | Admitting: Internal Medicine

## 2018-01-28 ENCOUNTER — Ambulatory Visit (INDEPENDENT_AMBULATORY_CARE_PROVIDER_SITE_OTHER): Payer: PPO | Admitting: Internal Medicine

## 2018-01-28 VITALS — BP 130/86 | HR 90 | Temp 98.0°F | Wt 201.4 lb

## 2018-01-28 DIAGNOSIS — Z79899 Other long term (current) drug therapy: Secondary | ICD-10-CM | POA: Diagnosis not present

## 2018-01-28 DIAGNOSIS — J069 Acute upper respiratory infection, unspecified: Secondary | ICD-10-CM | POA: Diagnosis not present

## 2018-01-28 DIAGNOSIS — R05 Cough: Secondary | ICD-10-CM

## 2018-01-28 DIAGNOSIS — Z20818 Contact with and (suspected) exposure to other bacterial communicable diseases: Secondary | ICD-10-CM

## 2018-01-28 DIAGNOSIS — R058 Other specified cough: Secondary | ICD-10-CM

## 2018-01-28 LAB — POC INFLUENZA A&B (BINAX/QUICKVUE)
Influenza A, POC: NEGATIVE
Influenza B, POC: NEGATIVE

## 2018-01-28 LAB — POCT RAPID STREP A (OFFICE): Rapid Strep A Screen: NEGATIVE

## 2018-01-28 MED ORDER — AMOXICILLIN-POT CLAVULANATE 875-125 MG PO TABS: 1.0000 | ORAL_TABLET | Freq: Two times a day (BID) | ORAL | 0 refills | Status: DC

## 2018-01-28 NOTE — Progress Notes (Signed)
Chief Complaint  Patient presents with  . Cough    x3 days. Been waking up soaked in sweat. Chest congestion. ears and throat hurt and has headaches. productive cough that is yellowish     HPI: [name] 52 y.o. come in for sda    pcp appt NA  Dr Etter Sjogren  See above . Son came home from college with dx of  strep and   Not syur finished med   onset of sx sore throat  Dec 26 and felt a bit better  But  hard to clear throat  [facility] to go to [facility]  And 4 hour  ait   to waited it out.    Drainage when lays down .  Feels choking t that time   Headache.   Had   Flu vaccine  This season.   On  embrel for   RA  Per [facility]. .  Just got inj last week  ROS: See pertinent positives and negatives per HPI. No cp sob  Had sweats and ear pain last night and feels had white spots  In throat ( hx of   Mono  In past)   Past Medical History:  Diagnosis Date  . Anxiety   . Arthritis   . Asthma    last attack 3 yrs ago  . Chicken pox   . Depression   . Diabetes mellitus without complication ([place])   . Fibromyalgia   . Goiter   . Hypertension    [name]  . Hypothyroidism   . Ovarian cyst   . PONV (postoperative nausea and vomiting)     Family History  Problem Relation Age of Onset  . Heart disease Father   . Lung cancer Father   . Colon cancer Maternal Aunt   . Pancreatic cancer Maternal Grandmother   . Pancreatic cancer Maternal Grandfather   . Pancreatic cancer Paternal Grandmother   . Cancer Paternal Grandfather   . Sexual abuse Neg Hx     Social History   Socioeconomic History  . Marital status: Divorced    Spouse name: Not on file  . Number of children: Not on file  . Years of education: Not on file  . Highest education level: Not on file  Occupational History  . Not on file  Social Needs  . Financial resource strain: Not on file  . Food insecurity:    Worry: Not on file    Inability: Not on file  . Transportation needs:    Medical: Not on file    Non-medical: Not  on file  Tobacco Use  . Smoking status: Never Smoker  . Smokeless tobacco: Never Used  Substance and Sexual Activity  . Alcohol use: No    Alcohol/week: 0.0 standard drinks  . Drug use: No  . Sexual activity: Not Currently    Birth control/protection: I.U.D., None  Lifestyle  . Physical activity:    Days per week: Not on file    Minutes per session: Not on file  . Stress: Not on file  Relationships  . Social connections:    Talks on phone: Not on file    Gets together: Not on file    Attends religious service: Not on file    Active member of club or organization: Not on file    Attends meetings of clubs or organizations: Not on file    Relationship status: Not on file  Other Topics Concern  . Not on file  Social History Narrative  . Not on file    Outpatient Medications Prior to Visit  Medication Sig Dispense Refill  . atorvastatin (LIPITOR) 10 MG tablet TAKE 1 TABLET(10 MG) BY MOUTH DAILY 90 tablet 0  . buPROPion (WELLBUTRIN XL) 300 MG 24 hr tablet TAKE 1 TABLET(300 MG) BY MOUTH DAILY 90 tablet 0  . busPIRone (BUSPAR) 10 MG tablet TAKE 1 TABLET BY MOUTH THREE TIMES DAILY 180 tablet 0  . Calcium-Magnesium-Vitamin D (CALCIUM MAGNESIUM PO) Take 1 tablet by mouth daily.    . cetirizine (ZYRTEC) 5 MG tablet Take 5 mg by mouth daily.    . Cholecalciferol (VITAMIN D) 2000 units CAPS Take 1 capsule by mouth daily.    . clotrimazole-betamethasone (LOTRISONE) cream Apply 1 application topically 2 (two) times daily. 30 g 0  . cyclobenzaprine (FLEXERIL) 5 MG tablet   1  . estradiol (CLIMARA - DOSED IN MG/24 HR) 0.0375 mg/24hr patch Place 1 patch (0.0375 mg total) onto the skin 2 (two) times a week. 8 patch 6  . etanercept (ENBREL) 50 MG/ML injection Inject into the skin.    [name] gabapentin (NEURONTIN) 300 MG capsule Take 3 capsules by mouth at bedtime.     [name] glucose blood (ONE TOUCH ULTRA TEST) test strip Test blood sugar once daily. Dx code: 250.00 100 each 12  . lamoTRIgine (LAMICTAL)  200 MG tablet Take one tablet at bedtime. 90 tablet 0  . lamoTRIgine (LAMICTAL) 25 MG tablet Take 1 tablet (25 mg total) by mouth 2 (two) times daily. This is in addtion to 200mg  . Total dose is 250mg  180 tablet 0  . levonorgestrel (MIRENA) 20 MCG/24HR IUD 1 each by Intrauterine route once.    [name] levothyroxine (SYNTHROID, LEVOTHROID) 50 MCG tablet Take 1 tablet (50 mcg total) by mouth daily before breakfast. 90 tablet 1  . lisinopril (PRINIVIL,ZESTRIL) 5 MG tablet Take 1 tablet (5 mg total) by mouth daily. 90 tablet 0  . lisinopril (PRINIVIL,ZESTRIL) 5 MG tablet TAKE 1 TABLET BY MOUTH EVERY DAY 90 tablet 0  . metFORMIN (GLUCOPHAGE-XR) 500 MG 24 hr tablet Take 1 tablet (500 mg total) by mouth every evening. 90 tablet 1  . metroNIDAZOLE (FLAGYL) 500 MG tablet Take 1 tablet (500 mg total) by mouth 2 (two) times daily. 14 tablet 0  . Multiple Vitamin (MULTIVITAMIN WITH MINERALS) TABS Take 1 tablet by mouth daily.    . mupirocin ointment (BACTROBAN) 2 % APPLY TO LESION ON LEFT BREAST TWICE DAILY FOR 14 DAYS  0  . naproxen sodium (ANAPROX) 220 MG tablet Take 220 mg by mouth.    Glory Rosebush DELICA LANCETS 06Y MISC Test blood sugar once daily. Dx code: 250.00 100 each 12  . OVER THE COUNTER MEDICATION Vitamin B-12 3000 mcg 1 table daily.    [name] OVER THE COUNTER MEDICATION Amberen 2 tablets every morning    . oxyCODONE 10 MG TABS Take 1 tablet (10 mg total) by mouth every 6 (six) hours as needed (for pain). 60 tablet 0  . Phenylephrine-Acetaminophen (TYLENOL SINUS CONGESTION/PAIN PO) Take 2 tablets by mouth daily as needed (for sinus congestion).     . tretinoin (RETIN-A) 0.025 % gel Apply 3 nights weekly x 2 weeks can increase to nightly only as tolerated     No facility-administered medications prior to visit.      EXAM:  BP 130/86 (BP Location: Right Arm, Patient Position: Sitting, Cuff Size: Large)   Pulse 90   Temp 98 F (36.7 C) (Oral)  Wt 201 lb 6.4 oz (91.4 kg)   SpO2 97%   BMI 34.57 kg/m     Body mass index is 34.57 kg/m.  GENERAL: vitals reviewed and listed above, alert, oriented, appears well hydrated and in no acute distress very congested and hoarse but no stridor   Nl speech  HEENT: atraumatic, conjunctiva  clear, no obvious abnormalities on inspection of external nose and ears tms clear .[name] Face non tender    OP : no lesion edema or exudate   Red  No palatal petechia   Don t see exudate  NECK: no obvious masses on inspection 1+?palpation  Tender ac nodes    LUNGS: clear to auscultation bilaterally, no wheezes, rales or rhonchi, good air movement CV: HRRR, no clubbing cyanosis or  peripheral edema nl cap refill  MS: moves all extremities without noticeable focal  abnormality PSYCH: pleasant and cooperative, no obvious depression or anxiety Lab Results  Component Value Date   WBC 8.3 07/19/2017   HGB 13.0 07/19/2017   HCT 39.3 07/19/2017   PLT 237.0 07/19/2017   GLUCOSE 87 01/10/2018   CHOL 158 01/10/2018   TRIG 58.0 01/10/2018   HDL 82.90 01/10/2018   LDLCALC 63 01/10/2018   ALT 23 01/10/2018   AST 19 01/10/2018   NA 140 01/10/2018   K 4.2 01/10/2018   CL 103 01/10/2018   CREATININE 1.02 01/10/2018   BUN 17 01/10/2018   CO2 28 01/10/2018   TSH 2.16 01/10/2018   HGBA1C 5.3 01/10/2018   MICROALBUR 0.5 08/28/2013   BP Readings from Last 3 Encounters:  01/28/18 130/86  01/10/18 110/72  07/19/17 115/69    ASSESSMENT AND PLAN: Discussed the following assessment and plan:  URI with cough and congestion - Plan: POCT rapid strep A, Culture, Group A Strep, POC Influenza A&B(BINAX/QUICKVUE)  Exposure to strep throat - Plan: POCT rapid strep A, Culture, Group A Strep, POC Influenza A&B(BINAX/QUICKVUE)  Respiratory tract congestion with cough - Plan: POCT rapid strep A, Culture, Group A Strep, POC Influenza A&B(BINAX/QUICKVUE)  High risk medication use Laryngitis sinusitis  Uri  Viral vs other  Less likely to be strep  High risk   rx printed for augmentin if  needed  Advise delay embrel unless oked by rheym .  -Patient advised to return or notify health care team  if  new concerns arise.  Patient Instructions  Strep culture pending but suspect more likely to be viral or sinusitis .  If getting worse and face pain nasty dischsarge can begin antibiotic  As planned  You will be notified when   Strep cx is back   Rest and fluids     Mariann Laster K. Tasheena Wambolt M.D.

## 2018-01-28 NOTE — Patient Instructions (Signed)
Strep culture pending but suspect more likely to be viral or sinusitis .  If getting worse and face pain nasty dischsarge can begin antibiotic  As planned  You will be notified when   Strep cx is back   Rest and fluids

## 2018-01-29 NOTE — Telephone Encounter (Signed)
Patient seen in another office yesterday,

## 2018-01-30 LAB — CULTURE, GROUP A STREP
MICRO NUMBER:: 91551109
SPECIMEN QUALITY:: ADEQUATE

## 2018-02-20 DIAGNOSIS — M0609 Rheumatoid arthritis without rheumatoid factor, multiple sites: Secondary | ICD-10-CM | POA: Diagnosis not present

## 2018-02-20 DIAGNOSIS — G5603 Carpal tunnel syndrome, bilateral upper limbs: Secondary | ICD-10-CM | POA: Diagnosis not present

## 2018-02-28 ENCOUNTER — Encounter: Payer: Self-pay | Admitting: Family Medicine

## 2018-02-28 ENCOUNTER — Ambulatory Visit (INDEPENDENT_AMBULATORY_CARE_PROVIDER_SITE_OTHER): Payer: PPO | Admitting: Family Medicine

## 2018-02-28 VITALS — BP 120/82 | HR 86 | Temp 98.2°F | Ht 64.0 in | Wt 203.0 lb

## 2018-02-28 DIAGNOSIS — J324 Chronic pansinusitis: Secondary | ICD-10-CM | POA: Diagnosis not present

## 2018-02-28 DIAGNOSIS — J111 Influenza due to unidentified influenza virus with other respiratory manifestations: Secondary | ICD-10-CM | POA: Diagnosis not present

## 2018-02-28 MED ORDER — LEVOFLOXACIN 500 MG PO TABS: 500.0000 mg | ORAL_TABLET | Freq: Every day | ORAL | 0 refills | Status: AC

## 2018-02-28 MED ORDER — OSELTAMIVIR PHOSPHATE 75 MG PO CAPS: 75.0000 mg | ORAL_CAPSULE | Freq: Two times a day (BID) | ORAL | 0 refills | Status: DC

## 2018-02-28 NOTE — Patient Instructions (Addendum)

## 2018-02-28 NOTE — Progress Notes (Signed)
Patient ID: Michelle Mueller, female    DOB: [birth year]  Age: 53 y.o. MRN: [id number]    Subjective:  Subjective  HPI Michelle Mueller presents for sinus pressure, body aches and fever x 2 days.  Pt taking tylenol;/ advil for fever and body aches   + sinus pressure/ HA     Review of Systems  Constitutional: Negative for appetite change, diaphoresis, fatigue and unexpected weight change.  HENT: Positive for congestion and rhinorrhea.   Eyes: Negative for pain, redness and visual disturbance.  Respiratory: Negative for cough, chest tightness, shortness of breath and wheezing.   Cardiovascular: Negative for chest pain, palpitations and leg swelling.  Endocrine: Negative for cold intolerance, heat intolerance, polydipsia, polyphagia and polyuria.  Genitourinary: Negative for difficulty urinating, dysuria and frequency.  Musculoskeletal: Positive for myalgias.  Neurological: Negative for dizziness, light-headedness, numbness and headaches.    History Past Medical History:  Diagnosis Date  . Anxiety   . Arthritis   . Asthma    last attack 3 yrs ago  . Chicken pox   . Depression   . Diabetes mellitus without complication ([place])   . Fibromyalgia   . Goiter   . Hypertension    [name]  . Hypothyroidism   . Ovarian cyst   . PONV (postoperative nausea and vomiting)     She has a past surgical history that includes Cholecystectomy; Cesarean section; Tonsillectomy; Hernia repair; Lipoma excision; Tumor excision; Anterior lumbar fusion (12/18/2011); and Abdominal exposure (12/18/2011).   Her family history includes Cancer in her paternal grandfather; Colon cancer in her maternal aunt; Heart disease in her father; Lung cancer in her father; Pancreatic cancer in her maternal grandfather, maternal grandmother, and paternal grandmother.She reports that she has never smoked. She has never used smokeless tobacco. She reports that she does not drink alcohol or use drugs.  Current Outpatient  Medications on File Prior to Visit  Medication Sig Dispense Refill  . amoxicillin-clavulanate (AUGMENTIN) 875-125 MG tablet Take 1 tablet by mouth every 12 (twelve) hours. If needed  For strep or sinusitis 14 tablet 0  . atorvastatin (LIPITOR) 10 MG tablet TAKE 1 TABLET(10 MG) BY MOUTH DAILY 90 tablet 0  . buPROPion (WELLBUTRIN XL) 300 MG 24 hr tablet TAKE 1 TABLET(300 MG) BY MOUTH DAILY 90 tablet 0  . busPIRone (BUSPAR) 10 MG tablet TAKE 1 TABLET BY MOUTH THREE TIMES DAILY 180 tablet 0  . Calcium-Magnesium-Vitamin D (CALCIUM MAGNESIUM PO) Take 1 tablet by mouth daily.    . cetirizine (ZYRTEC) 5 MG tablet Take 5 mg by mouth daily.    . Cholecalciferol (VITAMIN D) 2000 units CAPS Take 1 capsule by mouth daily.    . clotrimazole-betamethasone (LOTRISONE) cream Apply 1 application topically 2 (two) times daily. 30 g 0  . cyclobenzaprine (FLEXERIL) 5 MG tablet   1  . estradiol (CLIMARA - DOSED IN MG/24 HR) 0.0375 mg/24hr patch Place 1 patch (0.0375 mg total) onto the skin 2 (two) times a week. 8 patch 6  . etanercept (ENBREL) 50 MG/ML injection Inject into the skin.    [name] gabapentin (NEURONTIN) 300 MG capsule Take 3 capsules by mouth at bedtime.     [name] glucose blood (ONE TOUCH ULTRA TEST) test strip Test blood sugar once daily. Dx code: 250.00 100 each 12  . lamoTRIgine (LAMICTAL) 200 MG tablet Take one tablet at bedtime. 90 tablet 0  . lamoTRIgine (LAMICTAL) 25 MG tablet Take 1 tablet (25 mg total) by mouth 2 (two) times daily.  This is in addtion to 200mg  . Total dose is 250mg  180 tablet 0  . levonorgestrel (MIRENA) 20 MCG/24HR IUD 1 each by Intrauterine route once.    [name] levothyroxine (SYNTHROID, LEVOTHROID) 50 MCG tablet Take 1 tablet (50 mcg total) by mouth daily before breakfast. 90 tablet 1  . lisinopril (PRINIVIL,ZESTRIL) 5 MG tablet Take 1 tablet (5 mg total) by mouth daily. 90 tablet 0  . lisinopril (PRINIVIL,ZESTRIL) 5 MG tablet TAKE 1 TABLET BY MOUTH EVERY DAY 90 tablet 0  . metFORMIN  (GLUCOPHAGE-XR) 500 MG 24 hr tablet Take 1 tablet (500 mg total) by mouth every evening. 90 tablet 1  . metroNIDAZOLE (FLAGYL) 500 MG tablet Take 1 tablet (500 mg total) by mouth 2 (two) times daily. 14 tablet 0  . Multiple Vitamin (MULTIVITAMIN WITH MINERALS) TABS Take 1 tablet by mouth daily.    . mupirocin ointment (BACTROBAN) 2 % APPLY TO LESION ON LEFT BREAST TWICE DAILY FOR 14 DAYS  0  . naproxen sodium (ANAPROX) 220 MG tablet Take 220 mg by mouth.    Glory Rosebush DELICA LANCETS 16R MISC Test blood sugar once daily. Dx code: 250.00 100 each 12  . OVER THE COUNTER MEDICATION Vitamin B-12 3000 mcg 1 table daily.    [name] OVER THE COUNTER MEDICATION Amberen 2 tablets every morning    . oxyCODONE 10 MG TABS Take 1 tablet (10 mg total) by mouth every 6 (six) hours as needed (for pain). 60 tablet 0  . Phenylephrine-Acetaminophen (TYLENOL SINUS CONGESTION/PAIN PO) Take 2 tablets by mouth daily as needed (for sinus congestion).     . tretinoin (RETIN-A) 0.025 % gel Apply 3 nights weekly x 2 weeks can increase to nightly only as tolerated    . [DISCONTINUED] escitalopram (LEXAPRO) 10 MG tablet TAKE 1 AND 1/2 TABLETS(15 MG) BY MOUTH DAILY 45 tablet 0  . [DISCONTINUED] FLUoxetine (PROZAC) 20 MG tablet Take 1 tablet (20 mg total) by mouth daily. 90 tablet 0   No current facility-administered medications on file prior to visit.      Objective:  Objective  Physical Exam Vitals signs and nursing note reviewed.  Constitutional:      Appearance: She is well-developed.  HENT:     Head: Normocephalic and atraumatic.     Nose: Congestion and rhinorrhea present.     Right Sinus: Maxillary sinus tenderness and frontal sinus tenderness present.     Left Sinus: Maxillary sinus tenderness and frontal sinus tenderness present.     Mouth/Throat:     Pharynx: Posterior oropharyngeal erythema present.  Eyes:     Conjunctiva/sclera: Conjunctivae normal.  Neck:     Musculoskeletal: Normal range of motion and neck  supple.     Thyroid: No thyromegaly.     Vascular: No carotid bruit or JVD.  Cardiovascular:     Rate and Rhythm: Normal rate and regular rhythm.     Heart sounds: Normal heart sounds. No murmur.  Pulmonary:     Effort: Pulmonary effort is normal. No respiratory distress.     Breath sounds: Normal breath sounds. No wheezing or rales.  Chest:     Chest wall: No tenderness.  Neurological:     Mental Status: She is alert and oriented to person, place, and time.    BP 120/82   Pulse 86   Temp 98.2 F (36.8 C)   Ht 5\' 4"  (1.626 m)   Wt 203 lb (92.1 kg)   SpO2 97%   BMI 34.84 kg/m  Wt  Readings from Last 3 Encounters:  02/28/18 203 lb (92.1 kg)  01/28/18 201 lb 6.4 oz (91.4 kg)  01/10/18 202 lb (91.6 kg)     Lab Results  Component Value Date   WBC 8.3 07/19/2017   HGB 13.0 07/19/2017   HCT 39.3 07/19/2017   PLT 237.0 07/19/2017   GLUCOSE 87 01/10/2018   CHOL 158 01/10/2018   TRIG 58.0 01/10/2018   HDL 82.90 01/10/2018   LDLCALC 63 01/10/2018   ALT 23 01/10/2018   AST 19 01/10/2018   NA 140 01/10/2018   K 4.2 01/10/2018   CL 103 01/10/2018   CREATININE 1.02 01/10/2018   BUN 17 01/10/2018   CO2 28 01/10/2018   TSH 2.16 01/10/2018   HGBA1C 5.3 01/10/2018   MICROALBUR 0.5 08/28/2013    No results found.   Assessment & Plan:  Plan  I am having Bethann A. Teti start on levofloxacin and oseltamivir. I am also having her maintain her Phenylephrine-Acetaminophen (TYLENOL SINUS CONGESTION/PAIN PO), multivitamin with minerals, Oxycodone HCl, gabapentin, glucose blood, ONETOUCH DELICA LANCETS 72I, OVER THE COUNTER MEDICATION, cetirizine, cyclobenzaprine, levonorgestrel, Calcium-Magnesium-Vitamin D (CALCIUM MAGNESIUM PO), OVER THE COUNTER MEDICATION, lisinopril, naproxen sodium, Vitamin D, mupirocin ointment, tretinoin, atorvastatin, buPROPion, busPIRone, lamoTRIgine, levothyroxine, estradiol, lisinopril, lamoTRIgine, metroNIDAZOLE, etanercept, clotrimazole-betamethasone,  metFORMIN, and amoxicillin-clavulanate.  Meds ordered this encounter  Medications  . levofloxacin (LEVAQUIN) 500 MG tablet    Sig: Take 1 tablet (500 mg total) by mouth daily.    Dispense:  7 tablet    Refill:  0  . oseltamivir (TAMIFLU) 75 MG capsule    Sig: Take 1 capsule (75 mg total) by mouth 2 (two) times daily.    Dispense:  10 capsule    Refill:  0    Problem List Items Addressed This Visit      Unprioritized   Influenza - Primary    Rest  Fluids tamiflu for 5 day  Tylenol/ advil for fever rto prn      Relevant Medications   oseltamivir (TAMIFLU) 75 MG capsule   Pansinusitis   Relevant Medications   levofloxacin (LEVAQUIN) 500 MG tablet   oseltamivir (TAMIFLU) 75 MG capsule      Follow-up: Return if symptoms worsen or fail to improve.  Ann Held, DO

## 2018-02-28 NOTE — Assessment & Plan Note (Signed)
Rest  Fluids tamiflu for 5 day  Tylenol/ advil for fever rto prn

## 2018-03-04 ENCOUNTER — Other Ambulatory Visit (HOSPITAL_COMMUNITY): Payer: Self-pay | Admitting: Psychiatry

## 2018-03-14 DIAGNOSIS — Z6834 Body mass index (BMI) 34.0-34.9, adult: Secondary | ICD-10-CM | POA: Diagnosis not present

## 2018-03-14 DIAGNOSIS — I10 Essential (primary) hypertension: Secondary | ICD-10-CM | POA: Diagnosis not present

## 2018-03-14 DIAGNOSIS — M961 Postlaminectomy syndrome, not elsewhere classified: Secondary | ICD-10-CM | POA: Diagnosis not present

## 2018-03-14 DIAGNOSIS — M0600 Rheumatoid arthritis without rheumatoid factor, unspecified site: Secondary | ICD-10-CM | POA: Diagnosis not present

## 2018-03-19 ENCOUNTER — Encounter: Payer: Self-pay | Admitting: Certified Nurse Midwife

## 2018-03-19 ENCOUNTER — Ambulatory Visit (INDEPENDENT_AMBULATORY_CARE_PROVIDER_SITE_OTHER): Payer: PPO | Admitting: Certified Nurse Midwife

## 2018-03-19 ENCOUNTER — Ambulatory Visit (INDEPENDENT_AMBULATORY_CARE_PROVIDER_SITE_OTHER): Payer: PPO | Admitting: Psychiatry

## 2018-03-19 ENCOUNTER — Other Ambulatory Visit (HOSPITAL_COMMUNITY)
Admission: RE | Admit: 2018-03-19 | Discharge: 2018-03-19 | Disposition: A | Discharge status: Home / Self Care | Payer: PPO | Source: Ambulatory Visit | Attending: Certified Nurse Midwife | Admitting: Certified Nurse Midwife

## 2018-03-19 ENCOUNTER — Encounter (HOSPITAL_COMMUNITY): Payer: Self-pay | Admitting: Psychiatry

## 2018-03-19 VITALS — BP 118/74 | HR 76 | Ht 65.0 in | Wt 208.0 lb

## 2018-03-19 VITALS — BP 123/80 | HR 80 | Resp 16 | Ht 65.0 in | Wt 208.0 lb

## 2018-03-19 DIAGNOSIS — M549 Dorsalgia, unspecified: Secondary | ICD-10-CM

## 2018-03-19 DIAGNOSIS — N898 Other specified noninflammatory disorders of vagina: Secondary | ICD-10-CM

## 2018-03-19 DIAGNOSIS — G8929 Other chronic pain: Secondary | ICD-10-CM | POA: Diagnosis not present

## 2018-03-19 DIAGNOSIS — F331 Major depressive disorder, recurrent, moderate: Secondary | ICD-10-CM

## 2018-03-19 DIAGNOSIS — F411 Generalized anxiety disorder: Secondary | ICD-10-CM | POA: Diagnosis not present

## 2018-03-19 MED ORDER — LAMOTRIGINE 100 MG PO TABS: 100.0000 mg | ORAL_TABLET | Freq: Every morning | ORAL | 1 refills | Status: DC

## 2018-03-19 MED ORDER — LEVOMILNACIPRAN HCL ER 20 MG PO CP24: 20.0000 mg | ORAL_CAPSULE | Freq: Every day | ORAL | 0 refills | Status: DC

## 2018-03-19 MED ORDER — TERCONAZOLE 0.4 % VA CREA: 1.0000 | TOPICAL_CREAM | Freq: Every day | VAGINAL | 0 refills | Status: AC

## 2018-03-19 NOTE — Patient Instructions (Signed)
Alternative Vaginitis Therapies  1) soak in tub of warm water waist high with 1/2 cup of baking soda in water for ~ 20 mins. 2) soak 3 tampons in 1 tablespoon of fractionated (liquid form) coconut oil with 10 drops of Melaleuca (Tea Tree) essential oil, insert 1 saturated tampon vaginally at bedtime x 3 days. Both options are to be done after sexual intercourse, menses and when suspects BV and/or yeast infection. Advised that these alternatives will not replace the need to be evaluated, if sx's persist. You will need to seek care at an OB/GYN provider.  GO WHITE: Soap: UNSCENTED Dove (white box light green writing) Laundry detergent (underwear)- Dreft or [employer] unscented WHITE 100% cotton panties (NOT just cotton crouch) Sanitary napkin/panty liners: UNSCENTED.  If it doesn't SAY unscented it can have a scent/perfume    NO PERFUMES OR LOTIONS OR POTIONS in the vulvar area (may use regular [place]) Condoms: hypoallergenic only. Non dyed (no color) Toilet papers: white only Wash clothes: use a separate wash cloth. WHITE.  Wash in Dreft.   You can purchase Tea Tree Oil locally at:  [employer] [facility] [employer] [address] [place], [place] [address] [phone]  [employer] [address] [place], [place] [address] [phone]  [facility] [address] [place], [place] [address] [phone] 

## 2018-03-19 NOTE — Progress Notes (Signed)
History:  Michelle Mueller is a 53 y.o. G2P2 who presents to clinic today for vaginal discharge and recurrent yeast/bacterial vaginosis. She reports being on medication for rheumatoid arthritis that she believes to be causing vaginitis. She denies IC, currently has a Mirena in place that was placed in 2016. Describes vaginal discharge as yellowish orange thick mucous discharge without odor.  She reports intermittent vaginal itching that is associated with discharge.   The following portions of the patient's history were reviewed and updated as appropriate: allergies, current medications, family history, past medical history, social history, past surgical history and problem list.  Review of Systems:  Review of Systems  Constitutional: Negative.   Respiratory: Negative.   Cardiovascular: Negative.   Gastrointestinal: Negative.   Genitourinary:       Positive Vaginal discharge     Objective:  Physical Exam BP 123/80   Pulse 80   Resp 16   Ht 5\' 5"  (1.651 m)   Wt 208 lb (94.3 kg)   BMI 34.61 kg/m  Physical Exam Exam conducted with a chaperone present.  Constitutional:      General: She is not in acute distress.    Appearance: Normal appearance. She is normal weight.  Cardiovascular:     Rate and Rhythm: Normal rate and regular rhythm.  Pulmonary:     Effort: Pulmonary effort is normal. No respiratory distress.     Breath sounds: Normal breath sounds. No wheezing.  Abdominal:     General: There is no distension.     Palpations: Abdomen is soft.     Tenderness: There is no abdominal tenderness. There is no guarding.  Genitourinary:    Vagina: Vaginal discharge present. No bleeding or lesions.     Uterus: Normal. Not tender.      Comments: Pelvic exam: Cervix pink, visually closed, IUD strings seen inside cervical os, without lesion, small amount of yellow curdy discharge present without odor, vaginal walls and external genitalia normal Neurological:     Mental Status: She is  alert and oriented to person, place, and time.    Assessment & Plan:  1. Vaginal discharge - Has been treated with diflucan x2 over the past 3-4 months per patient. Has not tried OTC medication or vaginal creams for treatment  - Discussed alternative vaginitis therapies and changes to habits with soaps, underwear and laundry detergent  - Treatment for yeast infection with terazol, results pending on BV will manage accordingly with patient   - Cervicovaginal ancillary only( [facility]) - terconazole (TERAZOL 7) 0.4 % vaginal cream; Place 1 applicator vaginally at bedtime. Use for seven days  Dispense: 45 g; Refill: 0   Michelle Mueller, [place] 03/19/2018 2:49 PM

## 2018-03-19 NOTE — Progress Notes (Signed)
Patient ID: Michelle Mueller, female   DOB: Apr 28, 1965, 53 y.o.   MRN: [id number]  Psychiatric Outpatient Follow up Visit  Patient Identification: Michelle Mueller MRN:  [id number] Date of Evaluation:  03/19/2018 Referral Source: [name] Chief Complaint:    Visit Diagnosis: MDD. GAD . Mood related to Chronic back pain Diagnosis:   Patient Active Problem List   Diagnosis Date Noted  . Influenza [J11.1] 02/28/2018  . Pansinusitis [J32.4] 02/28/2018  . Rash [R21] 01/13/2018  . Hypothyroidism [E03.9] 01/13/2018  . Hyperlipidemia associated with type 2 diabetes mellitus ([place]) [E11.69, E78.5] 07/30/2016  . Rheumatoid arthritis of multiple sites with negative rheumatoid factor ([place]) [M06.09] 03/13/2016  . Chest pain of uncertain etiology [H84.69] 12/21/2014  . Essential hypertension [I10] 12/21/2014  . Myalgia [M79.10] 11/10/2014  . Insomnia [G47.00] 11/10/2014  . UTI (urinary tract infection) [N39.0] 08/04/2013  . Fibromyalgia muscle pain [M79.7] 06/13/2013  . Severe depression ([place]) [F32.2] 06/13/2013  . 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, without long-term current use of insulin ([place]) [E11.620] 06/13/2013  . Rheumatoid arthritis ([place]) [M06.9] 03/19/2013  . Fibromyalgia [M79.7] 03/19/2013  . Unspecified hypothyroidism [E03.9] 03/19/2013  . Goiter [E04.9] 03/19/2013  . Obesity (BMI 30-39.9) [E66.9] 03/19/2013  . 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 Family history of pancreatic cancer [Z80.0] 01/18/2011  . Chronic diarrhea [K52.9] 01/18/2011   History of Present Illness:   Patient returns for follow up visit  suffers from RA . Pain effects mood . C  Feeling down, subdued decreased motivation decreased energy planning to move to [place] but still wants to follow-up with the providers here including this clinic Lamictal at a dose of 250 mg Had allergy to medicine now on Enbrel  Also has gone through weakness and fluid also  suffers from fibromyalgia   no rash BuSpar helps anxiety but it fluctuates Son goes to college Modifying factor: kids Severity of depression: subdued Timing:morning and when in pain  associated symptoms: back pain  Past Medical History:  Past Medical History:  Diagnosis Date  . Anxiety   . Arthritis   . Asthma    last attack 3 yrs ago  . Chicken pox   . Depression   . Diabetes mellitus without complication ([place])   . Fibromyalgia   . Goiter   . Hypertension    [name]  . Hypothyroidism   . Ovarian cyst   . PONV (postoperative nausea and vomiting)     Past Surgical History:  Procedure Laterality Date  . ABDOMINAL EXPOSURE  12/18/2011   Procedure: ABDOMINAL EXPOSURE;  Surgeon: [name], MD;  Location: [facility]UR[facility];  Service: Vascular;  Laterality: N/A;  Anterior Expossure for anterior lumbar interbody fuson  . ANTERIOR LUMBAR FUSION  12/18/2011   Procedure: ANTERIOR LUMBAR FUSION 1 LEVEL;  Surgeon: [name], MD;  Location: [facility]UR[facility] ORS;  Service: Neurosurgery;  Laterality: N/A;  Lumbar four-five Anterior Lumbar Interbody Fusion with Dr. [name] to do anterior exposure  . CESAREAN SECTION    . CHOLECYSTECTOMY    . HERNIA REPAIR    . LIPOMA EXCISION     L breast  . TONSILLECTOMY    . TUMOR EXCISION     Nerve sheath tumor, R arm   Family History:  Family History  Problem Relation Age of Onset  . Heart disease Father   . Lung cancer Father   . Colon cancer Maternal Aunt   . Pancreatic cancer Maternal Grandmother   .  Pancreatic cancer Maternal Grandfather   . Pancreatic cancer Paternal Grandmother   . Cancer Paternal Grandfather   . Sexual abuse Neg Hx    Social History:   Social History   Socioeconomic History  . Marital status: Divorced    Spouse name: Not on file  . Number of children: Not on file  . Years of education: Not on file  . Highest education level: Not on file  Occupational History  . Not on file  Social Needs  . Financial resource  strain: Not on file  . Food insecurity:    Worry: Not on file    Inability: Not on file  . Transportation needs:    Medical: Not on file    Non-medical: Not on file  Tobacco Use  . Smoking status: Never Smoker  . Smokeless tobacco: Never Used  Substance and Sexual Activity  . Alcohol use: No    Alcohol/week: 0.0 standard drinks  . Drug use: No  . Sexual activity: Not Currently    Birth control/protection: I.U.D., None  Lifestyle  . Physical activity:    Days per week: Not on file    Minutes per session: Not on file  . Stress: Not on file  Relationships  . Social connections:    Talks on phone: Not on file    Gets together: Not on file    Attends religious service: Not on file    Active member of club or organization: Not on file    Attends meetings of clubs or organizations: Not on file    Relationship status: Not on file  Other Topics Concern  . Not on file  Social History Narrative  . Not on file      Psychiatric Specialty Exam:    Depression         Associated symptoms include does not have insomnia and no suicidal ideas.   Review of Systems  Constitutional: Negative for fever.  Cardiovascular: Negative for chest pain and palpitations.  Musculoskeletal: Positive for joint pain.  Skin: Negative for rash.  Neurological: Negative for tremors.  Psychiatric/Behavioral: Positive for depression. Negative for substance abuse and suicidal ideas. The patient does not have insomnia.     Blood pressure 118/74, pulse 76, height 5\' 5"  (1.651 m), weight 208 lb (94.3 kg), SpO2 95 %.Body mass index is 34.61 kg/m.  General Appearance: Well Groomed  Eye Contact:  Good  Speech:  Clear and Coherent  Volume:  Normal  Mood: subdued  Affect: congruent  Thought Process:  Coherent, Goal Directed, Intact, Linear and Logical  Orientation:  Full (Time, Place, and Person)  Thought Content:  WDL  Suicidal Thoughts:  No  Homicidal Thoughts:  No  Memory:  Immediate;   Good Recent;    Good Remote;   Good  Judgement:  Good  Insight:  Present  Psychomotor Activity:  Decreased  Concentration:  Fair  Recall:  Good  Fund of Knowledge:Good  Language: Good  Akathisia:  No  Handed:  Left  AIMS (if indicated):    Assets:  Communication Skills Desire for Improvement Financial Resources/Insurance Housing Resilience Transportation  ADL's:  Intact  Cognition: WNL  Sleep:  poor    Allergies:   Allergies  Allergen Reactions  . Erythromycin Other (See Comments)    arrhythmia  . Zocor  [Simvastatin-High Dose] Rash  . Doxycycline Other (See Comments)    Decreased BP and caused dizziness per patient  . Peroxide [Hydrogen Peroxide]    Current Medications: Current Outpatient Medications  Medication Sig Dispense Refill  . atorvastatin (LIPITOR) 10 MG tablet TAKE 1 TABLET(10 MG) BY MOUTH DAILY 90 tablet 0  . buPROPion (WELLBUTRIN XL) 300 MG 24 hr tablet TAKE 1 TABLET(300 MG) BY MOUTH DAILY 90 tablet 0  . busPIRone (BUSPAR) 10 MG tablet TAKE 1 TABLET BY MOUTH THREE TIMES DAILY 180 tablet 0  . Calcium-Magnesium-Vitamin D (CALCIUM MAGNESIUM PO) Take 1 tablet by mouth daily.    . cetirizine (ZYRTEC) 5 MG tablet Take 5 mg by mouth daily.    . Cholecalciferol (VITAMIN D) 2000 units CAPS Take 1 capsule by mouth daily.    . clotrimazole-betamethasone (LOTRISONE) cream Apply 1 application topically 2 (two) times daily. 30 g 0  . cyclobenzaprine (FLEXERIL) 5 MG tablet   1  . estradiol (CLIMARA - DOSED IN MG/24 HR) 0.0375 mg/24hr patch Place 1 patch (0.0375 mg total) onto the skin 2 (two) times a week. 8 patch 6  . etanercept (ENBREL) 50 MG/ML injection Inject into the skin.    [name] gabapentin (NEURONTIN) 300 MG capsule Take 3 capsules by mouth at bedtime.     [name] glucose blood (ONE TOUCH ULTRA TEST) test strip Test blood sugar once daily. Dx code: 250.00 100 each 12  . lamoTRIgine (LAMICTAL) 100 MG tablet Take 1 tablet (100 mg total) by mouth every morning. This is in addition to  200mg . Total dose now 300mg  per day 30 tablet 1  . lamoTRIgine (LAMICTAL) 200 MG tablet Take one tablet at bedtime. 90 tablet 0  . levofloxacin (LEVAQUIN) 500 MG tablet Take 1 tablet (500 mg total) by mouth daily. 7 tablet 0  . levonorgestrel (MIRENA) 20 MCG/24HR IUD 1 each by Intrauterine route once.    [name] levothyroxine (SYNTHROID, LEVOTHROID) 50 MCG tablet Take 1 tablet (50 mcg total) by mouth daily before breakfast. 90 tablet 1  . lisinopril (PRINIVIL,ZESTRIL) 5 MG tablet Take 1 tablet (5 mg total) by mouth daily. 90 tablet 0  . lisinopril (PRINIVIL,ZESTRIL) 5 MG tablet TAKE 1 TABLET BY MOUTH EVERY DAY 90 tablet 0  . metroNIDAZOLE (FLAGYL) 500 MG tablet Take 1 tablet (500 mg total) by mouth 2 (two) times daily. 14 tablet 0  . Multiple Vitamin (MULTIVITAMIN WITH MINERALS) TABS Take 1 tablet by mouth daily.    . mupirocin ointment (BACTROBAN) 2 % APPLY TO LESION ON LEFT BREAST TWICE DAILY FOR 14 DAYS  0  . naproxen sodium (ANAPROX) 220 MG tablet Take 220 mg by mouth.    Glory Rosebush DELICA LANCETS 18H MISC Test blood sugar once daily. Dx code: 250.00 100 each 12  . OVER THE COUNTER MEDICATION Vitamin B-12 3000 mcg 1 table daily.    [name] OVER THE COUNTER MEDICATION Amberen 2 tablets every morning    . oxyCODONE 10 MG TABS Take 1 tablet (10 mg total) by mouth every 6 (six) hours as needed (for pain). 60 tablet 0  . Phenylephrine-Acetaminophen (TYLENOL SINUS CONGESTION/PAIN PO) Take 2 tablets by mouth daily as needed (for sinus congestion).     [name] terconazole (TERAZOL 7) 0.4 % vaginal cream Place 1 applicator vaginally at bedtime. Use for seven days 45 g 0  . tretinoin (RETIN-A) 0.025 % gel Apply 3 nights weekly x 2 weeks can increase to nightly only as tolerated    . amoxicillin-clavulanate (AUGMENTIN) 875-125 MG tablet Take 1 tablet by mouth every 12 (twelve) hours. If needed  For strep or sinusitis (Patient not taking: Reported on 03/19/2018) 14 tablet 0  . Levomilnacipran HCl ER 20 MG  CP24 Take 20 mg by  mouth daily. 30 capsule 0  . metFORMIN (GLUCOPHAGE-XR) 500 MG 24 hr tablet Take 1 tablet (500 mg total) by mouth every evening. 90 tablet 1  . oseltamivir (TAMIFLU) 75 MG capsule Take 1 capsule (75 mg total) by mouth 2 (two) times daily. (Patient not taking: Reported on 03/19/2018) 10 capsule 0   No current facility-administered medications for this visit.      Treatment Plan Summary: Medication management  1. Depression:: dysphoric, increase lamictal to 100mg  qd, 200mg  qhs Add fetzima 20mg  for one week, call back in 10 days if wants to increase Reviewed side effects  continue wellbutrin 2. Anxiety fluctautes, continue buspar, add fetzima 3. Insomnia:manageable,  4. Back pain:chronic. Working with providers and symptomatic management. FU for RA   now on  Embral   Fu 4w or early, consider therapy, refuses To call back if moves to [place] or [place],  Add more Me time and activitiy time to distract from Laverle Hobby, MD  3:16 PM 03/19/2018

## 2018-03-21 LAB — CERVICOVAGINAL ANCILLARY ONLY
Bacterial vaginitis: POSITIVE — AB
Candida vaginitis: POSITIVE — AB

## 2018-03-22 DIAGNOSIS — B9689 Other specified bacterial agents as the cause of diseases classified elsewhere: Secondary | ICD-10-CM

## 2018-03-22 DIAGNOSIS — N760 Acute vaginitis: Principal | ICD-10-CM

## 2018-03-22 MED ORDER — METRONIDAZOLE 500 MG PO TABS: 500.0000 mg | ORAL_TABLET | Freq: Two times a day (BID) | ORAL | 0 refills | Status: DC

## 2018-03-25 ENCOUNTER — Telehealth: Payer: Self-pay | Admitting: *Deleted

## 2018-03-25 MED ORDER — ESTRADIOL 0.05 MG/24HR TD PTWK: MEDICATED_PATCH | TRANSDERMAL | 5 refills | Status: DC

## 2018-03-25 NOTE — Telephone Encounter (Signed)
Pt called requesting a RF on her [place] patches.  She states that she had gone down to .375 but became symptomatic and went back up to .05.  RX sent to [facility] in [place].  Annual due in June

## 2018-04-14 ENCOUNTER — Other Ambulatory Visit: Payer: Self-pay | Admitting: Family Medicine

## 2018-04-14 DIAGNOSIS — E1165 Type 2 diabetes mellitus with hyperglycemia: Principal | ICD-10-CM

## 2018-04-14 DIAGNOSIS — E119 Type 2 diabetes mellitus without complications: Secondary | ICD-10-CM

## 2018-04-14 DIAGNOSIS — IMO0001 Reserved for inherently not codable concepts without codable children: Secondary | ICD-10-CM

## 2018-04-15 ENCOUNTER — Telehealth (HOSPITAL_COMMUNITY): Payer: Self-pay

## 2018-04-15 ENCOUNTER — Other Ambulatory Visit (HOSPITAL_COMMUNITY): Payer: Self-pay

## 2018-04-15 MED ORDER — BUSPIRONE HCL 10 MG PO TABS: ORAL_TABLET | ORAL | 0 refills | Status: DC

## 2018-04-15 MED ORDER — LEVOMILNACIPRAN HCL ER 20 MG PO CP24: 20.0000 mg | ORAL_CAPSULE | Freq: Every day | ORAL | 0 refills | Status: DC

## 2018-04-15 MED ORDER — LAMOTRIGINE 100 MG PO TABS: 100.0000 mg | ORAL_TABLET | Freq: Every morning | ORAL | 0 refills | Status: DC

## 2018-04-15 NOTE — Telephone Encounter (Signed)
Patient called requesting a 90 day supply for Lamictal, buspar, and fetzima. Sent medication to pharmacy

## 2018-04-30 ENCOUNTER — Encounter: Payer: Self-pay | Admitting: Family Medicine

## 2018-04-30 DIAGNOSIS — E039 Hypothyroidism, unspecified: Secondary | ICD-10-CM

## 2018-05-01 MED ORDER — LEVOTHYROXINE SODIUM 50 MCG PO TABS: 50.0000 ug | ORAL_TABLET | Freq: Every day | ORAL | 0 refills | Status: DC

## 2018-05-07 ENCOUNTER — Ambulatory Visit (HOSPITAL_COMMUNITY): Payer: PPO | Admitting: Psychiatry

## 2018-05-16 ENCOUNTER — Other Ambulatory Visit: Payer: Self-pay | Admitting: Family Medicine

## 2018-05-16 DIAGNOSIS — I10 Essential (primary) hypertension: Secondary | ICD-10-CM

## 2018-05-25 DIAGNOSIS — Z1389 Encounter for screening for other disorder: Secondary | ICD-10-CM | POA: Diagnosis not present

## 2018-05-25 DIAGNOSIS — R309 Painful micturition, unspecified: Secondary | ICD-10-CM | POA: Diagnosis not present

## 2018-05-25 DIAGNOSIS — R3915 Urgency of urination: Secondary | ICD-10-CM | POA: Diagnosis not present

## 2018-05-25 DIAGNOSIS — N390 Urinary tract infection, site not specified: Secondary | ICD-10-CM | POA: Diagnosis not present

## 2018-05-25 DIAGNOSIS — R350 Frequency of micturition: Secondary | ICD-10-CM | POA: Diagnosis not present

## 2018-05-26 ENCOUNTER — Other Ambulatory Visit: Payer: Self-pay | Admitting: Family Medicine

## 2018-05-26 DIAGNOSIS — E785 Hyperlipidemia, unspecified: Secondary | ICD-10-CM

## 2018-06-03 ENCOUNTER — Other Ambulatory Visit (HOSPITAL_COMMUNITY): Payer: Self-pay

## 2018-06-03 ENCOUNTER — Other Ambulatory Visit: Payer: Self-pay | Admitting: Family Medicine

## 2018-06-03 DIAGNOSIS — E785 Hyperlipidemia, unspecified: Secondary | ICD-10-CM

## 2018-06-03 MED ORDER — LAMOTRIGINE 200 MG PO TABS: ORAL_TABLET | ORAL | 0 refills | Status: DC

## 2018-06-03 MED ORDER — BUPROPION HCL ER (XL) 300 MG PO TB24: ORAL_TABLET | ORAL | 0 refills | Status: DC

## 2018-06-05 ENCOUNTER — Encounter: Payer: Self-pay | Admitting: Family Medicine

## 2018-06-05 ENCOUNTER — Other Ambulatory Visit: Payer: Self-pay

## 2018-06-05 ENCOUNTER — Ambulatory Visit (INDEPENDENT_AMBULATORY_CARE_PROVIDER_SITE_OTHER): Payer: PPO | Admitting: Family Medicine

## 2018-06-05 VITALS — BP 128/87 | HR 87 | Temp 96.2°F | Ht 65.0 in | Wt 197.0 lb

## 2018-06-05 DIAGNOSIS — M797 Fibromyalgia: Secondary | ICD-10-CM

## 2018-06-05 DIAGNOSIS — I10 Essential (primary) hypertension: Secondary | ICD-10-CM | POA: Diagnosis not present

## 2018-06-05 DIAGNOSIS — E785 Hyperlipidemia, unspecified: Secondary | ICD-10-CM | POA: Diagnosis not present

## 2018-06-05 DIAGNOSIS — N390 Urinary tract infection, site not specified: Secondary | ICD-10-CM | POA: Diagnosis not present

## 2018-06-05 MED ORDER — ATORVASTATIN CALCIUM 10 MG PO TABS: ORAL_TABLET | ORAL | 1 refills | Status: DC

## 2018-06-05 MED ORDER — FLUCONAZOLE 150 MG PO TABS: ORAL_TABLET | ORAL | 0 refills | Status: AC

## 2018-06-05 MED ORDER — CIPROFLOXACIN HCL 250 MG PO TABS: 250.0000 mg | ORAL_TABLET | Freq: Two times a day (BID) | ORAL | 0 refills | Status: DC

## 2018-06-05 NOTE — Progress Notes (Addendum)
Virtual Visit via Video Note  I connected with Michelle Mueller on 06/05/18 at  9:20 AM EDT by a video enabled telemedicine application and verified that I am speaking with the correct person using two identifiers.  Location: Patient: home  Provider: office   I discussed the limitations of evaluation and management by telemedicine and the availability of in person appointments. The patient expressed understanding and agreed to proceed.  History of Present Illness: Pt is home in [place]---  Seen in [facility] for uti--- put on bactrim----she still has frequency , dysuria.  She also needs refills and will come for labs next week.   No fevers, no back pain    Past Medical History:  Diagnosis Date  . Anxiety   . Arthritis   . Asthma    last attack 3 yrs ago  . Chicken pox   . Depression   . Diabetes mellitus without complication ([place])   . Fibromyalgia   . Goiter   . Hypertension    [name]  . Hypothyroidism   . Ovarian cyst   . PONV (postoperative nausea and vomiting)     Observations/Objective: Vitals:   06/05/18 0923  BP: 128/87  Pulse: 87  Temp: (!) 96.2 F (35.7 C)   Pt is in nad  rr normal  No sob , neatly dressed Assessment and Plan: 1. Hyperlipidemia, unspecified hyperlipidemia type Tolerating statin, encouraged heart healthy diet, avoid trans fats, minimize simple carbs and saturated fats. Increase exercise as tolerated - atorvastatin (LIPITOR) 10 MG tablet; TAKE 1 TABLET(10 MG) BY MOUTH DAILY  Dispense: 90 tablet; Refill: 1 - Lipid panel; Future - Comprehensive metabolic panel; Future  2. Urinary tract infection without hematuria, site unspecified Was seen in [facility] and treated with bactrim She still has symptoms Will tx with cipro and check urine and culture when she comes next week - ciprofloxacin (CIPRO) 250 MG tablet; Take 1 tablet (250 mg total) by mouth 2 (two) times daily.  Dispense: 6 tablet; Refill: 0 - POCT Urinalysis Dipstick (Automated);  Future - Urine Culture; Future  3. Essential hypertension Well controlled, no changes to meds. Encouraged heart healthy diet such as the DASH diet and exercise as tolerated.   4. Fibromyalgia muscle pain Per pain management    Follow Up Instructions:    I discussed the assessment and treatment plan with the patient. The patient was provided an opportunity to ask questions and all were answered. The patient agreed with the plan and demonstrated an understanding of the instructions.   The patient was advised to call back or seek an in-person evaluation if the symptoms worsen or if the condition fails to improve as anticipated.  I provided 25 minutes of non-face-to-face time during this encounter.   Ann Held, DO

## 2018-06-12 DIAGNOSIS — M0609 Rheumatoid arthritis without rheumatoid factor, multiple sites: Secondary | ICD-10-CM | POA: Diagnosis not present

## 2018-06-13 ENCOUNTER — Ambulatory Visit (INDEPENDENT_AMBULATORY_CARE_PROVIDER_SITE_OTHER): Payer: PPO | Admitting: Psychiatry

## 2018-06-13 ENCOUNTER — Encounter (HOSPITAL_COMMUNITY): Payer: Self-pay | Admitting: Psychiatry

## 2018-06-13 DIAGNOSIS — G8929 Other chronic pain: Secondary | ICD-10-CM | POA: Diagnosis not present

## 2018-06-13 DIAGNOSIS — M0600 Rheumatoid arthritis without rheumatoid factor, unspecified site: Secondary | ICD-10-CM | POA: Diagnosis not present

## 2018-06-13 DIAGNOSIS — F331 Major depressive disorder, recurrent, moderate: Secondary | ICD-10-CM

## 2018-06-13 DIAGNOSIS — F411 Generalized anxiety disorder: Secondary | ICD-10-CM | POA: Diagnosis not present

## 2018-06-13 DIAGNOSIS — M961 Postlaminectomy syndrome, not elsewhere classified: Secondary | ICD-10-CM | POA: Diagnosis not present

## 2018-06-13 DIAGNOSIS — M549 Dorsalgia, unspecified: Secondary | ICD-10-CM | POA: Diagnosis not present

## 2018-06-13 MED ORDER — LAMOTRIGINE 100 MG PO TABS: 100.0000 mg | ORAL_TABLET | Freq: Every morning | ORAL | 0 refills | Status: DC

## 2018-06-13 MED ORDER — LEVOMILNACIPRAN HCL ER 40 MG PO CP24: 40.0000 mg | ORAL_CAPSULE | Freq: Every day | ORAL | 1 refills | Status: DC

## 2018-06-13 NOTE — Progress Notes (Signed)
Patient ID: Michelle Mueller, female   DOB: 09-Mar-1965, 53 y.o.   MRN: [id number]  Psychiatric Outpatient Follow up Visit  Patient Identification: Michelle Mueller MRN:  [id number] Date of Evaluation:  06/13/2018 Referral Source: [name] Chief Complaint:    depression . Follow up Visit Diagnosis: MDD. GAD . Mood related to Chronic back pain Diagnosis:   Patient Active Problem List   Diagnosis Date Noted  . Influenza [J11.1] 02/28/2018  . Pansinusitis [J32.4] 02/28/2018  . Rash [R21] 01/13/2018  . Hypothyroidism [E03.9] 01/13/2018  . Hyperlipidemia associated with type 2 diabetes mellitus ([place]) [E11.69, E78.5] 07/30/2016  . Rheumatoid arthritis of multiple sites with negative rheumatoid factor ([place]) [M06.09] 03/13/2016  . Chest pain of uncertain etiology [Y17.49] 12/21/2014  . Essential hypertension [I10] 12/21/2014  . Myalgia [M79.10] 11/10/2014  . Insomnia [G47.00] 11/10/2014  . UTI (urinary tract infection) [N39.0] 08/04/2013  . Fibromyalgia muscle pain [M79.7] 06/13/2013  . Severe depression ([place]) [F32.2] 06/13/2013  . 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 Rheumatoid arthritis ([place]) [M06.9] 03/19/2013  . Fibromyalgia [M79.7] 03/19/2013  . Unspecified hypothyroidism [E03.9] 03/19/2013  . Goiter [E04.9] 03/19/2013  . Obesity (BMI 30-39.9) [E66.9] 03/19/2013  . 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 Family history of pancreatic cancer [Z80.0] 01/18/2011  . Chronic diarrhea [K52.9] 01/18/2011  I connected with Michelle Mueller on 06/13/18 at  1:00 PM EDT by a video enabled telemedicine application and verified that I am speaking with the correct person using two identifiers.   I discussed the limitations of evaluation and management by telemedicine and the availability of in person appointments. The patient expressed understanding and agreed to proceed.     patient suffers from RA . Pain effects mood . C Have moved to [place] but then pandemic happened so son moved in with her. Fells lonely there , pain effects mood and she then withdraws to bed Increased lamictal last visit and added fetzima. Some help but feels subdued, crying spells Pain meds can be keeping her down but she has to take for pain and arthritis    no rash BuSpar helps anxiety but it fluctuates Son goes to college but now virtual learning,  Modifying factor: kids Severity of depression:subdued Timing:morning and when in pain  associated symptoms: back pain  Past Medical History:  Past Medical History:  Diagnosis Date  . Anxiety   . Arthritis   . Asthma    last attack 3 yrs ago  . Chicken pox   . Depression   . Diabetes mellitus without complication ([place])   . Fibromyalgia   . Goiter   . Hypertension    [name]  . Hypothyroidism   . Ovarian cyst   . PONV (postoperative nausea and vomiting)     Past Surgical History:  Procedure Laterality Date  . ABDOMINAL EXPOSURE  12/18/2011   Procedure: ABDOMINAL EXPOSURE;  Surgeon: [name], MD;  Location: [facility];  Service: Vascular;  Laterality: N/A;  Anterior Expossure for anterior lumbar interbody fuson  . ANTERIOR LUMBAR FUSION  12/18/2011   Procedure: ANTERIOR LUMBAR FUSION 1 LEVEL;  Surgeon: [name], MD;  Location: [facility];  Service: Neurosurgery;  Laterality: N/A;  Lumbar four-five Anterior Lumbar Interbody Fusion with Dr. [name] to do anterior exposure  . CESAREAN SECTION    . CHOLECYSTECTOMY    . HERNIA REPAIR    . LIPOMA EXCISION  L breast  . TONSILLECTOMY    . TUMOR EXCISION     Nerve sheath tumor, R arm   Family History:  Family History  Problem Relation Age of Onset  . Heart disease Father   . Lung cancer Father   . Colon cancer Maternal Aunt   . Pancreatic cancer Maternal Grandmother   . Pancreatic cancer Maternal Grandfather   . Pancreatic cancer Paternal Grandmother   .  Cancer Paternal Grandfather   . Sexual abuse Neg Hx    Social History:   Social History   Socioeconomic History  . Marital status: Divorced    Spouse name: Not on file  . Number of children: Not on file  . Years of education: Not on file  . Highest education level: Not on file  Occupational History  . Not on file  Social Needs  . Financial resource strain: Not on file  . Food insecurity:    Worry: Not on file    Inability: Not on file  . Transportation needs:    Medical: Not on file    Non-medical: Not on file  Tobacco Use  . Smoking status: Never Smoker  . Smokeless tobacco: Never Used  Substance and Sexual Activity  . Alcohol use: No    Alcohol/week: 0.0 standard drinks  . Drug use: No  . Sexual activity: Not Currently    Birth control/protection: I.U.D., None  Lifestyle  . Physical activity:    Days per week: Not on file    Minutes per session: Not on file  . Stress: Not on file  Relationships  . Social connections:    Talks on phone: Not on file    Gets together: Not on file    Attends religious service: Not on file    Active member of club or organization: Not on file    Attends meetings of clubs or organizations: Not on file    Relationship status: Not on file  Other Topics Concern  . Not on file  Social History Narrative  . Not on file      Psychiatric Specialty Exam:    Depression          Associated symptoms include does not have insomnia and no suicidal ideas.   Review of Systems  Constitutional: Negative for fever.  Cardiovascular: Negative for chest pain and palpitations.  Musculoskeletal: Positive for joint pain.  Skin: Negative for rash.  Neurological: Negative for tremors.  Psychiatric/Behavioral: Positive for depression. Negative for substance abuse and suicidal ideas. The patient does not have insomnia.     There were no vitals taken for this visit.There is no height or weight on file to calculate BMI.  General Appearance: fair   Eye Contact:  Good  Speech:  Clear and Coherent  Volume:  Normal  Mood: subdued  Affect: congruent  Thought Process:  Coherent, Goal Directed, Intact, Linear and Logical  Orientation:  Full (Time, Place, and Person)  Thought Content:  WDL  Suicidal Thoughts:  No  Homicidal Thoughts:  No  Memory:  Immediate;   Good Recent;   Good Remote;   Good  Judgement:  Good  Insight:  Present  Psychomotor Activity:  Decreased  Concentration:  Fair  Recall:  Good  Fund of Knowledge:Good  Language: Good  Akathisia:  No  Handed:  Left  AIMS (if indicated):    Assets:  Communication Skills Desire for Improvement Financial Resources/Insurance Housing Resilience Transportation  ADL's:  Intact  Cognition: WNL  Sleep:  poor  Allergies:   Allergies  Allergen Reactions  . Erythromycin Other (See Comments)    arrhythmia  . Zocor  [Simvastatin-High Dose] Rash  . Doxycycline Other (See Comments)    Decreased BP and caused dizziness per patient  . Peroxide [Hydrogen Peroxide]    Current Medications: Current Outpatient Medications  Medication Sig Dispense Refill  . atorvastatin (LIPITOR) 10 MG tablet TAKE 1 TABLET(10 MG) BY MOUTH DAILY 90 tablet 1  . buPROPion (WELLBUTRIN XL) 300 MG 24 hr tablet Take one tablet by mouth daily. 90 tablet 0  . busPIRone (BUSPAR) 10 MG tablet TAKE 1 TABLET BY MOUTH THREE TIMES DAILY 180 tablet 0  . Calcium-Magnesium-Vitamin D (CALCIUM MAGNESIUM PO) Take 1 tablet by mouth daily.    . cetirizine (ZYRTEC) 5 MG tablet Take 5 mg by mouth daily.    . Cholecalciferol (VITAMIN D) 2000 units CAPS Take 1 capsule by mouth daily.    . ciprofloxacin (CIPRO) 250 MG tablet Take 1 tablet (250 mg total) by mouth 2 (two) times daily. 6 tablet 0  . clotrimazole-betamethasone (LOTRISONE) cream Apply 1 application topically 2 (two) times daily. 30 g 0  . cyclobenzaprine (FLEXERIL) 5 MG tablet   1  . estradiol (CLIMARA - DOSED IN MG/24 HR) 0.05 mg/24hr patch Place patch onto  skin twice weekly 8 patch 5  . etanercept (ENBREL) 50 MG/ML injection Inject into the skin.    . fluconazole (DIFLUCAN) 150 MG tablet 1 po x1, may repeat in 3 days prn 2 tablet 0  . gabapentin (NEURONTIN) 300 MG capsule Take 3 capsules by mouth at bedtime.     [name] glucose blood (ONE TOUCH ULTRA TEST) test strip Test blood sugar once daily. Dx code: 250.00 100 each 12  . lamoTRIgine (LAMICTAL) 100 MG tablet Take 1 tablet (100 mg total) by mouth every morning. This is in addition to 200mg . Total dose now 300mg  per day 90 tablet 0  . lamoTRIgine (LAMICTAL) 200 MG tablet Take one tablet at bedtime. 90 tablet 0  . levofloxacin (LEVAQUIN) 500 MG tablet Take 1 tablet (500 mg total) by mouth daily. 7 tablet 0  . Levomilnacipran HCl ER 20 MG CP24 Take 20 mg by mouth daily. 90 capsule 0  . Levomilnacipran HCl ER 40 MG CP24 Take 40 mg by mouth daily. 30 capsule 1  . levonorgestrel (MIRENA) 20 MCG/24HR IUD 1 each by Intrauterine route once.    [name] levothyroxine (SYNTHROID, LEVOTHROID) 50 MCG tablet Take 1 tablet (50 mcg total) by mouth daily before breakfast. 90 tablet 0  . lisinopril (PRINIVIL,ZESTRIL) 5 MG tablet Take 1 tablet (5 mg total) by mouth daily. 90 tablet 0  . lisinopril (ZESTRIL) 5 MG tablet TAKE 1 TABLET BY MOUTH EVERY DAY 90 tablet 1  . metFORMIN (GLUCOPHAGE-XR) 500 MG 24 hr tablet TAKE 1 TABLET BY MOUTH EVERY EVENING 90 tablet 0  . Multiple Vitamin (MULTIVITAMIN WITH MINERALS) TABS Take 1 tablet by mouth daily.    . mupirocin ointment (BACTROBAN) 2 % APPLY TO LESION ON LEFT BREAST TWICE DAILY FOR 14 DAYS  0  . naproxen sodium (ANAPROX) 220 MG tablet Take 220 mg by mouth.    Glory Rosebush DELICA LANCETS 13K MISC Test blood sugar once daily. Dx code: 250.00 100 each 12  . OVER THE COUNTER MEDICATION Vitamin B-12 3000 mcg 1 table daily.    [name] OVER THE COUNTER MEDICATION Amberen 2 tablets every morning    . oxyCODONE 10 MG TABS Take 1 tablet (10 mg total) by  mouth every 6 (six) hours as needed (for  pain). 60 tablet 0  . Phenylephrine-Acetaminophen (TYLENOL SINUS CONGESTION/PAIN PO) Take 2 tablets by mouth daily as needed (for sinus congestion).     [name] terconazole (TERAZOL 7) 0.4 % vaginal cream Place 1 applicator vaginally at bedtime. Use for seven days 45 g 0  . tretinoin (RETIN-A) 0.025 % gel Apply 3 nights weekly x 2 weeks can increase to nightly only as tolerated     No current facility-administered medications for this visit.      Treatment Plan Summary: Medication management  1. Depression:: dysphoric, increase fetzima to 40mg  continue lamictal, wellbutrin. lamictal is at 300mg  daily dose. Reviewed side effects  continue wellbutrin 2. Anxiety fluctautes, continue buspar, and fetzima 3. Insomnia:manageable, avoid daytime napes 4. Back pain:chronic. Working with providers and symptomatic management. FU for RA    Add more Me time and activitiy time to distract from Laverle Hobby, MD  1:14 PM 06/13/2018

## 2018-06-25 ENCOUNTER — Other Ambulatory Visit: Payer: Self-pay | Admitting: Obstetrics & Gynecology

## 2018-07-03 ENCOUNTER — Other Ambulatory Visit: Payer: Self-pay | Admitting: Obstetrics & Gynecology

## 2018-07-03 ENCOUNTER — Other Ambulatory Visit: Payer: Self-pay | Admitting: Family Medicine

## 2018-07-03 DIAGNOSIS — Z1231 Encounter for screening mammogram for malignant neoplasm of breast: Secondary | ICD-10-CM

## 2018-07-11 ENCOUNTER — Other Ambulatory Visit (HOSPITAL_COMMUNITY): Payer: Self-pay

## 2018-07-11 MED ORDER — BUSPIRONE HCL 10 MG PO TABS: ORAL_TABLET | ORAL | 0 refills | Status: DC

## 2018-07-11 MED ORDER — LEVOMILNACIPRAN HCL ER 40 MG PO CP24: 40.0000 mg | ORAL_CAPSULE | Freq: Every day | ORAL | 0 refills | Status: DC

## 2018-07-19 ENCOUNTER — Ambulatory Visit (INDEPENDENT_AMBULATORY_CARE_PROVIDER_SITE_OTHER): Payer: PPO | Admitting: Certified Nurse Midwife

## 2018-07-19 ENCOUNTER — Other Ambulatory Visit (HOSPITAL_COMMUNITY)
Admission: RE | Admit: 2018-07-19 | Discharge: 2018-07-19 | Disposition: A | Discharge status: Home / Self Care | Payer: PPO | Source: Ambulatory Visit | Attending: Certified Nurse Midwife | Admitting: Certified Nurse Midwife

## 2018-07-19 ENCOUNTER — Other Ambulatory Visit: Payer: Self-pay

## 2018-07-19 ENCOUNTER — Encounter: Payer: Self-pay | Admitting: Certified Nurse Midwife

## 2018-07-19 VITALS — BP 145/86 | HR 105 | Wt 192.0 lb

## 2018-07-19 DIAGNOSIS — N898 Other specified noninflammatory disorders of vagina: Secondary | ICD-10-CM

## 2018-07-19 DIAGNOSIS — Z113 Encounter for screening for infections with a predominantly sexual mode of transmission: Secondary | ICD-10-CM | POA: Diagnosis not present

## 2018-07-19 DIAGNOSIS — M069 Rheumatoid arthritis, unspecified: Secondary | ICD-10-CM | POA: Diagnosis not present

## 2018-07-19 DIAGNOSIS — Z79899 Other long term (current) drug therapy: Secondary | ICD-10-CM | POA: Diagnosis not present

## 2018-07-19 NOTE — Progress Notes (Signed)
Pt wants STI testing

## 2018-07-19 NOTE — Progress Notes (Signed)
   GYNECOLOGY OFFICE VISIT NOTE  History:  53 y.o. G2P2 here today for recurrent vaginal malodor. She denies any abnormal vaginal discharge, bleeding, pelvic pain or other concerns. She has been treated several times in the last year for BV. Has a new sexual partner x2 mos, used condoms initially but not now, would like STD screen. She has Mirena IUD in place. She is using estrogen patches for hot flashes.   Past Medical History:  Diagnosis Date  . Anxiety   . Arthritis   . Asthma    last attack 3 yrs ago  . Chicken pox   . Depression   . Diabetes mellitus without complication ([place])   . Fibromyalgia   . Goiter   . Hypertension    [name]  . Hypothyroidism   . Ovarian cyst   . PONV (postoperative nausea and vomiting)     Past Surgical History:  Procedure Laterality Date  . ABDOMINAL EXPOSURE  12/18/2011   Procedure: ABDOMINAL EXPOSURE;  Surgeon: [name], MD;  Location: [facility];  Service: Vascular;  Laterality: N/A;  Anterior Expossure for anterior lumbar interbody fuson  . ANTERIOR LUMBAR FUSION  12/18/2011   Procedure: ANTERIOR LUMBAR FUSION 1 LEVEL;  Surgeon: [name], MD;  Location: [facility];  Service: Neurosurgery;  Laterality: N/A;  Lumbar four-five Anterior Lumbar Interbody Fusion with Dr. [name] to do anterior exposure  . CESAREAN SECTION    . CHOLECYSTECTOMY    . HERNIA REPAIR    . LIPOMA EXCISION     L breast  . TONSILLECTOMY    . TUMOR EXCISION     Nerve sheath tumor, R arm    The following portions of the patient's history were reviewed and updated as appropriate: allergies, current medications, past family history, past medical history, past social history, past surgical history and problem list.   Health Maintenance:  Normal pap and negative HRHPV on 07/02/17.  Normal mammogram on 07/05/17.   Review of Systems:  Genito-Urinary ROS: positive for - dyspareunia and vaginal malodor negative for - vaginal discharge, pelvic pain  Objective:   Physical Exam BP (!) 145/86   Pulse (!) 105   Wt 87.1 kg   BMI 31.95 kg/m  CONSTITUTIONAL: Well-developed, well-nourished female in no acute distress.  HENT:  Normocephalic, atraumatic EYES: Conjunctivae and EOM are normal NECK: Normal range of motion SKIN: Skin is warm and dry NEUROLOGIC: Alert and oriented to person, place, and time PSYCHIATRIC: Normal mood and affect RESPIRATORY: Effort and rate normal  PELVIC: pink, moist mucosa, scant clear discharge, normal cervix- no strings seen MUSCULOSKELETAL: Normal range of motion  Labs and Imaging No results found.  Assessment & Plan:   1. Screening for STDs (sexually transmitted diseases)   2. Vaginal odor    Recommend lactobacillus and acidophilus probiotic daily If BV positive again may need boric acid tabs pv weekly Follow up as needed  Julianne Handler, CNM 07/19/2018 5:27 PM

## 2018-07-22 LAB — HEPATITIS B SURFACE ANTIGEN: Hepatitis B Surface Ag: NONREACTIVE

## 2018-07-22 LAB — HIV ANTIBODY (ROUTINE TESTING W REFLEX): HIV 1&2 Ab, 4th Generation: NONREACTIVE

## 2018-07-22 LAB — RPR: RPR Ser Ql: NONREACTIVE

## 2018-07-22 LAB — HEPATITIS C ANTIBODY
Hepatitis C Ab: NONREACTIVE
SIGNAL TO CUT-OFF: 0.01 (ref <1.00)

## 2018-07-23 ENCOUNTER — Encounter: Payer: Self-pay | Admitting: Certified Nurse Midwife

## 2018-07-23 ENCOUNTER — Encounter: Payer: PPO | Admitting: Family Medicine

## 2018-07-23 LAB — CERVICOVAGINAL ANCILLARY ONLY
Bacterial vaginitis: NEGATIVE
Candida vaginitis: NEGATIVE
Chlamydia: NEGATIVE
Neisseria Gonorrhea: NEGATIVE
Trichomonas: NEGATIVE

## 2018-07-25 DIAGNOSIS — Z6832 Body mass index (BMI) 32.0-32.9, adult: Secondary | ICD-10-CM | POA: Diagnosis not present

## 2018-07-25 DIAGNOSIS — R109 Unspecified abdominal pain: Secondary | ICD-10-CM | POA: Diagnosis not present

## 2018-07-25 DIAGNOSIS — R1902 Left upper quadrant abdominal swelling, mass and lump: Secondary | ICD-10-CM | POA: Diagnosis not present

## 2018-07-25 DIAGNOSIS — Z7689 Persons encountering health services in other specified circumstances: Secondary | ICD-10-CM | POA: Diagnosis not present

## 2018-07-29 ENCOUNTER — Other Ambulatory Visit: Payer: Self-pay | Admitting: Family Medicine

## 2018-07-29 DIAGNOSIS — E039 Hypothyroidism, unspecified: Secondary | ICD-10-CM

## 2018-07-29 MED ORDER — LEVOTHYROXINE SODIUM 50 MCG PO TABS: 50.0000 ug | ORAL_TABLET | Freq: Every day | ORAL | 1 refills | Status: DC

## 2018-08-08 DIAGNOSIS — N281 Cyst of kidney, acquired: Secondary | ICD-10-CM | POA: Diagnosis not present

## 2018-08-08 DIAGNOSIS — I10 Essential (primary) hypertension: Secondary | ICD-10-CM | POA: Diagnosis not present

## 2018-08-08 DIAGNOSIS — R1902 Left upper quadrant abdominal swelling, mass and lump: Secondary | ICD-10-CM | POA: Diagnosis not present

## 2018-08-29 ENCOUNTER — Other Ambulatory Visit (HOSPITAL_COMMUNITY): Payer: Self-pay

## 2018-08-29 MED ORDER — LAMOTRIGINE 200 MG PO TABS: ORAL_TABLET | ORAL | 0 refills | Status: DC

## 2018-08-29 MED ORDER — BUPROPION HCL ER (XL) 300 MG PO TB24: ORAL_TABLET | ORAL | 0 refills | Status: DC

## 2018-08-30 DIAGNOSIS — L089 Local infection of the skin and subcutaneous tissue, unspecified: Secondary | ICD-10-CM | POA: Diagnosis not present

## 2018-08-30 DIAGNOSIS — Z6833 Body mass index (BMI) 33.0-33.9, adult: Secondary | ICD-10-CM | POA: Diagnosis not present

## 2018-09-17 ENCOUNTER — Other Ambulatory Visit: Payer: Self-pay

## 2018-09-17 ENCOUNTER — Ambulatory Visit
Admission: RE | Admit: 2018-09-17 | Discharge: 2018-09-17 | Disposition: A | Discharge status: Home / Self Care | Payer: Medicare Other | Source: Ambulatory Visit | Attending: Family Medicine | Admitting: Family Medicine

## 2018-09-17 ENCOUNTER — Encounter: Payer: Self-pay | Admitting: Family Medicine

## 2018-09-17 ENCOUNTER — Ambulatory Visit (INDEPENDENT_AMBULATORY_CARE_PROVIDER_SITE_OTHER): Payer: Medicare Other | Admitting: Family Medicine

## 2018-09-17 VITALS — BP 124/88 | HR 88 | Temp 96.9°F | Resp 18 | Ht 65.0 in | Wt 200.6 lb

## 2018-09-17 DIAGNOSIS — E1169 Type 2 diabetes mellitus with other specified complication: Secondary | ICD-10-CM | POA: Diagnosis not present

## 2018-09-17 DIAGNOSIS — Z0000 Encounter for general adult medical examination without abnormal findings: Secondary | ICD-10-CM

## 2018-09-17 DIAGNOSIS — E785 Hyperlipidemia, unspecified: Secondary | ICD-10-CM | POA: Diagnosis not present

## 2018-09-17 DIAGNOSIS — I10 Essential (primary) hypertension: Secondary | ICD-10-CM

## 2018-09-17 DIAGNOSIS — E1165 Type 2 diabetes mellitus with hyperglycemia: Secondary | ICD-10-CM | POA: Diagnosis not present

## 2018-09-17 DIAGNOSIS — E039 Hypothyroidism, unspecified: Secondary | ICD-10-CM

## 2018-09-17 DIAGNOSIS — Z1231 Encounter for screening mammogram for malignant neoplasm of breast: Secondary | ICD-10-CM

## 2018-09-17 DIAGNOSIS — Z23 Encounter for immunization: Secondary | ICD-10-CM | POA: Diagnosis not present

## 2018-09-17 NOTE — Progress Notes (Signed)
Subjective:     Michelle Mueller is a 53 y.o. female and is here for a comprehensive physical exam. The patient reports no new problems .  Social History   Socioeconomic History  . Marital status: Divorced    Spouse name: Not on file  . Number of children: Not on file  . Years of education: Not on file  . Highest education level: Not on file  Occupational History  . Not on file  Social Needs  . Financial resource strain: Not on file  . Food insecurity    Worry: Not on file    Inability: Not on file  . Transportation needs    Medical: Not on file    Non-medical: Not on file  Tobacco Use  . Smoking status: Never Smoker  . Smokeless tobacco: Never Used  Substance and Sexual Activity  . Alcohol use: No    Alcohol/week: 0.0 standard drinks  . Drug use: No  . Sexual activity: Not Currently    Birth control/protection: I.U.D., None  Lifestyle  . Physical activity    Days per week: Not on file    Minutes per session: Not on file  . Stress: Not on file  Relationships  . Social Herbalist on phone: Not on file    Gets together: Not on file    Attends religious service: Not on file    Active member of club or organization: Not on file    Attends meetings of clubs or organizations: Not on file    Relationship status: Not on file  . Intimate partner violence    Fear of current or ex partner: Not on file    Emotionally abused: Not on file    Physically abused: Not on file    Forced sexual activity: Not on file  Other Topics Concern  . Not on file  Social History Narrative  . Not on file   Health Maintenance  Topic Date Due  . TETANUS/TDAP  03/15/1986  . FOOT EXAM  11/10/2015  . COLONOSCOPY  03/15/2017  . HEMOGLOBIN A1C  07/12/2018  . INFLUENZA VACCINE  08/31/2018  . OPHTHALMOLOGY EXAM  06/17/2019  . MAMMOGRAM  09/17/2019  . PAP SMEAR-Modifier  07/02/2020  . PNEUMOCOCCAL POLYSACCHARIDE VACCINE AGE 51-64 HIGH RISK  Completed  . HIV Screening  Completed     The following portions of the patient's history were reviewed and updated as appropriate: She  has a past medical history of Anxiety, Arthritis, Asthma, Chicken pox, Depression, Diabetes mellitus without complication ([place]), Fibromyalgia, Goiter, Hypertension, Hypothyroidism, Ovarian cyst, and PONV (postoperative nausea and vomiting). She does not have any pertinent problems on file. She  has a past surgical history that includes Cholecystectomy; Cesarean section; Tonsillectomy; Hernia repair; Lipoma excision; Tumor excision; Anterior lumbar fusion (12/18/2011); and Abdominal exposure (12/18/2011). Her family history includes Cancer in her paternal grandfather; Colon cancer in her maternal aunt; Heart disease in her father; Lung cancer in her father; Pancreatic cancer in her maternal grandfather, maternal grandmother, and paternal grandmother. She  reports that she has never smoked. She has never used smokeless tobacco. She reports that she does not drink alcohol or use drugs. She has a current medication list which includes the following prescription(s): atorvastatin, bupropion, buspirone, calcium-magnesium-vitamin d, cetirizine, vitamin d, clotrimazole-betamethasone, cyclobenzaprine, estradiol, etanercept, fluconazole, gabapentin, glucose blood, lamotrigine, lamotrigine, levofloxacin, levomilnacipran hcl er, levomilnacipran hcl er, levonorgestrel, levothyroxine, lisinopril, lisinopril, metformin, multivitamin with minerals, mupirocin ointment, naproxen sodium, onetouch delica lancets 25Z, OVER THE  COUNTER MEDICATION, OVER THE COUNTER MEDICATION, oxycodone hcl, phenylephrine-acetaminophen, terconazole, tretinoin, escitalopram, and fluoxetine. Current Outpatient Medications on File Prior to Visit  Medication Sig Dispense Refill  . atorvastatin (LIPITOR) 10 MG tablet TAKE 1 TABLET(10 MG) BY MOUTH DAILY 90 tablet 1  . buPROPion (WELLBUTRIN XL) 300 MG 24 hr tablet Take one tablet by mouth daily. 90 tablet  0  . busPIRone (BUSPAR) 10 MG tablet TAKE 1 TABLET BY MOUTH THREE TIMES DAILY 180 tablet 0  . Calcium-Magnesium-Vitamin D (CALCIUM MAGNESIUM PO) Take 1 tablet by mouth daily.    . cetirizine (ZYRTEC) 5 MG tablet Take 5 mg by mouth daily.    . Cholecalciferol (VITAMIN D) 2000 units CAPS Take 1 capsule by mouth daily.    . clotrimazole-betamethasone (LOTRISONE) cream Apply 1 application topically 2 (two) times daily. 30 g 0  . cyclobenzaprine (FLEXERIL) 5 MG tablet   1  . estradiol (CLIMARA - DOSED IN MG/24 HR) 0.05 mg/24hr patch Place patch onto skin twice weekly 8 patch 5  . etanercept (ENBREL) 50 MG/ML injection Inject into the skin.    . fluconazole (DIFLUCAN) 150 MG tablet 1 po x1, may repeat in 3 days prn 2 tablet 0  . gabapentin (NEURONTIN) 300 MG capsule Take 3 capsules by mouth at bedtime.     [name] glucose blood (ONE TOUCH ULTRA TEST) test strip Test blood sugar once daily. Dx code: 250.00 100 each 12  . lamoTRIgine (LAMICTAL) 100 MG tablet Take 1 tablet (100 mg total) by mouth every morning. This is in addition to 200mg . Total dose now 300mg  per day 90 tablet 0  . lamoTRIgine (LAMICTAL) 200 MG tablet Take one tablet at bedtime. 90 tablet 0  . levofloxacin (LEVAQUIN) 500 MG tablet Take 1 tablet (500 mg total) by mouth daily. 7 tablet 0  . Levomilnacipran HCl ER 20 MG CP24 Take 20 mg by mouth daily. 90 capsule 0  . Levomilnacipran HCl ER 40 MG CP24 Take 40 mg by mouth daily. 90 capsule 0  . levonorgestrel (MIRENA) 20 MCG/24HR IUD 1 each by Intrauterine route once.    [name] levothyroxine (SYNTHROID) 50 MCG tablet Take 1 tablet (50 mcg total) by mouth daily before breakfast. 90 tablet 1  . lisinopril (PRINIVIL,ZESTRIL) 5 MG tablet Take 1 tablet (5 mg total) by mouth daily. 90 tablet 0  . lisinopril (ZESTRIL) 5 MG tablet TAKE 1 TABLET BY MOUTH EVERY DAY 90 tablet 1  . metFORMIN (GLUCOPHAGE-XR) 500 MG 24 hr tablet TAKE 1 TABLET BY MOUTH EVERY EVENING 90 tablet 0  . Multiple Vitamin (MULTIVITAMIN  WITH MINERALS) TABS Take 1 tablet by mouth daily.    . mupirocin ointment (BACTROBAN) 2 % APPLY TO LESION ON LEFT BREAST TWICE DAILY FOR 14 DAYS  0  . naproxen sodium (ANAPROX) 220 MG tablet Take 220 mg by mouth.    Glory Rosebush DELICA LANCETS 25D MISC Test blood sugar once daily. Dx code: 250.00 100 each 12  . OVER THE COUNTER MEDICATION Vitamin B-12 3000 mcg 1 table daily.    [name] OVER THE COUNTER MEDICATION Amberen 2 tablets every morning    . oxyCODONE 10 MG TABS Take 1 tablet (10 mg total) by mouth every 6 (six) hours as needed (for pain). 60 tablet 0  . Phenylephrine-Acetaminophen (TYLENOL SINUS CONGESTION/PAIN PO) Take 2 tablets by mouth daily as needed (for sinus congestion).     [name] terconazole (TERAZOL 7) 0.4 % vaginal cream Place 1 applicator vaginally at bedtime. Use for seven days 45  g 0  . tretinoin (RETIN-A) 0.025 % gel Apply 3 nights weekly x 2 weeks can increase to nightly only as tolerated    . [DISCONTINUED] escitalopram (LEXAPRO) 10 MG tablet TAKE 1 AND 1/2 TABLETS(15 MG) BY MOUTH DAILY 45 tablet 0  . [DISCONTINUED] FLUoxetine (PROZAC) 20 MG tablet Take 1 tablet (20 mg total) by mouth daily. 90 tablet 0   No current facility-administered medications on file prior to visit.    She is allergic to erythromycin; zocor  [simvastatin-high dose]; doxycycline; and peroxide [hydrogen peroxide]..  Review of Systems Review of Systems  Constitutional: Negative for activity change, appetite change and fatigue.  HENT: Negative for hearing loss, congestion, tinnitus and ear discharge.  dentist q10m Eyes: Negative for visual disturbance (see optho q1y -- vision corrected to 20/20 with glasses).  Respiratory: Negative for cough, chest tightness and shortness of breath.   Cardiovascular: Negative for chest pain, palpitations and leg swelling.  Gastrointestinal: Negative for abdominal pain, diarrhea, constipation and abdominal distention.  Genitourinary: Negative for urgency, frequency, decreased  urine volume and difficulty urinating.  Musculoskeletal: Negative for back pain, arthralgias and gait problem.  Skin: Negative for color change, pallor and rash.  Neurological: Negative for dizziness, light-headedness, numbness and headaches.  Hematological: Negative for adenopathy. Does not bruise/bleed easily.  Psychiatric/Behavioral: Negative for suicidal ideas, confusion, sleep disturbance, self-injury, dysphoric mood, decreased concentration and agitation.       Objective:    BP 124/88 (BP Location: Right Arm, Patient Position: Sitting, Cuff Size: Normal)   Pulse 88   Temp (!) 96.9 F (36.1 C) (Temporal)   Resp 18   Ht 5\' 5"  (1.651 m)   Wt 200 lb 9.6 oz (91 kg)   SpO2 98%   BMI 33.38 kg/m  General appearance: alert, cooperative, appears stated age and no distress Head: Normocephalic, without obvious abnormality, atraumatic Eyes: negative findings: lids and lashes normal, conjunctivae and sclerae normal and pupils equal, round, reactive to light and accomodation Ears: normal TM's and external ear canals both ears Nose: Nares normal. Septum midline. Mucosa normal. No drainage or sinus tenderness. Throat: lips, mucosa, and tongue normal; teeth and gums normal Neck: no adenopathy, no carotid bruit, no JVD, supple, symmetrical, trachea midline and thyroid not enlarged, symmetric, no tenderness/mass/nodules Back: symmetric, no curvature. ROM normal. No CVA tenderness. Lungs: clear to auscultation bilaterally Breasts: normal appearance, no masses or tenderness Heart: regular rate and rhythm, S1, S2 normal, no murmur, click, rub or gallop Abdomen: soft, non-tender; bowel sounds normal; no masses,  no organomegaly Pelvic: deferred Extremities: extremities normal, atraumatic, no cyanosis or edema Pulses: 2+ and symmetric Skin: Skin color, texture, turgor normal. No rashes or lesions Lymph nodes: Cervical, supraclavicular, and axillary nodes normal. Neurologic: Alert and oriented X  3, normal strength and tone. Normal symmetric reflexes. Normal coordination and gait    Assessment:    Healthy female exam.      Plan:    ghm utd Check labs See After Visit Summary for Counseling Recommendations    1. Essential hypertension Well controlled, no changes to meds. Encouraged heart healthy diet such as the DASH diet and exercise as tolerated.  - Lipid panel; Future - CBC with Differential/Platelet; Future - TSH; Future - Comprehensive metabolic panel; Future - Hemoglobin A1c; Future  2. Uncontrolled type 2 diabetes mellitus with hyperglycemia ([place]) hgba1c to be checked , minimize simple carbs. Increase exercise as tolerated. Continue current meds  - Comprehensive metabolic panel; Future - Hemoglobin A1c; Future  3. Hyperlipidemia  associated with type 2 diabetes mellitus ([place]) Encouraged heart healthy diet, increase exercise, avoid trans fats, consider a krill oil cap daily - Lipid panel; Future - Comprehensive metabolic panel; Future  4. Hypothyroidism, unspecified type Check labs  stable - TSH; Future  5. Preventative health care See above Flu shot given  - Lipid panel; Future - CBC with Differential/Platelet; Future - TSH; Future - Comprehensive metabolic panel; Future - Hemoglobin A1c; Future  6. Need for influenza vaccination   - Flu Vaccine QUAD 6+ mos PF IM (Fluarix Quad PF)

## 2018-09-17 NOTE — Patient Instructions (Signed)

## 2018-09-18 ENCOUNTER — Encounter (HOSPITAL_COMMUNITY): Payer: Self-pay | Admitting: Psychiatry

## 2018-09-18 ENCOUNTER — Other Ambulatory Visit: Payer: Self-pay | Admitting: Family Medicine

## 2018-09-18 ENCOUNTER — Ambulatory Visit (INDEPENDENT_AMBULATORY_CARE_PROVIDER_SITE_OTHER): Payer: Medicare Other | Admitting: Psychiatry

## 2018-09-18 ENCOUNTER — Other Ambulatory Visit (INDEPENDENT_AMBULATORY_CARE_PROVIDER_SITE_OTHER): Payer: Medicare Other

## 2018-09-18 DIAGNOSIS — G8929 Other chronic pain: Secondary | ICD-10-CM

## 2018-09-18 DIAGNOSIS — M0609 Rheumatoid arthritis without rheumatoid factor, multiple sites: Secondary | ICD-10-CM | POA: Diagnosis not present

## 2018-09-18 DIAGNOSIS — M961 Postlaminectomy syndrome, not elsewhere classified: Secondary | ICD-10-CM | POA: Diagnosis not present

## 2018-09-18 DIAGNOSIS — I10 Essential (primary) hypertension: Secondary | ICD-10-CM | POA: Diagnosis not present

## 2018-09-18 DIAGNOSIS — F411 Generalized anxiety disorder: Secondary | ICD-10-CM | POA: Diagnosis not present

## 2018-09-18 DIAGNOSIS — F331 Major depressive disorder, recurrent, moderate: Secondary | ICD-10-CM

## 2018-09-18 DIAGNOSIS — E039 Hypothyroidism, unspecified: Secondary | ICD-10-CM | POA: Diagnosis not present

## 2018-09-18 DIAGNOSIS — M549 Dorsalgia, unspecified: Secondary | ICD-10-CM

## 2018-09-18 DIAGNOSIS — E785 Hyperlipidemia, unspecified: Secondary | ICD-10-CM

## 2018-09-18 DIAGNOSIS — R928 Other abnormal and inconclusive findings on diagnostic imaging of breast: Secondary | ICD-10-CM

## 2018-09-18 DIAGNOSIS — N6320 Unspecified lump in the left breast, unspecified quadrant: Secondary | ICD-10-CM

## 2018-09-18 DIAGNOSIS — E1165 Type 2 diabetes mellitus with hyperglycemia: Secondary | ICD-10-CM

## 2018-09-18 DIAGNOSIS — E1169 Type 2 diabetes mellitus with other specified complication: Secondary | ICD-10-CM

## 2018-09-18 DIAGNOSIS — Z0000 Encounter for general adult medical examination without abnormal findings: Secondary | ICD-10-CM

## 2018-09-18 DIAGNOSIS — G5603 Carpal tunnel syndrome, bilateral upper limbs: Secondary | ICD-10-CM | POA: Diagnosis not present

## 2018-09-18 DIAGNOSIS — M0600 Rheumatoid arthritis without rheumatoid factor, unspecified site: Secondary | ICD-10-CM | POA: Diagnosis not present

## 2018-09-18 LAB — COMPREHENSIVE METABOLIC PANEL
ALT: 31 U/L (ref 0–35)
AST: 40 U/L — ABNORMAL HIGH (ref 0–37)
Albumin: 4.2 g/dL (ref 3.5–5.2)
Alkaline Phosphatase: 65 U/L (ref 39–117)
BUN: 10 mg/dL (ref 6–23)
CO2: 29 mEq/L (ref 19–32)
Calcium: 8.8 mg/dL (ref 8.4–10.5)
Chloride: 102 mEq/L (ref 96–112)
Creatinine, Ser: 0.98 mg/dL (ref 0.40–1.20)
GFR: 59.71 mL/min — ABNORMAL LOW (ref >60.00)
Glucose, Bld: 87 mg/dL (ref 70–99)
Potassium: 3.5 mEq/L (ref 3.5–5.1)
Sodium: 139 mEq/L (ref 135–145)
Total Bilirubin: 0.7 mg/dL (ref 0.2–1.2)
Total Protein: 6.6 g/dL (ref 6.0–8.3)

## 2018-09-18 LAB — LIPID PANEL
Cholesterol: 146 mg/dL (ref 0–200)
HDL: 67.3 mg/dL (ref >39.00)
LDL Cholesterol: 62 mg/dL (ref 0–99)
NonHDL: 78.58
Total CHOL/HDL Ratio: 2
Triglycerides: 82 mg/dL (ref 0.0–149.0)
VLDL: 16.4 mg/dL (ref 0.0–40.0)

## 2018-09-18 LAB — CBC WITH DIFFERENTIAL/PLATELET
Basophils Absolute: 0.1 10*3/uL (ref 0.0–0.1)
Basophils Relative: 0.8 % (ref 0.0–3.0)
Eosinophils Absolute: 0.2 10*3/uL (ref 0.0–0.7)
Eosinophils Relative: 2 % (ref 0.0–5.0)
HCT: 39.7 % (ref 36.0–46.0)
Hemoglobin: 13.4 g/dL (ref 12.0–15.0)
Lymphocytes Relative: 34.2 % (ref 12.0–46.0)
Lymphs Abs: 2.7 10*3/uL (ref 0.7–4.0)
MCHC: 33.7 g/dL (ref 30.0–36.0)
MCV: 86.5 fl (ref 78.0–100.0)
Monocytes Absolute: 0.8 10*3/uL (ref 0.1–1.0)
Monocytes Relative: 9.9 % (ref 3.0–12.0)
Neutro Abs: 4.1 10*3/uL (ref 1.4–7.7)
Neutrophils Relative %: 53.1 % (ref 43.0–77.0)
Platelets: 271 10*3/uL (ref 150.0–400.0)
RBC: 4.59 Mil/uL (ref 3.87–5.11)
RDW: 14 % (ref 11.5–15.5)
WBC: 7.8 10*3/uL (ref 4.0–10.5)

## 2018-09-18 LAB — TSH: TSH: 1.67 u[IU]/mL (ref 0.35–4.50)

## 2018-09-18 LAB — HEMOGLOBIN A1C: Hgb A1c MFr Bld: 5.1 % (ref 4.6–6.5)

## 2018-09-18 MED ORDER — LAMOTRIGINE 200 MG PO TABS: ORAL_TABLET | ORAL | 0 refills | Status: DC

## 2018-09-18 MED ORDER — LEVOMILNACIPRAN HCL ER 40 MG PO CP24: 40.0000 mg | ORAL_CAPSULE | Freq: Every day | ORAL | 0 refills | Status: DC

## 2018-09-18 MED ORDER — LAMOTRIGINE 100 MG PO TABS: 100.0000 mg | ORAL_TABLET | Freq: Every morning | ORAL | 0 refills | Status: DC

## 2018-09-18 NOTE — Progress Notes (Signed)
Patient ID: Michelle Mueller, female   DOB: 09/10/1965, 53 y.o.   MRN: [id number]  Psychiatric Outpatient Follow up Visit  Patient Identification: Michelle Mueller MRN:  [id number] Date of Evaluation:  09/18/2018 Referral Source: [name] Chief Complaint:    depression . Follow up Visit Diagnosis: MDD. GAD . Mood related to Chronic back pain Diagnosis:   Patient Active Problem List   Diagnosis Date Noted  . Influenza [J11.1] 02/28/2018  . Pansinusitis [J32.4] 02/28/2018  . Rash [R21] 01/13/2018  . Hypothyroidism [E03.9] 01/13/2018  . Hyperlipidemia associated with type 2 diabetes mellitus ([place]) [E11.69, E78.5] 07/30/2016  . Rheumatoid arthritis of multiple sites with negative rheumatoid factor ([place]) [M06.09] 03/13/2016  . Chest pain of uncertain etiology [Z66.29] 12/21/2014  . Essential hypertension [I10] 12/21/2014  . Myalgia [M79.10] 11/10/2014  . Insomnia [G47.00] 11/10/2014  . UTI (urinary tract infection) [N39.0] 08/04/2013  . Fibromyalgia muscle pain [M79.7] 06/13/2013  . Severe depression ([place]) [F32.2] 06/13/2013  . 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, without long-term current use of insulin ([place]) [E11.620] 06/13/2013  . Rheumatoid arthritis ([place]) [M06.9] 03/19/2013  . Fibromyalgia [M79.7] 03/19/2013  . Unspecified hypothyroidism [E03.9] 03/19/2013  . Goiter [E04.9] 03/19/2013  . Obesity (BMI 30-39.9) [E66.9] 03/19/2013  . 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 Family history of pancreatic cancer [Z80.0] 01/18/2011  . Chronic diarrhea [K52.9] 01/18/2011    I connected with Michelle Mueller on 09/18/18 at  8:30 AM EDT by a video enabled telemedicine application and verified that I am speaking with the correct person using two identifiers.  I discussed the limitations of evaluation and management by telemedicine and the availability of in person appointments. The patient expressed understanding and agreed to  proceed.   Fetzima was increased last visit to 40mg , it has helped, she had failed prozac and other meds in the past. Pain effects mood as well  She continues with lamictal, wellbutrin fetzima is expensive, states to need patient assistance program  Not crying but running low on fetzima and getting worried    no rash BuSpar helps anxiety but it fluctuates Son goes to college but now Holiday representative,  Modifying factor: kids Severity of depression:subdued Timing:morning and when in pain  associated symptoms: back pain  Past Medical History:  Past Medical History:  Diagnosis Date  . Anxiety   . Arthritis   . Asthma    last attack 3 yrs ago  . Chicken pox   . Depression   . Diabetes mellitus without complication ([place])   . Fibromyalgia   . Goiter   . Hypertension    [name]  . Hypothyroidism   . Ovarian cyst   . PONV (postoperative nausea and vomiting)     Past Surgical History:  Procedure Laterality Date  . ABDOMINAL EXPOSURE  12/18/2011   Procedure: ABDOMINAL EXPOSURE;  Surgeon: [name], MD;  Location: [facility];  Service: Vascular;  Laterality: N/A;  Anterior Expossure for anterior lumbar interbody fuson  . ANTERIOR LUMBAR FUSION  12/18/2011   Procedure: ANTERIOR LUMBAR FUSION 1 LEVEL;  Surgeon: [name], MD;  Location: [facility];  Service: Neurosurgery;  Laterality: N/A;  Lumbar four-five Anterior Lumbar Interbody Fusion with Dr. [name] to do anterior exposure  . CESAREAN SECTION    . CHOLECYSTECTOMY    . HERNIA REPAIR    . LIPOMA EXCISION     L breast  . TONSILLECTOMY    . TUMOR EXCISION  Nerve sheath tumor, R arm   Family History:  Family History  Problem Relation Age of Onset  . Heart disease Father   . Lung cancer Father   . Colon cancer Maternal Aunt   . Pancreatic cancer Maternal Grandmother   . Pancreatic cancer Maternal Grandfather   . Pancreatic cancer Paternal Grandmother   . Cancer Paternal Grandfather   . Sexual abuse Neg Hx     Social History:   Social History   Socioeconomic History  . Marital status: Divorced    Spouse name: Not on file  . Number of children: Not on file  . Years of education: Not on file  . Highest education level: Not on file  Occupational History  . Not on file  Social Needs  . Financial resource strain: Not on file  . Food insecurity    Worry: Not on file    Inability: Not on file  . Transportation needs    Medical: Not on file    Non-medical: Not on file  Tobacco Use  . Smoking status: Never Smoker  . Smokeless tobacco: Never Used  Substance and Sexual Activity  . Alcohol use: No    Alcohol/week: 0.0 standard drinks  . Drug use: No  . Sexual activity: Not Currently    Birth control/protection: I.U.D., None  Lifestyle  . Physical activity    Days per week: Not on file    Minutes per session: Not on file  . Stress: Not on file  Relationships  . Social Herbalist on phone: Not on file    Gets together: Not on file    Attends religious service: Not on file    Active member of club or organization: Not on file    Attends meetings of clubs or organizations: Not on file    Relationship status: Not on file  Other Topics Concern  . Not on file  Social History Narrative  . Not on file      Psychiatric Specialty Exam:    Depression        Associated symptoms include does not have insomnia and no suicidal ideas.   Review of Systems  Constitutional: Negative for fever.  Cardiovascular: Negative for chest pain and palpitations.  Musculoskeletal: Positive for joint pain.  Skin: Negative for rash.  Neurological: Negative for tremors.  Psychiatric/Behavioral: Positive for depression. Negative for substance abuse and suicidal ideas. The patient does not have insomnia.     There were no vitals taken for this visit.There is no height or weight on file to calculate BMI.  General Appearance: fair  Eye Contact:  Good  Speech:  Clear and Coherent  Volume:   Normal  Mood: somewhat subdued  Affect: congruent  Thought Process:  Coherent, Goal Directed, Intact, Linear and Logical  Orientation:  Full (Time, Place, and Person)  Thought Content:  WDL  Suicidal Thoughts:  No  Homicidal Thoughts:  No  Memory:  Immediate;   Good Recent;   Good Remote;   Good  Judgement:  Good  Insight:  Present  Psychomotor Activity:  Decreased  Concentration:  Fair  Recall:  Good  Fund of Knowledge:Good  Language: Good  Akathisia:  No  Handed:  Left  AIMS (if indicated):    Assets:  Communication Skills Desire for Improvement Financial Resources/Insurance Housing Resilience Transportation  ADL's:  Intact  Cognition: WNL  Sleep:  poor    Allergies:   Allergies  Allergen Reactions  . Erythromycin Other (See Comments)  arrhythmia  . Zocor  [Simvastatin-High Dose] Rash  . Doxycycline Other (See Comments)    Decreased BP and caused dizziness per patient  . Peroxide [Hydrogen Peroxide]    Current Medications: Current Outpatient Medications  Medication Sig Dispense Refill  . atorvastatin (LIPITOR) 10 MG tablet TAKE 1 TABLET(10 MG) BY MOUTH DAILY 90 tablet 1  . buPROPion (WELLBUTRIN XL) 300 MG 24 hr tablet Take one tablet by mouth daily. 90 tablet 0  . busPIRone (BUSPAR) 10 MG tablet TAKE 1 TABLET BY MOUTH THREE TIMES DAILY 180 tablet 0  . Calcium-Magnesium-Vitamin D (CALCIUM MAGNESIUM PO) Take 1 tablet by mouth daily.    . cetirizine (ZYRTEC) 5 MG tablet Take 5 mg by mouth daily.    . Cholecalciferol (VITAMIN D) 2000 units CAPS Take 1 capsule by mouth daily.    . clotrimazole-betamethasone (LOTRISONE) cream Apply 1 application topically 2 (two) times daily. 30 g 0  . cyclobenzaprine (FLEXERIL) 5 MG tablet   1  . estradiol (CLIMARA - DOSED IN MG/24 HR) 0.05 mg/24hr patch Place patch onto skin twice weekly 8 patch 5  . etanercept (ENBREL) 50 MG/ML injection Inject into the skin.    . fluconazole (DIFLUCAN) 150 MG tablet 1 po x1, may repeat in 3  days prn 2 tablet 0  . gabapentin (NEURONTIN) 300 MG capsule Take 3 capsules by mouth at bedtime.     [name] glucose blood (ONE TOUCH ULTRA TEST) test strip Test blood sugar once daily. Dx code: 250.00 100 each 12  . lamoTRIgine (LAMICTAL) 100 MG tablet Take 1 tablet (100 mg total) by mouth every morning. This is in addition to 200mg . Total dose now 300mg  per day 90 tablet 0  . lamoTRIgine (LAMICTAL) 200 MG tablet Take one tablet at bedtime. 90 tablet 0  . levofloxacin (LEVAQUIN) 500 MG tablet Take 1 tablet (500 mg total) by mouth daily. 7 tablet 0  . Levomilnacipran HCl ER 40 MG CP24 Take 40 mg by mouth daily. 90 capsule 0  . levonorgestrel (MIRENA) 20 MCG/24HR IUD 1 each by Intrauterine route once.    [name] levothyroxine (SYNTHROID) 50 MCG tablet Take 1 tablet (50 mcg total) by mouth daily before breakfast. 90 tablet 1  . lisinopril (PRINIVIL,ZESTRIL) 5 MG tablet Take 1 tablet (5 mg total) by mouth daily. 90 tablet 0  . lisinopril (ZESTRIL) 5 MG tablet TAKE 1 TABLET BY MOUTH EVERY DAY 90 tablet 1  . metFORMIN (GLUCOPHAGE-XR) 500 MG 24 hr tablet TAKE 1 TABLET BY MOUTH EVERY EVENING 90 tablet 0  . Multiple Vitamin (MULTIVITAMIN WITH MINERALS) TABS Take 1 tablet by mouth daily.    . mupirocin ointment (BACTROBAN) 2 % APPLY TO LESION ON LEFT BREAST TWICE DAILY FOR 14 DAYS  0  . naproxen sodium (ANAPROX) 220 MG tablet Take 220 mg by mouth.    Glory Rosebush DELICA LANCETS 16X MISC Test blood sugar once daily. Dx code: 250.00 100 each 12  . OVER THE COUNTER MEDICATION Vitamin B-12 3000 mcg 1 table daily.    [name] OVER THE COUNTER MEDICATION Amberen 2 tablets every morning    . oxyCODONE 10 MG TABS Take 1 tablet (10 mg total) by mouth every 6 (six) hours as needed (for pain). 60 tablet 0  . Phenylephrine-Acetaminophen (TYLENOL SINUS CONGESTION/PAIN PO) Take 2 tablets by mouth daily as needed (for sinus congestion).     [name] terconazole (TERAZOL 7) 0.4 % vaginal cream Place 1 applicator vaginally at bedtime. Use for seven  days 45 g 0  .  tretinoin (RETIN-A) 0.025 % gel Apply 3 nights weekly x 2 weeks can increase to nightly only as tolerated     No current facility-administered medications for this visit.      Treatment Plan Summary: Medication management  1. Depression:: subdued, continue fetzima, it is helping, will need patient assistance program to afford it Continue lamictal. No rash  continue wellbutrin Also recommend CBT or DBT for recurrent depression and mood symptoms coping skills 2. Anxiety fluctautes, continue buspar, and fetzima 3. Insomnia:manageable, avoid daytime napes 4. Back pain:chronic. Working with providers and symptomatic management. FU for RA    Add more Me time and activitiy time to distract from dysporia  I discussed the assessment and treatment plan with the patient. The patient was provided an opportunity to ask questions and all were answered. The patient agreed with the plan and demonstrated an understanding of the instructions.   The patient was advised to call back or seek an in-person evaluation if the symptoms worsen or if the condition fails to improve as anticipated. FU patient will call for fu  Merian Capron, MD  8:41 AM 09/18/2018

## 2018-09-19 ENCOUNTER — Telehealth: Payer: Self-pay | Admitting: Family Medicine

## 2018-09-19 NOTE — Telephone Encounter (Signed)
Pt called and stated that she has a mammogram done and they found a lump. Pt would like a call back fome the nurse regarding. Please advise

## 2018-09-19 NOTE — Telephone Encounter (Signed)
Yes -- the breast center normally sets all that up I see they put the order in already

## 2018-09-19 NOTE — Telephone Encounter (Signed)
She'll need a breast Bx correct? They probably already told her the next steps? Please advise

## 2018-09-20 ENCOUNTER — Encounter: Payer: Self-pay | Admitting: Family Medicine

## 2018-09-20 ENCOUNTER — Other Ambulatory Visit: Payer: Self-pay | Admitting: Obstetrics & Gynecology

## 2018-09-20 NOTE — Telephone Encounter (Signed)
Spoke with patient.

## 2018-09-20 NOTE — Telephone Encounter (Signed)
1 mammogram---  It just said L breast --- she would have to discuss it further with the breast center--- it does not say exactly where on the report--- if she can not see it in My chart we can mail her report  2. Liver function--- i'm not too concerned because is it only slightly elevated and it may be the ra drugs  3.  Im pretty sure most of her symptoms are related to the ra or the drugs--- has she discussed it with her rheum

## 2018-09-24 ENCOUNTER — Encounter: Payer: Self-pay | Admitting: *Deleted

## 2018-09-26 DIAGNOSIS — R12 Heartburn: Secondary | ICD-10-CM | POA: Diagnosis not present

## 2018-09-26 DIAGNOSIS — Z1211 Encounter for screening for malignant neoplasm of colon: Secondary | ICD-10-CM | POA: Diagnosis not present

## 2018-09-26 DIAGNOSIS — R1013 Epigastric pain: Secondary | ICD-10-CM | POA: Diagnosis not present

## 2018-09-26 DIAGNOSIS — Z01812 Encounter for preprocedural laboratory examination: Secondary | ICD-10-CM | POA: Diagnosis not present

## 2018-09-27 ENCOUNTER — Ambulatory Visit
Admission: RE | Admit: 2018-09-27 | Discharge: 2018-09-27 | Disposition: A | Discharge status: Home / Self Care | Payer: Medicare Other | Source: Ambulatory Visit | Attending: Family Medicine | Admitting: Family Medicine

## 2018-09-27 ENCOUNTER — Other Ambulatory Visit: Payer: Self-pay

## 2018-09-27 DIAGNOSIS — N6002 Solitary cyst of left breast: Secondary | ICD-10-CM | POA: Diagnosis not present

## 2018-09-27 DIAGNOSIS — R928 Other abnormal and inconclusive findings on diagnostic imaging of breast: Secondary | ICD-10-CM

## 2018-10-09 ENCOUNTER — Other Ambulatory Visit: Payer: Self-pay | Admitting: Family Medicine

## 2018-10-09 DIAGNOSIS — IMO0001 Reserved for inherently not codable concepts without codable children: Secondary | ICD-10-CM

## 2018-10-09 DIAGNOSIS — E119 Type 2 diabetes mellitus without complications: Secondary | ICD-10-CM

## 2018-10-17 DIAGNOSIS — Z8639 Personal history of other endocrine, nutritional and metabolic disease: Secondary | ICD-10-CM | POA: Diagnosis not present

## 2018-10-17 DIAGNOSIS — M0609 Rheumatoid arthritis without rheumatoid factor, multiple sites: Secondary | ICD-10-CM | POA: Diagnosis not present

## 2018-10-17 DIAGNOSIS — R7989 Other specified abnormal findings of blood chemistry: Secondary | ICD-10-CM | POA: Diagnosis not present

## 2018-10-22 ENCOUNTER — Telehealth: Payer: Self-pay

## 2018-10-22 DIAGNOSIS — N951 Menopausal and female climacteric states: Secondary | ICD-10-CM

## 2018-10-22 MED ORDER — ESTRADIOL 0.05 MG/24HR TD PTWK: 0.0500 mg | MEDICATED_PATCH | TRANSDERMAL | 3 refills | Status: DC

## 2018-10-22 NOTE — Telephone Encounter (Signed)
Pt called requesting Rx for Estradiol be sent as a 90 day supply. Rx fixed. Pt is aware she needs to schedule annual appt.

## 2018-10-28 ENCOUNTER — Telehealth (HOSPITAL_COMMUNITY): Payer: Self-pay | Admitting: Psychiatry

## 2018-10-28 NOTE — Telephone Encounter (Signed)
Pt just heard back from the [facility] assistance. She was denied. She still cannot afford it.  What else can you write instead of this. Please advise.   CB # [phone]

## 2018-10-28 NOTE — Telephone Encounter (Signed)
Michelle Mueller can you call her.  If ok we can just continue lamictal, wellbutrin for now. She has been on different meds before Other choice is to add small dose of effexor 37.5mg  bid

## 2018-10-29 ENCOUNTER — Other Ambulatory Visit (HOSPITAL_COMMUNITY): Payer: Self-pay

## 2018-10-29 MED ORDER — VENLAFAXINE HCL 37.5 MG PO TABS: 37.5000 mg | ORAL_TABLET | Freq: Two times a day (BID) | ORAL | 0 refills | Status: DC

## 2018-10-29 NOTE — Telephone Encounter (Signed)
Spoke with patient and she will call back and let us know if she wants to start the effexor after she checks the prices.

## 2018-10-29 NOTE — Telephone Encounter (Signed)
Patient called back and stated she would like to try Effexor 37.5mg  BID. I sent in a 30 day supply per patient request.

## 2018-11-06 ENCOUNTER — Telehealth (HOSPITAL_COMMUNITY): Payer: Self-pay | Admitting: Psychiatry

## 2018-11-06 MED ORDER — BUSPIRONE HCL 10 MG PO TABS: ORAL_TABLET | ORAL | 0 refills | Status: DC

## 2018-11-06 NOTE — Telephone Encounter (Signed)
Pt needs refill on buspar sent to [facility] on [address]- 90day supply

## 2018-11-06 NOTE — Telephone Encounter (Signed)
sent 

## 2018-11-21 ENCOUNTER — Telehealth: Payer: Self-pay

## 2018-11-21 NOTE — Telephone Encounter (Signed)
Copied from [place] [id number]. Topic: General - Other >> Nov 21, 2018 12:19 PM Sheran Luz wrote: Patient requesting call back from PA to discuss a coding issue for DOS 8/18. Patient has already contacted billing. She is requesting call back as soon as possible.

## 2018-11-22 IMAGING — MG 2D DIGITAL DIAGNOSTIC UNILATERAL LEFT MAMMOGRAM WITH CAD AND ADJ
6 series · 7 of 14 positions shown · non-contrast
Comparison: Previous exams including diagnostic mammogram and
ultrasound dated 12/20/2015.

CLINICAL DATA: Six-month follow-up for probably benign finding in
the left breast.

[L MLO]
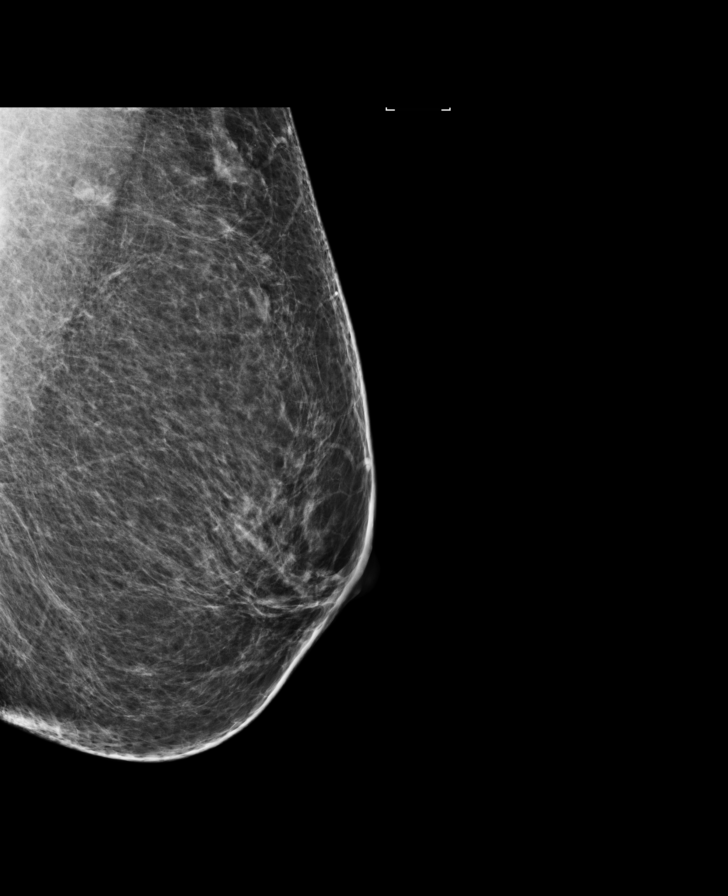

[L CC]
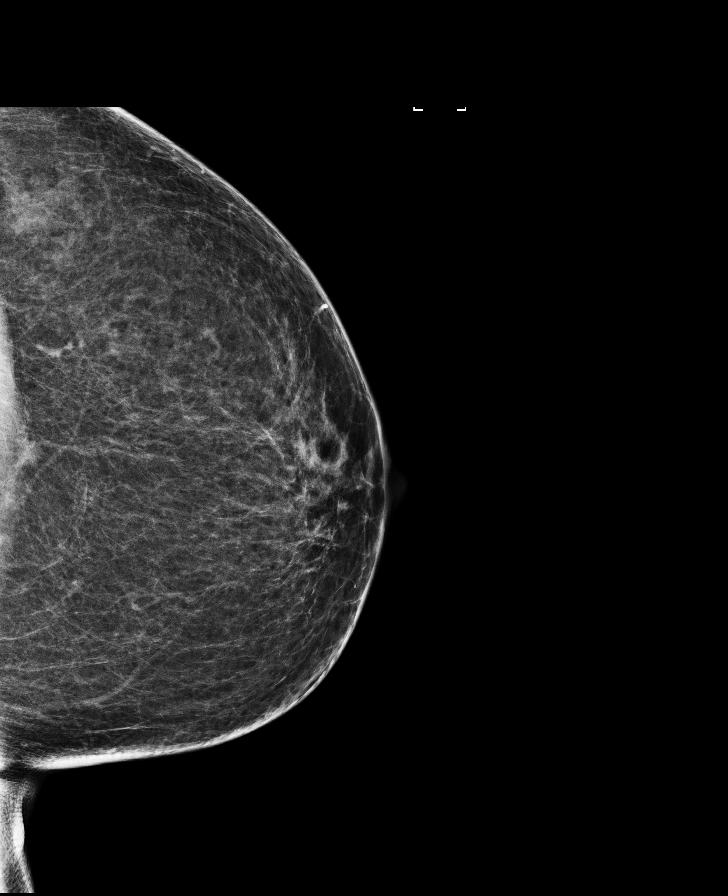

[L CC synth-2D]
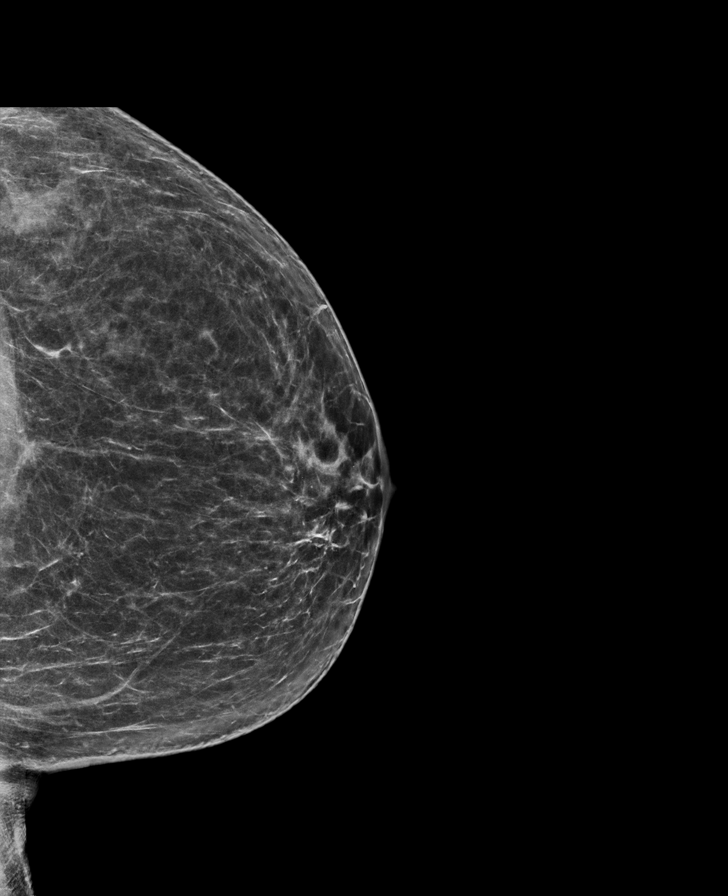

[L MLO synth-2D]
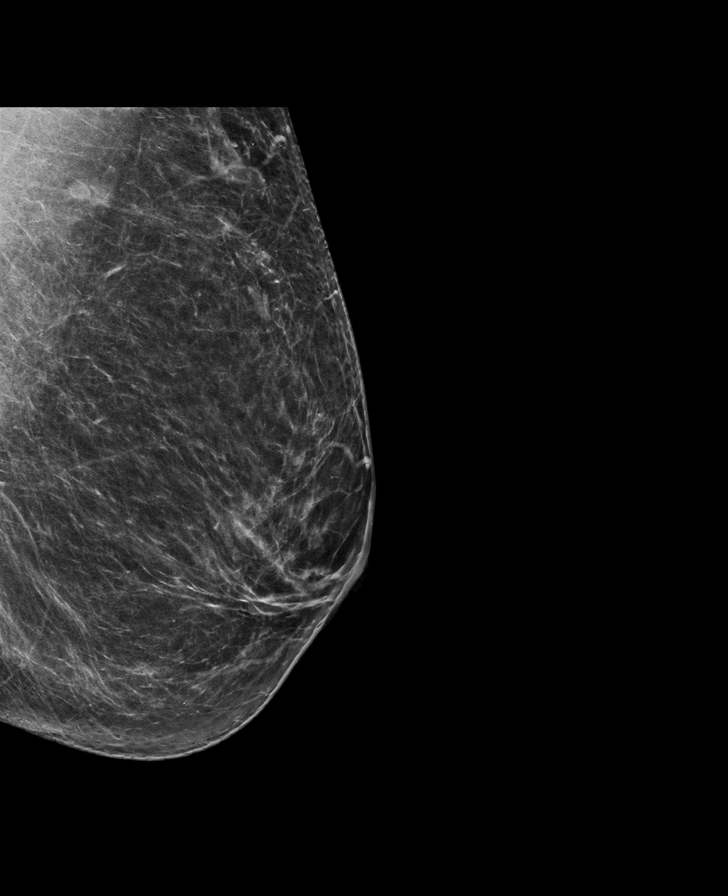

[L CC tomo · 2 of 75 frames shown]
[frame 25/75]
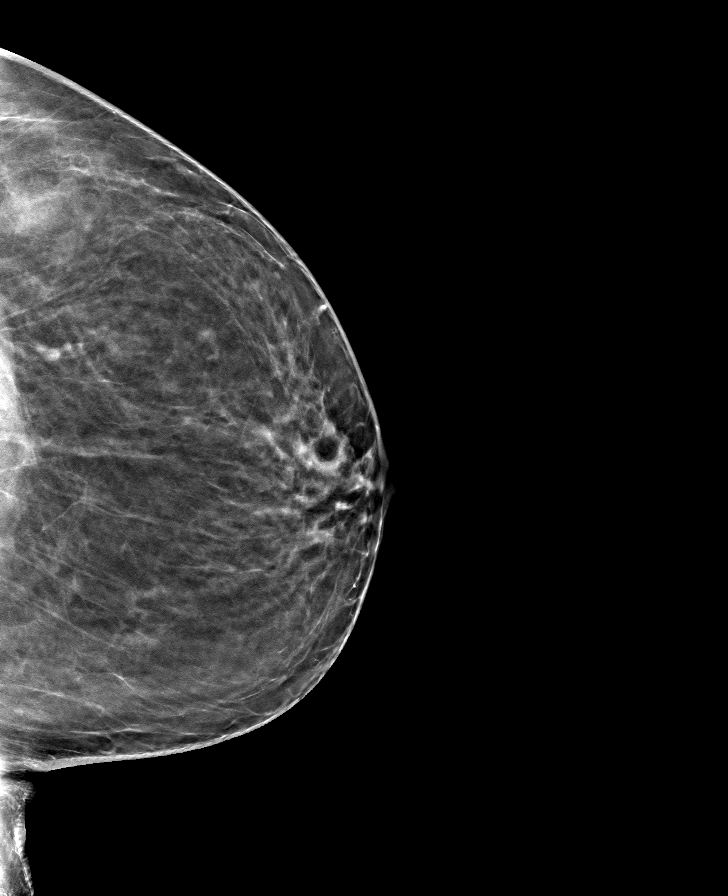
[frame 38/75]
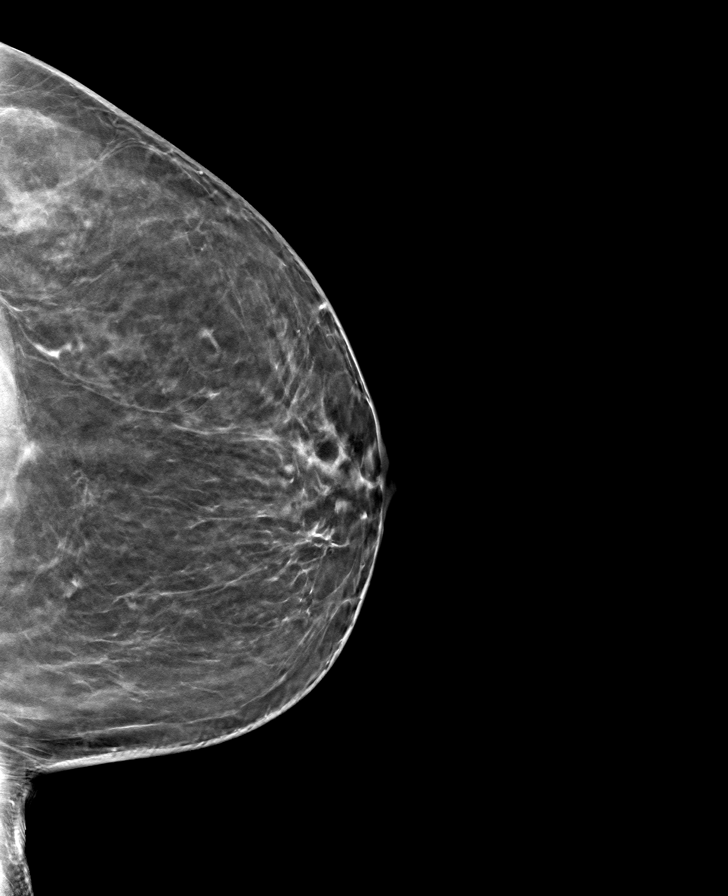

[L MLO tomo · tomo slice 38/75.0]
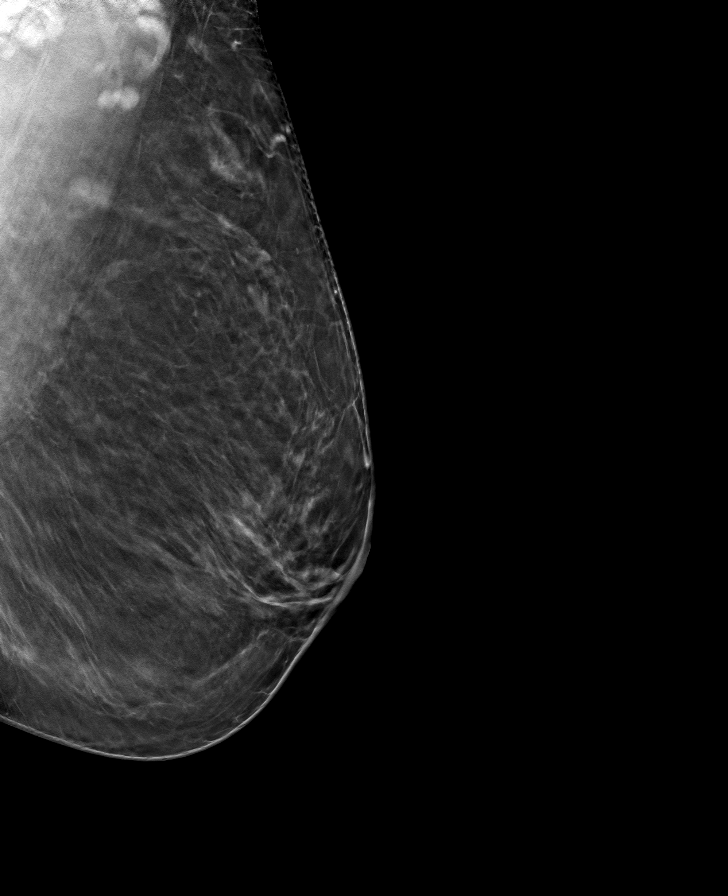

[7 of 14 positions shown; findings below may reference images not displayed]

Diagnostic mammogram was performed on 12/20/2015 for a possible
asymmetry identified within the left breast on a screening mammogram
of 12/13/2015. On that diagnostic report, the area in question was
deemed to represent superimposed fibroglandular tissues, with an
additional benign intramammary lymph node which was felt to be
likely contributory to the screening mammogram finding as well.

Diagnostic ultrasound report of 12/20/2015 described a vague
hypoechoic area within the left breast at the 11 o'clock axis, 8 cm
from the nipple, without mammographic correlate, corresponding to a
palpable abnormality described by the patient, for which
ultrasound-guided biopsy was recommended.

The ultrasound-guided biopsy was not performed as the finding could
not be reproduced at the time of the scheduled biopsy, suspected to
be benign fat lobule and/or artifact, and six-month follow-up
diagnostic examination was recommended to ensure stability of all
findings.

EXAM:
2D DIGITAL DIAGNOSTIC LEFT MAMMOGRAM WITH CAD AND ADJUNCT TOMO

ULTRASOUND LEFT BREAST
ACR Breast Density Category b: There are scattered areas of
fibroglandular density.
FINDINGS: The previously questioned asymmetry within the posterior left breast
remains stable in appearance, again consistent with normal
superimposed fibroglandular tissues, with adjacent benign
intramammary lymph node possibly contributory, and without
significant change overall compared to an earlier screening
mammogram of 11/19/2014.

There are no new dominant masses, suspicious calcifications or
secondary signs of malignancy within the left breast.

Mammographic images were processed with CAD.

Patient describes a palpable lump within the upper left breast. I
feel vague soft tissue thickening in this area without evidence of
fixed or circumscribed mass.

Targeted ultrasound is performed, again showing a vague hypoechoic
area within the left breast at the 11 o'clock axis, 8 cm from the
nipple, slightly smaller on today's exam with a measurement of 7 mm
greatest dimension (previously 9 mm), today most suggestive of
shadowing artifact due to overlying ligaments during real-time
ultrasound evaluation.

Additional targeted ultrasound of the area of patient's palpable
lump, as directed by the patient, shows only normal fibroglandular
tissues and fat lobules. No suspicious solid or cystic mass is
identified. The vague hypoechoic area described above is in the
vicinity of the palpable lump but does not correspond directly.
IMPRESSION: 1. Stable probably benign hypoechoic area within the left breast at
the 11 o'clock axis, 8 cm from the nipple, decreased in size
compared to the earlier ultrasound, without mammographic correlate,
most suggestive of shadowing artifact due to overlying ligaments
during real-time ultrasound evaluation.

2.  No mammographic evidence of malignancy.

3. No mammographic or sonographic evidence of malignancy at the site
of patient's palpable lump in the upper left breast. Patient states
that the lump has not changed since earlier exams.

Recommend additional follow-up diagnostic mammogram and ultrasound
in 6 months to ensure continued stability. This will be performed as
a bilateral diagnostic mammogram in conjunction with patient's
routine right breast screening mammogram schedule.

The patient was instructed to return sooner if the area that she
feels becomes larger and/or firmer to palpation, or if a new
palpable abnormality is identified in either breast.

RECOMMENDATION:
Bilateral diagnostic mammogram, and left breast ultrasound, in 6
months.

I have discussed the findings and recommendations with the patient.
Results were also provided in writing at the conclusion of the
visit. If applicable, a reminder letter will be sent to the patient
regarding the next appointment.

BI-RADS CATEGORY  3: Probably benign.

## 2018-11-22 IMAGING — US ULTRASOUND LEFT BREAST LIMITED
1 series · 6 of 6 positions shown · non-contrast
Comparison: Previous exams including diagnostic mammogram and
ultrasound dated 12/20/2015.

CLINICAL DATA: Six-month follow-up for probably benign finding in
the left breast.

[Series 1: ultrasound left breast limited · 0.07mm/px · 6 of 6 slices shown]
[im 1/6]
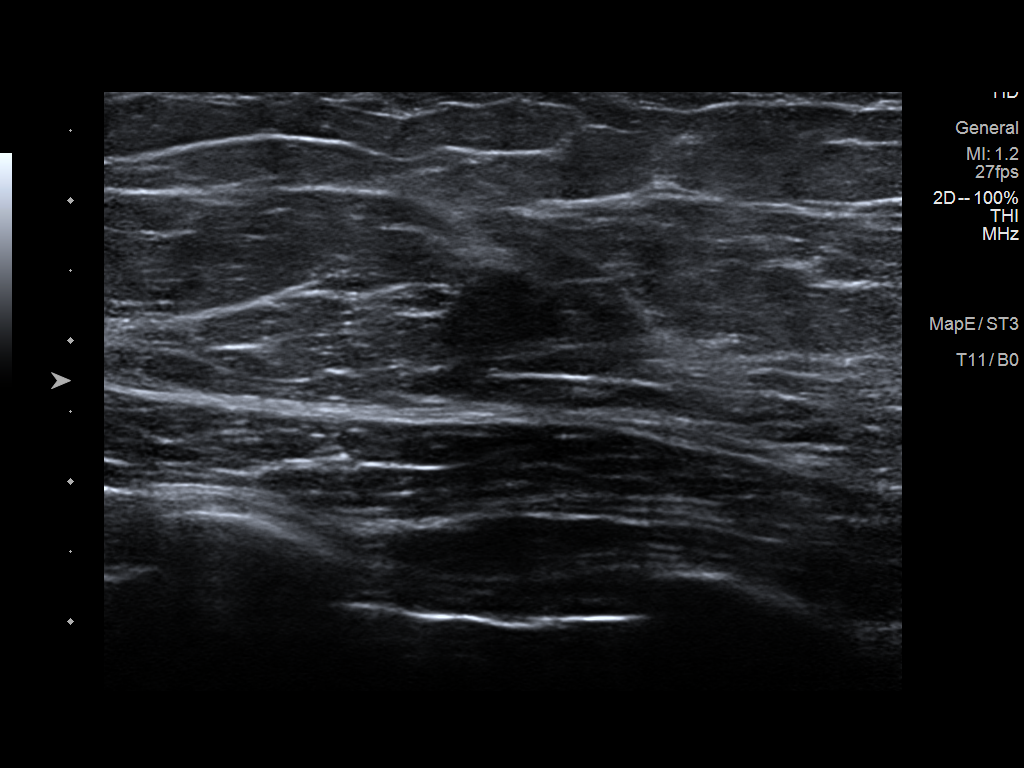
[im 2/6]
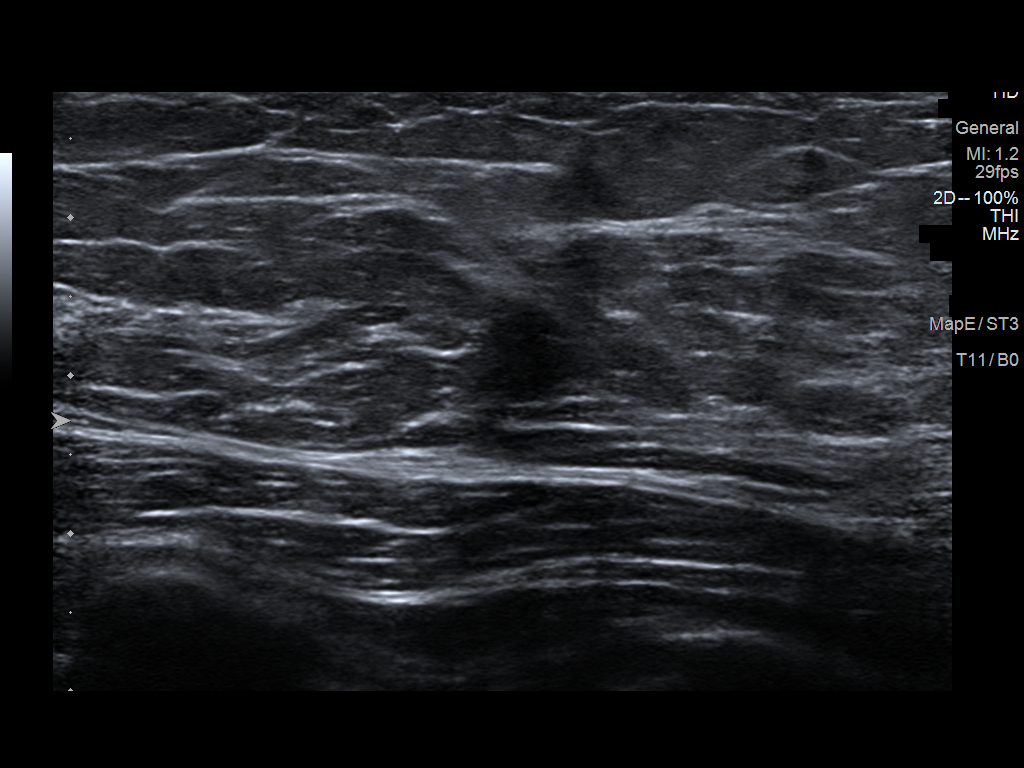
[im 3/6]
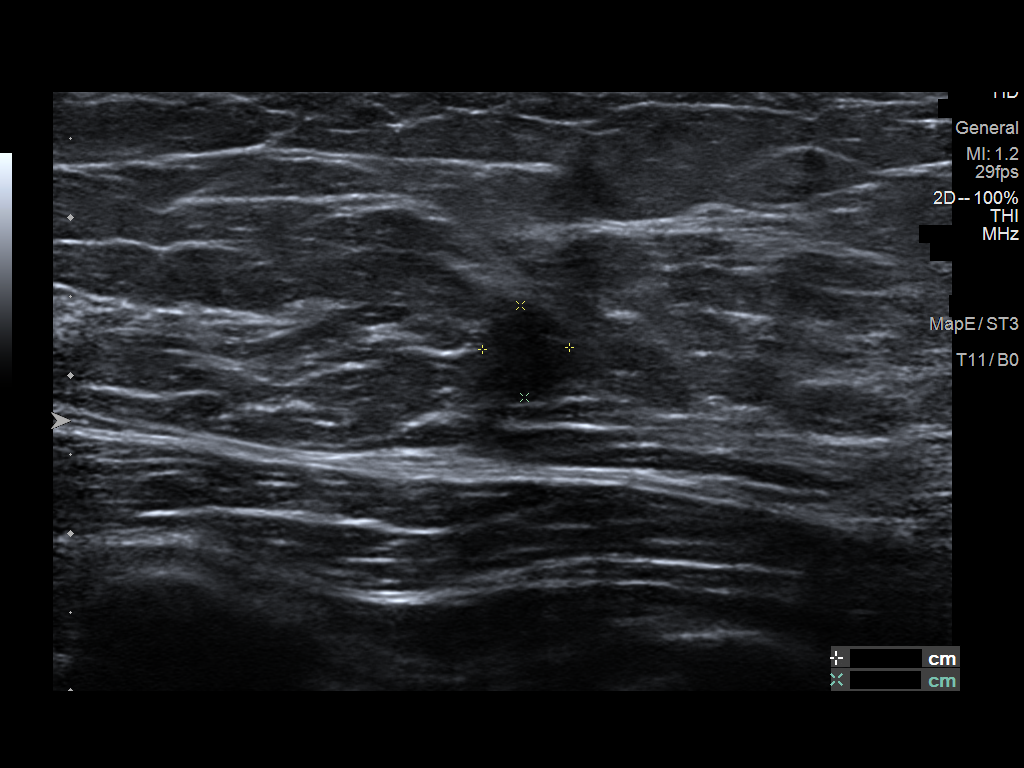
[im 4/6]
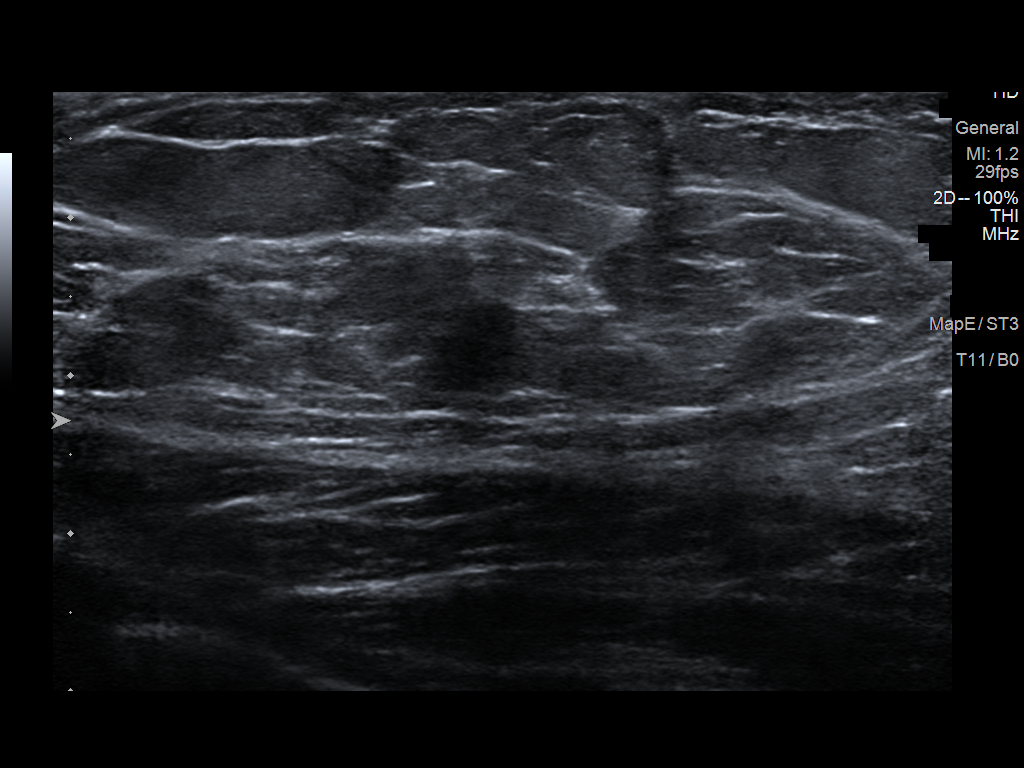
[im 5/6]
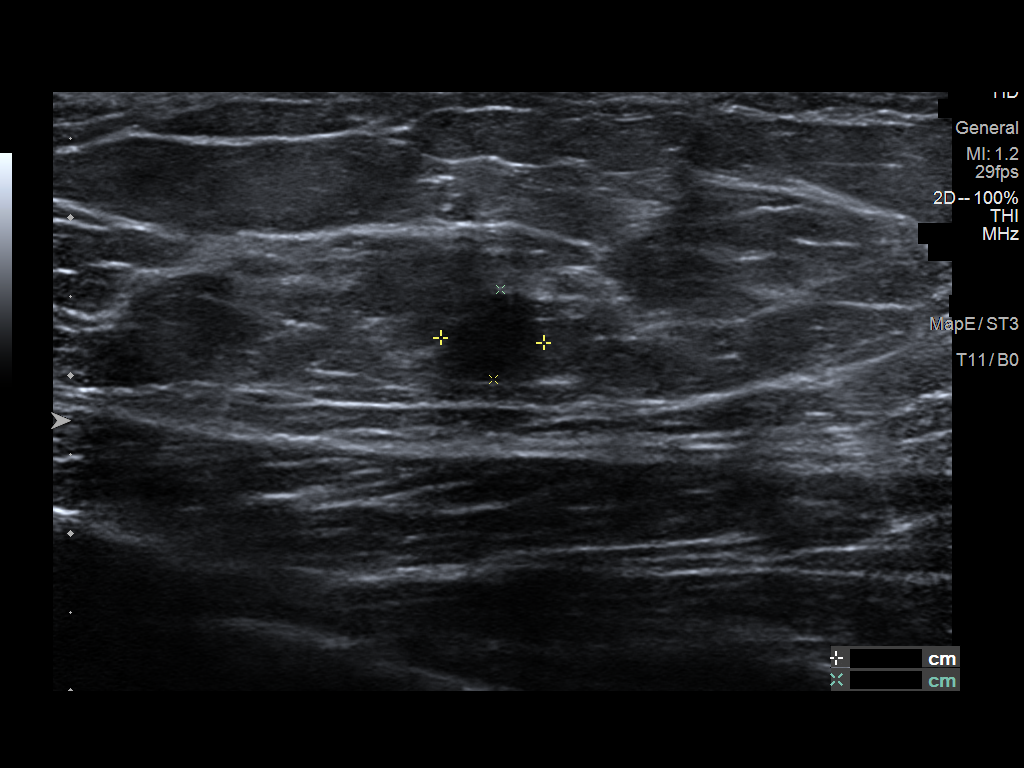
[im 6/6]
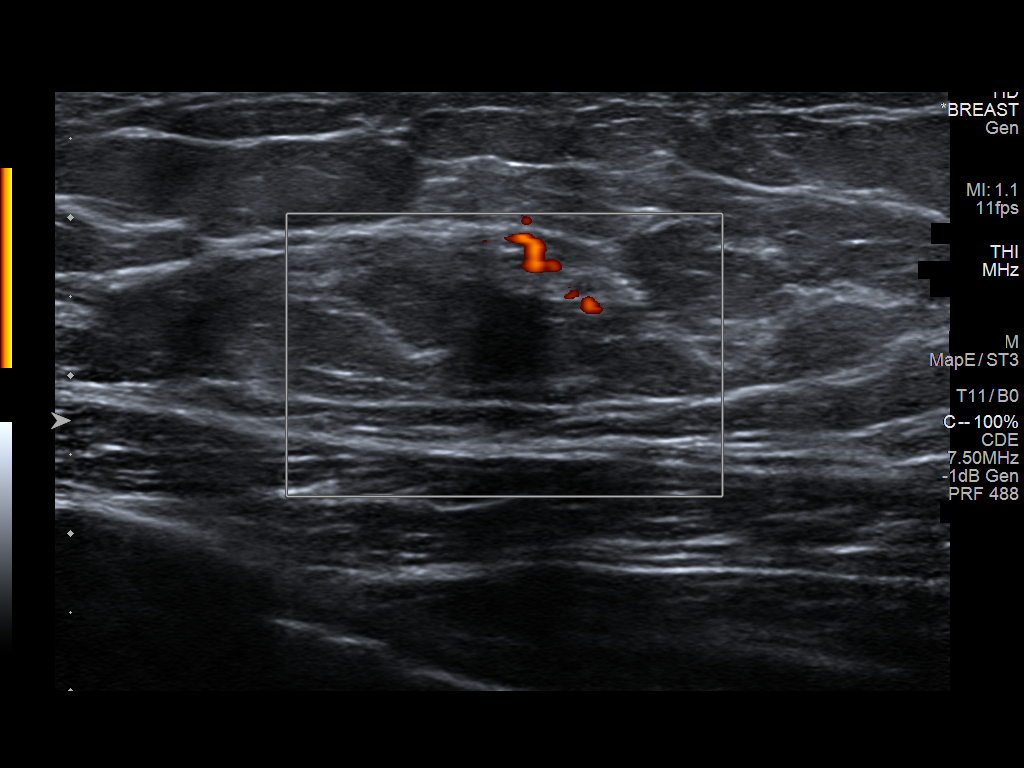

[6 of 6 positions shown; findings below may reference images not displayed]

Diagnostic mammogram was performed on 12/20/2015 for a possible
asymmetry identified within the left breast on a screening mammogram
of 12/13/2015. On that diagnostic report, the area in question was
deemed to represent superimposed fibroglandular tissues, with an
additional benign intramammary lymph node which was felt to be
likely contributory to the screening mammogram finding as well.

Diagnostic ultrasound report of 12/20/2015 described a vague
hypoechoic area within the left breast at the 11 o'clock axis, 8 cm
from the nipple, without mammographic correlate, corresponding to a
palpable abnormality described by the patient, for which
ultrasound-guided biopsy was recommended.

The ultrasound-guided biopsy was not performed as the finding could
not be reproduced at the time of the scheduled biopsy, suspected to
be benign fat lobule and/or artifact, and six-month follow-up
diagnostic examination was recommended to ensure stability of all
findings.

EXAM:
2D DIGITAL DIAGNOSTIC LEFT MAMMOGRAM WITH CAD AND ADJUNCT TOMO

ULTRASOUND LEFT BREAST
ACR Breast Density Category b: There are scattered areas of
fibroglandular density.
FINDINGS: The previously questioned asymmetry within the posterior left breast
remains stable in appearance, again consistent with normal
superimposed fibroglandular tissues, with adjacent benign
intramammary lymph node possibly contributory, and without
significant change overall compared to an earlier screening
mammogram of 11/19/2014.

There are no new dominant masses, suspicious calcifications or
secondary signs of malignancy within the left breast.

Mammographic images were processed with CAD.

Patient describes a palpable lump within the upper left breast. I
feel vague soft tissue thickening in this area without evidence of
fixed or circumscribed mass.

Targeted ultrasound is performed, again showing a vague hypoechoic
area within the left breast at the 11 o'clock axis, 8 cm from the
nipple, slightly smaller on today's exam with a measurement of 7 mm
greatest dimension (previously 9 mm), today most suggestive of
shadowing artifact due to overlying ligaments during real-time
ultrasound evaluation.

Additional targeted ultrasound of the area of patient's palpable
lump, as directed by the patient, shows only normal fibroglandular
tissues and fat lobules. No suspicious solid or cystic mass is
identified. The vague hypoechoic area described above is in the
vicinity of the palpable lump but does not correspond directly.
IMPRESSION: 1. Stable probably benign hypoechoic area within the left breast at
the 11 o'clock axis, 8 cm from the nipple, decreased in size
compared to the earlier ultrasound, without mammographic correlate,
most suggestive of shadowing artifact due to overlying ligaments
during real-time ultrasound evaluation.

2.  No mammographic evidence of malignancy.

3. No mammographic or sonographic evidence of malignancy at the site
of patient's palpable lump in the upper left breast. Patient states
that the lump has not changed since earlier exams.

Recommend additional follow-up diagnostic mammogram and ultrasound
in 6 months to ensure continued stability. This will be performed as
a bilateral diagnostic mammogram in conjunction with patient's
routine right breast screening mammogram schedule.

The patient was instructed to return sooner if the area that she
feels becomes larger and/or firmer to palpation, or if a new
palpable abnormality is identified in either breast.

RECOMMENDATION:
Bilateral diagnostic mammogram, and left breast ultrasound, in 6
months.

I have discussed the findings and recommendations with the patient.
Results were also provided in writing at the conclusion of the
visit. If applicable, a reminder letter will be sent to the patient
regarding the next appointment.

BI-RADS CATEGORY  3: Probably benign.

## 2018-12-06 ENCOUNTER — Encounter: Payer: Self-pay | Admitting: Family Medicine

## 2018-12-11 ENCOUNTER — Ambulatory Visit (HOSPITAL_COMMUNITY): Payer: Medicare Other | Admitting: Psychiatry

## 2018-12-11 DIAGNOSIS — Z6831 Body mass index (BMI) 31.0-31.9, adult: Secondary | ICD-10-CM | POA: Diagnosis not present

## 2018-12-11 DIAGNOSIS — I10 Essential (primary) hypertension: Secondary | ICD-10-CM | POA: Diagnosis not present

## 2018-12-11 DIAGNOSIS — M0600 Rheumatoid arthritis without rheumatoid factor, unspecified site: Secondary | ICD-10-CM | POA: Diagnosis not present

## 2018-12-11 DIAGNOSIS — M961 Postlaminectomy syndrome, not elsewhere classified: Secondary | ICD-10-CM | POA: Diagnosis not present

## 2018-12-17 ENCOUNTER — Other Ambulatory Visit (HOSPITAL_COMMUNITY): Payer: Self-pay

## 2018-12-17 MED ORDER — LAMOTRIGINE 200 MG PO TABS: ORAL_TABLET | ORAL | 0 refills | Status: DC

## 2018-12-17 MED ORDER — BUPROPION HCL ER (XL) 300 MG PO TB24: ORAL_TABLET | ORAL | 0 refills | Status: DC

## 2018-12-17 MED ORDER — LAMOTRIGINE 100 MG PO TABS: 100.0000 mg | ORAL_TABLET | Freq: Every morning | ORAL | 0 refills | Status: DC

## 2019-01-08 ENCOUNTER — Ambulatory Visit (INDEPENDENT_AMBULATORY_CARE_PROVIDER_SITE_OTHER): Payer: Medicare Other | Admitting: Psychiatry

## 2019-01-08 ENCOUNTER — Encounter (HOSPITAL_COMMUNITY): Payer: Self-pay | Admitting: Psychiatry

## 2019-01-08 DIAGNOSIS — F331 Major depressive disorder, recurrent, moderate: Secondary | ICD-10-CM

## 2019-01-08 DIAGNOSIS — M549 Dorsalgia, unspecified: Secondary | ICD-10-CM | POA: Diagnosis not present

## 2019-01-08 DIAGNOSIS — G8929 Other chronic pain: Secondary | ICD-10-CM | POA: Diagnosis not present

## 2019-01-08 DIAGNOSIS — F411 Generalized anxiety disorder: Secondary | ICD-10-CM | POA: Diagnosis not present

## 2019-01-08 MED ORDER — BUSPIRONE HCL 10 MG PO TABS: ORAL_TABLET | ORAL | 0 refills | Status: DC

## 2019-01-08 NOTE — Progress Notes (Signed)
Patient ID: Michelle Mueller, female   DOB: 01/11/1966, 53 y.o.   MRN: [id number]  Psychiatric Outpatient Follow up Visit  Patient Identification: Michelle Mueller MRN:  [id number] Date of Evaluation:  01/08/2019 Referral Source: [name] Chief Complaint:    depression . Follow up Visit Diagnosis: MDD. GAD . Mood related to Chronic back pain Diagnosis:   Patient Active Problem List   Diagnosis Date Noted  . Influenza [J11.1] 02/28/2018  . Pansinusitis [J32.4] 02/28/2018  . Rash [R21] 01/13/2018  . Hypothyroidism [E03.9] 01/13/2018  . Hyperlipidemia associated with type 2 diabetes mellitus ([place]) [E11.69, E78.5] 07/30/2016  . Rheumatoid arthritis of multiple sites with negative rheumatoid factor ([place]) [M06.09] 03/13/2016  . Chest pain of uncertain etiology [id number] 12/21/2014  . Essential hypertension [I10] 12/21/2014  . Myalgia [M79.10] 11/10/2014  . Insomnia [G47.00] 11/10/2014  . UTI (urinary tract infection) [N39.0] 08/04/2013  . Fibromyalgia muscle pain [M79.7] 06/13/2013  . Severe depression ([place]) [F32.2] 06/13/2013  . 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 Rheumatoid arthritis ([place]) [M06.9] 03/19/2013  . Fibromyalgia [M79.7] 03/19/2013  . Unspecified hypothyroidism [E03.9] 03/19/2013  . Goiter [E04.9] 03/19/2013  . Obesity (BMI 30-39.9) [E66.9] 03/19/2013  . 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 Family history of pancreatic cancer [Z80.0] 01/18/2011  . Chronic diarrhea [K52.9] 01/18/2011   I connected with Michelle Mueller on 01/08/19 at  8:30 AM EST by a video enabled telemedicine application and verified that I am speaking with the correct person using two identifiers.  I discussed the limitations of evaluation and management by telemedicine and the availability of in person appointments. The patient expressed understanding and agreed to proceed.   fetzima  was expensive, started effexor, didn't start. Says arthritic pain some better so wanted to keep other psych meds as such Endorses anxiety but buspar helps sometimes takes tid otherwise bid Have made new friends and liking [place] that has helped   She continues with lamictal, wellbutrin   Son goes to college but now virtual learning,  Modifying factor: kids  Timing:morning and when in pain  associated symptoms: back pain  Past Medical History:  Past Medical History:  Diagnosis Date  . Anxiety   . Arthritis   . Asthma    last attack 3 yrs ago  . Chicken pox   . Depression   . Diabetes mellitus without complication ([place])   . Fibromyalgia   . Goiter   . Hypertension    [name]  . Hypothyroidism   . Ovarian cyst   . PONV (postoperative nausea and vomiting)     Past Surgical History:  Procedure Laterality Date  . ABDOMINAL EXPOSURE  12/18/2011   Procedure: ABDOMINAL EXPOSURE;  Surgeon: [name], MD;  Location: [facility];  Service: Vascular;  Laterality: N/A;  Anterior Expossure for anterior lumbar interbody fuson  . ANTERIOR LUMBAR FUSION  12/18/2011   Procedure: ANTERIOR LUMBAR FUSION 1 LEVEL;  Surgeon: [name], MD;  Location: [facility];  Service: Neurosurgery;  Laterality: N/A;  Lumbar four-five Anterior Lumbar Interbody Fusion with Dr. [name] to do anterior exposure  . CESAREAN SECTION    . CHOLECYSTECTOMY    . HERNIA REPAIR    . LIPOMA EXCISION     L breast  . TONSILLECTOMY    . TUMOR EXCISION     Nerve sheath tumor, R arm   Family History:  Family History  Problem Relation Age of Onset  . Heart disease Father   . Lung cancer Father   . Colon cancer Maternal Aunt   . Pancreatic cancer Maternal Grandmother   . Pancreatic cancer Maternal Grandfather   . Pancreatic cancer Paternal Grandmother   . Cancer Paternal Grandfather   . Sexual abuse Neg Hx    Social History:   Social History   Socioeconomic History  . Marital status: Divorced     Spouse name: Not on file  . Number of children: Not on file  . Years of education: Not on file  . Highest education level: Not on file  Occupational History  . Not on file  Social Needs  . Financial resource strain: Not on file  . Food insecurity    Worry: Not on file    Inability: Not on file  . Transportation needs    Medical: Not on file    Non-medical: Not on file  Tobacco Use  . Smoking status: Never Smoker  . Smokeless tobacco: Never Used  Substance and Sexual Activity  . Alcohol use: No    Alcohol/week: 0.0 standard drinks  . Drug use: No  . Sexual activity: Not Currently    Birth control/protection: I.U.D., None  Lifestyle  . Physical activity    Days per week: Not on file    Minutes per session: Not on file  . Stress: Not on file  Relationships  . Social Herbalist on phone: Not on file    Gets together: Not on file    Attends religious service: Not on file    Active member of club or organization: Not on file    Attends meetings of clubs or organizations: Not on file    Relationship status: Not on file  Other Topics Concern  . Not on file  Social History Narrative  . Not on file      Psychiatric Specialty Exam:    Depression        Associated symptoms include does not have insomnia and no suicidal ideas.   Review of Systems  Cardiovascular: Negative for chest pain.  Musculoskeletal: Positive for joint pain.  Skin: Negative for rash.  Neurological: Negative for tremors.  Psychiatric/Behavioral: Negative for substance abuse and suicidal ideas. The patient does not have insomnia.     There were no vitals taken for this visit.There is no height or weight on file to calculate BMI.  General Appearance: fair  Eye Contact:  Good  Speech:  Clear and Coherent  Volume:  Normal  Mood: fair  Affect: congruent  Thought Process:  Coherent, Goal Directed, Intact, Linear and Logical  Orientation:  Full (Time, Place, and Person)  Thought Content:   WDL  Suicidal Thoughts:  No  Homicidal Thoughts:  No  Memory:  Immediate;   Good Recent;   Good Remote;   Good  Judgement:  Good  Insight:  Present  Psychomotor Activity:  Decreased  Concentration:  Fair  Recall:  Good  Fund of Knowledge:Good  Language: Good  Akathisia:  No  Handed:  Left  AIMS (if indicated):    Assets:  Communication Skills Desire for Improvement Financial Resources/Insurance Housing Resilience Transportation  ADL's:  Intact  Cognition: WNL  Sleep:  poor    Allergies:   Allergies  Allergen Reactions  . Erythromycin Other (See Comments)    arrhythmia  . Zocor  [Simvastatin-High Dose] Rash  . Doxycycline Other (See Comments)    Decreased BP and caused  dizziness per patient  . Peroxide [Hydrogen Peroxide]    Current Medications: Current Outpatient Medications  Medication Sig Dispense Refill  . atorvastatin (LIPITOR) 10 MG tablet TAKE 1 TABLET(10 MG) BY MOUTH DAILY 90 tablet 1  . buPROPion (WELLBUTRIN XL) 300 MG 24 hr tablet Take one tablet by mouth daily. 90 tablet 0  . busPIRone (BUSPAR) 10 MG tablet TAKE 1 TABLET BY MOUTH THREE TIMES DAILY 180 tablet 0  . Calcium-Magnesium-Vitamin D (CALCIUM MAGNESIUM PO) Take 1 tablet by mouth daily.    . cetirizine (ZYRTEC) 5 MG tablet Take 5 mg by mouth daily.    . Cholecalciferol (VITAMIN D) 2000 units CAPS Take 1 capsule by mouth daily.    . clotrimazole-betamethasone (LOTRISONE) cream Apply 1 application topically 2 (two) times daily. 30 g 0  . cyclobenzaprine (FLEXERIL) 5 MG tablet   1  . estradiol (CLIMARA) 0.05 mg/24hr patch Place 1 patch (0.05 mg total) onto the skin once a week. 24 patch 3  . etanercept (ENBREL) 50 MG/ML injection Inject into the skin.    . fluconazole (DIFLUCAN) 150 MG tablet 1 po x1, may repeat in 3 days prn 2 tablet 0  . gabapentin (NEURONTIN) 300 MG capsule Take 3 capsules by mouth at bedtime.     [name] glucose blood (ONE TOUCH ULTRA TEST) test strip Test blood sugar once daily. Dx  code: 250.00 100 each 12  . lamoTRIgine (LAMICTAL) 100 MG tablet Take 1 tablet (100 mg total) by mouth every morning. This is in addition to 200mg . Total dose now 300mg  per day 90 tablet 0  . lamoTRIgine (LAMICTAL) 200 MG tablet Take one tablet at bedtime. 90 tablet 0  . levofloxacin (LEVAQUIN) 500 MG tablet Take 1 tablet (500 mg total) by mouth daily. 7 tablet 0  . levonorgestrel (MIRENA) 20 MCG/24HR IUD 1 each by Intrauterine route once.    [name] levothyroxine (SYNTHROID) 50 MCG tablet Take 1 tablet (50 mcg total) by mouth daily before breakfast. 90 tablet 1  . lisinopril (PRINIVIL,ZESTRIL) 5 MG tablet Take 1 tablet (5 mg total) by mouth daily. 90 tablet 0  . lisinopril (ZESTRIL) 5 MG tablet TAKE 1 TABLET BY MOUTH EVERY DAY 90 tablet 1  . metFORMIN (GLUCOPHAGE-XR) 500 MG 24 hr tablet TAKE 1 TABLET(500 MG) BY MOUTH EVERY EVENING 90 tablet 1  . Multiple Vitamin (MULTIVITAMIN WITH MINERALS) TABS Take 1 tablet by mouth daily.    . mupirocin ointment (BACTROBAN) 2 % APPLY TO LESION ON LEFT BREAST TWICE DAILY FOR 14 DAYS  0  . naproxen sodium (ANAPROX) 220 MG tablet Take 220 mg by mouth.    Glory Rosebush DELICA LANCETS [id number] MISC Test blood sugar once daily. Dx code: 250.00 100 each 12  . OVER THE COUNTER MEDICATION Vitamin B-12 3000 mcg 1 table daily.    [name] OVER THE COUNTER MEDICATION Amberen 2 tablets every morning    . oxyCODONE 10 MG TABS Take 1 tablet (10 mg total) by mouth every 6 (six) hours as needed (for pain). 60 tablet 0  . Phenylephrine-Acetaminophen (TYLENOL SINUS CONGESTION/PAIN PO) Take 2 tablets by mouth daily as needed (for sinus congestion).     [name] terconazole (TERAZOL 7) 0.4 % vaginal cream Place 1 applicator vaginally at bedtime. Use for seven days 45 g 0  . tretinoin (RETIN-A) 0.025 % gel Apply 3 nights weekly x 2 weeks can increase to nightly only as tolerated     No current facility-administered medications for this visit.  Treatment Plan Summary: Medication management  1.  Depression:: fair, continue wellbutrin  Continue lamictal. No rash  Also recommend CBT or DBT for recurrent depression and mood symptoms coping skills 2. Anxiety fluctuates, can take buspar uptil tid 3. Insomnia:manageable, avoid daytime napes 4. Back pain:chronic. Working with providers and symptomatic management. FU for RA    Add more Me time and activitiy time to distract from dysporia  I discussed the assessment and treatment plan with the patient. The patient was provided an opportunity to ask questions and all were answered. The patient agreed with the plan and demonstrated an understanding of the instructions.   The patient was advised to call back or seek an in-person evaluation if the symptoms worsen or if the condition fails to improve as anticipated. FU patient 2-85m.  Merian Capron, MD  8:44 AM 01/08/2019

## 2019-01-15 DIAGNOSIS — Z23 Encounter for immunization: Secondary | ICD-10-CM | POA: Diagnosis not present

## 2019-01-15 DIAGNOSIS — L709 Acne, unspecified: Secondary | ICD-10-CM | POA: Diagnosis not present

## 2019-01-15 DIAGNOSIS — M0609 Rheumatoid arthritis without rheumatoid factor, multiple sites: Secondary | ICD-10-CM | POA: Diagnosis not present

## 2019-01-15 DIAGNOSIS — G8929 Other chronic pain: Secondary | ICD-10-CM | POA: Diagnosis not present

## 2019-01-15 DIAGNOSIS — M797 Fibromyalgia: Secondary | ICD-10-CM | POA: Diagnosis not present

## 2019-01-15 DIAGNOSIS — G5603 Carpal tunnel syndrome, bilateral upper limbs: Secondary | ICD-10-CM | POA: Diagnosis not present

## 2019-01-15 DIAGNOSIS — M545 Low back pain: Secondary | ICD-10-CM | POA: Diagnosis not present

## 2019-01-15 DIAGNOSIS — Z79899 Other long term (current) drug therapy: Secondary | ICD-10-CM | POA: Diagnosis not present

## 2019-01-15 DIAGNOSIS — G479 Sleep disorder, unspecified: Secondary | ICD-10-CM | POA: Diagnosis not present

## 2019-01-15 DIAGNOSIS — L309 Dermatitis, unspecified: Secondary | ICD-10-CM | POA: Diagnosis not present

## 2019-01-22 ENCOUNTER — Encounter: Payer: Self-pay | Admitting: Family Medicine

## 2019-01-22 DIAGNOSIS — I10 Essential (primary) hypertension: Secondary | ICD-10-CM

## 2019-01-22 DIAGNOSIS — E039 Hypothyroidism, unspecified: Secondary | ICD-10-CM

## 2019-01-22 DIAGNOSIS — E785 Hyperlipidemia, unspecified: Secondary | ICD-10-CM

## 2019-01-22 MED ORDER — LISINOPRIL 5 MG PO TABS: 5.0000 mg | ORAL_TABLET | Freq: Every day | ORAL | 1 refills | Status: AC

## 2019-01-22 MED ORDER — ATORVASTATIN CALCIUM 10 MG PO TABS: ORAL_TABLET | ORAL | 1 refills | Status: AC

## 2019-01-23 MED ORDER — LEVOTHYROXINE SODIUM 50 MCG PO TABS: 50.0000 ug | ORAL_TABLET | Freq: Every day | ORAL | 1 refills | Status: AC

## 2019-01-23 NOTE — Addendum Note (Signed)
Addended by: Sanda Linger on: 01/23/2019 10:35 AM   Modules accepted: Orders

## 2019-03-06 ENCOUNTER — Other Ambulatory Visit (HOSPITAL_COMMUNITY): Payer: Self-pay

## 2019-03-06 MED ORDER — LAMOTRIGINE 200 MG PO TABS: ORAL_TABLET | ORAL | 0 refills | Status: DC

## 2019-03-18 ENCOUNTER — Other Ambulatory Visit (HOSPITAL_COMMUNITY): Payer: Self-pay

## 2019-03-18 MED ORDER — LAMOTRIGINE 100 MG PO TABS: 100.0000 mg | ORAL_TABLET | Freq: Every morning | ORAL | 0 refills | Status: DC

## 2019-03-18 MED ORDER — BUPROPION HCL ER (XL) 300 MG PO TB24: ORAL_TABLET | ORAL | 0 refills | Status: DC

## 2019-04-08 ENCOUNTER — Encounter (HOSPITAL_COMMUNITY): Payer: Self-pay | Admitting: Psychiatry

## 2019-04-08 ENCOUNTER — Ambulatory Visit (INDEPENDENT_AMBULATORY_CARE_PROVIDER_SITE_OTHER): Payer: Medicare PPO | Admitting: Psychiatry

## 2019-04-08 DIAGNOSIS — F411 Generalized anxiety disorder: Secondary | ICD-10-CM

## 2019-04-08 DIAGNOSIS — F331 Major depressive disorder, recurrent, moderate: Secondary | ICD-10-CM | POA: Diagnosis not present

## 2019-04-08 DIAGNOSIS — M549 Dorsalgia, unspecified: Secondary | ICD-10-CM

## 2019-04-08 DIAGNOSIS — G8929 Other chronic pain: Secondary | ICD-10-CM

## 2019-04-08 NOTE — Progress Notes (Signed)
Patient ID: Michelle Mueller, female   DOB: 03-13-1965, 54 y.o.   MRN: [id number]  Psychiatric Outpatient Follow up Visit  Patient Identification: Michelle Mueller MRN:  [id number] Date of Evaluation:  04/08/2019 Referral Source: [name] Chief Complaint:    depression . Follow up Visit Diagnosis: MDD. GAD . Mood related to Chronic back pain Diagnosis:   Patient Active Problem List   Diagnosis Date Noted  . Influenza [J11.1] 02/28/2018  . Pansinusitis [J32.4] 02/28/2018  . Rash [R21] 01/13/2018  . Hypothyroidism [E03.9] 01/13/2018  . Hyperlipidemia associated with type 2 diabetes mellitus ([place]) [E11.69, E78.5] 07/30/2016  . Rheumatoid arthritis of multiple sites with negative rheumatoid factor ([place]) [M06.09] 03/13/2016  . Chest pain of uncertain etiology [id number] 12/21/2014  . Essential hypertension [I10] 12/21/2014  . Myalgia [M79.10] 11/10/2014  . Insomnia [G47.00] 11/10/2014  . UTI (urinary tract infection) [N39.0] 08/04/2013  . Fibromyalgia muscle pain [M79.7] 06/13/2013  . Severe depression ([place]) [F32.2] 06/13/2013  . 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 Rheumatoid arthritis ([place]) [M06.9] 03/19/2013  . Fibromyalgia [M79.7] 03/19/2013  . Unspecified hypothyroidism [E03.9] 03/19/2013  . Goiter [E04.9] 03/19/2013  . Obesity (BMI 30-39.9) [E66.9] 03/19/2013  . 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 Family history of pancreatic cancer [Z80.0] 01/18/2011  . Chronic diarrhea [K52.9] 01/18/2011    I connected with Michelle Mueller on 04/08/19 at 10:00 AM EST by a video enabled telemedicine application and verified that I am speaking with the correct person using two identifiers. I discussed the limitations of evaluation and management by telemedicine and the availability of in person appointments. The patient expressed understanding and agreed to proceed.   Past  meds : fetzima was expensive, started effexor, didn't start.    Stress related to moving again for affordable place in [place] Has made friends here  Pain and tiredness effects mood, plans to change RA med Feels subdued but feels it may be related to above stressors Otherwise liking the beach and have made friends  She continues with lamictal, wellbutrin   Son goes to college but now virtual learning,  Modifying factor: kids,   Timing:morning and when in pain  associated symptoms: back pain  Past Medical History:  Past Medical History:  Diagnosis Date  . Anxiety   . Arthritis   . Asthma    last attack 3 yrs ago  . Chicken pox   . Depression   . Diabetes mellitus without complication ([place])   . Fibromyalgia   . Goiter   . Hypertension    [name]  . Hypothyroidism   . Ovarian cyst   . PONV (postoperative nausea and vomiting)     Past Surgical History:  Procedure Laterality Date  . ABDOMINAL EXPOSURE  12/18/2011   Procedure: ABDOMINAL EXPOSURE;  Surgeon: [name], MD;  Location: [facility];  Service: Vascular;  Laterality: N/A;  Anterior Expossure for anterior lumbar interbody fuson  . ANTERIOR LUMBAR FUSION  12/18/2011   Procedure: ANTERIOR LUMBAR FUSION 1 LEVEL;  Surgeon: [name], MD;  Location: [facility];  Service: Neurosurgery;  Laterality: N/A;  Lumbar four-five Anterior Lumbar Interbody Fusion with Dr. [name] to do anterior exposure  . CESAREAN SECTION    . CHOLECYSTECTOMY    . HERNIA REPAIR    . LIPOMA EXCISION     L breast  . TONSILLECTOMY    . TUMOR EXCISION  Nerve sheath tumor, R arm   Family History:  Family History  Problem Relation Age of Onset  . Heart disease Father   . Lung cancer Father   . Colon cancer Maternal Aunt   . Pancreatic cancer Maternal Grandmother   . Pancreatic cancer Maternal Grandfather   . Pancreatic cancer Paternal Grandmother   . Cancer Paternal Grandfather   . Sexual abuse Neg Hx    Social History:    Social History   Socioeconomic History  . Marital status: Divorced    Spouse name: Not on file  . Number of children: Not on file  . Years of education: Not on file  . Highest education level: Not on file  Occupational History  . Not on file  Tobacco Use  . Smoking status: Never Smoker  . Smokeless tobacco: Never Used  Substance and Sexual Activity  . Alcohol use: No    Alcohol/week: 0.0 standard drinks  . Drug use: No  . Sexual activity: Not Currently    Birth control/protection: I.U.D., None  Other Topics Concern  . Not on file  Social History Narrative  . Not on file   Social Determinants of Health   Financial Resource Strain:   . Difficulty of Paying Living Expenses: Not on file  Food Insecurity:   . Worried About Charity fundraiser in the Last Year: Not on file  . Ran Out of Food in the Last Year: Not on file  Transportation Needs:   . Lack of Transportation (Medical): Not on file  . Lack of Transportation (Non-Medical): Not on file  Physical Activity:   . Days of Exercise per Week: Not on file  . Minutes of Exercise per Session: Not on file  Stress:   . Feeling of Stress : Not on file  Social Connections:   . Frequency of Communication with Friends and Family: Not on file  . Frequency of Social Gatherings with Friends and Family: Not on file  . Attends Religious Services: Not on file  . Active Member of Clubs or Organizations: Not on file  . Attends Archivist Meetings: Not on file  . Marital Status: Not on file      Psychiatric Specialty Exam:    Depression        Associated symptoms include does not have insomnia and no suicidal ideas.   Review of Systems  Cardiovascular: Negative for chest pain.  Musculoskeletal: Positive for joint pain.  Psychiatric/Behavioral: Positive for depression. Negative for substance abuse and suicidal ideas. The patient does not have insomnia.     There were no vitals taken for this visit.There is no height  or weight on file to calculate BMI.  General Appearance: fair  Eye Contact:  Good  Speech:  Clear and Coherent  Volume:  Normal  Mood: somewhat subdued  Affect: congruent  Thought Process:  Coherent, Goal Directed, Intact, Linear and Logical  Orientation:  Full (Time, Place, and Person)  Thought Content:  WDL  Suicidal Thoughts:  No  Homicidal Thoughts:  No  Memory:  Immediate;   Good Recent;   Good Remote;   Good  Judgement:  Good  Insight:  Present  Psychomotor Activity:  Decreased  Concentration:  Fair  Recall:  Good  Fund of Knowledge:Good  Language: Good  Akathisia:  No  Handed:  Left  AIMS (if indicated):    Assets:  Communication Skills Desire for Improvement Financial Resources/Insurance Housing Resilience Transportation  ADL's:  Intact  Cognition: WNL  Sleep:  poor    Allergies:   Allergies  Allergen Reactions  . Erythromycin Other (See Comments)    arrhythmia  . Zocor  [Simvastatin-High Dose] Rash  . Doxycycline Other (See Comments)    Decreased BP and caused dizziness per patient  . Peroxide [Hydrogen Peroxide]    Current Medications: Current Outpatient Medications  Medication Sig Dispense Refill  . atorvastatin (LIPITOR) 10 MG tablet TAKE 1 TABLET(10 MG) BY MOUTH DAILY 90 tablet 1  . buPROPion (WELLBUTRIN XL) 300 MG 24 hr tablet Take one tablet by mouth daily. 90 tablet 0  . busPIRone (BUSPAR) 10 MG tablet TAKE 1 TABLET BY MOUTH THREE TIMES DAILY 180 tablet 0  . Calcium-Magnesium-Vitamin D (CALCIUM MAGNESIUM PO) Take 1 tablet by mouth daily.    . cetirizine (ZYRTEC) 5 MG tablet Take 5 mg by mouth daily.    . Cholecalciferol (VITAMIN D) 2000 units CAPS Take 1 capsule by mouth daily.    . clotrimazole-betamethasone (LOTRISONE) cream Apply 1 application topically 2 (two) times daily. 30 g 0  . cyclobenzaprine (FLEXERIL) 5 MG tablet   1  . estradiol (CLIMARA) 0.05 mg/24hr patch Place 1 patch (0.05 mg total) onto the skin once a week. 24 patch 3  .  etanercept (ENBREL) 50 MG/ML injection Inject into the skin.    . fluconazole (DIFLUCAN) 150 MG tablet 1 po x1, may repeat in 3 days prn 2 tablet 0  . gabapentin (NEURONTIN) 300 MG capsule Take 3 capsules by mouth at bedtime.     [name] glucose blood (ONE TOUCH ULTRA TEST) test strip Test blood sugar once daily. Dx code: 250.00 100 each 12  . lamoTRIgine (LAMICTAL) 100 MG tablet Take 1 tablet (100 mg total) by mouth every morning. This is in addition to 200mg . Total dose now 300mg  per day 90 tablet 0  . lamoTRIgine (LAMICTAL) 200 MG tablet Take one tablet at bedtime. 90 tablet 0  . levofloxacin (LEVAQUIN) 500 MG tablet Take 1 tablet (500 mg total) by mouth daily. 7 tablet 0  . levonorgestrel (MIRENA) 20 MCG/24HR IUD 1 each by Intrauterine route once.    [name] levothyroxine (SYNTHROID) 50 MCG tablet Take 1 tablet (50 mcg total) by mouth daily before breakfast. 90 tablet 1  . lisinopril (ZESTRIL) 5 MG tablet Take 1 tablet (5 mg total) by mouth daily. 90 tablet 1  . metFORMIN (GLUCOPHAGE-XR) 500 MG 24 hr tablet TAKE 1 TABLET(500 MG) BY MOUTH EVERY EVENING 90 tablet 1  . Multiple Vitamin (MULTIVITAMIN WITH MINERALS) TABS Take 1 tablet by mouth daily.    . mupirocin ointment (BACTROBAN) 2 % APPLY TO LESION ON LEFT BREAST TWICE DAILY FOR 14 DAYS  0  . naproxen sodium (ANAPROX) 220 MG tablet Take 220 mg by mouth.    Glory Rosebush DELICA LANCETS [id number] MISC Test blood sugar once daily. Dx code: 250.00 100 each 12  . OVER THE COUNTER MEDICATION Vitamin B-12 3000 mcg 1 table daily.    [name] OVER THE COUNTER MEDICATION Amberen 2 tablets every morning    . oxyCODONE 10 MG TABS Take 1 tablet (10 mg total) by mouth every 6 (six) hours as needed (for pain). 60 tablet 0  . Phenylephrine-Acetaminophen (TYLENOL SINUS CONGESTION/PAIN PO) Take 2 tablets by mouth daily as needed (for sinus congestion).     [name] terconazole (TERAZOL 7) 0.4 % vaginal cream Place 1 applicator vaginally at bedtime. Use for seven days 45 g 0  . tretinoin  (RETIN-A) 0.025 % gel Apply 3 nights  weekly x 2 weeks can increase to nightly only as tolerated     No current facility-administered medications for this visit.     Treatment Plan Summary: Medication management  1. Depression::subdued, feels stressors effect mood, planning to do therapy recommend as possible low esteem and borderline traits as per history . Continue lamictal, wellbutrin. Nor rash She feels meds are doing what they can would add therapy  2. Anxiety fluctuates, can take buspar tid has been taking bid  3. Insomnia:manageable, avoid daytime napes 4. Back pain:chronic. Working with providers and symptomatic management. FU for RA    Add more Me time and activitiy time to distract from dysporia  I discussed the assessment and treatment plan with the patient. The patient was provided an opportunity to ask questions and all were answered. The patient agreed with the plan and demonstrated an understanding of the instructions.   The patient was advised to call back or seek an in-person evaluation if the symptoms worsen or if the condition fails to improve as anticipated. FU 1-36m.  Merian Capron, MD  10:20 AM 04/08/2019

## 2019-04-11 ENCOUNTER — Telehealth: Payer: Self-pay | Admitting: Family Medicine

## 2019-04-11 NOTE — Chronic Care Management (AMB) (Signed)
  Chronic Care Management   Note  04/11/2019 Name: CORTNEI REEVER MRN: [id number] DOB: 1965/04/09  GEORGEANNE FROME is a 54 y.o. year old female who is a primary care patient of Ann Held, DO. I reached out to [employer] by phone today in response to a referral sent by Ms. Rudie Meyer Hagins's PCP, Carollee Herter, Alferd Apa, DO.   Ms. Ramdass was given information about Chronic Care Management services today including:  1. CCM service includes personalized support from designated clinical staff supervised by her physician, including individualized plan of care and coordination with other care providers 2. 24/7 contact phone numbers for assistance for urgent and routine care needs. 3. Service will only be billed when office clinical staff spend 20 minutes or more in a month to coordinate care. 4. Only one practitioner may furnish and bill the service in a calendar month. 5. The patient may stop CCM services at any time (effective at the end of the month) by phone call to the office staff.   Patient agreed to services and verbal consent obtained.   Follow up plan:   Raynicia Dukes [employer] Scheduler

## 2019-04-11 NOTE — Progress Notes (Signed)
Patient enrolled in CCM. Patient stated she has new insurance cards that have not been updated on file. [employer] # [id number]. I advised patient that  her new insurance has a $20 co pay. Patient stated she would have it covered under medicare since services were provided at PCP office. Patient kept her CCM appt with the CPP.    Raynicia Dukes [employer] Scheduler

## 2019-04-28 ENCOUNTER — Other Ambulatory Visit: Payer: Self-pay

## 2019-04-28 ENCOUNTER — Ambulatory Visit: Payer: Self-pay | Admitting: Pharmacist

## 2019-04-28 DIAGNOSIS — E039 Hypothyroidism, unspecified: Secondary | ICD-10-CM

## 2019-04-28 DIAGNOSIS — E1169 Type 2 diabetes mellitus with other specified complication: Secondary | ICD-10-CM

## 2019-04-28 DIAGNOSIS — F322 Major depressive disorder, single episode, severe without psychotic features: Secondary | ICD-10-CM

## 2019-04-28 DIAGNOSIS — E1165 Type 2 diabetes mellitus with hyperglycemia: Secondary | ICD-10-CM

## 2019-04-28 DIAGNOSIS — E785 Hyperlipidemia, unspecified: Secondary | ICD-10-CM

## 2019-04-28 DIAGNOSIS — E11620 Type 2 diabetes mellitus with diabetic dermatitis: Secondary | ICD-10-CM

## 2019-04-28 DIAGNOSIS — M797 Fibromyalgia: Secondary | ICD-10-CM

## 2019-04-28 DIAGNOSIS — I10 Essential (primary) hypertension: Secondary | ICD-10-CM

## 2019-04-28 DIAGNOSIS — M0609 Rheumatoid arthritis without rheumatoid factor, multiple sites: Secondary | ICD-10-CM

## 2019-04-28 NOTE — Chronic Care Management (AMB) (Signed)
Chronic Care Management Pharmacy  Name: Michelle Mueller  MRN: [id number] DOB: Apr 02, 1965  Chief Complaint/ HPI  Michelle Mueller,  54 y.o. , female presents for their Initial CCM visit with the clinical pharmacist via telephone due to COVID-19 Pandemic.  PCP : Ann Held, DO  Their chronic conditions include: Pre-DM/DM, HTN, HLD, Hypothyroidism, Depression/Anxiety, Rheumatoid Arthritis, Pain, Acne, Allergic Rhinitis, Hormone Replacement, Supplementation  Office Visits: 09/17/18: Visit w/ Dr. Etter Sjogren - Labs ordered (cmp, cbc, a1c, lipid, tsh). No med changes noted.  Consult Visit: 04/16/19: Rheum visit w/ Dr. Remer Macho - Extensive medication history for RA described. Pt showing mild to moderate disease activity. Pt currently taking sarilumab with partial response. Recommended that patient continue for another 2 months to assess maximal response. If not adequately improved, recommend treatment with JAK inhibitor. Labs ordered (CBC, AST, ALT) RTC in 2-3 months to see [name] and 5 months to see Dr. Francella Solian. [name]  04/08/19: Psych visit w/ Dr. De Nurse - Depression. Patient stressed due to move to [facility]. Continue lamictal and wellbutrin. Can take Buspar TID vs BID for anxiety. F/U in 1-2 months  03/19/19: Gastro Upper Endoscopy and Colonoscopy w/ Dr. Lindaann Pascal - Small hemrrhoids found in perianal exam. Diverticulosis of sigmoid colon. Repeat colonoscopy in 10 years.   02/03/19: Rheum visit w/ [name], NP - RA flare. Pt took sarilumab and naproxen. This didn't help. She requested PDN taper. PDN Taper prescribed.   01/15/19: Rheum visit w/ [name], NP - RA follow up. Mild improvement with Kevzara. Consider Morrie Sheldon or JAK at f/u. Labs ordered (cbc, cmp, crp, lipid panel). Referral to pain clinic for carpal tunnel, low back pain, and fibromyalgia. F/U w/ Dr. Francella Solian. [name] 03/2019 and [name] 07/2019.  01/15/19: Derm visit w/ Dr. Bryson Dames - New onset acne. Using mirena and estrogen patch.  Using tretinoin on persistent chin acne. Tried to go off of mirena, but had heavy bleeding so went back on. Allergy to doxycyline. Continue tretinoin at night. Start metrogel BID. Pt prefers mirena IUD, so she will remain on it per ob/gyn  01/08/19: Psych visit w/ Dr. De Nurse - Depression. Recommend CBT or DBT. No med changes noted.  Medications: Outpatient Encounter Medications as of 04/28/2019  Medication Sig Note  . atorvastatin (LIPITOR) 10 MG tablet TAKE 1 TABLET(10 MG) BY MOUTH DAILY   . buPROPion (WELLBUTRIN XL) 300 MG 24 hr tablet Take one tablet by mouth daily.   . busPIRone (BUSPAR) 10 MG tablet TAKE 1 TABLET BY MOUTH THREE TIMES DAILY   . cetirizine (ZYRTEC) 10 MG tablet Take 10 mg by mouth daily.    . Cholecalciferol (VITAMIN D) 2000 units CAPS Take 1 capsule by mouth daily.   . clotrimazole-betamethasone (LOTRISONE) cream Apply 1 application topically 2 (two) times daily.   . cyclobenzaprine (FLEXERIL) 5 MG tablet  10/21/2014: Received from: External Pharmacy  . estradiol (CLIMARA) 0.05 mg/24hr patch Place 1 patch (0.05 mg total) onto the skin once a week. (Patient taking differently: Place 0.05 mg onto the skin once a week. Twice weekly)   . gabapentin (NEURONTIN) 300 MG capsule Take 3 capsules by mouth at bedtime. Takes 1AM + 4HS 03/19/2013: Received from: External Pharmacy Received Sig:   . glucose blood (ONE TOUCH ULTRA TEST) test strip Test blood sugar once daily. Dx code: 250.00   . L-Glutamine 500 MG TABS Take 1 tablet by mouth daily.   . Lactobacillus (ACIDOPHILUS PO) Take 1 capsule by mouth daily. 2 Billion active  cultures   . lamoTRIgine (LAMICTAL) 100 MG tablet Take 1 tablet (100 mg total) by mouth every morning. This is in addition to 200mg . Total dose now 300mg  per Mer[facility] Stallworth   . lamoTRIgine (LAMICTAL) 200 MG tablet Take one tablet at bedtime.   [name] levonorgestrel (MIRENA) 20 MCG/24HR IUD 1 each by Intrauterine route once.   [name] levothyroxine (SYNTHROID) 50 MCG tablet Take 1 tablet  (50 mcg total) by mouth daily before breakfast.   . lisinopril (ZESTRIL) 5 MG tablet Take 1 tablet (5 mg total) by mouth daily.   . metFORMIN (GLUCOPHAGE-XR) 500 MG 24 hr tablet TAKE 1 TABLET(500 MG) BY MOUTH EVERY EVENING   . naproxen sodium (ANAPROX) 220 MG tablet Take 220 mg by mouth.   Glory Rosebush DELICA LANCETS [id number] MISC Test blood sugar once daily. Dx code: 250.00   . OVER THE COUNTER MEDICATION Vitamin B-12 5000 mcg 1 tablet daily.   [name] oxyCODONE 10 MG TABS Take 1 tablet (10 mg total) by mouth every 6 (six) hours as needed (for pain).   . Phenylephrine-Acetaminophen (TYLENOL SINUS CONGESTION/PAIN PO) Take 2 tablets by mouth daily as needed (for sinus congestion).    . Sarilumab (KEVZARA) 200 MG/1.14ML SOAJ Inject 200 mg into the skin. Every 2 weeks   . tretinoin (RETIN-A) 0.025 % gel Apply 3 nights weekly x 2 weeks can increase to nightly only as tolerated   . Calcium-Magnesium-Vitamin D (CALCIUM MAGNESIUM PO) Take 1 tablet by mouth daily.   [name] etanercept (ENBREL) 50 MG/ML injection Inject into the skin. 04/28/2019: Switched to [employer]  . fluconazole (DIFLUCAN) 150 MG tablet 1 po x1, may repeat in 3 days prn (Patient not taking: Reported on 04/28/2019)   . levofloxacin (LEVAQUIN) 500 MG tablet Take 1 tablet (500 mg total) by mouth daily. (Patient not taking: Reported on 04/28/2019)   . Multiple Vitamin (MULTIVITAMIN WITH MINERALS) TABS Take 1 tablet by mouth daily.   . mupirocin ointment (BACTROBAN) 2 % APPLY TO LESION ON LEFT BREAST TWICE DAILY FOR 14 DAYS   . OVER THE COUNTER MEDICATION Amberen 2 tablets every morning   . terconazole (TERAZOL 7) 0.4 % vaginal cream Place 1 applicator vaginally at bedtime. Use for seven days (Patient not taking: Reported on 04/28/2019)   . [DISCONTINUED] escitalopram (LEXAPRO) 10 MG tablet TAKE 1 AND 1/2 TABLETS(15 MG) BY MOUTH DAILY   . [DISCONTINUED] FLUoxetine (PROZAC) 20 MG tablet Take 1 tablet (20 mg total) by mouth daily.   . [DISCONTINUED] venlafaxine  (EFFEXOR) 37.5 MG tablet Take 1 tablet (37.5 mg total) by mouth 2 (two) times daily.    No facility-administered encounter medications on file as of 04/28/2019.   Immunization History  Administered Date(s) Administered  . Hep A / Hep B 01/18/2017, 02/20/2017, 07/19/2017  . Influenza Split 12/20/2011  . Influenza,inj,Quad PF,6+ Mos 01/18/2017, 09/17/2018  . Pneumococcal Conjugate-13 09/18/2016  . Pneumococcal Polysaccharide-23 12/20/2011, 10/30/2017     Current Diagnosis/Assessment:  Goals Addressed            This Visit's Progress   . A1c goal less than 6.5%      . Blood pressure goal less than 140/90      . Check blood sugar 3 times per week      . Complete a1c lab at next visit      . Consider completing magnesium lab at next visit      . Consider completing vitamin B12 lab at next visit      . Consider completing  vitamin D lab at next visit      . Consider stopping fish oil       Fish oil is not an appropriate alternative to atorvastatin.  Fish oil affects triglycerides more so than LDL. LDL is the "bad cholesterol" that atorvastatin keeps at goal    . Consider stopping metformin       -Pre-Diabetes a1c range is 5.7% to 6.4%. Your most recent a1c was 5.1% on 09/18/2018.  -Usually the full diabetes diagnosis is given when a1c reaches 6.5% or higher.     . LDL goal less than 100      . Pharmacy Care Plan       CARE PLAN ENTRY  Current Barriers:  . Chronic Disease Management support, education, and care coordination needs related to Pre-DM/DM, HTN, HLD, Hypothyroidism, Depression/Anxiety, Rheumatoid Arthritis, Pain, Acne, Allergic Rhinitis, Hormone Replacement, Supplementation  Pharmacist Clinical Goal(s):  [name] Over the next 90 days, polypharmacy will be reduced as evidenced by reduction in the number of supplements and/or prescription medications . A1c goal <6.5% . LDL goal <100 . BP goal <140/90 .   Interventions: . Comprehensive medication review  performed. . Consider stopping metformin since a1c has been <5.7% consistently . Check blood pressure 3 times per week . Consider stopping fish oil (this works primarily for triglycerides and they have been at goal prior to fish oil) . Complete a1c at next visit . Consider completing labs for vitamin D, B12, and magnesium at next visit  Patient Self Care Activities:  . Patient verbalizes understanding of plan to follow as described above, Self administers medications as prescribed, Calls pharmacy for medication refills, and Calls provider office for new concerns or questions  Initial goal documentation       Social Hx: Son goes to college/lives in [place] and she moved there over a year ago.  Comes back every 3 months to see other specialists.  Feels like she can't remember things.   Pre-Diabetes   Recent Relevant Labs: Lab Results  Component Value Date/Time   HGBA1C 5.1 09/18/2018 09:25 AM   HGBA1C 5.3 01/10/2018 04:41 PM   MICROALBUR 0.5 08/28/2013 10:51 AM   MICROALBUR 4.4 (H) 07/15/2013 10:00 AM     Checking BG: Rarely (when feeling symptomatic)  Patient is currently controlled on the following medications: metformin 500mg  daily  Has taken metformin before she established with Dr. Etter Sjogren  Last diabetic Foot exam: No results found for: HMDIABEYEEXA  Last diabetic Eye exam: No results found for: HMDIABFOOTEX   Reports she had hypoglycemic episode when getting her endo/colonoscopy Reports she has lost weight   We discussed: Risk/benefit of metformin continuation/discontinuation  Plan -Consider stopping metformin and seeing if BG stays within range -Check blood sugar 3 times per week -Follow up with Melvenia Beam in 4 weeks -Complete a1c at next office visit  Hypertension   CMP Latest Ref Rng & Units 09/18/2018 01/10/2018 07/19/2017  Glucose 70 - 99 mg/dL 87 87 76  BUN 6 - 23 mg/dL 10 17 10   Creatinine 0.40 - 1.20 mg/dL 0.98 1.02 0.90  Sodium 135 - 145 mEq/L 139  140 138  Potassium 3.5 - 5.1 mEq/L 3.5 4.2 3.9  Chloride 96 - 112 mEq/L 102 103 103  CO2 19 - 32 mEq/L 29 28 30   Calcium 8.4 - 10.5 mg/dL 8.8 9.6 8.6  Total Protein 6.0 - 8.3 g/dL 6.6 6.7 6.1  Total Bilirubin 0.2 - 1.2 mg/dL 0.7 0.4 0.5  Alkaline Phos 39 - 117 U/L 65  61 53  AST 0 - 37 U/L 40(H) 19 37  ALT 0 - 35 U/L 31 23 33  GFR      59.71   60.77   70.34  BP today is: Unable to assess due to phone visit  Office blood pressures are  BP Readings from Last 3 Encounters:  09/17/18 124/88  07/19/18 (!) 145/86  06/05/18 128/87    Patient has failed these meds in the past: None noted  Patient is currently controlled on the following medications: lisinopril 5mg  daily  Patient checks BP at home infrequently  Plan -Continue current medications   Hyperlipidemia   Lipid Panel     Component Value Date/Time   CHOL 146 09/18/2018 0925   TRIG 82.0 09/18/2018 0925   HDL 67.30 09/18/2018 0925   CHOLHDL 2 09/18/2018 0925   VLDL 16.4 09/18/2018 0925   LDLCALC 62 09/18/2018 0925     The 10-year ASCVD risk score (Goff DC Jr., et al., 2013) is: 4.6%   Values used to calculate the score:     Age: 45 years     Sex: Female     Is Non-Hispanic African American: No     Diabetic: Yes     Tobacco smoker: No     Systolic Blood Pressure: 0000000 mmHg     Is BP treated: Yes     HDL Cholesterol: 73 mg/dL     Total Cholesterol: 266 mg/dL   Patient has failed these meds in past: simvastatin (rash) Patient is currently controlled on the following medications: atorvastatin 10mg  daily, Fish oil 1250mg  daily  Reports that she was only been taking atorvastatin every other Shekia Kuper  Does not think she was taking fish oil during the August labs She just purchased new bottle of fish oil and wonders if she needs to take it  We discussed:  Risk/benefit of fish oil noting that fish oil is not an appropriate replacement for atorvastatin  Plan -Consider stopping fish oil when you complete current  bottle -Continue current medications  Hypothyroidism   TSH  Date Value Ref Range Status  09/18/2018 1.67 0.35 - 4.50 uIU/mL Final     Patient has failed these meds in past: None noted  Patient is currently controlled on the following medications: levothyroxine 71mcg daily  We discussed:  Administration time of levothyroxine noting that it should not be taken within 4 hours of calcium or iron containing supplements  Plan -Continue current medications  Depression/Anxiety    Patient has failed these meds in past: fetzima (cost), effexor (never started?) Patient is currently controlled on the following medications: bupropion XL 300mg  daily, buspirone 10mg  TID, lamictal 100mg  AM, 200mg  HS  Followed by Psych Wishes she had more relief. Considering therapy  Plan -Continue current medications   Rheumatoid Arthritis     Patient has failed these meds in past: methotrexate,leflunomide (elevated liver enzymes), sulfasalazine (headaches), hydroxychloroquine (inefficacy), adalimumab/Humira (reactions: muscle spasm, numb lips, itching) Patient is currently stable on the following medications: sarilumab/Kevzara 200mg  every 2 weeks, naproxen 220mg    Followed by Rheum  Updated med list to reflect that she is no longer taking Enbrel and now taking Albee -Continue current medications   Pain    Patient has failed these meds in past: None noted  Patient is currently stable on the following medications: cyclobenzaprine 5mg , gabapentin 300mg  1AM + 3 caps HS, oxycodone 10mg  Q6HPRN  Pain Scale With oxy: 6.5-7.5 Without oxy: 10  Cyclobenzaprine: Uses 1/2 tablet about once weekly Gabapentin:  If she doesn't take she has nightmares Naproxen 220mg  #2 BID Does not get consistent sleep  Plan -Continue current medications   Acne    Patient has failed these meds in past: doxycycline (dizziness and low BP) Patient is currently controlled on the following medications: tretinoin  0.025% gel nightly, metronidazole 0.75% gel once daily  Followed by derm  Plan -Continue current medications   Allergic Rhinitis     Patient has failed these meds in past: None noted  Patient is currently controlled on the following medications: cetirizine 10mg  daily, Tylenol Sinus congestion PRN  Plan -Continue current medications     Hormone Replacement (pre/peri menopausal)    Patient has failed these meds in past: None noted  Patient is currently controlled on the following medications: Estradiol 0.05mg /24Hr taking twice weekly, mirena IUD (has tried to D/C mirena, but had significant bleeding)  Plan -Continue current medications  Supplementation    Patient has failed these meds in past: None noted  Patient is currently on the following medications:  Cholecalciferol 2000 units EOD, 4000 units EOD (07/28/16 Vitamin D, 25 hydroxy = 57) Vitamin B12 5032mcg daily (01/28/16 Vitamin B12 = 1425) Vitamin B6 100mg  daily (will soon be switching to b complex vitamin to replace B6 and B12) Magnesium 250mg  daily (pt would like to have this lab drawn)   We discussed:  Risk/Benefit of supplementation  Plan -Consider completing labs for B12, Vitamin D, 25 hydroxy, and Magnesium -Continue current medications     Miscellaneous Meds lotrisone BID Biotin 5082mcg daily L-glutamine 500mg  once daily

## 2019-04-28 NOTE — Patient Instructions (Addendum)
Visit Information  Goals Addressed            This Visit's Progress   . A1c goal less than 6.5%      . Blood pressure goal less than 140/90      . Check blood sugar 3 times per week      . Complete a1c lab at next visit      . Consider completing magnesium lab at next visit      . Consider completing vitamin B12 lab at next visit      . Consider completing vitamin D lab at next visit      . Consider stopping fish oil       Fish oil is not an appropriate alternative to atorvastatin.  Fish oil affects triglycerides more so than LDL. LDL is the "bad cholesterol" that atorvastatin keeps at goal    . Consider stopping metformin       -Pre-Diabetes a1c range is 5.7% to 6.4%. Your most recent a1c was 5.1% on 09/18/2018.  -Usually the full diabetes diagnosis is given when a1c reaches 6.5% or higher.     . LDL goal less than 100      . Pharmacy Care Plan       CARE PLAN ENTRY  Current Barriers:  . Chronic Disease Management support, education, and care coordination needs related to Pre-DM/DM, HTN, HLD, Hypothyroidism, Depression/Anxiety, Rheumatoid Arthritis, Pain, Acne, Allergic Rhinitis, Hormone Replacement, Supplementation  Pharmacist Clinical Goal(s):  [name] Over the next 90 days, polypharmacy will be reduced as evidenced by reduction in the number of supplements and/or prescription medications . A1c goal <6.5% . LDL goal <100 . BP goal <140/90 .   Interventions: . Comprehensive medication review performed. . Consider stopping metformin since a1c has been <5.7% consistently . Check blood pressure 3 times per week . Consider stopping fish oil (this works primarily for triglycerides and they have been at goal prior to fish oil) . Complete a1c at next visit . Consider completing labs for vitamin D, B12, and magnesium at next visit  Patient Self Care Activities:  . Patient verbalizes understanding of plan to follow as described above, Self administers medications as prescribed, Calls  pharmacy for medication refills, and Calls provider office for new concerns or questions  Initial goal documentation        Michelle Mueller was given information about Chronic Care Management services today including:  1. CCM service includes personalized support from designated clinical staff supervised by her physician, including individualized plan of care and coordination with other care providers 2. 24/7 contact phone numbers for assistance for urgent and routine care needs. 3. Standard insurance, coinsurance, copays and deductibles apply for chronic care management only during months in which we provide at least 20 minutes of these services. Most insurances cover these services at 100%, however patients may be responsible for any copay, coinsurance and/or deductible if applicable. This service may help you avoid the need for more expensive face-to-face services. 4. Only one practitioner may furnish and bill the service in a calendar month. 5. The patient may stop CCM services at any time (effective at the end of the month) by phone call to the office staff.  Patient agreed to services and verbal consent obtained.   The patient verbalized understanding of instructions provided today and agreed to receive a mailed copy of patient instruction and/or educational materials. Telephone follow up appointment with pharmacy team member scheduled for: 05/26/2019  Michelle Mueller, PharmD Clinical Pharmacist [facility] Following a diabetes action plan is a way for you to manage your diabetes (diabetes mellitus) symptoms. The plan is color-coded to help you understand what actions you need to take based on any symptoms you are having.  If you have symptoms in the red zone, you need medical care right away.  If you have symptoms in the yellow zone, it means you are having problems.  If you have symptoms in the green zone, you are  doing well. Learning about and understanding diabetes can take time. Follow the plan that you develop with your health care provider. Know the target range for your blood sugar (glucose) level, and review your treatment plan with your health care provider at each visit. The target range for my blood sugar level is 150 mg/dL.  Red zone Get medical help right away if you have any of the following symptoms:  A blood sugar test result that is below 54 mg/dL (3 mmol/L).  A blood sugar test result that is at or above 240 mg/dL (13.3 mmol/L) for 2 days in a row.  Confusion or trouble thinking clearly.  Difficulty breathing.  Sickness or a fever for 2 or more days that is not getting better.  Moderate or large ketone levels in your urine. If you have any red zone symptoms, call emergency services (911 in the [place] or go to the nearest emergency room. If you have severely low blood sugar (severe hypoglycemia) and you cannot eat or drink, you may need an injection of glucagon. Make sure a family member or close friend knows how to check your blood sugar and how to give you a glucagon injection. You may need to be treated in a hospital for this condition. Yellow zone If you have any of the following symptoms, your diabetes is not under control and you may need to make some changes:  Blood sugar test results that are below 70 mg/dL (3.9 mmol/L).  Other symptoms of hypoglycemia, such as: ? Shaking or feeling light-headed. ? Confusion or irritability. ? Feeling hungry. ? Having a fast heartbeat.  A blood sugar test result that is higher than 240 mg/dL (13.3 mmol/L) for 2 days in a row.  A fever.  Feeling tired, or not having any energy. If you have any yellow zone symptoms:  Treat your low blood sugar (hypoglycemia) by eating or drinking 15 grams of a rapid-acting carbohydrate. Follow the 15:15 rule: ? Take 15 grams of a rapid-acting carbohydrate, such as:  1 tube of glucose gel.  3  glucose pills.  6-8 pieces of hard candy.  4 oz (120 mL) of fruit juice.  4 oz (120 mL) of regular (not diet) soda. ? Check your blood sugar 15 minutes after you take the carbohydrate. ? If the repeat blood sugar test is still at or below 70 mg/dL (3.9 mmol/L), take 15 grams of a carbohydrate again. ? If your blood sugar does not increase above 70 mg/dL (3.9 mmol/L) after 3 tries, get medical help right away. ? After your blood sugar returns to normal, eat a meal or a snack within 1 hour.  Keep taking your daily medicines as directed.  Check your blood sugar more often than you normally would. ? Write down your results. ? Call your health care provider if you have trouble keeping your blood sugar in your target range.  Green zone These signs mean you are doing well and you can  continue what you are doing to manage your diabetes:  Your blood sugar is within your personal target range. For most people, a blood sugar level before a meal (preprandial) should be 80-130 mg/dL.  You feel well, and you are able to do daily activities. If you are in the green zone, continue to manage your diabetes as directed. To do this:  Eat a healthy diet.  Exercise regularly.  Check your blood sugar as directed.  Take your medicines as directed.  Where to find more information You can find more information about diabetes from:  American Diabetes Association (ADA): www.diabetes.org  American Association of Diabetes Educators (AADE): www.diabeteseducator.org Summary  Following a diabetes action plan is a way for you to manage your diabetes symptoms. The plan is color-coded to help you understand what actions you need to take based on any symptoms you are having.  Follow the plan that you develop with your health care provider. Make sure you know your personal target blood sugar level.  Review your treatment plan with your health care provider at each visit. This information is not intended to  replace advice given to you by your health care provider. Make sure you discuss any questions you have with your health care provider. Document Revised: 03/16/2017 Document Reviewed: 11/08/2016 Elsevier Patient Education  2020 [employer].

## 2019-05-26 ENCOUNTER — Telehealth: Payer: Self-pay

## 2019-05-29 ENCOUNTER — Other Ambulatory Visit (HOSPITAL_COMMUNITY): Payer: Self-pay

## 2019-05-29 MED ORDER — BUSPIRONE HCL 10 MG PO TABS: ORAL_TABLET | ORAL | 0 refills | Status: DC

## 2019-05-29 MED ORDER — LAMOTRIGINE 200 MG PO TABS: ORAL_TABLET | ORAL | 0 refills | Status: DC

## 2019-05-29 MED ORDER — LAMOTRIGINE 100 MG PO TABS: 100.0000 mg | ORAL_TABLET | Freq: Every morning | ORAL | 0 refills | Status: DC

## 2019-06-04 ENCOUNTER — Other Ambulatory Visit: Payer: Self-pay | Admitting: Obstetrics & Gynecology

## 2019-06-04 ENCOUNTER — Telehealth: Payer: Self-pay

## 2019-06-04 DIAGNOSIS — N951 Menopausal and female climacteric states: Secondary | ICD-10-CM

## 2019-06-04 MED ORDER — ESTRADIOL 0.05 MG/24HR TD PTWK: 0.0500 mg | MEDICATED_PATCH | TRANSDERMAL | 0 refills | Status: AC

## 2019-06-04 NOTE — Telephone Encounter (Addendum)
Pt requesting 3 month supply of Estradiol (Climara) patches. I spoke with Dr.Leggett and she is okay with this refill considering pt has Mirena that does not expire until 12/2019, BP is well controlled on Lisinopril and pt has no abnormal bleeding or other complaints. Pt is aware she will need an annual appt in order to get more refills.

## 2019-06-05 IMAGING — US ULTRASOUND LEFT BREAST LIMITED
1 series · 5 of 5 positions shown · non-contrast
Comparison: Mammography 07/06/2016 (left), 12/20/2015 (left),
12/13/2015 (bilateral) and earlier.

ADDENDUM:
The patient returned for stereotactic biopsy on 01/19/2017. Despite
multiple attempts at repositioning, the area of vague distortion in
the superior left breast could not be well established for
localization. Therefore, the biopsy was canceled.

Again, the patient has a history of lipoma removal along the lateral
left breast many years ago, though a definitive overlying scar could
not be identified.
Given that no targetable lesion was identified today, recommendation
is for the patient to return for follow-up left breast mammogram in
6 months. Of note, the patient is also being followed for a probably
benign mass within the left breast at the 11 o'clock position
sonographically.
CLINICAL DATA: One year interval follow-up of a likely benign focal
asymmetry in the upper left breast at far posterior depth, felt to
represent overlapping fibroglandular tissue and a normal low
axillary lymph node. A hypoechoic focus in the upper inner left
breast on ultrasound is being followed which may correspond to the
focal asymmetry. Annual evaluation, right breast.
Patient states she had a benign lipoma removed from what she
believed was the left breast many years ago while in [place]
though she is unsure.
EXAM:
2D DIGITAL DIAGNOSTIC BILATERAL MAMMOGRAM WITH CAD AND ADJUNCT TOMO
ULTRASOUND LEFT BREAST

[Series 1: ultrasound left breast limited · 0.06mm/px · 5 of 5 slices shown]
[im 1/5]
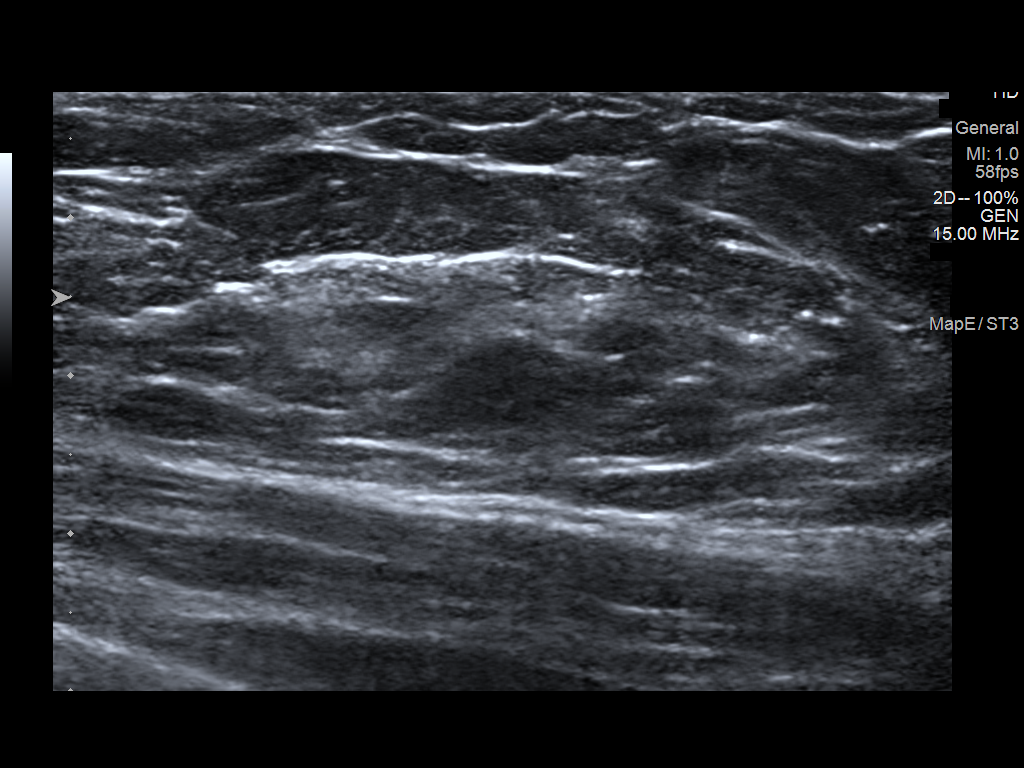
[im 2/5]
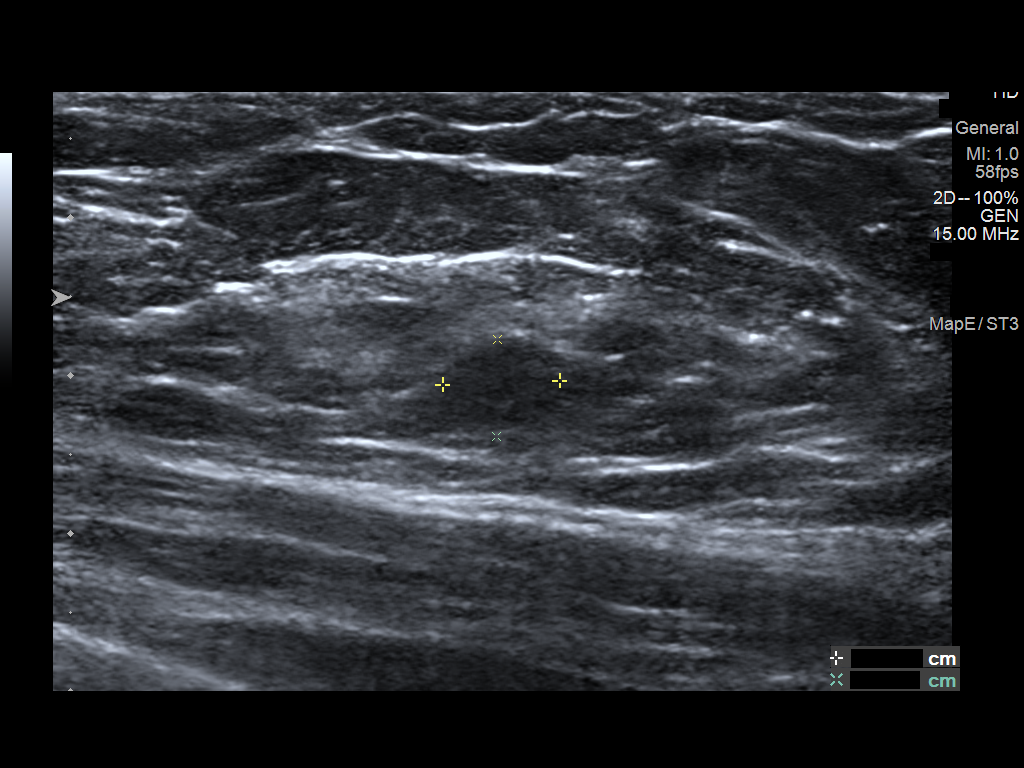
[im 3/5]
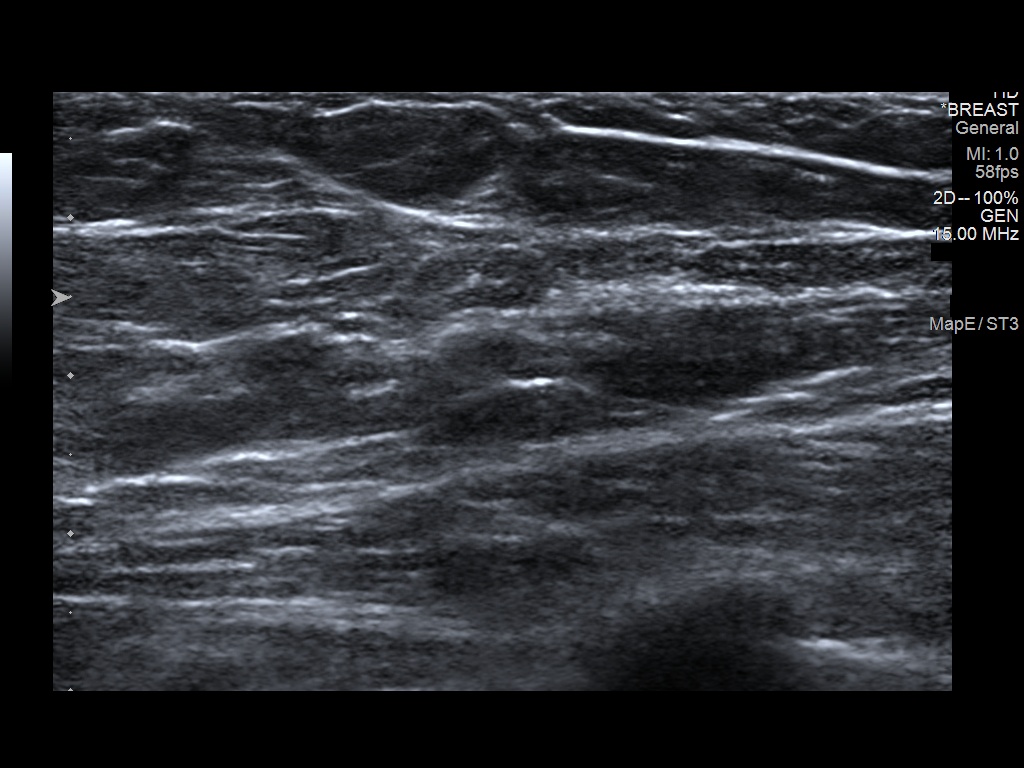
[im 4/5]
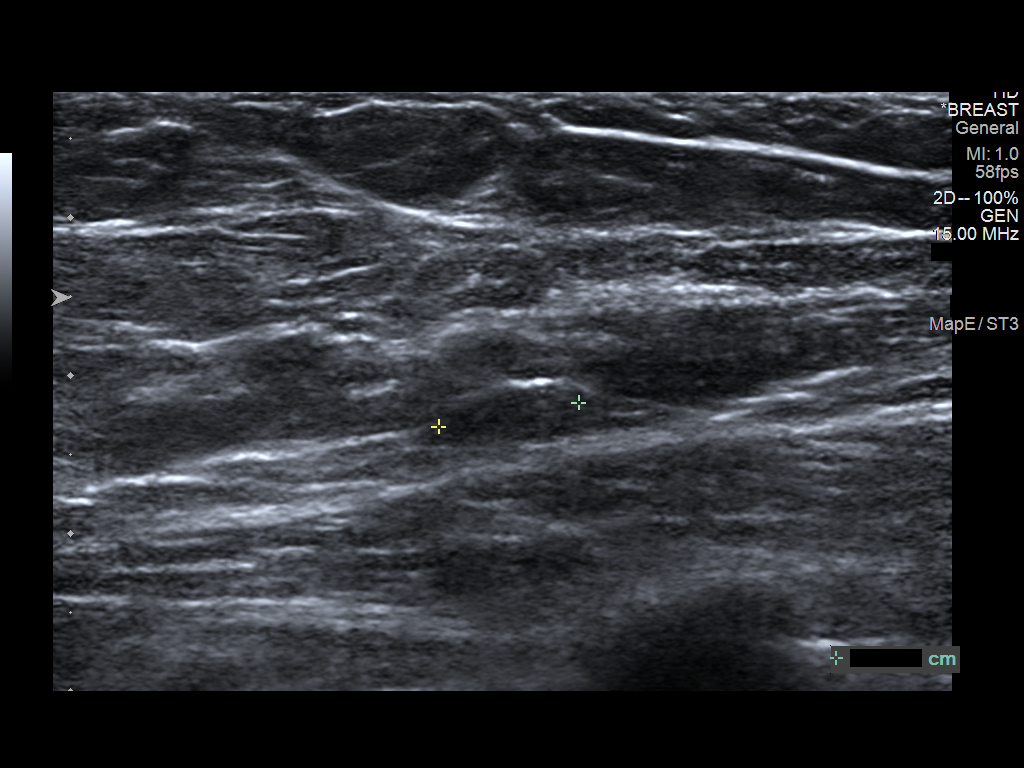
[im 5/5]
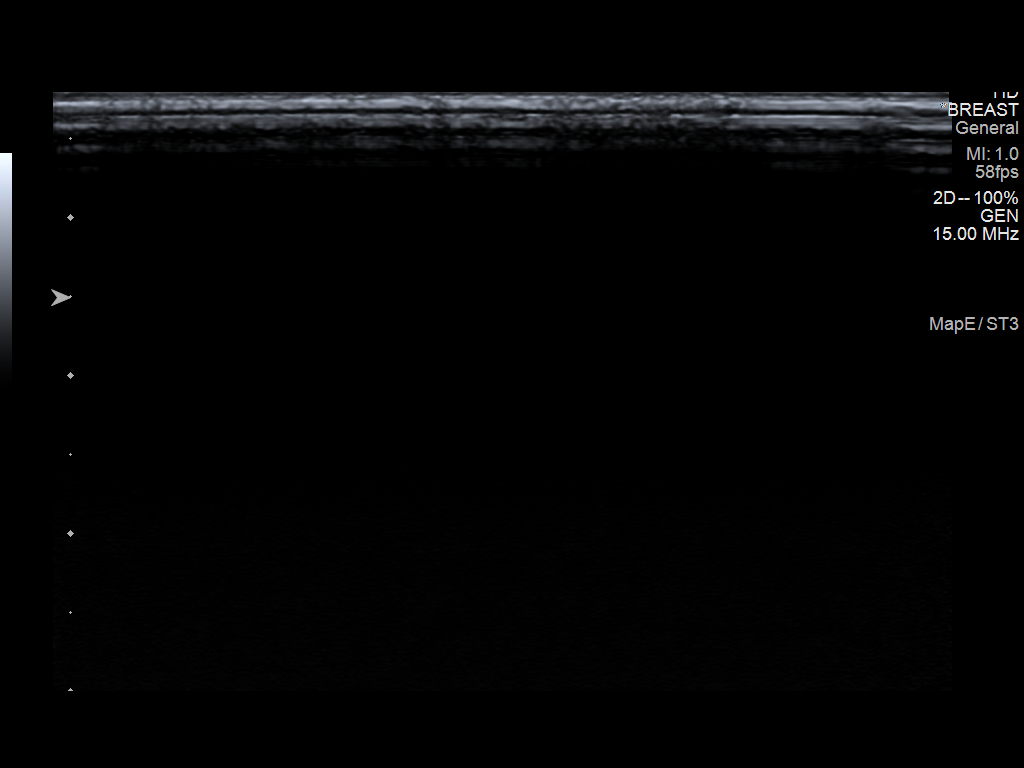

[5 of 5 positions shown; findings below may reference images not displayed]

Left breast ultrasound
07/06/2016, 12/20/2015.

ACR Breast Density Category b: There are scattered areas of
fibroglandular density.
FINDINGS: Standard 2D and tomosynthesis full field CC and MLO views of both
breasts were obtained. A standard and tomosynthesis spot-compression
CC and MLO view of a mammographic finding in the left breast were
also obtained.

The focal asymmetry in the retroareolar left breast at far posterior
depth, shown previously to likely represent overlapping
fibroglandular tissue and a normal low left axillary lymph node, is
unchanged.

Spot compression views of the upper outer quadrant of the left
breast at posterior depth confirm architectural distortion vaguely
visible on the full field tomosynthesis images, which was not
clearly visible on prior examinations. There is no associated mass
or suspicious calcifications.

No mammographic evidence of malignancy involving the right breast.

Mammographic images were processed with CAD.

On physical exam, there is no palpable abnormality in the upper
inner or upper outer left breast. I do not identify a definite scar
on the skin of the left breast.

Targeted left breast ultrasound is performed, showing no correlate
for the architectural distortion involving the upper outer quadrant
of the left breast.

The vague hypoechoic focus at the 11 o'clock position approximately
8 cm from the nipple is again demonstrated, measuring approximately
6 x 7 x 9 mm, unchanged dating back to the December 2015
ultrasound.
IMPRESSION: 1. Architectural distortion involving the upper outer quadrant of
the left breast at posterior depth without sonographic correlate.
The patient states that she had a lipoma removed from what she
thought was the left breast many years ago, though I do not identify
a scar on the skin of the upper outer left breast in this location
to confirm the prior surgery.
2. Stable likely benign focal asymmetry involving the upper inner
quadrant of the left breast, including a hypoechoic focus on
ultrasound which is the likely sonographic correlate.
3. No mammographic evidence of malignancy involving the right
breast.

RECOMMENDATION:
Stereotactic tomosynthesis core needle biopsy of the architectural
distortion involving the upper outer quadrant of the left breast.

The stereotactic needle biopsy procedure was discussed with the
patient and her questions were answered. She has agreed to proceed
and the biopsy has been scheduled.

I have discussed the findings and recommendations with the patient.
Results were also provided in writing at the conclusion of the
visit.

BI-RADS CATEGORY  4: Suspicious.

## 2019-06-09 ENCOUNTER — Encounter: Payer: Self-pay | Admitting: *Deleted

## 2019-06-16 ENCOUNTER — Other Ambulatory Visit (HOSPITAL_COMMUNITY): Payer: Self-pay | Admitting: Psychiatry

## 2019-06-26 ENCOUNTER — Telehealth (HOSPITAL_COMMUNITY): Payer: Medicare PPO | Admitting: Psychiatry

## 2019-06-26 ENCOUNTER — Other Ambulatory Visit: Payer: Self-pay

## 2019-07-03 ENCOUNTER — Telehealth (INDEPENDENT_AMBULATORY_CARE_PROVIDER_SITE_OTHER): Payer: Medicare PPO | Admitting: Psychiatry

## 2019-07-03 ENCOUNTER — Encounter (HOSPITAL_COMMUNITY): Payer: Self-pay | Admitting: Psychiatry

## 2019-07-03 DIAGNOSIS — G8929 Other chronic pain: Secondary | ICD-10-CM

## 2019-07-03 DIAGNOSIS — M549 Dorsalgia, unspecified: Secondary | ICD-10-CM | POA: Diagnosis not present

## 2019-07-03 DIAGNOSIS — F411 Generalized anxiety disorder: Secondary | ICD-10-CM | POA: Diagnosis not present

## 2019-07-03 DIAGNOSIS — F331 Major depressive disorder, recurrent, moderate: Secondary | ICD-10-CM | POA: Diagnosis not present

## 2019-07-03 NOTE — Progress Notes (Addendum)
Patient ID: Michelle Mueller, female   DOB: 09/06/1965, 54 y.o.   MRN: [id number]  Psychiatric Outpatient Follow up Visit  Patient Identification: Michelle Mueller MRN:  [id number] Date of Evaluation:  07/03/2019 Referral Source: [name] Chief Complaint:    depression . Follow up Visit Diagnosis: MDD. GAD . Mood related to Chronic back pain Diagnosis:   Patient Active Problem List   Diagnosis Date Noted   Influenza [J11.1] 02/28/2018   Pansinusitis [J32.4] 02/28/2018   Rash [R21] 01/13/2018   Hypothyroidism [E03.9] 01/13/2018   Hyperlipidemia associated with type 2 diabetes mellitus ([place]) [E11.69, E78.5] 07/30/2016   Rheumatoid arthritis of multiple sites with negative rheumatoid factor ([place]) [M06.09] 03/13/2016   Chest pain of uncertain etiology [id number] 12/21/2014   Essential hypertension [I10] 12/21/2014   Myalgia [M79.10] 11/10/2014   Insomnia [G47.00] 11/10/2014   UTI (urinary tract infection) [N39.0] 08/04/2013   Fibromyalgia muscle pain [M79.7] 06/13/2013   Severe depression ([place]) [F32.2] 06/13/2013   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, without long-term current use of insulin ([place]) [E11.620] 06/13/2013   Rheumatoid arthritis ([place]) [M06.9] 03/19/2013   Fibromyalgia [M79.7] 03/19/2013   Unspecified hypothyroidism [E03.9] 03/19/2013   Goiter [E04.9] 03/19/2013   Obesity (BMI 30-39.9) [E66.9] 03/19/2013   Pseudoarthrosis of lumbar spine L4-L5 [S32.009K] 06/20/2012   Family history of pancreatic cancer [Z80.0] 01/18/2011   Chronic diarrhea [K52.9] 01/18/2011    I connected with Michelle Mueller on 07/03/19 at  3:30 PM EDT by a video enabled telemedicine application and verified that I am speaking with the correct person using two identifiers.    I discussed the limitations of evaluation and management by telemedicine and the availability of in person appointments. The patient expressed understanding and agreed to  proceed.  Location Patient: home virtual appointment Provider: home  Past meds : fetzima was expensive, started effexor, didn't start.    Has moved now an hour away from her son and friends havent had time to look for DBT therapy  Pain and tiredness still effects mood, mobility   She has made some friends She continues with lamictal, wellbutrin  Modifying factor: kids,   Timing:morning and when in pain  associated symptoms: back pain  Past Medical History:  Past Medical History:  Diagnosis Date   Anxiety    Arthritis    Asthma    last attack 3 yrs ago   Chicken pox    Depression    Diabetes mellitus without complication ([place])    Fibromyalgia    Goiter    Hypertension    [name]   Hypothyroidism    Ovarian cyst    PONV (postoperative nausea and vomiting)     Past Surgical History:  Procedure Laterality Date   ABDOMINAL EXPOSURE  12/18/2011   Procedure: ABDOMINAL EXPOSURE;  Surgeon: [name], MD;  Location: [facility];  Service: Vascular;  Laterality: N/A;  Anterior Expossure for anterior lumbar interbody fuson   ANTERIOR LUMBAR FUSION  12/18/2011   Procedure: ANTERIOR LUMBAR FUSION 1 LEVEL;  Surgeon: [name], MD;  Location: [facility];  Service: Neurosurgery;  Laterality: N/A;  Lumbar four-five Anterior Lumbar Interbody Fusion with Dr. [name] to do anterior exposure   [place]     L breast   TONSILLECTOMY     TUMOR EXCISION     Nerve sheath tumor, R arm   Family  History:  Family History  Problem Relation Age of Onset   Heart disease Father    Lung cancer Father    Colon cancer Maternal Aunt    Pancreatic cancer Maternal Grandmother    Pancreatic cancer Maternal Grandfather    Pancreatic cancer Paternal Grandmother    Cancer Paternal Grandfather    Sexual abuse Neg Hx    Social History:   Social History   Socioeconomic History   Marital status:  Divorced    Spouse name: Not on file   Number of children: Not on file   Years of education: Not on file   Highest education level: Not on file  Occupational History   Not on file  Tobacco Use   Smoking status: Never Smoker   Smokeless tobacco: Never Used  Substance and Sexual Activity   Alcohol use: No    Alcohol/week: 0.0 standard drinks   Drug use: No   Sexual activity: Not Currently    Birth control/protection: I.U.D., None  Other Topics Concern   Not on file  Social History Narrative   Not on file   Social Determinants of Health   Financial Resource Strain:    Difficulty of Paying Living Expenses:   Food Insecurity:    Worried About Charity fundraiser in the Last Year:    Arboriculturist in the Last Year:   Transportation Needs:    Film/video editor (Medical):    Lack of Transportation (Non-Medical):   Physical Activity:    Days of Exercise per Week:    Minutes of Exercise per Session:   Stress:    Feeling of Stress :   Social Connections:    Frequency of Communication with Friends and Family:    Frequency of Social Gatherings with Friends and Family:    Attends Religious Services:    Active Member of Clubs or Organizations:    Attends Archivist Meetings:    Marital Status:       Psychiatric Specialty Exam:    Depression        Associated symptoms include does not have insomnia and no suicidal ideas.   Review of Systems  Cardiovascular: Negative for chest pain.  Musculoskeletal: Positive for joint pain.  Psychiatric/Behavioral: Positive for depression. Negative for substance abuse and suicidal ideas. The patient does not have insomnia.     There were no vitals taken for this visit.There is no height or weight on file to calculate BMI.  General Appearance: fair  Eye Contact:  Good  Speech:  Clear and Coherent  Volume:  Normal  Mood: somewhat subdued  Affect: congruent  Thought Process:  Coherent, Goal  Directed, Intact, Linear and Logical  Orientation:  Full (Time, Place, and Person)  Thought Content:  WDL  Suicidal Thoughts:  No  Homicidal Thoughts:  No  Memory:  Immediate;   Good Recent;   Good Remote;   Good  Judgement:  Good  Insight:  Present  Psychomotor Activity:  Decreased  Concentration:  Fair  Recall:  Good  Fund of Knowledge:Good  Language: Good  Akathisia:  No  Handed:  Left  AIMS (if indicated):    Assets:  Communication Skills Desire for Improvement Financial Resources/Insurance Housing Resilience Transportation  ADL's:  Intact  Cognition: WNL  Sleep:  poor    Allergies:   Allergies  Allergen Reactions   Erythromycin Other (See Comments)    arrhythmia   Zocor  [Simvastatin-High Dose] Rash   Doxycycline Other (See Comments)  Decreased BP and caused dizziness per patient   Peroxide [Hydrogen Peroxide]    Current Medications: Current Outpatient Medications  Medication Sig Dispense Refill   atorvastatin (LIPITOR) 10 MG tablet TAKE 1 TABLET(10 MG) BY MOUTH DAILY 90 tablet 1   buPROPion (WELLBUTRIN XL) 300 MG 24 hr tablet TAKE 1 TABLET BY MOUTH EVERY DAY 90 tablet 0   busPIRone (BUSPAR) 10 MG tablet TAKE 1 TABLET BY MOUTH THREE TIMES DAILY 180 tablet 0   Calcium-Magnesium-Vitamin D (CALCIUM MAGNESIUM PO) Take 1 tablet by mouth daily.     cetirizine (ZYRTEC) 10 MG tablet Take 10 mg by mouth daily.      Cholecalciferol (VITAMIN D) 2000 units CAPS Take 1 capsule by mouth daily.     clotrimazole-betamethasone (LOTRISONE) cream Apply 1 application topically 2 (two) times daily. 30 g 0   cyclobenzaprine (FLEXERIL) 5 MG tablet   1   estradiol (CLIMARA) 0.05 mg/24hr patch Place 1 patch (0.05 mg total) onto the skin once a week. Twice weekly 24 patch 0   etanercept (ENBREL) 50 MG/ML injection Inject into the skin.     fluconazole (DIFLUCAN) 150 MG tablet 1 po x1, may repeat in 3 days prn (Patient not taking: Reported on 04/28/2019) 2 tablet 0    gabapentin (NEURONTIN) 300 MG capsule Take 3 capsules by mouth at bedtime. Takes 1AM + 4HS     glucose blood (ONE TOUCH ULTRA TEST) test strip Test blood sugar once daily. Dx code: 250.00 100 each 12   L-Glutamine 500 MG TABS Take 1 tablet by mouth daily.     Lactobacillus (ACIDOPHILUS PO) Take 1 capsule by mouth daily. 2 Billion active cultures     lamoTRIgine (LAMICTAL) 100 MG tablet Take 1 tablet (100 mg total) by mouth every morning. This is in addition to 200mg . Total dose now 300mg  per day 90 tablet 0   lamoTRIgine (LAMICTAL) 200 MG tablet Take one tablet at bedtime. 90 tablet 0   levofloxacin (LEVAQUIN) 500 MG tablet Take 1 tablet (500 mg total) by mouth daily. (Patient not taking: Reported on 04/28/2019) 7 tablet 0   levonorgestrel (MIRENA) 20 MCG/24HR IUD 1 each by Intrauterine route once.     levothyroxine (SYNTHROID) 50 MCG tablet Take 1 tablet (50 mcg total) by mouth daily before breakfast. 90 tablet 1   lisinopril (ZESTRIL) 5 MG tablet Take 1 tablet (5 mg total) by mouth daily. 90 tablet 1   metFORMIN (GLUCOPHAGE-XR) 500 MG 24 hr tablet TAKE 1 TABLET(500 MG) BY MOUTH EVERY EVENING 90 tablet 1   Multiple Vitamin (MULTIVITAMIN WITH MINERALS) TABS Take 1 tablet by mouth daily.     mupirocin ointment (BACTROBAN) 2 % APPLY TO LESION ON LEFT BREAST TWICE DAILY FOR 14 DAYS  0   naproxen sodium (ANAPROX) 220 MG tablet Take 220 mg by mouth.     ONETOUCH DELICA LANCETS 99991111 MISC Test blood sugar once daily. Dx code: 250.00 100 each 12   OVER THE COUNTER MEDICATION Vitamin B-12 5000 mcg 1 tablet daily.     OVER THE COUNTER MEDICATION Amberen 2 tablets every morning     oxyCODONE 10 MG TABS Take 1 tablet (10 mg total) by mouth every 6 (six) hours as needed (for pain). 60 tablet 0   Phenylephrine-Acetaminophen (TYLENOL SINUS CONGESTION/PAIN PO) Take 2 tablets by mouth daily as needed (for sinus congestion).      Sarilumab (KEVZARA) 200 MG/1.14ML SOAJ Inject 200 mg into the skin.  Every 2 weeks     terconazole (  TERAZOL 7) 0.4 % vaginal cream Place 1 applicator vaginally at bedtime. Use for seven days (Patient not taking: Reported on 04/28/2019) 45 g 0   tretinoin (RETIN-A) 0.025 % gel Apply 3 nights weekly x 2 weeks can increase to nightly only as tolerated     No current facility-administered medications for this visit.     Treatment Plan Summary: Medication management  1. Depression::somewhat subdued, continue wellbutrin, lamictal Planning for DBT therapy for depression and coping skills She feels meds are doing what they can would add therapy  2. Anxiety ; fluctuates, continue buspar  3. Insomnia: worsens when in pain. Reviewed sleep hygiene 4. Back pain:chronic. Working with providers and symptomatic management. FU for RA    Add more Me time and activitiy time to distract from dysporia  I discussed the assessment and treatment plan with the patient. The patient was provided an opportunity to ask questions and all were answered. The patient agreed with the plan and demonstrated an understanding of the instructions.   The patient was advised to call back or seek an in-person evaluation if the symptoms worsen or if the condition fails to improve as anticipated. FU 1-59m.  Non face to face time spent: 15 min Merian Capron, MD  3:44 PM 07/03/2019

## 2019-07-22 ENCOUNTER — Other Ambulatory Visit: Payer: Self-pay | Admitting: Family Medicine

## 2019-07-22 DIAGNOSIS — Z1231 Encounter for screening mammogram for malignant neoplasm of breast: Secondary | ICD-10-CM

## 2019-08-26 ENCOUNTER — Other Ambulatory Visit (HOSPITAL_COMMUNITY): Payer: Self-pay | Admitting: Psychiatry

## 2019-09-15 ENCOUNTER — Other Ambulatory Visit (HOSPITAL_COMMUNITY): Payer: Self-pay

## 2019-09-15 MED ORDER — BUPROPION HCL ER (XL) 300 MG PO TB24: ORAL_TABLET | ORAL | 0 refills | Status: DC

## 2019-09-29 ENCOUNTER — Encounter (HOSPITAL_COMMUNITY): Payer: Self-pay | Admitting: Psychiatry

## 2019-09-29 ENCOUNTER — Telehealth (INDEPENDENT_AMBULATORY_CARE_PROVIDER_SITE_OTHER): Payer: Medicare PPO | Admitting: Psychiatry

## 2019-09-29 DIAGNOSIS — F411 Generalized anxiety disorder: Secondary | ICD-10-CM | POA: Diagnosis not present

## 2019-09-29 DIAGNOSIS — G8929 Other chronic pain: Secondary | ICD-10-CM

## 2019-09-29 DIAGNOSIS — M549 Dorsalgia, unspecified: Secondary | ICD-10-CM | POA: Diagnosis not present

## 2019-09-29 DIAGNOSIS — F331 Major depressive disorder, recurrent, moderate: Secondary | ICD-10-CM

## 2019-09-29 NOTE — Progress Notes (Signed)
Patient ID: Michelle Mueller, female   DOB: 1965/07/27, 54 y.o.   MRN: [id number]  Psychiatric Outpatient Follow up Visit  Patient Identification: Michelle Mueller MRN:  [id number] Date of Evaluation:  09/29/2019 Referral Source: [name] Chief Complaint:    depression . Follow up Visit Diagnosis: MDD. GAD . Mood related to Chronic back pain Diagnosis:   Patient Active Problem List   Diagnosis Date Noted  . Influenza [J11.1] 02/28/2018  . Pansinusitis [J32.4] 02/28/2018  . Rash [R21] 01/13/2018  . Hypothyroidism [E03.9] 01/13/2018  . Hyperlipidemia associated with type 2 diabetes mellitus ([place]) [E11.69, E78.5] 07/30/2016  . Rheumatoid arthritis of multiple sites with negative rheumatoid factor ([place]) [M06.09] 03/13/2016  . Chest pain of uncertain etiology [N86.7] 12/21/2014  . Essential hypertension [I10] 12/21/2014  . Myalgia [M79.10] 11/10/2014  . Insomnia [G47.00] 11/10/2014  . UTI (urinary tract infection) [N39.0] 08/04/2013  . Fibromyalgia muscle pain [M79.7] 06/13/2013  . Severe depression ([place]) [F32.2] 06/13/2013  . 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 Rheumatoid arthritis ([place]) [M06.9] 03/19/2013  . Fibromyalgia [M79.7] 03/19/2013  . Unspecified hypothyroidism [E03.9] 03/19/2013  . Goiter [E04.9] 03/19/2013  . Obesity (BMI 30-39.9) [E66.9] 03/19/2013  . 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 Family history of pancreatic cancer [Z80.0] 01/18/2011  . Chronic diarrhea [K52.9] 01/18/2011      I connected with Michelle Mueller on 09/29/19 at  3:45 PM EDT by a video enabled telemedicine application and verified that I am speaking with the correct person using two identifiers. I discussed the limitations of evaluation and management by telemedicine and the availability of in person appointments. The patient expressed understanding and agreed to  proceed.  Location Patient: home virtual appointment Provider: home office  Doing fair but pain can effect mood, goes to beach when gets some motivation. Avara med started for arthritis,    She has made some friends She continues with lamictal, wellbutrin  Modifying factor: kids   Timing:morning and when in pain  associated symptoms: back pain  Past Medical History:  Past Medical History:  Diagnosis Date  . Anxiety   . Arthritis   . Asthma    last attack 3 yrs ago  . Chicken pox   . Depression   . Diabetes mellitus without complication ([place])   . Fibromyalgia   . Goiter   . Hypertension    [name]  . Hypothyroidism   . Ovarian cyst   . PONV (postoperative nausea and vomiting)     Past Surgical History:  Procedure Laterality Date  . ABDOMINAL EXPOSURE  12/18/2011   Procedure: ABDOMINAL EXPOSURE;  Surgeon: [name], MD;  Location: [facility];  Service: Vascular;  Laterality: N/A;  Anterior Expossure for anterior lumbar interbody fuson  . ANTERIOR LUMBAR FUSION  12/18/2011   Procedure: ANTERIOR LUMBAR FUSION 1 LEVEL;  Surgeon: [name], MD;  Location: [facility];  Service: Neurosurgery;  Laterality: N/A;  Lumbar four-five Anterior Lumbar Interbody Fusion with Dr. [name] to do anterior exposure  . CESAREAN SECTION    . CHOLECYSTECTOMY    . HERNIA REPAIR    . LIPOMA EXCISION     L breast  . TONSILLECTOMY    . TUMOR EXCISION     Nerve sheath tumor, R arm   Family History:  Family History  Problem Relation Age of Onset  . Heart disease Father   . Lung cancer  Father   . Colon cancer Maternal Aunt   . Pancreatic cancer Maternal Grandmother   . Pancreatic cancer Maternal Grandfather   . Pancreatic cancer Paternal Grandmother   . Cancer Paternal Grandfather   . Sexual abuse Neg Hx    Social History:   Social History   Socioeconomic History  . Marital status: Divorced    Spouse name: Not on file  . Number of children: Not on file  . Years of  education: Not on file  . Highest education level: Not on file  Occupational History  . Not on file  Tobacco Use  . Smoking status: Never Smoker  . Smokeless tobacco: Never Used  Vaping Use  . Vaping Use: Never used  Substance and Sexual Activity  . Alcohol use: No    Alcohol/week: 0.0 standard drinks  . Drug use: No  . Sexual activity: Not Currently    Birth control/protection: I.U.D., None  Other Topics Concern  . Not on file  Social History Narrative  . Not on file   Social Determinants of Health   Financial Resource Strain:   . Difficulty of Paying Living Expenses: Not on file  Food Insecurity:   . Worried About Charity fundraiser in the Last Year: Not on file  . Ran Out of Food in the Last Year: Not on file  Transportation Needs:   . Lack of Transportation (Medical): Not on file  . Lack of Transportation (Non-Medical): Not on file  Physical Activity:   . Days of Exercise per Week: Not on file  . Minutes of Exercise per Session: Not on file  Stress:   . Feeling of Stress : Not on file  Social Connections:   . Frequency of Communication with Friends and Family: Not on file  . Frequency of Social Gatherings with Friends and Family: Not on file  . Attends Religious Services: Not on file  . Active Member of Clubs or Organizations: Not on file  . Attends Archivist Meetings: Not on file  . Marital Status: Not on file      Psychiatric Specialty Exam:    Depression        Associated symptoms include does not have insomnia and no suicidal ideas.   Review of Systems  Cardiovascular: Negative for chest pain.  Musculoskeletal: Positive for joint pain.  Psychiatric/Behavioral: Positive for depression. Negative for substance abuse and suicidal ideas. The patient does not have insomnia.     There were no vitals taken for this visit.There is no height or weight on file to calculate BMI.  General Appearance: fair  Eye Contact:  Good  Speech:  Clear and  Coherent  Volume:  Normal  Mood: somewhat subdued  Affect: congruent  Thought Process:  Coherent, Goal Directed, Intact, Linear and Logical  Orientation:  Full (Time, Place, and Person)  Thought Content:  WDL  Suicidal Thoughts:  No  Homicidal Thoughts:  No  Memory:  Immediate;   Good Recent;   Good Remote;   Good  Judgement:  Good  Insight:  Present  Psychomotor Activity:  Decreased  Concentration:  Fair  Recall:  Good  Fund of Knowledge:Good  Language: Good  Akathisia:  No  Handed:  Left  AIMS (if indicated):    Assets:  Communication Skills Desire for Improvement Financial Resources/Insurance Housing Resilience Transportation  ADL's:  Intact  Cognition: WNL  Sleep:  poor    Allergies:   Allergies  Allergen Reactions  . Erythromycin Other (See Comments)  arrhythmia  . Zocor  [Simvastatin-High Dose] Rash  . Doxycycline Other (See Comments)    Decreased BP and caused dizziness per patient  . Peroxide [Hydrogen Peroxide]    Current Medications: Current Outpatient Medications  Medication Sig Dispense Refill  . leflunomide (ARAVA) 10 MG tablet Take by mouth.    [name] atorvastatin (LIPITOR) 10 MG tablet TAKE 1 TABLET(10 MG) BY MOUTH DAILY 90 tablet 1  . buPROPion (WELLBUTRIN XL) 300 MG 24 hr tablet TAKE 1 TABLET BY MOUTH EVERY DAY 90 tablet 0  . busPIRone (BUSPAR) 10 MG tablet TAKE 1 TABLET BY MOUTH THREE TIMES DAILY 180 tablet 0  . Calcium-Magnesium-Vitamin D (CALCIUM MAGNESIUM PO) Take 1 tablet by mouth daily.    . cetirizine (ZYRTEC) 10 MG tablet Take 10 mg by mouth daily.     . Cholecalciferol (VITAMIN D) 2000 units CAPS Take 1 capsule by mouth daily.    . clotrimazole-betamethasone (LOTRISONE) cream Apply 1 application topically 2 (two) times daily. 30 g 0  . cyclobenzaprine (FLEXERIL) 5 MG tablet   1  . estradiol (CLIMARA) 0.05 mg/24hr patch Place 1 patch (0.05 mg total) onto the skin once a week. Twice weekly 24 patch 0  . etanercept (ENBREL) 50 MG/ML  injection Inject into the skin.    . fluconazole (DIFLUCAN) 150 MG tablet 1 po x1, may repeat in 3 days prn (Patient not taking: Reported on 04/28/2019) 2 tablet 0  . gabapentin (NEURONTIN) 300 MG capsule Take 3 capsules by mouth at bedtime. Takes 1AM + 4HS    . glucose blood (ONE TOUCH ULTRA TEST) test strip Test blood sugar once daily. Dx code: 250.00 100 each 12  . L-Glutamine 500 MG TABS Take 1 tablet by mouth daily.    . Lactobacillus (ACIDOPHILUS PO) Take 1 capsule by mouth daily. 2 Billion active cultures    . lamoTRIgine (LAMICTAL) 100 MG tablet Take 1 tablet (100 mg total) by mouth every morning. This is in addition to 200mg . Total dose now 300mg  per day 90 tablet 0  . lamoTRIgine (LAMICTAL) 200 MG tablet TAKE 1 TABLET AT BEDTIME 90 tablet 0  . levofloxacin (LEVAQUIN) 500 MG tablet Take 1 tablet (500 mg total) by mouth daily. (Patient not taking: Reported on 04/28/2019) 7 tablet 0  . levonorgestrel (MIRENA) 20 MCG/24HR IUD 1 each by Intrauterine route once.    [name] levothyroxine (SYNTHROID) 50 MCG tablet Take 1 tablet (50 mcg total) by mouth daily before breakfast. 90 tablet 1  . lisinopril (ZESTRIL) 5 MG tablet Take 1 tablet (5 mg total) by mouth daily. 90 tablet 1  . metFORMIN (GLUCOPHAGE-XR) 500 MG 24 hr tablet TAKE 1 TABLET(500 MG) BY MOUTH EVERY EVENING 90 tablet 1  . Multiple Vitamin (MULTIVITAMIN WITH MINERALS) TABS Take 1 tablet by mouth daily.    . mupirocin ointment (BACTROBAN) 2 % APPLY TO LESION ON LEFT BREAST TWICE DAILY FOR 14 DAYS  0  . naproxen sodium (ANAPROX) 220 MG tablet Take 220 mg by mouth.    Glory Rosebush DELICA LANCETS 74B MISC Test blood sugar once daily. Dx code: 250.00 100 each 12  . OVER THE COUNTER MEDICATION Vitamin B-12 5000 mcg 1 tablet daily.    [name] OVER THE COUNTER MEDICATION Amberen 2 tablets every morning    . oxyCODONE 10 MG TABS Take 1 tablet (10 mg total) by mouth every 6 (six) hours as needed (for pain). 60 tablet 0  . Phenylephrine-Acetaminophen (TYLENOL  SINUS CONGESTION/PAIN PO) Take 2 tablets by mouth daily  as needed (for sinus congestion).     . Sarilumab (KEVZARA) 200 MG/1.14ML SOAJ Inject 200 mg into the skin. Every 2 weeks    . terconazole (TERAZOL 7) 0.4 % vaginal cream Place 1 applicator vaginally at bedtime. Use for seven days (Patient not taking: Reported on 04/28/2019) 45 g 0  . tretinoin (RETIN-A) 0.025 % gel Apply 3 nights weekly x 2 weeks can increase to nightly only as tolerated     No current facility-administered medications for this visit.     Treatment Plan Summary: Medication management  1. Depression:somewhat subdued but related to pain when it effects her mood Continue wellbutrin, lamictal 2. Anxiety ; fluctuates, continue buspar. Consider therapy or DBT as discussed before  3. Insomnia: worsens when in pain. Reviewed sleep hygiene 4. Back pain:chronic. Working with providers and symptomatic management. FU for RA    Add more Me time and activitiy time to distract from dysporia  I discussed the assessment and treatment plan with the patient. The patient was provided an opportunity to ask questions and all were answered. The patient agreed with the plan and demonstrated an understanding of the instructions.   The patient was advised to call back or seek an in-person evaluation if the symptoms worsen or if the condition fails to improve as anticipated. FU 3-29m. Says will call if needed but can stretch appointment to 43m.  Non face to face time spent: 15 min Merian Capron, MD  3:57 PM 09/29/2019

## 2019-10-02 ENCOUNTER — Telehealth (HOSPITAL_COMMUNITY): Payer: Medicare PPO | Admitting: Psychiatry

## 2019-10-10 ENCOUNTER — Telehealth: Payer: Self-pay | Admitting: Pharmacist

## 2019-10-10 NOTE — Progress Notes (Addendum)
Chronic Care Management Pharmacy Assistant   Name: Michelle Mueller  MRN: [id number] DOB: February 05, 1965  Reason for Encounter: Disease State  Patient Questions:  1.  Have you seen any other providers since your last visit? Yes  2.  Any changes in your medicines or health? Yes    PCP : Ann Held, DO   Their chronic conditions include: Pre-DM/DM, HTN, HLD, Hypothyroidism, Depression/Anxiety, Rheumatoid Arthritis, Pain, Acne, Allergic Rhinitis, Hormone Replacement, Supplementation  Allergies:   Allergies  Allergen Reactions  . Erythromycin Other (See Comments)    arrhythmia  . Zocor  [Simvastatin-High Dose] Rash  . Doxycycline Other (See Comments)    Decreased BP and caused dizziness per patient  . Peroxide [Hydrogen Peroxide]     Medications: Outpatient Encounter Medications as of 10/10/2019  Medication Sig Note  . atorvastatin (LIPITOR) 10 MG tablet TAKE 1 TABLET(10 MG) BY MOUTH DAILY   . buPROPion (WELLBUTRIN XL) 300 MG 24 hr tablet TAKE 1 TABLET BY MOUTH EVERY DAY   . busPIRone (BUSPAR) 10 MG tablet TAKE 1 TABLET BY MOUTH THREE TIMES DAILY   . Calcium-Magnesium-Vitamin D (CALCIUM MAGNESIUM PO) Take 1 tablet by mouth daily.   . cetirizine (ZYRTEC) 10 MG tablet Take 10 mg by mouth daily.    . Cholecalciferol (VITAMIN D) 2000 units CAPS Take 1 capsule by mouth daily.   . clotrimazole-betamethasone (LOTRISONE) cream Apply 1 application topically 2 (two) times daily.   . cyclobenzaprine (FLEXERIL) 5 MG tablet  10/21/2014: Received from: External Pharmacy  . estradiol (CLIMARA) 0.05 mg/24hr patch Place 1 patch (0.05 mg total) onto the skin once a week. Twice weekly   . etanercept (ENBREL) 50 MG/ML injection Inject into the skin. 04/28/2019: Switched to [employer]  . fluconazole (DIFLUCAN) 150 MG tablet 1 po x1, may repeat in 3 days prn (Patient not taking: Reported on 04/28/2019)   . gabapentin (NEURONTIN) 300 MG capsule Take 3 capsules by mouth at bedtime. Takes 1AM + 4HS  03/19/2013: Received from: External Pharmacy Received Sig:   . glucose blood (ONE TOUCH ULTRA TEST) test strip Test blood sugar once daily. Dx code: 250.00   . L-Glutamine 500 MG TABS Take 1 tablet by mouth daily.   . Lactobacillus (ACIDOPHILUS PO) Take 1 capsule by mouth daily. 2 Billion active cultures   . lamoTRIgine (LAMICTAL) 100 MG tablet Take 1 tablet (100 mg total) by mouth every morning. This is in addition to 200mg . Total dose now 300mg  per day   . lamoTRIgine (LAMICTAL) 200 MG tablet TAKE 1 TABLET AT BEDTIME   . leflunomide (ARAVA) 10 MG tablet Take by mouth.   . levofloxacin (LEVAQUIN) 500 MG tablet Take 1 tablet (500 mg total) by mouth daily. (Patient not taking: Reported on 04/28/2019)   . levonorgestrel (MIRENA) 20 MCG/24HR IUD 1 each by Intrauterine route once.   [name] levothyroxine (SYNTHROID) 50 MCG tablet Take 1 tablet (50 mcg total) by mouth daily before breakfast.   . lisinopril (ZESTRIL) 5 MG tablet Take 1 tablet (5 mg total) by mouth daily.   . metFORMIN (GLUCOPHAGE-XR) 500 MG 24 hr tablet TAKE 1 TABLET(500 MG) BY MOUTH EVERY EVENING   . Multiple Vitamin (MULTIVITAMIN WITH MINERALS) TABS Take 1 tablet by mouth daily.   . mupirocin ointment (BACTROBAN) 2 % APPLY TO LESION ON LEFT BREAST TWICE DAILY FOR 14 DAYS   . naproxen sodium (ANAPROX) 220 MG tablet Take 220 mg by mouth.   Glory Rosebush DELICA LANCETS 81E MISC Test blood  sugar once daily. Dx code: 250.00   . OVER THE COUNTER MEDICATION Vitamin B-12 5000 mcg 1 tablet daily.   [name] OVER THE COUNTER MEDICATION Amberen 2 tablets every morning   . oxyCODONE 10 MG TABS Take 1 tablet (10 mg total) by mouth every 6 (six) hours as needed (for pain).   . Phenylephrine-Acetaminophen (TYLENOL SINUS CONGESTION/PAIN PO) Take 2 tablets by mouth daily as needed (for sinus congestion).    . Sarilumab (KEVZARA) 200 MG/1.14ML SOAJ Inject 200 mg into the skin. Every 2 weeks   . terconazole (TERAZOL 7) 0.4 % vaginal cream Place 1 applicator vaginally  at bedtime. Use for seven days (Patient not taking: Reported on 04/28/2019)   . tretinoin (RETIN-A) 0.025 % gel Apply 3 nights weekly x 2 weeks can increase to nightly only as tolerated   . [DISCONTINUED] escitalopram (LEXAPRO) 10 MG tablet TAKE 1 AND 1/2 TABLETS(15 MG) BY MOUTH DAILY   . [DISCONTINUED] FLUoxetine (PROZAC) 20 MG tablet Take 1 tablet (20 mg total) by mouth daily.   . [DISCONTINUED] venlafaxine (EFFEXOR) 37.5 MG tablet Take 1 tablet (37.5 mg total) by mouth 2 (two) times daily.    No facility-administered encounter medications on file as of 10/10/2019.    Current Diagnosis: Patient Active Problem List   Diagnosis Date Noted  . Influenza 02/28/2018  . Pansinusitis 02/28/2018  . Rash 01/13/2018  . Hypothyroidism 01/13/2018  . Hyperlipidemia associated with type 2 diabetes mellitus ([place]) 07/30/2016  . Rheumatoid arthritis of multiple sites with negative rheumatoid factor ([place]) 03/13/2016  . Chest pain of uncertain etiology 62/83/6629  . Essential hypertension 12/21/2014  . Myalgia 11/10/2014  . Insomnia 11/10/2014  . UTI (urinary tract infection) 08/04/2013  . Fibromyalgia muscle pain 06/13/2013  . Severe depression ([place]) 06/13/2013  . 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 Rheumatoid arthritis ([place]) 03/19/2013  . Fibromyalgia 03/19/2013  . Unspecified hypothyroidism 03/19/2013  . Goiter 03/19/2013  . Obesity (BMI 30-39.9) 03/19/2013  . Pseudoarthrosis of lumbar spine L4-L5 06/20/2012  . Family history of pancreatic cancer 01/18/2011  . Chronic diarrhea 01/18/2011    Goals Addressed   None    Reviewed chart prior to disease state call. Spoke with patient regarding BP  Recent Office Vitals: BP Readings from Last 3 Encounters:  09/17/18 124/88  07/19/18 (!) 145/86  06/05/18 128/87   Pulse Readings from Last 3 Encounters:  09/17/18 88  07/19/18 (!) 105  06/05/18 87    Wt  Readings from Last 3 Encounters:  09/17/18 200 lb 9.6 oz (91 kg)  07/19/18 192 lb (87.1 kg)  06/05/18 197 lb (89.4 kg)     Kidney Function Lab Results  Component Value Date/Time   CREATININE 0.98 09/18/2018 09:25 AM   CREATININE 1.02 01/10/2018 04:41 PM   CREATININE 1.07 07/28/2016 03:22 PM   CREATININE 0.95 01/28/2016 03:59 PM   GFR 59.71 (L) 09/18/2018 09:25 AM   GFRNONAA 59 (L) 06/05/2017 11:42 AM   GFRAA >60 06/05/2017 11:42 AM    BMP Latest Ref Rng & Units 09/18/2018 01/10/2018 07/19/2017  Glucose 70 - 99 mg/dL 87 87 76  BUN 6 - 23 mg/dL 10 17 10   Creatinine 0.40 - 1.20 mg/dL 0.98 1.02 0.90  Sodium 135 - 145 mEq/L 139 140 138  Potassium 3.5 - 5.1 mEq/L 3.5 4.2 3.9  Chloride 96 - 112 mEq/L 102 103 103  CO2 19 - 32 mEq/L 29 28 30   Calcium 8.4 -  10.5 mg/dL 8.8 9.6 8.6    . Current antihypertensive regimen:  o lisinopril 5mg  daily . How often are you checking your Blood Pressure? several times per month. Patient states she has several doctors appointments monthly and just has it checked while at a visit . Current home BP readings: Patient did not have any current home BP readings. . What recent interventions/DTPs have been made by any provider to improve Blood Pressure control since last CPP Visit: None . Any recent hospitalizations or ED visits since last visit with CPP? No . What diet changes have been made to improve Blood Pressure Control?  o Patient states she has not had much of an appetite lately due to her depression and is not eating as much. . What exercise is being done to improve your Blood Pressure Control?  o Patient states she has not been feeling like being active due to her depression.  Adherence Review: Is the patient currently on ACE/ARB medication? Yes Does the patient have >5 day gap between last estimated fill dates? No   [place] seeing Dr. March Rummage with [facility].  Follow-Up:  Pharmacist Review and  Scheduled Follow-Up With Clinical Pharmacist   Fanny Skates, [place] Pharmacist Assistant [phone]  Reviewed by: De Blanch, PharmD Clinical Pharmacist [facility] [phone]

## 2019-10-14 ENCOUNTER — Other Ambulatory Visit (HOSPITAL_COMMUNITY): Payer: Self-pay | Admitting: Psychiatry

## 2019-10-30 ENCOUNTER — Telehealth: Payer: Self-pay | Admitting: Pharmacist

## 2019-10-30 NOTE — Progress Notes (Addendum)
Attempted to verify the pateint's Adherence Gap Information. Entered their [employer] information into insurance data. Unable to locate patient's adherence information.  Fanny Skates, [place] Pharmacist Assistant [phone]  Noted that [place]. Reviewed by: De Blanch, PharmD Clinical Pharmacist [facility] [phone]

## 2019-11-24 ENCOUNTER — Other Ambulatory Visit (HOSPITAL_COMMUNITY): Payer: Self-pay | Admitting: Psychiatry

## 2019-12-15 ENCOUNTER — Other Ambulatory Visit (HOSPITAL_COMMUNITY): Payer: Self-pay | Admitting: Psychiatry

## 2020-01-07 ENCOUNTER — Other Ambulatory Visit (HOSPITAL_COMMUNITY): Payer: Self-pay | Admitting: Psychiatry

## 2020-01-19 ENCOUNTER — Encounter (HOSPITAL_COMMUNITY): Payer: Self-pay | Admitting: Psychiatry

## 2020-01-19 ENCOUNTER — Telehealth (INDEPENDENT_AMBULATORY_CARE_PROVIDER_SITE_OTHER): Payer: Medicare PPO | Admitting: Psychiatry

## 2020-01-19 DIAGNOSIS — F331 Major depressive disorder, recurrent, moderate: Secondary | ICD-10-CM

## 2020-01-19 DIAGNOSIS — G8929 Other chronic pain: Secondary | ICD-10-CM | POA: Diagnosis not present

## 2020-01-19 DIAGNOSIS — F411 Generalized anxiety disorder: Secondary | ICD-10-CM

## 2020-01-19 DIAGNOSIS — M549 Dorsalgia, unspecified: Secondary | ICD-10-CM

## 2020-01-19 NOTE — Progress Notes (Signed)
Patient ID: Michelle Mueller, female   DOB: [birth year], 54 y.o.   MRN: [id number]  Psychiatric Outpatient Follow up Visit  Patient Identification: Michelle Mueller MRN:  [id number] Date of Evaluation:  01/19/2020 Referral Source: [name] Chief Complaint:    depression . Follow up Visit Diagnosis: MDD. GAD . Mood related to Chronic back pain Diagnosis:   Patient Active Problem List   Diagnosis Date Noted  . Influenza [J11.1] 02/28/2018  . Pansinusitis [J32.4] 02/28/2018  . Rash [R21] 01/13/2018  . Hypothyroidism [E03.9] 01/13/2018  . Hyperlipidemia associated with type 2 diabetes mellitus ([place]) [E11.69, E78.5] 07/30/2016  . Rheumatoid arthritis of multiple sites with negative rheumatoid factor ([place]) [M06.09] 03/13/2016  . Chest pain of uncertain etiology [J67.3] 12/21/2014  . Essential hypertension [I10] 12/21/2014  . Myalgia [M79.10] 11/10/2014  . Insomnia [G47.00] 11/10/2014  . UTI (urinary tract infection) [N39.0] 08/04/2013  . Fibromyalgia muscle pain [M79.7] 06/13/2013  . Severe depression ([place]) [F32.2] 06/13/2013  . 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 Rheumatoid arthritis ([place]) [M06.9] 03/19/2013  . Fibromyalgia [M79.7] 03/19/2013  . Unspecified hypothyroidism [E03.9] 03/19/2013  . Goiter [E04.9] 03/19/2013  . Obesity (BMI 30-39.9) [E66.9] 03/19/2013  . 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 Family history of pancreatic cancer [Z80.0] 01/18/2011  . Chronic diarrhea [K52.9] 01/18/2011      I connected with Michelle Mueller on 01/19/20 at  1:30 PM EST by a video enabled telemedicine application and verified that I am speaking with the correct person using two identifiers.  I discussed the limitations of evaluation and management by telemedicine and the availability of in person appointments. The patient expressed understanding and agreed to  proceed.  Location Patient: home virtual appointment Provider: home office  Pain can effect mood, she didn't start the new RA med says concern was some side effects Otherwise tolerating depression meds, has sons support  Looking forward to christmas this year     She has made some friends She continues with lamictal, wellbutrin  Modifying factor: kids   Timing:morning and when in pain  associated symptoms: back pain  Past Medical History:  Past Medical History:  Diagnosis Date  . Anxiety   . Arthritis   . Asthma    last attack 3 yrs ago  . Chicken pox   . Depression   . Diabetes mellitus without complication ([place])   . Fibromyalgia   . Goiter   . Hypertension    [name]  . Hypothyroidism   . Ovarian cyst   . PONV (postoperative nausea and vomiting)     Past Surgical History:  Procedure Laterality Date  . ABDOMINAL EXPOSURE  12/18/2011   Procedure: ABDOMINAL EXPOSURE;  Surgeon: [name], MD;  Location: [facility];  Service: Vascular;  Laterality: N/A;  Anterior Expossure for anterior lumbar interbody fuson  . ANTERIOR LUMBAR FUSION  12/18/2011   Procedure: ANTERIOR LUMBAR FUSION 1 LEVEL;  Surgeon: [name], MD;  Location: [facility];  Service: Neurosurgery;  Laterality: N/A;  Lumbar four-five Anterior Lumbar Interbody Fusion with Dr. [name] to do anterior exposure  . CESAREAN SECTION    . CHOLECYSTECTOMY    . HERNIA REPAIR    . LIPOMA EXCISION     L breast  . TONSILLECTOMY    . TUMOR EXCISION     Nerve sheath tumor, R arm   Family History:  Family History  Problem  Relation Age of Onset  . Heart disease Father   . Lung cancer Father   . Colon cancer Maternal Aunt   . Pancreatic cancer Maternal Grandmother   . Pancreatic cancer Maternal Grandfather   . Pancreatic cancer Paternal Grandmother   . Cancer Paternal Grandfather   . Sexual abuse Neg Hx    Social History:   Social History   Socioeconomic History  . Marital status: Divorced     Spouse name: Not on file  . Number of children: Not on file  . Years of education: Not on file  . Highest education level: Not on file  Occupational History  . Not on file  Tobacco Use  . Smoking status: Never Smoker  . Smokeless tobacco: Never Used  Vaping Use  . Vaping Use: Never used  Substance and Sexual Activity  . Alcohol use: No    Alcohol/week: 0.0 standard drinks  . Drug use: No  . Sexual activity: Not Currently    Birth control/protection: I.U.D., None  Other Topics Concern  . Not on file  Social History Narrative  . Not on file   Social Determinants of Health   Financial Resource Strain: Not on file  Food Insecurity: Not on file  Transportation Needs: Not on file  Physical Activity: Not on file  Stress: Not on file  Social Connections: Not on file      Psychiatric Specialty Exam:    Depression        Associated symptoms include does not have insomnia and no suicidal ideas.   Review of Systems  Cardiovascular: Negative for chest pain.  Musculoskeletal: Positive for joint pain.  Psychiatric/Behavioral: Positive for depression. Negative for substance abuse and suicidal ideas. The patient does not have insomnia.     There were no vitals taken for this visit.There is no height or weight on file to calculate BMI.  General Appearance: fair  Eye Contact:  Good  Speech:  Clear and Coherent  Volume:  Normal  Mood: some better  Affect: congruent  Thought Process:  Coherent, Goal Directed, Intact, Linear and Logical  Orientation:  Full (Time, Place, and Person)  Thought Content:  WDL  Suicidal Thoughts:  No  Homicidal Thoughts:  No  Memory:  Immediate;   Good Recent;   Good Remote;   Good  Judgement:  Good  Insight:  Present  Psychomotor Activity:  Decreased  Concentration:  Fair  Recall:  Good  Fund of Knowledge:Good  Language: Good  Akathisia:  No  Handed:  Left  AIMS (if indicated):    Assets:  Communication Skills Desire for  Improvement Financial Resources/Insurance Housing Resilience Transportation  ADL's:  Intact  Cognition: WNL  Sleep:  poor    Allergies:   Allergies  Allergen Reactions  . Erythromycin Other (See Comments)    arrhythmia  . Zocor  [Simvastatin-High Dose] Rash  . Doxycycline Other (See Comments)    Decreased BP and caused dizziness per patient  . Peroxide [Hydrogen Peroxide]    Current Medications: Current Outpatient Medications  Medication Sig Dispense Refill  . atorvastatin (LIPITOR) 10 MG tablet TAKE 1 TABLET(10 MG) BY MOUTH DAILY 90 tablet 1  . buPROPion (WELLBUTRIN XL) 300 MG 24 hr tablet TAKE 1 TABLET EVERY DAY 90 tablet 0  . busPIRone (BUSPAR) 10 MG tablet TAKE 1 TABLET THREE TIMES DAILY 180 tablet 0  . Calcium-Magnesium-Vitamin D (CALCIUM MAGNESIUM PO) Take 1 tablet by mouth daily.    . cetirizine (ZYRTEC) 10 MG tablet Take 10  mg by mouth daily.     . Cholecalciferol (VITAMIN D) 2000 units CAPS Take 1 capsule by mouth daily.    . clotrimazole-betamethasone (LOTRISONE) cream Apply 1 application topically 2 (two) times daily. 30 g 0  . cyclobenzaprine (FLEXERIL) 5 MG tablet   1  . estradiol (CLIMARA) 0.05 mg/24hr patch Place 1 patch (0.05 mg total) onto the skin once a week. Twice weekly 24 patch 0  . fluconazole (DIFLUCAN) 150 MG tablet 1 po x1, may repeat in 3 days prn (Patient not taking: Reported on 04/28/2019) 2 tablet 0  . gabapentin (NEURONTIN) 300 MG capsule Take 3 capsules by mouth at bedtime. Takes 1AM + 4HS    . glucose blood (ONE TOUCH ULTRA TEST) test strip Test blood sugar once daily. Dx code: 250.00 100 each 12  . L-Glutamine 500 MG TABS Take 1 tablet by mouth daily.    . Lactobacillus (ACIDOPHILUS PO) Take 1 capsule by mouth daily. 2 Billion active cultures    . lamoTRIgine (LAMICTAL) 100 MG tablet TAKE 1 TABLET EVERY MORNING ALONG WITH 200MG  TABLET TO TOTAL 300MG  PER DAY 90 tablet 0  . lamoTRIgine (LAMICTAL) 200 MG tablet TAKE 1 TABLET AT BEDTIME 90 tablet 0   . leflunomide (ARAVA) 10 MG tablet Take by mouth.    . levofloxacin (LEVAQUIN) 500 MG tablet Take 1 tablet (500 mg total) by mouth daily. (Patient not taking: Reported on 04/28/2019) 7 tablet 0  . levonorgestrel (MIRENA) 20 MCG/24HR IUD 1 each by Intrauterine route once.    [name] levothyroxine (SYNTHROID) 50 MCG tablet Take 1 tablet (50 mcg total) by mouth daily before breakfast. 90 tablet 1  . lisinopril (ZESTRIL) 5 MG tablet Take 1 tablet (5 mg total) by mouth daily. 90 tablet 1  . metFORMIN (GLUCOPHAGE-XR) 500 MG 24 hr tablet TAKE 1 TABLET(500 MG) BY MOUTH EVERY EVENING 90 tablet 1  . Multiple Vitamin (MULTIVITAMIN WITH MINERALS) TABS Take 1 tablet by mouth daily.    . mupirocin ointment (BACTROBAN) 2 % APPLY TO LESION ON LEFT BREAST TWICE DAILY FOR 14 DAYS  0  . naproxen sodium (ANAPROX) 220 MG tablet Take 220 mg by mouth.    Glory Rosebush DELICA LANCETS 26J MISC Test blood sugar once daily. Dx code: 250.00 100 each 12  . OVER THE COUNTER MEDICATION Vitamin B-12 5000 mcg 1 tablet daily.    [name] OVER THE COUNTER MEDICATION Amberen 2 tablets every morning    . oxyCODONE 10 MG TABS Take 1 tablet (10 mg total) by mouth every 6 (six) hours as needed (for pain). 60 tablet 0  . Phenylephrine-Acetaminophen (TYLENOL SINUS CONGESTION/PAIN PO) Take 2 tablets by mouth daily as needed (for sinus congestion).     . Sarilumab (KEVZARA) 200 MG/1.14ML SOAJ Inject 200 mg into the skin. Every 2 weeks    . terconazole (TERAZOL 7) 0.4 % vaginal cream Place 1 applicator vaginally at bedtime. Use for seven days (Patient not taking: Reported on 04/28/2019) 45 g 0  . tretinoin (RETIN-A) 0.025 % gel Apply 3 nights weekly x 2 weeks can increase to nightly only as tolerated     No current facility-administered medications for this visit.     Treatment Plan Summary: Medication management  1. Depression:some better, continue wellbutrin, lamictal no rash Concern remains pain of RA and follows with providers 2. Anxiety ;  fluctuates, continue buspar. Consider therapy or DBT as discussed before  3. Insomnia: no change, not worse  4. Back pain:chronic. Working with providers and symptomatic  management. FU for RA    Add more Me time and activitiy time to distract from dysporia  I discussed the assessment and treatment plan with the patient. The patient was provided an opportunity to ask questions and all were answered. The patient agreed with the plan and demonstrated an understanding of the instructions.   The patient was advised to call back or seek an in-person evaluation if the symptoms worsen or if the condition fails to improve as anticipated. FU 3-32m. Says will call if needed but can stretch appointment to 78m.  Non face to face time spent: 15 min Merian Capron, MD  1:41 PM 01/19/2020

## 2020-02-23 ENCOUNTER — Other Ambulatory Visit (HOSPITAL_COMMUNITY): Payer: Self-pay | Admitting: Psychiatry

## 2020-04-19 ENCOUNTER — Telehealth (HOSPITAL_COMMUNITY): Payer: Medicare PPO | Admitting: Psychiatry

## 2020-04-21 ENCOUNTER — Telehealth (INDEPENDENT_AMBULATORY_CARE_PROVIDER_SITE_OTHER): Payer: Medicare PPO | Admitting: Psychiatry

## 2020-04-21 ENCOUNTER — Encounter (HOSPITAL_COMMUNITY): Payer: Self-pay | Admitting: Psychiatry

## 2020-04-21 DIAGNOSIS — F411 Generalized anxiety disorder: Secondary | ICD-10-CM

## 2020-04-21 DIAGNOSIS — F331 Major depressive disorder, recurrent, moderate: Secondary | ICD-10-CM | POA: Diagnosis not present

## 2020-04-21 MED ORDER — LAMOTRIGINE 100 MG PO TABS
ORAL_TABLET | ORAL | 0 refills | Status: DC
Start: 2020-04-21 — End: 2020-07-20

## 2020-04-21 MED ORDER — LAMOTRIGINE 200 MG PO TABS: 200.0000 mg | ORAL_TABLET | Freq: Every day | ORAL | 0 refills | Status: DC

## 2020-04-21 MED ORDER — BUSPIRONE HCL 10 MG PO TABS: 10.0000 mg | ORAL_TABLET | Freq: Three times a day (TID) | ORAL | 0 refills | Status: DC

## 2020-04-21 MED ORDER — BUPROPION HCL ER (XL) 300 MG PO TB24: ORAL_TABLET | ORAL | 0 refills | Status: DC

## 2020-04-21 NOTE — Progress Notes (Signed)
Patient ID: Michelle Mueller, female   DOB: [birth year], 55 y.o.   MRN: [id number]  Psychiatric Outpatient Follow up Visit  Patient Identification: Michelle Mueller MRN:  [id number] Date of Evaluation:  04/21/2020 Referral Source: [name] Chief Complaint:    depression . Follow up Visit Diagnosis: MDD. GAD . Mood related to Chronic back pain Diagnosis:   Patient Active Problem List   Diagnosis Date Noted  . Influenza [J11.1] 02/28/2018  . Pansinusitis [J32.4] 02/28/2018  . Rash [R21] 01/13/2018  . Hypothyroidism [E03.9] 01/13/2018  . Hyperlipidemia associated with type 2 diabetes mellitus ([place]) [E11.69, E78.5] 07/30/2016  . Rheumatoid arthritis of multiple sites with negative rheumatoid factor ([place]) [M06.09] 03/13/2016  . Chest pain of uncertain etiology [M08.6] 12/21/2014  . Essential hypertension [I10] 12/21/2014  . Myalgia [M79.10] 11/10/2014  . Insomnia [G47.00] 11/10/2014  . UTI (urinary tract infection) [N39.0] 08/04/2013  . Fibromyalgia muscle pain [M79.7] 06/13/2013  . Severe depression ([place]) [F32.2] 06/13/2013  . 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 Rheumatoid arthritis ([place]) [M06.9] 03/19/2013  . Fibromyalgia [M79.7] 03/19/2013  . Unspecified hypothyroidism [E03.9] 03/19/2013  . Goiter [E04.9] 03/19/2013  . Obesity (BMI 30-39.9) [E66.9] 03/19/2013  . 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 Family history of pancreatic cancer [Z80.0] 01/18/2011  . Chronic diarrhea [K52.9] 01/18/2011    Virtual Visit via Video Note  I connected with Michelle Mueller on 04/21/20 at 10:45 AM EDT by a video enabled telemedicine application and verified that I am speaking with the correct person using two identifiers.  Location: Patient: home Provider: home office   I discussed the limitations of evaluation and management by telemedicine and the availability of in person  appointments. The patient expressed understanding and agreed to proceed.      I discussed the assessment and treatment plan with the patient. The patient was provided an opportunity to ask questions and all were answered. The patient agreed with the plan and demonstrated an understanding of the instructions.   The patient was advised to call back or seek an in-person evaluation if the symptoms worsen or if the condition fails to improve as anticipated.  I provided 10  minutes of non-face-to-face time during this encounter.   HPI: doing fair mood wise, feels some fatigue post covid but planning to start garden, motivates herself to do things, have friends to socialize, doesn't want to change meds  Pain being managed by PCP for arthritis and exploring medications  No rash  Modifying factor: kids    Past Medical History:  Past Medical History:  Diagnosis Date  . Anxiety   . Arthritis   . Asthma    last attack 3 yrs ago  . Chicken pox   . Depression   . Diabetes mellitus without complication ([place])   . Fibromyalgia   . Goiter   . Hypertension    [name]  . Hypothyroidism   . Ovarian cyst   . PONV (postoperative nausea and vomiting)     Past Surgical History:  Procedure Laterality Date  . ABDOMINAL EXPOSURE  12/18/2011   Procedure: ABDOMINAL EXPOSURE;  Surgeon: [name], MD;  Location: [facility];  Service: Vascular;  Laterality: N/A;  Anterior Expossure for anterior lumbar interbody fuson  . ANTERIOR LUMBAR FUSION  12/18/2011   Procedure: ANTERIOR LUMBAR FUSION 1 LEVEL;  Surgeon: [name], MD;  Location: [facility];  Service: Neurosurgery;  Laterality: N/A;  Lumbar four-five Anterior Lumbar Interbody Fusion with Dr. [name] to do anterior exposure  . CESAREAN SECTION    . CHOLECYSTECTOMY    . HERNIA REPAIR    . LIPOMA EXCISION     L breast  . TONSILLECTOMY    . TUMOR EXCISION     Nerve sheath tumor, R arm   Family History:  Family History  Problem  Relation Age of Onset  . Heart disease Father   . Lung cancer Father   . Colon cancer Maternal Aunt   . Pancreatic cancer Maternal Grandmother   . Pancreatic cancer Maternal Grandfather   . Pancreatic cancer Paternal Grandmother   . Cancer Paternal Grandfather   . Sexual abuse Neg Hx    Social History:   Social History   Socioeconomic History  . Marital status: Divorced    Spouse name: Not on file  . Number of children: Not on file  . Years of education: Not on file  . Highest education level: Not on file  Occupational History  . Not on file  Tobacco Use  . Smoking status: Never Smoker  . Smokeless tobacco: Never Used  Vaping Use  . Vaping Use: Never used  Substance and Sexual Activity  . Alcohol use: No    Alcohol/week: 0.0 standard drinks  . Drug use: No  . Sexual activity: Not Currently    Birth control/protection: I.U.D., None  Other Topics Concern  . Not on file  Social History Narrative  . Not on file   Social Determinants of Health   Financial Resource Strain: Not on file  Food Insecurity: Not on file  Transportation Needs: Not on file  Physical Activity: Not on file  Stress: Not on file  Social Connections: Not on file      Psychiatric Specialty Exam:    Depression        Associated symptoms include does not have insomnia and no suicidal ideas.   Review of Systems  Cardiovascular: Negative for chest pain.  Musculoskeletal: Positive for joint pain.  Psychiatric/Behavioral: Positive for depression. Negative for substance abuse and suicidal ideas. The patient does not have insomnia.     There were no vitals taken for this visit.There is no height or weight on file to calculate BMI.  General Appearance: fair  Eye Contact:  Good  Speech:  Clear and Coherent  Volume:  Normal  Mood:fair  Affect: congruent  Thought Process:  Coherent, Goal Directed, Intact, Linear and Logical  Orientation:  Full (Time, Place, and Person)  Thought Content:  WDL   Suicidal Thoughts:  No  Homicidal Thoughts:  No  Memory:  Immediate;   Good Recent;   Good Remote;   Good  Judgement:  Good  Insight:  Present  Psychomotor Activity:  Decreased  Concentration:  Fair  Recall:  Good  Fund of Knowledge:Good  Language: Good  Akathisia:  No  Handed:  Left  AIMS (if indicated):    Assets:  Communication Skills Desire for Improvement Financial Resources/Insurance Housing Resilience Transportation  ADL's:  Intact  Cognition: WNL  Sleep:  poor    Allergies:   Allergies  Allergen Reactions  . Erythromycin Other (See Comments)    arrhythmia  . Zocor  [Simvastatin-High Dose] Rash  . Doxycycline Other (See Comments)    Decreased BP and caused dizziness per patient  . Peroxide [Hydrogen Peroxide]    Current Medications: Current Outpatient Medications  Medication Sig Dispense Refill  . atorvastatin (LIPITOR) 10 MG tablet  TAKE 1 TABLET(10 MG) BY MOUTH DAILY 90 tablet 1  . buPROPion (WELLBUTRIN XL) 300 MG 24 hr tablet TAKE 1 TABLET EVERY DAY 90 tablet 0  . busPIRone (BUSPAR) 10 MG tablet Take 1 tablet (10 mg total) by mouth 3 (three) times daily. 180 tablet 0  . Calcium-Magnesium-Vitamin D (CALCIUM MAGNESIUM PO) Take 1 tablet by mouth daily.    . cetirizine (ZYRTEC) 10 MG tablet Take 10 mg by mouth daily.     . Cholecalciferol (VITAMIN D) 2000 units CAPS Take 1 capsule by mouth daily.    . clotrimazole-betamethasone (LOTRISONE) cream Apply 1 application topically 2 (two) times daily. 30 g 0  . cyclobenzaprine (FLEXERIL) 5 MG tablet   1  . estradiol (CLIMARA) 0.05 mg/24hr patch Place 1 patch (0.05 mg total) onto the skin once a week. Twice weekly 24 patch 0  . fluconazole (DIFLUCAN) 150 MG tablet 1 po x1, may repeat in 3 days prn (Patient not taking: Reported on 04/28/2019) 2 tablet 0  . gabapentin (NEURONTIN) 300 MG capsule Take 3 capsules by mouth at bedtime. Takes 1AM + 4HS    . glucose blood (ONE TOUCH ULTRA TEST) test strip Test blood sugar once  daily. Dx code: 250.00 100 each 12  . L-Glutamine 500 MG TABS Take 1 tablet by mouth daily.    . Lactobacillus (ACIDOPHILUS PO) Take 1 capsule by mouth daily. 2 Billion active cultures    . lamoTRIgine (LAMICTAL) 100 MG tablet TAKE 1 TABLET EVERY MORNING ALONG WITH 200MG  TABLET TO TOTAL 300MG  PER DAY 90 tablet 0  . lamoTRIgine (LAMICTAL) 200 MG tablet Take 1 tablet (200 mg total) by mouth at bedtime. 90 tablet 0  . leflunomide (ARAVA) 10 MG tablet Take by mouth.    . levofloxacin (LEVAQUIN) 500 MG tablet Take 1 tablet (500 mg total) by mouth daily. (Patient not taking: Reported on 04/28/2019) 7 tablet 0  . levonorgestrel (MIRENA) 20 MCG/24HR IUD 1 each by Intrauterine route once.    [name] levothyroxine (SYNTHROID) 50 MCG tablet Take 1 tablet (50 mcg total) by mouth daily before breakfast. 90 tablet 1  . lisinopril (ZESTRIL) 5 MG tablet Take 1 tablet (5 mg total) by mouth daily. 90 tablet 1  . metFORMIN (GLUCOPHAGE-XR) 500 MG 24 hr tablet TAKE 1 TABLET(500 MG) BY MOUTH EVERY EVENING 90 tablet 1  . Multiple Vitamin (MULTIVITAMIN WITH MINERALS) TABS Take 1 tablet by mouth daily.    . mupirocin ointment (BACTROBAN) 2 % APPLY TO LESION ON LEFT BREAST TWICE DAILY FOR 14 DAYS  0  . naproxen sodium (ANAPROX) 220 MG tablet Take 220 mg by mouth.    Glory Rosebush DELICA LANCETS 94R MISC Test blood sugar once daily. Dx code: 250.00 100 each 12  . OVER THE COUNTER MEDICATION Vitamin B-12 5000 mcg 1 tablet daily.    [name] OVER THE COUNTER MEDICATION Amberen 2 tablets every morning    . oxyCODONE 10 MG TABS Take 1 tablet (10 mg total) by mouth every 6 (six) hours as needed (for pain). 60 tablet 0  . Phenylephrine-Acetaminophen (TYLENOL SINUS CONGESTION/PAIN PO) Take 2 tablets by mouth daily as needed (for sinus congestion).     . Sarilumab (KEVZARA) 200 MG/1.14ML SOAJ Inject 200 mg into the skin. Every 2 weeks    . terconazole (TERAZOL 7) 0.4 % vaginal cream Place 1 applicator vaginally at bedtime. Use for seven days  (Patient not taking: Reported on 04/28/2019) 45 g 0  . tretinoin (RETIN-A) 0.025 % gel Apply 3  nights weekly x 2 weeks can increase to nightly only as tolerated     No current facility-administered medications for this visit.     Treatment Plan Summary: Medication management  1. Depression: fair, some fatigue, but wants to keep current meds as such Adding activities including garden,  2. Anxiety ; fluctuates, takes buspar at times bid , otherwise tid if more anxious  3. Insomnia: no change, not worse  4. Back pain:chronic. Working with providers and symptomatic management. FU for RA    Fu 27m.  Merian Capron, MD  10:55 AM 04/21/2020

## 2020-07-12 ENCOUNTER — Telehealth (HOSPITAL_COMMUNITY): Payer: Medicare PPO | Admitting: Psychiatry

## 2020-07-20 ENCOUNTER — Telehealth (INDEPENDENT_AMBULATORY_CARE_PROVIDER_SITE_OTHER): Payer: Medicare PPO | Admitting: Psychiatry

## 2020-07-20 ENCOUNTER — Encounter (HOSPITAL_COMMUNITY): Payer: Self-pay | Admitting: Psychiatry

## 2020-07-20 DIAGNOSIS — G8929 Other chronic pain: Secondary | ICD-10-CM

## 2020-07-20 DIAGNOSIS — M549 Dorsalgia, unspecified: Secondary | ICD-10-CM | POA: Diagnosis not present

## 2020-07-20 DIAGNOSIS — F331 Major depressive disorder, recurrent, moderate: Secondary | ICD-10-CM

## 2020-07-20 DIAGNOSIS — F411 Generalized anxiety disorder: Secondary | ICD-10-CM

## 2020-07-20 MED ORDER — BUSPIRONE HCL 10 MG PO TABS: 10.0000 mg | ORAL_TABLET | Freq: Three times a day (TID) | ORAL | 0 refills | Status: DC

## 2020-07-20 MED ORDER — LAMOTRIGINE 100 MG PO TABS: ORAL_TABLET | ORAL | 0 refills | Status: DC

## 2020-07-20 MED ORDER — LAMOTRIGINE 200 MG PO TABS: 200.0000 mg | ORAL_TABLET | Freq: Every day | ORAL | 0 refills | Status: DC

## 2020-07-20 MED ORDER — BUPROPION HCL ER (XL) 300 MG PO TB24: ORAL_TABLET | ORAL | 0 refills | Status: DC

## 2020-07-20 NOTE — Progress Notes (Signed)
Patient ID: Michelle Mueller, female   DOB: 07-Jan-1966, 55 y.o.   MRN: [id number]  Psychiatric Outpatient Follow up Visit  Patient Identification: Michelle Mueller MRN:  [id number] Date of Evaluation:  07/20/2020 Referral Source: [name] Chief Complaint:    depression . Follow up Visit Diagnosis: MDD. GAD . Mood related to Chronic back pain Diagnosis:   Patient Active Problem List   Diagnosis Date Noted   Influenza [J11.1] 02/28/2018   Pansinusitis [J32.4] 02/28/2018   Rash [R21] 01/13/2018   Hypothyroidism [E03.9] 01/13/2018   Hyperlipidemia associated with type 2 diabetes mellitus ([place]) [E11.69, E78.5] 07/30/2016   Rheumatoid arthritis of multiple sites with negative rheumatoid factor ([place]) [M06.09] 03/13/2016   Chest pain of uncertain etiology [Q33.0] 12/21/2014   Essential hypertension [I10] 12/21/2014   Myalgia [M79.10] 11/10/2014   Insomnia [G47.00] 11/10/2014   UTI (urinary tract infection) [N39.0] 08/04/2013   Fibromyalgia muscle pain [M79.7] 06/13/2013   Severe depression ([place]) [F32.2] 06/13/2013   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, without long-term current use of insulin ([place]) [E11.620] 06/13/2013   Rheumatoid arthritis ([place]) [M06.9] 03/19/2013   Fibromyalgia [M79.7] 03/19/2013   Unspecified hypothyroidism [E03.9] 03/19/2013   Goiter [E04.9] 03/19/2013   Obesity (BMI 30-39.9) [E66.9] 03/19/2013   Pseudoarthrosis of lumbar spine L4-L5 [S32.009K] 06/20/2012   Family history of pancreatic cancer [Z80.0] 01/18/2011   Chronic diarrhea [K52.9] 01/18/2011    Virtual Visit via Video Note  I connected with Michelle Mueller on 07/20/20 at  3:00 PM EDT by a video enabled telemedicine application and verified that I am speaking with the correct person using two identifiers.  Location: Patient: home Provider: office   I discussed the limitations of evaluation and management by telemedicine and the availability of in person appointments. The patient  expressed understanding and agreed to proceed.      I discussed the assessment and treatment plan with the patient. The patient was provided an opportunity to ask questions and all were answered. The patient agreed with the plan and demonstrated an understanding of the instructions.   The patient was advised to call back or seek an in-person evaluation if the symptoms worsen or if the condition fails to improve as anticipated.  I provided 10 minutes of non-face-to-face time during this encounter.    HPI:  Doing fair but back condition affects mood she has had a surgery before she will have another consultation but for now she is doing chiropractory.  Arthritis medication discussed with provider by her   Pain being managed by PCP for arthritis and exploring medications  No rash  Modifying factor: Kids one of her son lives with her now some support and there neighborhood with friends    Past Medical History:  Past Medical History:  Diagnosis Date   Anxiety    Arthritis    Asthma    last attack 3 yrs ago   Chicken pox    Depression    Diabetes mellitus without complication ([place])    Fibromyalgia    Goiter    Hypertension    [name]   Hypothyroidism    Ovarian cyst    PONV (postoperative nausea and vomiting)     Past Surgical History:  Procedure Laterality Date   ABDOMINAL EXPOSURE  12/18/2011   Procedure: ABDOMINAL EXPOSURE;  Surgeon: [name], MD;  Location: [facility];  Service: Vascular;  Laterality: N/A;  Anterior Expossure for anterior lumbar interbody fuson   ANTERIOR LUMBAR FUSION  12/18/2011   Procedure: ANTERIOR LUMBAR FUSION 1 LEVEL;  Surgeon: [name], MD;  Location: [facility];  Service: Neurosurgery;  Laterality: N/A;  Lumbar four-five Anterior Lumbar Interbody Fusion with Dr. [name] to do anterior exposure   [place]     L breast   TONSILLECTOMY     TUMOR EXCISION     Nerve  sheath tumor, R arm   Family History:  Family History  Problem Relation Age of Onset   Heart disease Father    Lung cancer Father    Colon cancer Maternal Aunt    Pancreatic cancer Maternal Grandmother    Pancreatic cancer Maternal Grandfather    Pancreatic cancer Paternal Grandmother    Cancer Paternal Grandfather    Sexual abuse Neg Hx    Social History:   Social History   Socioeconomic History   Marital status: Divorced    Spouse name: Not on file   Number of children: Not on file   Years of education: Not on file   Highest education level: Not on file  Occupational History   Not on file  Tobacco Use   Smoking status: Never   Smokeless tobacco: Never  Vaping Use   Vaping Use: Never used  Substance and Sexual Activity   Alcohol use: No    Alcohol/week: 0.0 standard drinks   Drug use: No   Sexual activity: Not Currently    Birth control/protection: I.U.D., None  Other Topics Concern   Not on file  Social History Narrative   Not on file   Social Determinants of Health   Financial Resource Strain: Not on file  Food Insecurity: Not on file  Transportation Needs: Not on file  Physical Activity: Not on file  Stress: Not on file  Social Connections: Not on file      Psychiatric Specialty Exam:   Depression        Associated symptoms include no suicidal ideas.  Review of Systems  Cardiovascular:  Negative for chest pain.  Musculoskeletal:  Positive for back pain.  Psychiatric/Behavioral:  Negative for substance abuse and suicidal ideas.    There were no vitals taken for this visit.There is no height or weight on file to calculate BMI.  General Appearance: fair  Eye Contact:  Good  Speech:  Clear and Coherent  Volume:  Normal  Mood:fair  Affect: congruent  Thought Process:  Coherent, Goal Directed, Intact, Linear and Logical  Orientation:  Full (Time, Place, and Person)  Thought Content:  WDL  Suicidal Thoughts:  No  Homicidal Thoughts:  No  Memory:   Immediate;   Good Recent;   Good Remote;   Good  Judgement:  Good  Insight:  Present  Psychomotor Activity:  Decreased  Concentration:  Fair  Recall:  Good  Fund of Knowledge:Good  Language: Good  Akathisia:  No  Handed:  Left  AIMS (if indicated):    Assets:  Communication Skills Desire for Improvement Financial Resources/Insurance Housing Resilience Transportation  ADL's:  Intact  Cognition: WNL  Sleep:  poor    Allergies:   Allergies  Allergen Reactions   Erythromycin Other (See Comments)    arrhythmia   Zocor  [Simvastatin-High Dose] Rash   Doxycycline Other (See Comments)    Decreased BP and caused dizziness per patient   Peroxide [Hydrogen Peroxide]    Current Medications: Current Outpatient Medications  Medication Sig Dispense Refill  atorvastatin (LIPITOR) 10 MG tablet TAKE 1 TABLET(10 MG) BY MOUTH DAILY 90 tablet 1   buPROPion (WELLBUTRIN XL) 300 MG 24 hr tablet TAKE 1 TABLET EVERY DAY 90 tablet 0   busPIRone (BUSPAR) 10 MG tablet Take 1 tablet (10 mg total) by mouth 3 (three) times daily. 180 tablet 0   Calcium-Magnesium-Vitamin D (CALCIUM MAGNESIUM PO) Take 1 tablet by mouth daily.     cetirizine (ZYRTEC) 10 MG tablet Take 10 mg by mouth daily.      Cholecalciferol (VITAMIN D) 2000 units CAPS Take 1 capsule by mouth daily.     clotrimazole-betamethasone (LOTRISONE) cream Apply 1 application topically 2 (two) times daily. 30 g 0   cyclobenzaprine (FLEXERIL) 5 MG tablet   1   estradiol (CLIMARA) 0.05 mg/24hr patch Place 1 patch (0.05 mg total) onto the skin once a week. Twice weekly 24 patch 0   fluconazole (DIFLUCAN) 150 MG tablet 1 po x1, may repeat in 3 days prn (Patient not taking: Reported on 04/28/2019) 2 tablet 0   gabapentin (NEURONTIN) 300 MG capsule Take 3 capsules by mouth at bedtime. Takes 1AM + 4HS     glucose blood (ONE TOUCH ULTRA TEST) test strip Test blood sugar once daily. Dx code: 250.00 100 each 12   L-Glutamine 500 MG TABS Take 1 tablet  by mouth daily.     Lactobacillus (ACIDOPHILUS PO) Take 1 capsule by mouth daily. 2 Billion active cultures     lamoTRIgine (LAMICTAL) 100 MG tablet TAKE 1 TABLET EVERY MORNING ALONG WITH 200MG  TABLET TO TOTAL 300MG  PER DAY 90 tablet 0   lamoTRIgine (LAMICTAL) 200 MG tablet Take 1 tablet (200 mg total) by mouth at bedtime. 90 tablet 0   leflunomide (ARAVA) 10 MG tablet Take by mouth.     levofloxacin (LEVAQUIN) 500 MG tablet Take 1 tablet (500 mg total) by mouth daily. (Patient not taking: Reported on 04/28/2019) 7 tablet 0   levonorgestrel (MIRENA) 20 MCG/24HR IUD 1 each by Intrauterine route once.     levothyroxine (SYNTHROID) 50 MCG tablet Take 1 tablet (50 mcg total) by mouth daily before breakfast. 90 tablet 1   lisinopril (ZESTRIL) 5 MG tablet Take 1 tablet (5 mg total) by mouth daily. 90 tablet 1   metFORMIN (GLUCOPHAGE-XR) 500 MG 24 hr tablet TAKE 1 TABLET(500 MG) BY MOUTH EVERY EVENING 90 tablet 1   Multiple Vitamin (MULTIVITAMIN WITH MINERALS) TABS Take 1 tablet by mouth daily.     mupirocin ointment (BACTROBAN) 2 % APPLY TO LESION ON LEFT BREAST TWICE DAILY FOR 14 DAYS  0   naproxen sodium (ANAPROX) 220 MG tablet Take 220 mg by mouth.     ONETOUCH DELICA LANCETS 23J MISC Test blood sugar once daily. Dx code: 250.00 100 each 12   OVER THE COUNTER MEDICATION Vitamin B-12 5000 mcg 1 tablet daily.     OVER THE COUNTER MEDICATION Amberen 2 tablets every morning     oxyCODONE 10 MG TABS Take 1 tablet (10 mg total) by mouth every 6 (six) hours as needed (for pain). 60 tablet 0   Phenylephrine-Acetaminophen (TYLENOL SINUS CONGESTION/PAIN PO) Take 2 tablets by mouth daily as needed (for sinus congestion).      Sarilumab (KEVZARA) 200 MG/1.14ML SOAJ Inject 200 mg into the skin. Every 2 weeks     terconazole (TERAZOL 7) 0.4 % vaginal cream Place 1 applicator vaginally at bedtime. Use for seven days (Patient not taking: Reported on 04/28/2019) 45 g 0   tretinoin (RETIN-A) 0.025 %  gel Apply 3 nights  weekly x 2 weeks can increase to nightly only as tolerated     No current facility-administered medications for this visit.     Treatment Plan Summary: Medication management  1. Depression: She tries to keep her self busy 1 son lives with her she does have some friends mood wise she believes medication is helping her concern is pain for which she will follow-up with pain management  2. Anxiety ; fluctuates continue BuSpar it does help instead of 3 times a day at times she takes it only 2 times a day  3. Insomnia: no change, not worse  4. Back pain:chronic.  Working with providers and regarding to possible surgery and chiropractory   Fu 38m.  Merian Capron, MD  3:07 PM 07/20/2020

## 2020-09-17 ENCOUNTER — Other Ambulatory Visit (HOSPITAL_COMMUNITY): Payer: Self-pay | Admitting: Psychiatry

## 2020-10-20 ENCOUNTER — Encounter (HOSPITAL_COMMUNITY): Payer: Self-pay | Admitting: Psychiatry

## 2020-10-20 ENCOUNTER — Telehealth (INDEPENDENT_AMBULATORY_CARE_PROVIDER_SITE_OTHER): Payer: Medicare PPO | Admitting: Psychiatry

## 2020-10-20 DIAGNOSIS — G8929 Other chronic pain: Secondary | ICD-10-CM

## 2020-10-20 DIAGNOSIS — F331 Major depressive disorder, recurrent, moderate: Secondary | ICD-10-CM

## 2020-10-20 DIAGNOSIS — F411 Generalized anxiety disorder: Secondary | ICD-10-CM | POA: Diagnosis not present

## 2020-10-20 DIAGNOSIS — M549 Dorsalgia, unspecified: Secondary | ICD-10-CM | POA: Diagnosis not present

## 2020-10-20 NOTE — Progress Notes (Signed)
Patient ID: Michelle Mueller, female   DOB: 1965/09/14, 55 y.o.   MRN: [id number]  Psychiatric Outpatient Follow up Visit  Patient Identification: Michelle Mueller MRN:  [id number] Date of Evaluation:  10/20/2020 Referral Source: [name] Chief Complaint:    depression . Follow up Visit Diagnosis: depression, anxiety Patient Active Problem List   Diagnosis Date Noted   Influenza [J11.1] 02/28/2018   Pansinusitis [J32.4] 02/28/2018   Rash [R21] 01/13/2018   Hypothyroidism [E03.9] 01/13/2018   Hyperlipidemia associated with type 2 diabetes mellitus ([place]) [E11.69, E78.5] 07/30/2016   Rheumatoid arthritis of multiple sites with negative rheumatoid factor ([place]) [M06.09] 03/13/2016   Chest pain of uncertain etiology [B63.8] 12/21/2014   Essential hypertension [I10] 12/21/2014   Myalgia [M79.10] 11/10/2014   Insomnia [G47.00] 11/10/2014   UTI (urinary tract infection) [N39.0] 08/04/2013   Fibromyalgia muscle pain [M79.7] 06/13/2013   Severe depression ([place]) [F32.2] 06/13/2013   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, without long-term current use of insulin ([place]) [E11.620] 06/13/2013   Rheumatoid arthritis ([place]) [M06.9] 03/19/2013   Fibromyalgia [M79.7] 03/19/2013   Unspecified hypothyroidism [E03.9] 03/19/2013   Goiter [E04.9] 03/19/2013   Obesity (BMI 30-39.9) [E66.9] 03/19/2013   Pseudoarthrosis of lumbar spine L4-L5 [S32.009K] 06/20/2012   Family history of pancreatic cancer [Z80.0] 01/18/2011   Chronic diarrhea [K52.9] 01/18/2011   Virtual Visit via Video Note  I connected with Michelle Mueller on 10/20/20 at  1:30 PM EDT by a video enabled telemedicine application and verified that I am speaking with the correct person using two identifiers.  Location: Patient: home Provider: home office   I discussed the limitations of evaluation and management by telemedicine and the availability of in person appointments. The patient expressed understanding and agreed  to proceed.      I discussed the assessment and treatment plan with the patient. The patient was provided an opportunity to ask questions and all were answered. The patient agreed with the plan and demonstrated an understanding of the instructions.   The patient was advised to call back or seek an in-person evaluation if the symptoms worsen or if the condition fails to improve as anticipated.  I provided 12 minutes of non-face-to-face time during this encounter.    HPI:  [place] has some friends.  One of her son is living with her it is time to go to be a good.  In regarding depression anxiety she is manageable her back condition and arthritis to the effects of mood she is also being evaluated for a possible pulmonary hypertension or fibrosis  She is taking BuSpar 2 times a day instead of 3 times a day it helps anxiety No rash or side effects on the medication Modifying factor: Friends, son   Past Medical History:  Past Medical History:  Diagnosis Date   Anxiety    Arthritis    Asthma    last attack 3 yrs ago   Chicken pox    Depression    Diabetes mellitus without complication ([place])    Fibromyalgia    Goiter    Hypertension    [name]   Hypothyroidism    Ovarian cyst    PONV (postoperative nausea and vomiting)     Past Surgical History:  Procedure Laterality Date   ABDOMINAL EXPOSURE  12/18/2011   Procedure: ABDOMINAL EXPOSURE;  Surgeon: [name], MD;  Location: [facility];  Service: Vascular;  Laterality: N/A;  Anterior Expossure  for anterior lumbar interbody fuson   ANTERIOR LUMBAR FUSION  12/18/2011   Procedure: ANTERIOR LUMBAR FUSION 1 LEVEL;  Surgeon: [name], MD;  Location: [facility];  Service: Neurosurgery;  Laterality: N/A;  Lumbar four-five Anterior Lumbar Interbody Fusion with Dr. [name] to do anterior exposure   [place]     L breast    TONSILLECTOMY     TUMOR EXCISION     Nerve sheath tumor, R arm   Family History:  Family History  Problem Relation Age of Onset   Heart disease Father    Lung cancer Father    Colon cancer Maternal Aunt    Pancreatic cancer Maternal Grandmother    Pancreatic cancer Maternal Grandfather    Pancreatic cancer Paternal Grandmother    Cancer Paternal Grandfather    Sexual abuse Neg Hx    Social History:   Social History   Socioeconomic History   Marital status: Divorced    Spouse name: Not on file   Number of children: Not on file   Years of education: Not on file   Highest education level: Not on file  Occupational History   Not on file  Tobacco Use   Smoking status: Never   Smokeless tobacco: Never  Vaping Use   Vaping Use: Never used  Substance and Sexual Activity   Alcohol use: No    Alcohol/week: 0.0 standard drinks   Drug use: No   Sexual activity: Not Currently    Birth control/protection: I.U.D., None  Other Topics Concern   Not on file  Social History Narrative   Not on file   Social Determinants of Health   Financial Resource Strain: Not on file  Food Insecurity: Not on file  Transportation Needs: Not on file  Physical Activity: Not on file  Stress: Not on file  Social Connections: Not on file      Psychiatric Specialty Exam:   Depression        Associated symptoms include no suicidal ideas.  Review of Systems  Cardiovascular:  Negative for chest pain.  Musculoskeletal:  Positive for back pain.  Psychiatric/Behavioral:  Negative for substance abuse and suicidal ideas.    There were no vitals taken for this visit.There is no height or weight on file to calculate BMI.  General Appearance: fair  Eye Contact:  Good  Speech:  Clear and Coherent  Volume:  Normal  Mood:fair  Affect: congruent  Thought Process:  Coherent, Goal Directed, Intact, Linear and Logical  Orientation:  Full (Time, Place, and Person)  Thought Content:  WDL  Suicidal  Thoughts:  No  Homicidal Thoughts:  No  Memory:  Immediate;   Good Recent;   Good Remote;   Good  Judgement:  Good  Insight:  Present  Psychomotor Activity:  Decreased  Concentration:  Fair  Recall:  Good  Fund of Knowledge:Good  Language: Good  Akathisia:  No  Handed:  Left  AIMS (if indicated):    Assets:  Communication Skills Desire for Improvement Financial Resources/Insurance Housing Resilience Transportation  ADL's:  Intact  Cognition: WNL  Sleep:  poor    Allergies:   Allergies  Allergen Reactions   Erythromycin Other (See Comments)    arrhythmia   Zocor  [Simvastatin-High Dose] Rash   Doxycycline Other (See Comments)    Decreased BP and caused dizziness per patient   Peroxide [Hydrogen Peroxide]  Current Medications: Current Outpatient Medications  Medication Sig Dispense Refill   atorvastatin (LIPITOR) 10 MG tablet TAKE 1 TABLET(10 MG) BY MOUTH DAILY 90 tablet 1   buPROPion (WELLBUTRIN XL) 300 MG 24 hr tablet TAKE 1 TABLET EVERY DAY 90 tablet 0   busPIRone (BUSPAR) 10 MG tablet TAKE 1 TABLET THREE TIMES DAILY 180 tablet 0   Calcium-Magnesium-Vitamin D (CALCIUM MAGNESIUM PO) Take 1 tablet by mouth daily.     cetirizine (ZYRTEC) 10 MG tablet Take 10 mg by mouth daily.      Cholecalciferol (VITAMIN D) 2000 units CAPS Take 1 capsule by mouth daily.     clotrimazole-betamethasone (LOTRISONE) cream Apply 1 application topically 2 (two) times daily. 30 g 0   cyclobenzaprine (FLEXERIL) 5 MG tablet   1   estradiol (CLIMARA) 0.05 mg/24hr patch Place 1 patch (0.05 mg total) onto the skin once a week. Twice weekly 24 patch 0   fluconazole (DIFLUCAN) 150 MG tablet 1 po x1, may repeat in 3 days prn (Patient not taking: Reported on 04/28/2019) 2 tablet 0   gabapentin (NEURONTIN) 300 MG capsule Take 3 capsules by mouth at bedtime. Takes 1AM + 4HS     glucose blood (ONE TOUCH ULTRA TEST) test strip Test blood sugar once daily. Dx code: 250.00 100 each 12   L-Glutamine 500  MG TABS Take 1 tablet by mouth daily.     Lactobacillus (ACIDOPHILUS PO) Take 1 capsule by mouth daily. 2 Billion active cultures     lamoTRIgine (LAMICTAL) 100 MG tablet TAKE 1 TABLET EVERY MORNING ALONG WITH 200MG  TABLET TO TOTAL 300MG  PER DAY 90 tablet 0   lamoTRIgine (LAMICTAL) 200 MG tablet TAKE 1 TABLET (200 MG TOTAL) BY MOUTH AT BEDTIME. 90 tablet 0   leflunomide (ARAVA) 10 MG tablet Take by mouth.     levofloxacin (LEVAQUIN) 500 MG tablet Take 1 tablet (500 mg total) by mouth daily. (Patient not taking: Reported on 04/28/2019) 7 tablet 0   levonorgestrel (MIRENA) 20 MCG/24HR IUD 1 each by Intrauterine route once.     levothyroxine (SYNTHROID) 50 MCG tablet Take 1 tablet (50 mcg total) by mouth daily before breakfast. 90 tablet 1   lisinopril (ZESTRIL) 5 MG tablet Take 1 tablet (5 mg total) by mouth daily. 90 tablet 1   metFORMIN (GLUCOPHAGE-XR) 500 MG 24 hr tablet TAKE 1 TABLET(500 MG) BY MOUTH EVERY EVENING 90 tablet 1   Multiple Vitamin (MULTIVITAMIN WITH MINERALS) TABS Take 1 tablet by mouth daily.     mupirocin ointment (BACTROBAN) 2 % APPLY TO LESION ON LEFT BREAST TWICE DAILY FOR 14 DAYS  0   naproxen sodium (ANAPROX) 220 MG tablet Take 220 mg by mouth.     ONETOUCH DELICA LANCETS 16X MISC Test blood sugar once daily. Dx code: 250.00 100 each 12   OVER THE COUNTER MEDICATION Vitamin B-12 5000 mcg 1 tablet daily.     OVER THE COUNTER MEDICATION Amberen 2 tablets every morning     oxyCODONE 10 MG TABS Take 1 tablet (10 mg total) by mouth every 6 (six) hours as needed (for pain). 60 tablet 0   Phenylephrine-Acetaminophen (TYLENOL SINUS CONGESTION/PAIN PO) Take 2 tablets by mouth daily as needed (for sinus congestion).      Sarilumab (KEVZARA) 200 MG/1.14ML SOAJ Inject 200 mg into the skin. Every 2 weeks     terconazole (TERAZOL 7) 0.4 % vaginal cream Place 1 applicator vaginally at bedtime. Use for seven days (Patient not taking: Reported on 04/28/2019) 45 g  0   tretinoin (RETIN-A) 0.025  % gel Apply 3 nights weekly x 2 weeks can increase to nightly only as tolerated     No current facility-administered medications for this visit.     Treatment Plan Summary: Medication management Prior documentation reviewed 1. Depression: Improving continue current medications including Wellbutrin, Lamictal 2. Anxiety ; better continue BuSpar now taking 2 times a day  3. Insomnia: no change, not worse  4. Back pain:chronic.  Working with providers   Fu 64m.  Merian Capron, MD  1:43 PM 10/20/2020

## 2020-11-23 ENCOUNTER — Other Ambulatory Visit (HOSPITAL_COMMUNITY): Payer: Self-pay | Admitting: Psychiatry

## 2021-02-04 ENCOUNTER — Telehealth (HOSPITAL_COMMUNITY): Payer: Medicare PPO | Admitting: Psychiatry

## 2021-02-16 ENCOUNTER — Telehealth (INDEPENDENT_AMBULATORY_CARE_PROVIDER_SITE_OTHER): Payer: Medicare PPO | Admitting: Psychiatry

## 2021-02-16 ENCOUNTER — Encounter (HOSPITAL_COMMUNITY): Payer: Self-pay | Admitting: Psychiatry

## 2021-02-16 DIAGNOSIS — G8929 Other chronic pain: Secondary | ICD-10-CM | POA: Diagnosis not present

## 2021-02-16 DIAGNOSIS — F411 Generalized anxiety disorder: Secondary | ICD-10-CM | POA: Diagnosis not present

## 2021-02-16 DIAGNOSIS — F331 Major depressive disorder, recurrent, moderate: Secondary | ICD-10-CM

## 2021-02-16 DIAGNOSIS — M549 Dorsalgia, unspecified: Secondary | ICD-10-CM

## 2021-02-16 MED ORDER — BUPROPION HCL ER (XL) 300 MG PO TB24
ORAL_TABLET | ORAL | 0 refills | Status: DC
Start: 2021-02-16 — End: 2022-04-05

## 2021-02-16 MED ORDER — LAMOTRIGINE 200 MG PO TABS: 200.0000 mg | ORAL_TABLET | Freq: Every day | ORAL | 0 refills | Status: DC

## 2021-02-16 MED ORDER — BUSPIRONE HCL 10 MG PO TABS: 10.0000 mg | ORAL_TABLET | Freq: Three times a day (TID) | ORAL | 0 refills | Status: DC

## 2021-02-16 MED ORDER — LAMOTRIGINE 100 MG PO TABS: ORAL_TABLET | ORAL | 0 refills | Status: DC
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
## 2021-05-01 ENCOUNTER — Other Ambulatory Visit (HOSPITAL_COMMUNITY): Payer: Self-pay | Admitting: Psychiatry

## 2021-05-11 ENCOUNTER — Telehealth (HOSPITAL_COMMUNITY): Payer: Medicare PPO | Admitting: Psychiatry

## 2021-05-18 ENCOUNTER — Telehealth (INDEPENDENT_AMBULATORY_CARE_PROVIDER_SITE_OTHER): Payer: Medicare PPO | Admitting: Psychiatry

## 2021-05-18 ENCOUNTER — Encounter (HOSPITAL_COMMUNITY): Payer: Self-pay | Admitting: Psychiatry

## 2021-05-18 DIAGNOSIS — F331 Major depressive disorder, recurrent, moderate: Secondary | ICD-10-CM

## 2021-05-18 DIAGNOSIS — F411 Generalized anxiety disorder: Secondary | ICD-10-CM

## 2021-05-18 DIAGNOSIS — M549 Dorsalgia, unspecified: Secondary | ICD-10-CM | POA: Diagnosis not present

## 2021-05-18 DIAGNOSIS — G8929 Other chronic pain: Secondary | ICD-10-CM

## 2021-05-18 NOTE — Progress Notes (Signed)
Patient ID: Michelle Mueller, female   DOB: [birth year], 56 y.o.   MRN: [id number] ? Psychiatric Outpatient Follow up Visit ? ?Patient Identification: Michelle Mueller ?MRN:  [id number] ?Date of Evaluation:  05/18/2021 ?Referral Source: Dr. Kendrick Fries Lown ?Chief Complaint:   ? depression . Follow up ?Visit Diagnosis: depression, anxiety ?Patient Active Problem List  ? Diagnosis Date Noted  ? Influenza [J11.1] 02/28/2018  ? Pansinusitis [J32.4] 02/28/2018  ? Rash [R21] 01/13/2018  ? Hypothyroidism [E03.9] 01/13/2018  ? Hyperlipidemia associated with type 2 diabetes mellitus ([place]) [E11.69, E78.5] 07/30/2016  ? Rheumatoid arthritis of multiple sites with negative rheumatoid factor ([place]) [M06.09] 03/13/2016  ? Chest pain of uncertain etiology [D78.2] 12/21/2014  ? Essential hypertension [I10] 12/21/2014  ? Myalgia [M79.10] 11/10/2014  ? Insomnia [G47.00] 11/10/2014  ? UTI (urinary tract infection) [N39.0] 08/04/2013  ? Fibromyalgia muscle pain [M79.7] 06/13/2013  ? Severe depression ([place]) [F32.2] 06/13/2013  ? 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 Rheumatoid arthritis ([place]) [M06.9] 03/19/2013  ? Fibromyalgia [M79.7] 03/19/2013  ? Unspecified hypothyroidism [E03.9] 03/19/2013  ? Goiter [E04.9] 03/19/2013  ? Obesity (BMI 30-39.9) [E66.9] 03/19/2013  ? Pseudoarthrosis of lumbar spine L4-L5 [S32.009K] 06/20/2012  ? Family history of pancreatic cancer [Z80.0] 01/18/2011  ? Chronic diarrhea [K52.9] 01/18/2011  ? ? ?Virtual Visit via Video Note ? ?I connected with Michelle Mueller on 05/18/21 at  1:15 PM EDT by a video enabled telemedicine application and verified that I am speaking with the correct person using two identifiers. ? ?Location: ?Patient: home ?Provider: home office ?  ?I discussed the limitations of evaluation and management by telemedicine and the availability of in person appointments. The patient expressed understanding and agreed  to proceed. ? ?  ?I discussed the assessment and treatment plan with the patient. The patient was provided an opportunity to ask questions and all were answered. The patient agreed with the plan and demonstrated an understanding of the instructions. ?  ?The patient was advised to call back or seek an in-person evaluation if the symptoms worsen or if the condition fails to improve as anticipated. ? ?I provided 15 minutes of non-face-to-face time during this encounter. ? ? ? ? ?HPI:  ? ?Doing fair, shifting to alternate med for RA, due to past side effects ?Have support of her sons and keeps busy with gardening ? ?No rash ? ?Modifying factor:friends, son ?Duration : adult life ? ? ?Past Medical History:  ?Past Medical History:  ?Diagnosis Date  ? Anxiety   ? Arthritis   ? Asthma   ? last attack 3 yrs ago  ? Chicken pox   ? Depression   ? Diabetes mellitus without complication ([place])   ? Fibromyalgia   ? Goiter   ? Hypertension   ? [name]  ? Hypothyroidism   ? Ovarian cyst   ? PONV (postoperative nausea and vomiting)   ?  ?Past Surgical History:  ?Procedure Laterality Date  ? ABDOMINAL EXPOSURE  12/18/2011  ? Procedure: ABDOMINAL EXPOSURE;  Surgeon: [name], MD;  Location: [facility];  Service: Vascular;  Laterality: N/A;  Anterior Expossure for anterior lumbar interbody fuson  ? ANTERIOR LUMBAR FUSION  12/18/2011  ? Procedure: ANTERIOR LUMBAR FUSION 1 LEVEL;  Surgeon: [name], MD;  Location: [facility];  Service: Neurosurgery;  Laterality: N/A;  Lumbar four-five Anterior Lumbar Interbody Fusion with Dr. [name] to do anterior exposure  ? CESAREAN SECTION    ?  CHOLECYSTECTOMY    ? HERNIA REPAIR    ? LIPOMA EXCISION    ? L breast  ? TONSILLECTOMY    ? TUMOR EXCISION    ? Nerve sheath tumor, R arm  ? ?Family History:  ?Family History  ?Problem Relation Age of Onset  ? Heart disease Father   ? Lung cancer Father   ? Colon cancer Maternal Aunt   ? Pancreatic cancer Maternal Grandmother   ? Pancreatic cancer  Maternal Grandfather   ? Pancreatic cancer Paternal Grandmother   ? Cancer Paternal Grandfather   ? Sexual abuse Neg Hx   ? ?Social History:   ?Social History  ? ?Socioeconomic History  ? Marital status: Divorced  ?  Spouse name: Not on file  ? Number of children: Not on file  ? Years of education: Not on file  ? Highest education level: Not on file  ?Occupational History  ? Not on file  ?Tobacco Use  ? Smoking status: Never  ? Smokeless tobacco: Never  ?Vaping Use  ? Vaping Use: Never used  ?Substance and Sexual Activity  ? Alcohol use: No  ?  Alcohol/week: 0.0 standard drinks  ? Drug use: No  ? Sexual activity: Not Currently  ?  Birth control/protection: I.U.D., None  ?Other Topics Concern  ? Not on file  ?Social History Narrative  ? Not on file  ? ?Social Determinants of Health  ? ?Financial Resource Strain: Not on file  ?Food Insecurity: Not on file  ?Transportation Needs: Not on file  ?Physical Activity: Not on file  ?Stress: Not on file  ?Social Connections: Not on file  ? ? ? ? ?Psychiatric Specialty Exam: ? ? ?Depression ?       Associated symptoms include myalgias.  Associated symptoms include no suicidal ideas.  ?Review of Systems  ?Cardiovascular:  Negative for chest pain and palpitations.  ?Musculoskeletal:  Positive for myalgias.  ?Psychiatric/Behavioral:  Negative for substance abuse and suicidal ideas.    ?There were no vitals taken for this visit.There is no height or weight on file to calculate BMI.  ?General Appearance: fair  ?Eye Contact:  Good  ?Speech:  Clear and Coherent  ?Volume:  Normal  ?Mood: fair  ?Affect: congruent  ?Thought Process:  Coherent, Goal Directed, Intact, Linear and Logical  ?Orientation:  Full (Time, Place, and Person)  ?Thought Content:  WDL  ?Suicidal Thoughts:  No  ?Homicidal Thoughts:  No  ?Memory:  Immediate;   Good ?Recent;   Good ?Remote;   Good  ?Judgement:  Good  ?Insight:  Present  ?Psychomotor Activity:  Decreased  ?Concentration:  Fair  ?Recall:  Good  ?Fund of  [place]  ?Language: Good  ?Akathisia:  No  ?Handed:  Left  ?AIMS (if indicated):    ?Assets:  Communication Skills ?Desire for Improvement ?Financial Resources/Insurance ?Housing ?Resilience ?Transportation  ?ADL's:  Intact  ?Cognition: WNL  ?Sleep:  poor  ? ? ?Allergies:   ?Allergies  ?Allergen Reactions  ? Erythromycin Other (See Comments)  ?  arrhythmia  ? Zocor  [Simvastatin-High Dose] Rash  ? Doxycycline Other (See Comments)  ?  Decreased BP and caused dizziness per patient  ? Peroxide [Hydrogen Peroxide]   ? ?Current Medications: ?Current Outpatient Medications  ?Medication Sig Dispense Refill  ? atorvastatin (LIPITOR) 10 MG tablet TAKE 1 TABLET(10 MG) BY MOUTH DAILY 90 tablet 1  ? buPROPion (WELLBUTRIN XL) 300 MG 24 hr tablet TAKE 1 TABLET EVERY DAY 90 tablet 0  ? busPIRone (BUSPAR) 10  MG tablet Take 1 tablet (10 mg total) by mouth 3 (three) times daily. 270 tablet 0  ? Calcium-Magnesium-Vitamin D (CALCIUM MAGNESIUM PO) Take 1 tablet by mouth daily.    ? cetirizine (ZYRTEC) 10 MG tablet Take 10 mg by mouth daily.     ? Cholecalciferol (VITAMIN D) 2000 units CAPS Take 1 capsule by mouth daily.    ? clotrimazole-betamethasone (LOTRISONE) cream Apply 1 application topically 2 (two) times daily. 30 g 0  ? cyclobenzaprine (FLEXERIL) 5 MG tablet   1  ? estradiol (CLIMARA) 0.05 mg/24hr patch Place 1 patch (0.05 mg total) onto the skin once a week. Twice weekly 24 patch 0  ? fluconazole (DIFLUCAN) 150 MG tablet 1 po x1, may repeat in 3 days prn (Patient not taking: Reported on 04/28/2019) 2 tablet 0  ? gabapentin (NEURONTIN) 300 MG capsule Take 3 capsules by mouth at bedtime. Takes 1AM + 4HS    ? glucose blood (ONE TOUCH ULTRA TEST) test strip Test blood sugar once daily. Dx code: 250.00 100 each 12  ? L-Glutamine 500 MG TABS Take 1 tablet by mouth daily.    ? Lactobacillus (ACIDOPHILUS PO) Take 1 capsule by mouth daily. 2 Billion active cultures    ? lamoTRIgine (LAMICTAL) 100 MG tablet TAKE 1 TABLET ONCE A  DAY IN ADDITION TO '200MG'$  TABLET DAILY 90 tablet 0  ? lamoTRIgine (LAMICTAL) 200 MG tablet TAKE 1 TABLET AT BEDTIME 90 tablet 0  ? leflunomide (ARAVA) 10 MG tablet Take by mouth.    ? levofloxacin (LEVAQUIN

## 2021-08-04 ENCOUNTER — Other Ambulatory Visit (HOSPITAL_COMMUNITY): Payer: Self-pay | Admitting: Psychiatry

## 2021-08-12 ENCOUNTER — Other Ambulatory Visit (HOSPITAL_COMMUNITY): Payer: Self-pay | Admitting: Psychiatry

## 2021-09-07 ENCOUNTER — Encounter (HOSPITAL_COMMUNITY): Payer: Self-pay | Admitting: Psychiatry

## 2021-09-07 ENCOUNTER — Telehealth (INDEPENDENT_AMBULATORY_CARE_PROVIDER_SITE_OTHER): Payer: Medicare PPO | Admitting: Psychiatry

## 2021-09-07 DIAGNOSIS — F411 Generalized anxiety disorder: Secondary | ICD-10-CM | POA: Diagnosis not present

## 2021-09-07 DIAGNOSIS — M549 Dorsalgia, unspecified: Secondary | ICD-10-CM | POA: Diagnosis not present

## 2021-09-07 DIAGNOSIS — F331 Major depressive disorder, recurrent, moderate: Secondary | ICD-10-CM

## 2021-09-07 DIAGNOSIS — G8929 Other chronic pain: Secondary | ICD-10-CM | POA: Diagnosis not present

## 2021-09-07 NOTE — Progress Notes (Signed)
Patient ID: Michelle Mueller, female   DOB: May 14, 1965, 56 y.o.   MRN: [id number]  Psychiatric Outpatient Follow up Visit  Patient Identification: ANJALINA BERGEVIN MRN:  [id number] Date of Evaluation:  09/07/2021 Referral Source: [name] Chief Complaint:    depression . Follow up Visit Diagnosis: depression, anxiety Patient Active Problem List   Diagnosis Date Noted   Influenza [J11.1] 02/28/2018   Pansinusitis [J32.4] 02/28/2018   Rash [R21] 01/13/2018   Hypothyroidism [E03.9] 01/13/2018   Hyperlipidemia associated with type 2 diabetes mellitus ([place]) [E11.69, E78.5] 07/30/2016   Rheumatoid arthritis of multiple sites with negative rheumatoid factor ([place]) [M06.09] 03/13/2016   Chest pain of uncertain etiology [V76.1] 12/21/2014   Essential hypertension [I10] 12/21/2014   Myalgia [M79.10] 11/10/2014   Insomnia [G47.00] 11/10/2014   UTI (urinary tract infection) [N39.0] 08/04/2013   Fibromyalgia muscle pain [M79.7] 06/13/2013   Severe depression ([place]) [F32.2] 06/13/2013   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, without long-term current use of insulin ([place]) [E11.620] 06/13/2013   Rheumatoid arthritis ([place]) [M06.9] 03/19/2013   Fibromyalgia [M79.7] 03/19/2013   Unspecified hypothyroidism [E03.9] 03/19/2013   Goiter [E04.9] 03/19/2013   Obesity (BMI 30-39.9) [E66.9] 03/19/2013   Pseudoarthrosis of lumbar spine L4-L5 [S32.009K] 06/20/2012   Family history of pancreatic cancer [Z80.0] 01/18/2011   Chronic diarrhea [K52.9] 01/18/2011    Virtual Visit via Video Note  I connected with Michelle Mueller on 09/07/21 at  1:30 PM EDT by a video enabled telemedicine application and verified that I am speaking with the correct person using two identifiers.  Location: Patient: home Provider: home office   I discussed the limitations of evaluation and management by telemedicine and the availability of in person appointments. The patient expressed understanding and agreed  to proceed.    I discussed the assessment and treatment plan with the patient. The patient was provided an opportunity to ask questions and all were answered. The patient agreed with the plan and demonstrated an understanding of the instructions.   The patient was advised to call back or seek an in-person evaluation if the symptoms worsen or if the condition fails to improve as anticipated.  I provided 15 minutes of non-face-to-face time during this encounter.     HPI:   Pain effects mood, trying to find a RA med that she feels comfortable with and not have concerning damaging side effects  RA flare ups lead to depression but ovearll she is satsified with psych meds and not want to change   Modifying factor:friends, son Duration : adult life Severity : effected by RA flare ups   Past Medical History:  Past Medical History:  Diagnosis Date   Anxiety    Arthritis    Asthma    last attack 3 yrs ago   Chicken pox    Depression    Diabetes mellitus without complication ([place])    Fibromyalgia    Goiter    Hypertension    [name]   Hypothyroidism    Ovarian cyst    PONV (postoperative nausea and vomiting)     Past Surgical History:  Procedure Laterality Date   ABDOMINAL EXPOSURE  12/18/2011   Procedure: ABDOMINAL EXPOSURE;  Surgeon: [name], MD;  Location: [facility];  Service: Vascular;  Laterality: N/A;  Anterior Expossure for anterior lumbar interbody fuson   ANTERIOR LUMBAR FUSION  12/18/2011   Procedure: ANTERIOR LUMBAR FUSION 1 LEVEL;  Surgeon: [name], MD;  Location: [facility];  Service: Neurosurgery;  Laterality: N/A;  Lumbar four-five Anterior Lumbar Interbody Fusion with Dr. [name] to do anterior exposure   [place]     L breast   TONSILLECTOMY     TUMOR EXCISION     Nerve sheath tumor, R arm   Family History:  Family History  Problem Relation Age of Onset   Heart disease  Father    Lung cancer Father    Colon cancer Maternal Aunt    Pancreatic cancer Maternal Grandmother    Pancreatic cancer Maternal Grandfather    Pancreatic cancer Paternal Grandmother    Cancer Paternal Grandfather    Sexual abuse Neg Hx    Social History:   Social History   Socioeconomic History   Marital status: Divorced    Spouse name: Not on file   Number of children: Not on file   Years of education: Not on file   Highest education level: Not on file  Occupational History   Not on file  Tobacco Use   Smoking status: Never   Smokeless tobacco: Never  Vaping Use   Vaping Use: Never used  Substance and Sexual Activity   Alcohol use: No    Alcohol/week: 0.0 standard drinks of alcohol   Drug use: No   Sexual activity: Not Currently    Birth control/protection: I.U.D., None  Other Topics Concern   Not on file  Social History Narrative   Not on file   Social Determinants of Health   Financial Resource Strain: Not on file  Food Insecurity: Not on file  Transportation Needs: Not on file  Physical Activity: Not on file  Stress: Not on file  Social Connections: Not on file      Psychiatric Specialty Exam:   Depression        Associated symptoms include myalgias.  Associated symptoms include no suicidal ideas.   Review of Systems  Cardiovascular:  Negative for chest pain and palpitations.  Musculoskeletal:  Positive for myalgias.  Psychiatric/Behavioral:  Negative for substance abuse and suicidal ideas.     There were no vitals taken for this visit.There is no height or weight on file to calculate BMI.  General Appearance: fair  Eye Contact:  Good  Speech:  Clear and Coherent  Volume:  Normal  Mood: somewhat stress  Affect: congruent  Thought Process:  Coherent, Goal Directed, Intact, Linear and Logical  Orientation:  Full (Time, Place, and Person)  Thought Content:  WDL  Suicidal Thoughts:  No  Homicidal Thoughts:  No  Memory:  Immediate;    Good Recent;   Good Remote;   Good  Judgement:  Good  Insight:  Present  Psychomotor Activity:  Decreased  Concentration:  Fair  Recall:  Good  Fund of Knowledge:Good  Language: Good  Akathisia:  No  Handed:  Left  AIMS (if indicated):    Assets:  Communication Skills Desire for Improvement Financial Resources/Insurance Housing Resilience Transportation  ADL's:  Intact  Cognition: WNL  Sleep:  poor    Allergies:   Allergies  Allergen Reactions   Erythromycin Other (See Comments)    arrhythmia   Zocor  [Simvastatin-High Dose] Rash   Doxycycline Other (See Comments)    Decreased BP and caused dizziness per patient   Peroxide [Hydrogen Peroxide]    Current Medications: Current Outpatient Medications  Medication Sig Dispense Refill   atorvastatin (LIPITOR) 10 MG tablet TAKE  1 TABLET(10 MG) BY MOUTH DAILY 90 tablet 1   buPROPion (WELLBUTRIN XL) 300 MG 24 hr tablet TAKE 1 TABLET EVERY DAY 90 tablet 0   busPIRone (BUSPAR) 10 MG tablet TAKE 1 TABLET THREE TIMES DAILY 270 tablet 0   Calcium-Magnesium-Vitamin D (CALCIUM MAGNESIUM PO) Take 1 tablet by mouth daily.     cetirizine (ZYRTEC) 10 MG tablet Take 10 mg by mouth daily.      Cholecalciferol (VITAMIN D) 2000 units CAPS Take 1 capsule by mouth daily.     clotrimazole-betamethasone (LOTRISONE) cream Apply 1 application topically 2 (two) times daily. 30 g 0   cyclobenzaprine (FLEXERIL) 5 MG tablet   1   estradiol (CLIMARA) 0.05 mg/24hr patch Place 1 patch (0.05 mg total) onto the skin once a week. Twice weekly 24 patch 0   fluconazole (DIFLUCAN) 150 MG tablet 1 po x1, may repeat in 3 days prn (Patient not taking: Reported on 04/28/2019) 2 tablet 0   gabapentin (NEURONTIN) 300 MG capsule Take 3 capsules by mouth at bedtime. Takes 1AM + 4HS     glucose blood (ONE TOUCH ULTRA TEST) test strip Test blood sugar once daily. Dx code: 250.00 100 each 12   L-Glutamine 500 MG TABS Take 1 tablet by mouth daily.     Lactobacillus  (ACIDOPHILUS PO) Take 1 capsule by mouth daily. 2 Billion active cultures     lamoTRIgine (LAMICTAL) 100 MG tablet TAKE 1 TABLET DAILY IN ADDITION TO '200MG'$  TABLET DAILY 90 tablet 0   lamoTRIgine (LAMICTAL) 200 MG tablet TAKE 1 TABLET AT BEDTIME 90 tablet 0   leflunomide (ARAVA) 10 MG tablet Take by mouth.     levofloxacin (LEVAQUIN) 500 MG tablet Take 1 tablet (500 mg total) by mouth daily. (Patient not taking: Reported on 04/28/2019) 7 tablet 0   levonorgestrel (MIRENA) 20 MCG/24HR IUD 1 each by Intrauterine route once.     levothyroxine (SYNTHROID) 50 MCG tablet Take 1 tablet (50 mcg total) by mouth daily before breakfast. 90 tablet 1   lisinopril (ZESTRIL) 5 MG tablet Take 1 tablet (5 mg total) by mouth daily. 90 tablet 1   metFORMIN (GLUCOPHAGE-XR) 500 MG 24 hr tablet TAKE 1 TABLET(500 MG) BY MOUTH EVERY EVENING 90 tablet 1   Multiple Vitamin (MULTIVITAMIN WITH MINERALS) TABS Take 1 tablet by mouth daily.     mupirocin ointment (BACTROBAN) 2 % APPLY TO LESION ON LEFT BREAST TWICE DAILY FOR 14 DAYS  0   naproxen sodium (ANAPROX) 220 MG tablet Take 220 mg by mouth.     ONETOUCH DELICA LANCETS 83M MISC Test blood sugar once daily. Dx code: 250.00 100 each 12   OVER THE COUNTER MEDICATION Vitamin B-12 5000 mcg 1 tablet daily.     OVER THE COUNTER MEDICATION Amberen 2 tablets every morning     oxyCODONE 10 MG TABS Take 1 tablet (10 mg total) by mouth every 6 (six) hours as needed (for pain). 60 tablet 0   Phenylephrine-Acetaminophen (TYLENOL SINUS CONGESTION/PAIN PO) Take 2 tablets by mouth daily as needed (for sinus congestion).      Sarilumab (KEVZARA) 200 MG/1.14ML SOAJ Inject 200 mg into the skin. Every 2 weeks     terconazole (TERAZOL 7) 0.4 % vaginal cream Place 1 applicator vaginally at bedtime. Use for seven days (Patient not taking: Reported on 04/28/2019) 45 g 0   tretinoin (RETIN-A) 0.025 % gel Apply 3 nights weekly x 2 weeks can increase to nightly only as tolerated     No  current  facility-administered medications for this visit.     Treatment Plan Summary: Medication management Prior documentation reviewed    1. Depression: pain or RA flare up gets her down then she gets better overall does not want to change meds, continue wellbutrin, lamictal  . No rash  2. Anxiety ; fluctuates, continue buspar   3. Insomnia: fluctuates, continue work on sleep hygiene  4. Back pain:chronic.  Working with providers following with [facility] for pain and RA, have shifted to alternate med for RA or other options which may need approval   Fu 98m  NMerian Capron MD  1:43 PM 09/07/2021

## 2021-12-14 ENCOUNTER — Encounter (HOSPITAL_COMMUNITY): Payer: Self-pay | Admitting: Psychiatry

## 2021-12-14 ENCOUNTER — Telehealth (INDEPENDENT_AMBULATORY_CARE_PROVIDER_SITE_OTHER): Payer: Medicare PPO | Admitting: Psychiatry

## 2021-12-14 DIAGNOSIS — F331 Major depressive disorder, recurrent, moderate: Secondary | ICD-10-CM

## 2021-12-14 DIAGNOSIS — M549 Dorsalgia, unspecified: Secondary | ICD-10-CM | POA: Diagnosis not present

## 2021-12-14 DIAGNOSIS — G8929 Other chronic pain: Secondary | ICD-10-CM | POA: Diagnosis not present

## 2021-12-14 DIAGNOSIS — F411 Generalized anxiety disorder: Secondary | ICD-10-CM | POA: Diagnosis not present

## 2021-12-14 NOTE — Progress Notes (Signed)
Patient ID: Michelle Mueller, female   DOB: 01-06-1966, 56 y.o.   MRN: [id number]  Psychiatric Outpatient Follow up Visit  Patient Identification: Michelle Mueller MRN:  [id number] Date of Evaluation:  12/14/2021 Referral Source: [name] Chief Complaint:    depression . Follow up Visit Diagnosis: depression, anxiety Patient Active Problem List   Diagnosis Date Noted   Influenza [J11.1] 02/28/2018   Pansinusitis [J32.4] 02/28/2018   Rash [R21] 01/13/2018   Hypothyroidism [E03.9] 01/13/2018   Hyperlipidemia associated with type 2 diabetes mellitus ([place]) [E11.69, E78.5] 07/30/2016   Rheumatoid arthritis of multiple sites with negative rheumatoid factor ([place]) [M06.09] 03/13/2016   Chest pain of uncertain etiology [Y80.1] 12/21/2014   Essential hypertension [I10] 12/21/2014   Myalgia [M79.10] 11/10/2014   Insomnia [G47.00] 11/10/2014   UTI (urinary tract infection) [N39.0] 08/04/2013   Fibromyalgia muscle pain [M79.7] 06/13/2013   Severe depression ([place]) [F32.2] 06/13/2013   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, without long-term current use of insulin ([place]) [E11.620] 06/13/2013   Rheumatoid arthritis ([place]) [M06.9] 03/19/2013   Fibromyalgia [M79.7] 03/19/2013   Unspecified hypothyroidism [E03.9] 03/19/2013   Goiter [E04.9] 03/19/2013   Obesity (BMI 30-39.9) [E66.9] 03/19/2013   Pseudoarthrosis of lumbar spine L4-L5 [S32.009K] 06/20/2012   Family history of pancreatic cancer [Z80.0] 01/18/2011   Chronic diarrhea [K52.9] 01/18/2011    Virtual Visit via Video Note  I connected with Michelle Mueller on 12/14/21 at  1:30 PM EST by a video enabled telemedicine application and verified that I am speaking with the correct person using two identifiers.  Location: Patient: home Provider: home office   I discussed the limitations of evaluation and management by telemedicine and the availability of in person appointments. The patient expressed understanding and  agreed to proceed.    I discussed the assessment and treatment plan with the patient. The patient was provided an opportunity to ask questions and all were answered. The patient agreed with the plan and demonstrated an understanding of the instructions.   The patient was advised to call back or seek an in-person evaluation if the symptoms worsen or if the condition fails to improve as anticipated.  I provided 15 - 20 minutes of non-face-to-face time during this encounter.     HPI:  Patient dad was terminal he asked her to visit yesterday in hospital, there has been a disconnect for last 7 years. Atleast there was closure yesterday but still can get dwell on negative with mom and why it happned they pushed her aside when she got diagnosed with RA  7 years ago  Overall med keep balance, has a supportive son Back on same RA med for now  Talks to sister that helps  Pain effects mood, trying to find a RA med that she feels comfortable with and not have concerning damaging side effects  RA flare ups lead to depression but ovearll she is satsified with psych meds and not want to change   Modifying factor:friends, son Duration : adult life Severity : effected by RA flare ups   Past Medical History:  Past Medical History:  Diagnosis Date   Anxiety    Arthritis    Asthma    last attack 3 yrs ago   Chicken pox    Depression    Diabetes mellitus without complication ([place])    Fibromyalgia    Goiter    Hypertension    [name]   Hypothyroidism    Ovarian cyst    PONV (  postoperative nausea and vomiting)     Past Surgical History:  Procedure Laterality Date   ABDOMINAL EXPOSURE  12/18/2011   Procedure: ABDOMINAL EXPOSURE;  Surgeon: [name], MD;  Location: [facility];  Service: Vascular;  Laterality: N/A;  Anterior Expossure for anterior lumbar interbody fuson   ANTERIOR LUMBAR FUSION  12/18/2011   Procedure: ANTERIOR LUMBAR FUSION 1 LEVEL;  Surgeon: [name], MD;   Location: [facility];  Service: Neurosurgery;  Laterality: N/A;  Lumbar four-five Anterior Lumbar Interbody Fusion with Dr. [name] to do anterior exposure   [place]     L breast   TONSILLECTOMY     TUMOR EXCISION     Nerve sheath tumor, R arm   Family History:  Family History  Problem Relation Age of Onset   Heart disease Father    Lung cancer Father    Colon cancer Maternal Aunt    Pancreatic cancer Maternal Grandmother    Pancreatic cancer Maternal Grandfather    Pancreatic cancer Paternal Grandmother    Cancer Paternal Grandfather    Sexual abuse Neg Hx    Social History:   Social History   Socioeconomic History   Marital status: Divorced    Spouse name: Not on file   Number of children: Not on file   Years of education: Not on file   Highest education level: Not on file  Occupational History   Not on file  Tobacco Use   Smoking status: Never   Smokeless tobacco: Never  Vaping Use   Vaping Use: Never used  Substance and Sexual Activity   Alcohol use: No    Alcohol/week: 0.0 standard drinks of alcohol   Drug use: No   Sexual activity: Not Currently    Birth control/protection: I.U.D., None  Other Topics Concern   Not on file  Social History Narrative   Not on file   Social Determinants of Health   Financial Resource Strain: Not on file  Food Insecurity: Not on file  Transportation Needs: Not on file  Physical Activity: Not on file  Stress: Not on file  Social Connections: Not on file      Psychiatric Specialty Exam:   Depression        Associated symptoms include myalgias.  Associated symptoms include no suicidal ideas.   Review of Systems  Cardiovascular:  Negative for chest pain and palpitations.  Musculoskeletal:  Positive for myalgias.  Psychiatric/Behavioral:  Positive for depression. Negative for substance abuse and suicidal ideas.     There were no vitals taken for this  visit.There is no height or weight on file to calculate BMI.  General Appearance: fair  Eye Contact:  Good  Speech:  Clear and Coherent  Volume:  Normal  Mood: somewhat stress  Affect: congruent  Thought Process:  Coherent, Goal Directed, Intact, Linear and Logical  Orientation:  Full (Time, Place, and Person)  Thought Content:  WDL  Suicidal Thoughts:  No  Homicidal Thoughts:  No  Memory:  Immediate;   Good Recent;   Good Remote;   Good  Judgement:  Good  Insight:  Present  Psychomotor Activity:  Decreased  Concentration:  Fair  Recall:  Good  Fund of Knowledge:Good  Language: Good  Akathisia:  No  Handed:  Left  AIMS (if indicated):    Assets:  Communication Skills Desire for Improvement Heritage manager  ADL's:  Intact  Cognition: WNL  Sleep:  poor    Allergies:   Allergies  Allergen Reactions   Erythromycin Other (See Comments)    arrhythmia   Zocor  [Simvastatin-High Dose] Rash   Doxycycline Other (See Comments)    Decreased BP and caused dizziness per patient   Peroxide [Hydrogen Peroxide]    Current Medications: Current Outpatient Medications  Medication Sig Dispense Refill   atorvastatin (LIPITOR) 10 MG tablet TAKE 1 TABLET(10 MG) BY MOUTH DAILY 90 tablet 1   buPROPion (WELLBUTRIN XL) 300 MG 24 hr tablet TAKE 1 TABLET EVERY DAY 90 tablet 0   busPIRone (BUSPAR) 10 MG tablet TAKE 1 TABLET THREE TIMES DAILY 270 tablet 0   Calcium-Magnesium-Vitamin D (CALCIUM MAGNESIUM PO) Take 1 tablet by mouth daily.     cetirizine (ZYRTEC) 10 MG tablet Take 10 mg by mouth daily.      Cholecalciferol (VITAMIN D) 2000 units CAPS Take 1 capsule by mouth daily.     clotrimazole-betamethasone (LOTRISONE) cream Apply 1 application topically 2 (two) times daily. 30 g 0   cyclobenzaprine (FLEXERIL) 5 MG tablet   1   estradiol (CLIMARA) 0.05 mg/24hr patch Place 1 patch (0.05 mg total) onto the skin once a week. Twice weekly 24 patch  0   fluconazole (DIFLUCAN) 150 MG tablet 1 po x1, may repeat in 3 days prn (Patient not taking: Reported on 04/28/2019) 2 tablet 0   gabapentin (NEURONTIN) 300 MG capsule Take 3 capsules by mouth at bedtime. Takes 1AM + 4HS     glucose blood (ONE TOUCH ULTRA TEST) test strip Test blood sugar once daily. Dx code: 250.00 100 each 12   L-Glutamine 500 MG TABS Take 1 tablet by mouth daily.     Lactobacillus (ACIDOPHILUS PO) Take 1 capsule by mouth daily. 2 Billion active cultures     lamoTRIgine (LAMICTAL) 100 MG tablet TAKE 1 TABLET DAILY IN ADDITION TO '200MG'$  TABLET DAILY 90 tablet 0   lamoTRIgine (LAMICTAL) 200 MG tablet TAKE 1 TABLET AT BEDTIME 90 tablet 0   leflunomide (ARAVA) 10 MG tablet Take by mouth.     levofloxacin (LEVAQUIN) 500 MG tablet Take 1 tablet (500 mg total) by mouth daily. (Patient not taking: Reported on 04/28/2019) 7 tablet 0   levonorgestrel (MIRENA) 20 MCG/24HR IUD 1 each by Intrauterine route once.     levothyroxine (SYNTHROID) 50 MCG tablet Take 1 tablet (50 mcg total) by mouth daily before breakfast. 90 tablet 1   lisinopril (ZESTRIL) 5 MG tablet Take 1 tablet (5 mg total) by mouth daily. 90 tablet 1   metFORMIN (GLUCOPHAGE-XR) 500 MG 24 hr tablet TAKE 1 TABLET(500 MG) BY MOUTH EVERY EVENING 90 tablet 1   Multiple Vitamin (MULTIVITAMIN WITH MINERALS) TABS Take 1 tablet by mouth daily.     mupirocin ointment (BACTROBAN) 2 % APPLY TO LESION ON LEFT BREAST TWICE DAILY FOR 14 DAYS  0   naproxen sodium (ANAPROX) 220 MG tablet Take 220 mg by mouth.     ONETOUCH DELICA LANCETS 60Y MISC Test blood sugar once daily. Dx code: 250.00 100 each 12   OVER THE COUNTER MEDICATION Vitamin B-12 5000 mcg 1 tablet daily.     OVER THE COUNTER MEDICATION Amberen 2 tablets every morning     oxyCODONE 10 MG TABS Take 1 tablet (10 mg total) by mouth every 6 (six) hours as needed (for pain). 60 tablet 0   Phenylephrine-Acetaminophen (TYLENOL SINUS CONGESTION/PAIN PO) Take 2 tablets by mouth daily as  needed (for sinus congestion).      Sarilumab (KEVZARA) 200 MG/1.14ML SOAJ Inject 200 mg into the skin. Every 2 weeks     terconazole (TERAZOL 7) 0.4 % vaginal cream Place 1 applicator vaginally at bedtime. Use for seven days (Patient not taking: Reported on 04/28/2019) 45 g 0   tretinoin (RETIN-A) 0.025 % gel Apply 3 nights weekly x 2 weeks can increase to nightly only as tolerated     No current facility-administered medications for this visit.     Treatment Plan Summary: Medication management  Prior documentation reviewed    1. Depression: dad terminal , had closure talking to him, depression fair with meds, discussed distraction and continue meds, no rash on lamictal and wellbutrin 2. Anxiety ; fluctuates, continue buspar 3. Insomnia: fluctuates, continue work on sleep hygiene  4. Back pain:chronic.  Working with providers following with [facility] for pain and RA   Fu 85m  NMerian Capron MD  1:48 PM 12/14/2021

## 2021-12-28 ENCOUNTER — Other Ambulatory Visit (HOSPITAL_COMMUNITY): Payer: Self-pay | Admitting: Psychiatry

## 2022-02-13 ENCOUNTER — Other Ambulatory Visit (HOSPITAL_COMMUNITY): Payer: Self-pay | Admitting: Psychiatry

## 2022-03-22 ENCOUNTER — Telehealth (INDEPENDENT_AMBULATORY_CARE_PROVIDER_SITE_OTHER): Payer: Medicare PPO | Admitting: Psychiatry

## 2022-03-22 ENCOUNTER — Encounter (HOSPITAL_COMMUNITY): Payer: Self-pay | Admitting: Psychiatry

## 2022-03-22 DIAGNOSIS — F331 Major depressive disorder, recurrent, moderate: Secondary | ICD-10-CM | POA: Diagnosis not present

## 2022-03-22 DIAGNOSIS — Z638 Other specified problems related to primary support group: Secondary | ICD-10-CM

## 2022-03-22 DIAGNOSIS — M549 Dorsalgia, unspecified: Secondary | ICD-10-CM

## 2022-03-22 DIAGNOSIS — F411 Generalized anxiety disorder: Secondary | ICD-10-CM | POA: Diagnosis not present

## 2022-03-22 DIAGNOSIS — G8929 Other chronic pain: Secondary | ICD-10-CM

## 2022-03-22 NOTE — Progress Notes (Signed)
Patient ID: Michelle Mueller, female   DOB: 10/22/1965, 57 y.o.   MRN: [id number]  Psychiatric Outpatient Follow up Visit  Patient Identification: Michelle Mueller MRN:  [id number] Date of Evaluation:  03/22/2022 Referral Source: [name] Chief Complaint:    depression . Follow up Visit Diagnosis: depression, anxiety Patient Active Problem List   Diagnosis Date Noted   Influenza [J11.1] 02/28/2018   Pansinusitis [J32.4] 02/28/2018   Rash [R21] 01/13/2018   Hypothyroidism [E03.9] 01/13/2018   Hyperlipidemia associated with type 2 diabetes mellitus ([place]) [E11.69, E78.5] 07/30/2016   Rheumatoid arthritis of multiple sites with negative rheumatoid factor ([place]) [M06.09] 03/13/2016   Chest pain of uncertain etiology [id number] 12/21/2014   Essential hypertension [I10] 12/21/2014   Myalgia [M79.10] 11/10/2014   Insomnia [G47.00] 11/10/2014   UTI (urinary tract infection) [N39.0] 08/04/2013   Fibromyalgia muscle pain [M79.7] 06/13/2013   Severe depression ([place]) [F32.2] 06/13/2013   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, without long-term current use of insulin ([place]) [E11.620] 06/13/2013   Rheumatoid arthritis ([place]) [M06.9] 03/19/2013   Fibromyalgia [M79.7] 03/19/2013   Unspecified hypothyroidism [E03.9] 03/19/2013   Goiter [E04.9] 03/19/2013   Obesity (BMI 30-39.9) [E66.9] 03/19/2013   Pseudoarthrosis of lumbar spine L4-L5 [S32.009K] 06/20/2012   Family history of pancreatic cancer [Z80.0] 01/18/2011   Chronic diarrhea [K52.9] 01/18/2011   Virtual Visit via Video Note  I connected with Michelle Mueller on 03/22/22 at  1:30 PM EST by a video enabled telemedicine application and verified that I am speaking with the correct person using two identifiers.  Location: Patient: home Provider: home office   I discussed the limitations of evaluation and management by telemedicine and the availability of in person appointments. The patient expressed understanding and agreed  to proceed.      I discussed the assessment and treatment plan with the patient. The patient was provided an opportunity to ask questions and all were answered. The patient agreed with the plan and demonstrated an understanding of the instructions.   The patient was advised to call back or seek an in-person evaluation if the symptoms worsen or if the condition fails to improve as anticipated.  I provided 15 minutes of non-face-to-face time during this encounter.    HPI:  Patient dad died 3 months ago, she saw him one night before on his request so he has closure as they abondoned her 9 years ago after her RA diagnosis  She feels guilt as now she is going thru depression again, mom wants her to be more involved but there has been abondonement concerns and she has lost trust in family except her sons  Feels amtoviated, mostly stays at home but does feel it will pass and its situational   Pain can also effect mood, She has RA and follows with providers   Modifying factor:friends,sons Duration : adult life Severity : effected by RA flare ups, recent death of dad and re engagement with family   Past Medical History:  Past Medical History:  Diagnosis Date   Anxiety    Arthritis    Asthma    last attack 3 yrs ago   Chicken pox    Depression    Diabetes mellitus without complication ([place])    Fibromyalgia    Goiter    Hypertension    [name]   Hypothyroidism    Ovarian cyst    PONV (postoperative nausea and vomiting)     Past Surgical History:  Procedure Laterality Date  ABDOMINAL EXPOSURE  12/18/2011   Procedure: ABDOMINAL EXPOSURE;  Surgeon: [name], MD;  Location: [facility];  Service: Vascular;  Laterality: N/A;  Anterior Expossure for anterior lumbar interbody fuson   ANTERIOR LUMBAR FUSION  12/18/2011   Procedure: ANTERIOR LUMBAR FUSION 1 LEVEL;  Surgeon: [name], MD;  Location: [place] [facility];  Service: Neurosurgery;  Laterality: N/A;  Lumbar four-five  Anterior Lumbar Interbody Fusion with Dr. [name] to do anterior exposure   [place]     L breast   TONSILLECTOMY     TUMOR EXCISION     Nerve sheath tumor, R arm   Family History:  Family History  Problem Relation Age of Onset   Heart disease Father    Lung cancer Father    Colon cancer Maternal Aunt    Pancreatic cancer Maternal Grandmother    Pancreatic cancer Maternal Grandfather    Pancreatic cancer Paternal Grandmother    Cancer Paternal Grandfather    Sexual abuse Neg Hx    Social History:   Social History   Socioeconomic History   Marital status: Divorced    Spouse name: Not on file   Number of children: Not on file   Years of education: Not on file   Highest education level: Not on file  Occupational History   Not on file  Tobacco Use   Smoking status: Never   Smokeless tobacco: Never  Vaping Use   Vaping Use: Never used  Substance and Sexual Activity   Alcohol use: No    Alcohol/week: 0.0 standard drinks of alcohol   Drug use: No   Sexual activity: Not Currently    Birth control/protection: I.U.D., None  Other Topics Concern   Not on file  Social History Narrative   Not on file   Social Determinants of Health   Financial Resource Strain: Not on file  Food Insecurity: Not on file  Transportation Needs: Not on file  Physical Activity: Not on file  Stress: Not on file  Social Connections: Not on file      Psychiatric Specialty Exam:   Depression        Associated symptoms include myalgias.  Associated symptoms include no suicidal ideas.   Review of Systems  Cardiovascular:  Negative for chest pain and palpitations.  Musculoskeletal:  Positive for myalgias.  Psychiatric/Behavioral:  Positive for depression. Negative for substance abuse and suicidal ideas.     There were no vitals taken for this visit.There is no height or weight on file to calculate BMI.  General Appearance:  fair  Eye Contact:  Good  Speech:  Clear and Coherent  Volume:  Normal  Mood: subdued  Affect: congruent  Thought Process:  Coherent, Goal Directed, Intact, Linear and Logical  Orientation:  Full (Time, Place, and Person)  Thought Content:  WDL  Suicidal Thoughts:  No  Homicidal Thoughts:  No  Memory:  Immediate;   Good Recent;   Good Remote;   Good  Judgement:  Good  Insight:  Present  Psychomotor Activity:  Decreased  Concentration:  Fair  Recall:  Good  Fund of Knowledge:Good  Language: Good  Akathisia:  No  Handed:  Left  AIMS (if indicated):    Assets:  Communication Skills Desire for Improvement Financial Resources/Insurance Housing Resilience Transportation  ADL's:  Intact  Cognition: WNL  Sleep:  poor    Allergies:   Allergies  Allergen Reactions   Erythromycin Other (See Comments)    arrhythmia   Zocor  [Simvastatin-High Dose] Rash   Doxycycline Other (See Comments)    Decreased BP and caused dizziness per patient   Peroxide [Hydrogen Peroxide]    Current Medications: Current Outpatient Medications  Medication Sig Dispense Refill   atorvastatin (LIPITOR) 10 MG tablet TAKE 1 TABLET(10 MG) BY MOUTH DAILY 90 tablet 1   buPROPion (WELLBUTRIN XL) 300 MG 24 hr tablet TAKE 1 TABLET EVERY DAY 90 tablet 0   busPIRone (BUSPAR) 10 MG tablet TAKE 1 TABLET THREE TIMES DAILY 270 tablet 3   Calcium-Magnesium-Vitamin D (CALCIUM MAGNESIUM PO) Take 1 tablet by mouth daily.     cetirizine (ZYRTEC) 10 MG tablet Take 10 mg by mouth daily.      Cholecalciferol (VITAMIN D) 2000 units CAPS Take 1 capsule by mouth daily.     clotrimazole-betamethasone (LOTRISONE) cream Apply 1 application topically 2 (two) times daily. 30 g 0   cyclobenzaprine (FLEXERIL) 5 MG tablet   1   estradiol (CLIMARA) 0.05 mg/24hr patch Place 1 patch (0.05 mg total) onto the skin once a week. Twice weekly 24 patch 0   fluconazole (DIFLUCAN) 150 MG tablet 1 po x1, may repeat in 3 days prn (Patient not  taking: Reported on 04/28/2019) 2 tablet 0   gabapentin (NEURONTIN) 300 MG capsule Take 3 capsules by mouth at bedtime. Takes 1AM + 4HS     glucose blood (ONE TOUCH ULTRA TEST) test strip Test blood sugar once daily. Dx code: 250.00 100 each 12   L-Glutamine 500 MG TABS Take 1 tablet by mouth daily.     Lactobacillus (ACIDOPHILUS PO) Take 1 capsule by mouth daily. 2 Billion active cultures     lamoTRIgine (LAMICTAL) 100 MG tablet TAKE 1 TABLET DAILY IN ADDITION TO 200MG TABLET DAILY 90 tablet 3   lamoTRIgine (LAMICTAL) 200 MG tablet TAKE 1 TABLET AT BEDTIME 90 tablet 3   leflunomide (ARAVA) 10 MG tablet Take by mouth.     levofloxacin (LEVAQUIN) 500 MG tablet Take 1 tablet (500 mg total) by mouth daily. (Patient not taking: Reported on 04/28/2019) 7 tablet 0   levonorgestrel (MIRENA) 20 MCG/24HR IUD 1 each by Intrauterine route once.     levothyroxine (SYNTHROID) 50 MCG tablet Take 1 tablet (50 mcg total) by mouth daily before breakfast. 90 tablet 1   lisinopril (ZESTRIL) 5 MG tablet Take 1 tablet (5 mg total) by mouth daily. 90 tablet 1   metFORMIN (GLUCOPHAGE-XR) 500 MG 24 hr tablet TAKE 1 TABLET(500 MG) BY MOUTH EVERY EVENING 90 tablet 1   Multiple Vitamin (MULTIVITAMIN WITH MINERALS) TABS Take 1 tablet by mouth daily.     mupirocin ointment (BACTROBAN) 2 % APPLY TO LESION ON LEFT BREAST TWICE DAILY FOR 14 DAYS  0   naproxen sodium (ANAPROX) 220 MG tablet Take 220 mg by mouth.     ONETOUCH DELICA LANCETS 99991111 MISC Test blood sugar once daily. Dx code: 250.00 100 each 12   OVER THE COUNTER MEDICATION Vitamin B-12 5000 mcg 1 tablet daily.     OVER THE COUNTER MEDICATION Amberen 2 tablets every morning     oxyCODONE 10 MG TABS Take 1 tablet (10 mg total) by mouth every 6 (six) hours as needed (for pain). 60 tablet 0   Phenylephrine-Acetaminophen (TYLENOL SINUS CONGESTION/PAIN PO) Take 2 tablets by mouth daily as needed (for sinus congestion).      Sarilumab (KEVZARA) 200 MG/1.14ML SOAJ Inject 200  mg  into the skin. Every 2 weeks     terconazole (TERAZOL 7) 0.4 % vaginal cream Place 1 applicator vaginally at bedtime. Use for seven days (Patient not taking: Reported on 04/28/2019) 45 g 0   tretinoin (RETIN-A) 0.025 % gel Apply 3 nights weekly x 2 weeks can increase to nightly only as tolerated     No current facility-administered medications for this visit.     Treatment Plan Summary: Medication management  Prior documentation reviewed  1. Depression:dad died, had closure with him but now has family concerns again, she knows it will pass and does not want to adjust meds, continue distraction and add activities, continue wellbutrin Consider therapy but says cannot afford , not suicidal and has sons support  2. Anxiety ;fluctuates continue buspar 3. Insomnia: irregular sleep, discussed to regularize hours and work on sleep hygiene  4. Back pain:chronic.  Working with providers following with [facility] for pain and RA   Fu 64m  NMerian Capron MD  1:35 PM 03/22/2022

## 2022-04-03 ENCOUNTER — Other Ambulatory Visit (HOSPITAL_COMMUNITY): Payer: Self-pay | Admitting: Psychiatry

## 2022-04-05 ENCOUNTER — Telehealth (HOSPITAL_COMMUNITY): Payer: Self-pay | Admitting: Psychiatry

## 2022-04-05 MED ORDER — BUPROPION HCL ER (XL) 300 MG PO TB24: ORAL_TABLET | ORAL | 0 refills | Status: DC

## 2022-04-05 NOTE — Telephone Encounter (Signed)
Patient called requesting refill of: buPROPion (WELLBUTRIN XL) 300 MG 24 hr tablet    [place], [place] (Ph: [phone])   Last ordered: 02/16/2021 - 90 tablets  Last visit: 03/22/2022  Next visit: 06/28/2022

## 2022-06-28 ENCOUNTER — Telehealth (HOSPITAL_COMMUNITY): Payer: Medicare PPO | Admitting: Psychiatry

## 2022-07-03 ENCOUNTER — Telehealth (HOSPITAL_COMMUNITY): Payer: Self-pay | Admitting: *Deleted

## 2022-07-03 ENCOUNTER — Encounter (HOSPITAL_COMMUNITY): Payer: Self-pay | Admitting: Psychiatry

## 2022-07-03 ENCOUNTER — Telehealth (INDEPENDENT_AMBULATORY_CARE_PROVIDER_SITE_OTHER): Payer: Medicare PPO | Admitting: Psychiatry

## 2022-07-03 DIAGNOSIS — F411 Generalized anxiety disorder: Secondary | ICD-10-CM

## 2022-07-03 DIAGNOSIS — G8929 Other chronic pain: Secondary | ICD-10-CM

## 2022-07-03 DIAGNOSIS — Z638 Other specified problems related to primary support group: Secondary | ICD-10-CM

## 2022-07-03 DIAGNOSIS — F331 Major depressive disorder, recurrent, moderate: Secondary | ICD-10-CM | POA: Diagnosis not present

## 2022-07-03 MED ORDER — BUPROPION HCL ER (XL) 300 MG PO TB24: ORAL_TABLET | ORAL | 0 refills | Status: DC

## 2022-07-03 NOTE — Telephone Encounter (Signed)
Patient called stated that her buPROPion (WELLBUTRIN XL) 300 MG 24 hr tablet  SENT TO INCORRECT PHARMACY. Pharmacy [facility] [phone] - [place], [place] - [address] AT [facility] OF [address]   PREFERRED PHARMACY LIST HAS BEEN CLEANED & NEW Rx NEEDS TO GO Rx IN [facility]

## 2022-07-03 NOTE — Progress Notes (Signed)
Patient ID: Michelle Mueller, female   DOB: 12-31-1965, 57 y.o.   MRN: [id number]  Psychiatric Outpatient Follow up Visit  Patient Identification: Michelle Mueller MRN:  [id number] Date of Evaluation:  07/03/2022 Referral Source: Dr. Deneise Lever Chief Complaint:    depression . Follow up Visit Diagnosis: depression, anxiety Patient Active Problem List   Diagnosis Date Noted   Influenza [J11.1] 02/28/2018   Pansinusitis [J32.4] 02/28/2018   Rash [R21] 01/13/2018   Hypothyroidism [E03.9] 01/13/2018   Hyperlipidemia associated with type 2 diabetes mellitus ([facility]) [E11.69, E78.5] 07/30/2016   Rheumatoid arthritis of multiple sites with negative rheumatoid factor ([facility]) [M06.09] 03/13/2016   Chest pain of uncertain etiology [R07.9] 12/21/2014   Essential hypertension [I10] 12/21/2014   Myalgia [M79.10] 11/10/2014   Insomnia [G47.00] 11/10/2014   UTI (urinary tract infection) [N39.0] 08/04/2013   Fibromyalgia muscle pain [M79.7] 06/13/2013   Severe depression ([facility]) [F32.2] 06/13/2013   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, without long-term current use of insulin ([facility]) [E11.620] 06/13/2013   Rheumatoid arthritis ([facility]) [M06.9] 03/19/2013   Fibromyalgia [M79.7] 03/19/2013   Unspecified hypothyroidism [E03.9] 03/19/2013   Goiter [E04.9] 03/19/2013   Obesity (BMI 30-39.9) [E66.9] 03/19/2013   Pseudoarthrosis of lumbar spine L4-L5 [S32.009K] 06/20/2012   Family history of pancreatic cancer [Z80.0] 01/18/2011   Chronic diarrhea [K52.9] 01/18/2011   Virtual Visit via Video Note  I connected with Michelle Mueller on 07/03/22 at 12:30 PM EDT by a video enabled telemedicine application and verified that I am speaking with the correct person using two identifiers.  Location: Patient: home  Provider: home office    I discussed the limitations of evaluation and management by telemedicine and the availability of in person appointments. The patient expressed understanding and agreed  to proceed.     I discussed the assessment and treatment plan with the patient. The patient was provided an opportunity to ask questions and all were answered. The patient agreed with the plan and demonstrated an understanding of the instructions.   The patient was advised to call back or seek an in-person evaluation if the symptoms worsen or if the condition fails to improve as anticipated.  I provided  minutes of non-face-to-face time during this encounter.    HPI:  Patient dad died 5 months ago, she saw him one night before on his request so he has closure as they abondoned her 9 years ago after her RA diagnosis  Last visit was going thru guilt , depression and her mom wanted to keep connection but it was upsetting her as they have left her at time of crises.  Some motivation is back, activity is back, reading books   Pain can also effect mood, She has RA and follows with providers   Modifying factor:friends,sons Duration : adult life Severity : effected by RA flare ups, adjusting to it  Past Medical History:  Past Medical History:  Diagnosis Date   Anxiety    Arthritis    Asthma    last attack 3 yrs ago   Chicken pox    Depression    Diabetes mellitus without complication ([facility])    Fibromyalgia    Goiter    Hypertension    dr Talmage Nap   Hypothyroidism    Ovarian cyst    PONV (postoperative nausea and vomiting)     Past Surgical History:  Procedure Laterality Date   ABDOMINAL EXPOSURE  12/18/2011   Procedure: ABDOMINAL EXPOSURE;  Surgeon: Larina Earthly, MD;  Location:  [facility];  Service: Vascular;  Laterality: N/A;  Anterior Expossure for anterior lumbar interbody fuson   ANTERIOR LUMBAR FUSION  12/18/2011   Procedure: ANTERIOR LUMBAR FUSION 1 LEVEL;  Surgeon: Barnett Abu, MD;  Location: [facility];  Service: Neurosurgery;  Laterality: N/A;  Lumbar four-five Anterior Lumbar Interbody Fusion with Dr. Arbie Cookey to do anterior exposure   CESAREAN SECTION      CHOLECYSTECTOMY     HERNIA REPAIR     LIPOMA EXCISION     L breast   TONSILLECTOMY     TUMOR EXCISION     Nerve sheath tumor, R arm   Family History:  Family History  Problem Relation Age of Onset   Heart disease Father    Lung cancer Father    Colon cancer Maternal Aunt    Pancreatic cancer Maternal Grandmother    Pancreatic cancer Maternal Grandfather    Pancreatic cancer Paternal Grandmother    Cancer Paternal Grandfather    Sexual abuse Neg Hx    Social History:   Social History   Socioeconomic History   Marital status: Divorced    Spouse name: Not on file   Number of children: Not on file   Years of education: Not on file   Highest education level: Not on file  Occupational History   Not on file  Tobacco Use   Smoking status: Never   Smokeless tobacco: Never  Vaping Use   Vaping Use: Never used  Substance and Sexual Activity   Alcohol use: No    Alcohol/week: 0.0 standard drinks of alcohol   Drug use: No   Sexual activity: Not Currently    Birth control/protection: I.U.D., None  Other Topics Concern   Not on file  Social History Narrative   Not on file   Social Determinants of Health   Financial Resource Strain: Not on file  Food Insecurity: Not on file  Transportation Needs: Not on file  Physical Activity: Not on file  Stress: Not on file  Social Connections: Not on file      Psychiatric Specialty Exam:   Depression        Associated symptoms include myalgias.  Associated symptoms include no suicidal ideas.   Review of Systems  Cardiovascular:  Negative for chest pain and palpitations.  Musculoskeletal:  Positive for myalgias.  Psychiatric/Behavioral:  Positive for depression. Negative for substance abuse and suicidal ideas.     There were no vitals taken for this visit.There is no height or weight on file to calculate BMI.  General Appearance: fair  Eye Contact:  Good  Speech:  Clear and Coherent  Volume:  Normal  Mood: fair  Affect:  congruent  Thought Process:  Coherent, Goal Directed, Intact, Linear and Logical  Orientation:  Full (Time, Place, and Person)  Thought Content:  WDL  Suicidal Thoughts:  No  Homicidal Thoughts:  No  Memory:  Immediate;   Good Recent;   Good Remote;   Good  Judgement:  Good  Insight:  Present  Psychomotor Activity:  Decreased  Concentration:  Fair  Recall:  Good  Fund of Knowledge:Good  Language: Good  Akathisia:  No  Handed:  Left  AIMS (if indicated):    Assets:  Communication Skills Desire for Improvement Financial Resources/Insurance Housing Resilience Transportation  ADL's:  Intact  Cognition: WNL  Sleep:  poor    Allergies:   Allergies  Allergen Reactions   Erythromycin Other (See Comments)    arrhythmia   Zocor  [  Simvastatin-High Dose] Rash   Doxycycline Other (See Comments)    Decreased BP and caused dizziness per patient   Peroxide [Hydrogen Peroxide]    Current Medications: Current Outpatient Medications  Medication Sig Dispense Refill   atorvastatin (LIPITOR) 10 MG tablet TAKE 1 TABLET(10 MG) BY MOUTH DAILY 90 tablet 1   buPROPion (WELLBUTRIN XL) 300 MG 24 hr tablet TAKE 1 TABLET EVERY DAY 90 tablet 0   busPIRone (BUSPAR) 10 MG tablet TAKE 1 TABLET THREE TIMES DAILY 270 tablet 3   Calcium-Magnesium-Vitamin D (CALCIUM MAGNESIUM PO) Take 1 tablet by mouth daily.     cetirizine (ZYRTEC) 10 MG tablet Take 10 mg by mouth daily.      Cholecalciferol (VITAMIN D) 2000 units CAPS Take 1 capsule by mouth daily.     clotrimazole-betamethasone (LOTRISONE) cream Apply 1 application topically 2 (two) times daily. 30 g 0   cyclobenzaprine (FLEXERIL) 5 MG tablet   1   estradiol (CLIMARA) 0.05 mg/24hr patch Place 1 patch (0.05 mg total) onto the skin once a week. Twice weekly 24 patch 0   fluconazole (DIFLUCAN) 150 MG tablet 1 po x1, may repeat in 3 days prn (Patient not taking: Reported on 04/28/2019) 2 tablet 0   gabapentin (NEURONTIN) 300 MG capsule Take 3 capsules by  mouth at bedtime. Takes 1AM + 4HS     glucose blood (ONE TOUCH ULTRA TEST) test strip Test blood sugar once daily. Dx code: 250.00 100 each 12   L-Glutamine 500 MG TABS Take 1 tablet by mouth daily.     Lactobacillus (ACIDOPHILUS PO) Take 1 capsule by mouth daily. 2 Billion active cultures     lamoTRIgine (LAMICTAL) 100 MG tablet TAKE 1 TABLET DAILY IN ADDITION TO 200MG  TABLET DAILY 90 tablet 3   lamoTRIgine (LAMICTAL) 200 MG tablet TAKE 1 TABLET AT BEDTIME 90 tablet 3   leflunomide (ARAVA) 10 MG tablet Take by mouth.     levofloxacin (LEVAQUIN) 500 MG tablet Take 1 tablet (500 mg total) by mouth daily. (Patient not taking: Reported on 04/28/2019) 7 tablet 0   levonorgestrel (MIRENA) 20 MCG/24HR IUD 1 each by Intrauterine route once.     levothyroxine (SYNTHROID) 50 MCG tablet Take 1 tablet (50 mcg total) by mouth daily before breakfast. 90 tablet 1   lisinopril (ZESTRIL) 5 MG tablet Take 1 tablet (5 mg total) by mouth daily. 90 tablet 1   metFORMIN (GLUCOPHAGE-XR) 500 MG 24 hr tablet TAKE 1 TABLET(500 MG) BY MOUTH EVERY EVENING 90 tablet 1   Multiple Vitamin (MULTIVITAMIN WITH MINERALS) TABS Take 1 tablet by mouth daily.     mupirocin ointment (BACTROBAN) 2 % APPLY TO LESION ON LEFT BREAST TWICE DAILY FOR 14 DAYS  0   naproxen sodium (ANAPROX) 220 MG tablet Take 220 mg by mouth.     ONETOUCH DELICA LANCETS 33G MISC Test blood sugar once daily. Dx code: 250.00 100 each 12   OVER THE COUNTER MEDICATION Vitamin B-12 5000 mcg 1 tablet daily.     OVER THE COUNTER MEDICATION Amberen 2 tablets every morning     oxyCODONE 10 MG TABS Take 1 tablet (10 mg total) by mouth every 6 (six) hours as needed (for pain). 60 tablet 0   Phenylephrine-Acetaminophen (TYLENOL SINUS CONGESTION/PAIN PO) Take 2 tablets by mouth daily as needed (for sinus congestion).      Sarilumab (KEVZARA) 200 MG/1. SOAJ Inject 200 mg into the skin. Every 2 weeks     terconazole (TERAZOL 7) 0.4 % vaginal cream  Place 1 applicator  vaginally at bedtime. Use for seven days (Patient not taking: Reported on 04/28/2019) 45 g 0   tretinoin (RETIN-A) 0.025 % gel Apply 3 nights weekly x 2 weeks can increase to nightly only as tolerated     No current facility-administered medications for this visit.     Treatment Plan Summary: Medication management  Prior documentation reviewed   1. Depression:dad died, mom wanted her to be back with connection, she feels she was abodnoned last time so moving away and keeping in control. Sons are helpful  depression is manageable continue lamictal. No rash.  Continue wellbutrin 2. Anxiety ;fluctuates, continue  treatment, listing to positive notes and therapy if possible 3. Insomnia: did not endorse continue sleep hgyiene, pain can effect mood and sleep  4. Back pain:chronic.  Working with providers following with [facility] for pain and RA   Fu 3-54m.  Thresa Ross, MD  12:38 PM 07/03/2022

## 2022-07-03 NOTE — Addendum Note (Signed)
Addended by: Thresa Ross on: 07/03/2022 02:17 PM   Modules accepted: Orders

## 2022-11-06 ENCOUNTER — Telehealth (INDEPENDENT_AMBULATORY_CARE_PROVIDER_SITE_OTHER): Payer: Medicare PPO | Admitting: Psychiatry

## 2022-11-06 ENCOUNTER — Encounter (HOSPITAL_COMMUNITY): Payer: Self-pay | Admitting: Psychiatry

## 2022-11-06 DIAGNOSIS — Z638 Other specified problems related to primary support group: Secondary | ICD-10-CM

## 2022-11-06 DIAGNOSIS — F411 Generalized anxiety disorder: Secondary | ICD-10-CM | POA: Diagnosis not present

## 2022-11-06 DIAGNOSIS — F331 Major depressive disorder, recurrent, moderate: Secondary | ICD-10-CM

## 2022-11-06 MED ORDER — BUSPIRONE HCL 10 MG PO TABS: 10.0000 mg | ORAL_TABLET | Freq: Two times a day (BID) | ORAL | 0 refills | Status: DC

## 2022-11-06 MED ORDER — BUPROPION HCL ER (XL) 300 MG PO TB24: ORAL_TABLET | ORAL | 0 refills | Status: DC

## 2022-11-06 NOTE — Progress Notes (Signed)
Patient ID: Michelle Mueller, female   DOB: 02/02/1965, 57 y.o.   MRN: [id number]  Psychiatric Outpatient Follow up Visit  Patient Identification: PAYSLEY POPLAR MRN:  [id number] Date of Evaluation:  11/06/2022 Referral Source: Dr. Deneise Lever Chief Complaint:    depression . Follow up Visit Diagnosis: depression, anxiety Patient Active Problem List   Diagnosis Date Noted   Influenza [J11.1] 02/28/2018   Pansinusitis [J32.4] 02/28/2018   Rash [R21] 01/13/2018   Hypothyroidism [E03.9] 01/13/2018   Hyperlipidemia associated with type 2 diabetes mellitus (HCC) [E11.69, E78.5] 07/30/2016   Rheumatoid arthritis of multiple sites with negative rheumatoid factor (HCC) [M06.09] 03/13/2016   Chest pain of uncertain etiology [R07.9] 12/21/2014   Essential hypertension [I10] 12/21/2014   Myalgia [M79.10] 11/10/2014   Insomnia [G47.00] 11/10/2014   UTI (urinary tract infection) [N39.0] 08/04/2013   Fibromyalgia muscle pain [M79.7] 06/13/2013   Severe depression (HCC) [F32.2] 06/13/2013   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, without long-term current use of insulin (HCC) [E11.620] 06/13/2013   Rheumatoid arthritis (HCC) [M06.9] 03/19/2013   Fibromyalgia [M79.7] 03/19/2013   Hypothyroidism [E03.9] 03/19/2013   Goiter [E04.9] 03/19/2013   Obesity (BMI 30-39.9) [E66.9] 03/19/2013   Pseudoarthrosis of lumbar spine L4-L5 [S32.009K] 06/20/2012   Family history of pancreatic cancer [Z80.0] 01/18/2011   Chronic diarrhea [K52.9] 01/18/2011   Virtual Visit via Video Note  I connected with Michelle Mueller on 11/06/22 at  1:30 PM EDT by a video enabled telemedicine application and verified that I am speaking with the correct person using two identifiers.  Location: Patient: home Provider: home office   I discussed the limitations of evaluation and management by telemedicine and the availability of in person appointments. The patient expressed understanding and agreed to  proceed.      I discussed the assessment and treatment plan with the patient. The patient was provided an opportunity to ask questions and all were answered. The patient agreed with the plan and demonstrated an understanding of the instructions.   The patient was advised to call back or seek an in-person evaluation if the symptoms worsen or if the condition fails to improve as anticipated.  I provided 20 minutes of non-face-to-face time during this encounter.      HPI:  Patient dad died 8 months ago, she saw him one night before on his request so he has closure as they abondoned her 9 years ago after her RA diagnosis  Last visit was having guilt with family trying to keep her involved . She finally decided to let go as she was still not truly involved by family This has given relief and she is moving on Has sons whom help her out   Motivation is better  Pain can also effect mood, She has RA and follows with providers   Modifying factor:friends,sons Duration : adult life Severity : effected by RA flare ups, adjusting to it , some better  Past Medical History:  Past Medical History:  Diagnosis Date   Anxiety    Arthritis    Asthma    last attack 3 yrs ago   Chicken pox    Depression    Diabetes mellitus without complication (HCC)    Fibromyalgia    Goiter    Hypertension    dr Talmage Nap   Hypothyroidism    Ovarian cyst    PONV (postoperative nausea and vomiting)     Past Surgical History:  Procedure Laterality Date   ABDOMINAL EXPOSURE  12/18/2011  Procedure: ABDOMINAL EXPOSURE;  Surgeon: Larina Earthly, MD;  Location: [facility];  Service: Vascular;  Laterality: N/A;  Anterior Expossure for anterior lumbar interbody fuson   ANTERIOR LUMBAR FUSION  12/18/2011   Procedure: ANTERIOR LUMBAR FUSION 1 LEVEL;  Surgeon: Barnett Abu, MD;  Location: [facility];  Service: Neurosurgery;  Laterality: N/A;  Lumbar four-five Anterior Lumbar Interbody Fusion with Dr. Arbie Cookey to do  anterior exposure   CESAREAN SECTION     CHOLECYSTECTOMY     HERNIA REPAIR     LIPOMA EXCISION     L breast   TONSILLECTOMY     TUMOR EXCISION     Nerve sheath tumor, R arm   Family History:  Family History  Problem Relation Age of Onset   Heart disease Father    Lung cancer Father    Colon cancer Maternal Aunt    Pancreatic cancer Maternal Grandmother    Pancreatic cancer Maternal Grandfather    Pancreatic cancer Paternal Grandmother    Cancer Paternal Grandfather    Sexual abuse Neg Hx    Social History:   Social History   Socioeconomic History   Marital status: Divorced    Spouse name: Not on file   Number of children: Not on file   Years of education: Not on file   Highest education level: Not on file  Occupational History   Not on file  Tobacco Use   Smoking status: Never   Smokeless tobacco: Never  Vaping Use   Vaping status: Never Used  Substance and Sexual Activity   Alcohol use: No    Alcohol/week: 0.0 standard drinks of alcohol   Drug use: No   Sexual activity: Not Currently    Birth control/protection: I.U.D., None  Other Topics Concern   Not on file  Social History Narrative   Not on file   Social Determinants of Health   Financial Resource Strain: Patient Declined (08/29/2022)   Received from [facility]   Overall Financial Resource Strain (CARDIA)    Difficulty of Paying Living Expenses: Patient declined  Recent Concern: Financial Resource Strain - High Risk (06/14/2022)   Received from [facility]   Overall Financial Resource Strain (CARDIA)    Difficulty of Paying Living Expenses: Very hard  Food Insecurity: Patient Declined (08/29/2022)   Received from [facility]   Hunger Vital Sign    Worried About Running Out of Food in the Last Year: Patient declined    Ran Out of Food in the Last Year: Patient declined  Recent Concern: Food Insecurity - Food Insecurity Present (06/14/2022)   Received from [facility]   Hunger Vital Sign     Worried About Running Out of Food in the Last Year: Often true    Ran Out of Food in the Last Year: Often true  Transportation Needs: Patient Declined (08/29/2022)   Received from [facility] - Transportation    Lack of Transportation (Medical): Patient declined    Lack of Transportation (Non-Medical): Patient declined  Physical Activity: Unknown (08/29/2022)   Received from [facility]   Exercise Vital Sign    Days of Exercise per Week: Patient declined    Minutes of Exercise per Session: 30 min  Recent Concern: Physical Activity - Insufficiently Active (06/14/2022)   Received from [facility]   Exercise Vital Sign    Days of Exercise per Week: 2 days    Minutes of Exercise per Session: 30 min  Stress: Patient Declined (08/29/2022)  Received from [facility] - Occupational Stress Questionnaire    Feeling of Stress : Patient declined  Recent Concern: Stress - Stress Concern Present (06/14/2022)   Received from [facility] - Occupational Stress Questionnaire    Feeling of Stress : Very much  Social Connections: Socially Isolated (06/14/2022)   Received from [facility]   Social Network    How would you rate your social network (family, work, friends)?: Little participation, lonely and socially isolated      Psychiatric Specialty Exam:   Depression        Associated symptoms include no suicidal ideas.   Review of Systems  Cardiovascular:  Negative for chest pain.  Psychiatric/Behavioral:  Negative for substance abuse and suicidal ideas.     There were no vitals taken for this visit.There is no height or weight on file to calculate BMI.  General Appearance: fair  Eye Contact:  Good  Speech:  Clear and Coherent  Volume:  Normal  Mood: fair  Affect: congruent  Thought Process:  Coherent, Goal Directed, Intact, Linear and Logical  Orientation:  Full (Time,  Place, and Person)  Thought Content:  WDL  Suicidal Thoughts:  No  Homicidal Thoughts:  No  Memory:  Immediate;   Good Recent;   Good Remote;   Good  Judgement:  Good  Insight:  Present  Psychomotor Activity:  Decreased  Concentration:  Fair  Recall:  Good  Fund of Knowledge:Good  Language: Good  Akathisia:  No  Handed:  Left  AIMS (if indicated):    Assets:  Communication Skills Desire for Improvement Financial Resources/Insurance Housing Resilience Transportation  ADL's:  Intact  Cognition: WNL  Sleep:  poor    Allergies:   Allergies  Allergen Reactions   Erythromycin Other (See Comments)    arrhythmia   Zocor  [Simvastatin-High Dose] Rash   Doxycycline Other (See Comments)    Decreased BP and caused dizziness per patient   Peroxide [Hydrogen Peroxide]    Current Medications: Current Outpatient Medications  Medication Sig Dispense Refill   atorvastatin (LIPITOR) 10 MG tablet TAKE 1 TABLET(10 MG) BY MOUTH DAILY 90 tablet 1   buPROPion (WELLBUTRIN XL) 300 MG 24 hr tablet TAKE 1 TABLET EVERY DAY 90 tablet 0   busPIRone (BUSPAR) 10 MG tablet Take 1 tablet (10 mg total) by mouth 2 (two) times daily. 180 tablet 0   Calcium-Magnesium-Vitamin D (CALCIUM MAGNESIUM PO) Take 1 tablet by mouth daily.     cetirizine (ZYRTEC) 10 MG tablet Take 10 mg by mouth daily.      Cholecalciferol (VITAMIN D) 2000 units CAPS Take 1 capsule by mouth daily.     clotrimazole-betamethasone (LOTRISONE) cream Apply 1 application topically 2 (two) times daily. 30 g 0   cyclobenzaprine (FLEXERIL) 5 MG tablet   1   estradiol (CLIMARA) 0.05 mg/24hr patch Place 1 patch (0.05 mg total) onto the skin once a week. Twice weekly 24 patch 0   fluconazole (DIFLUCAN) 150 MG tablet 1 po x1, may repeat in 3 days prn (Patient not taking: Reported on 04/28/2019) 2 tablet 0   gabapentin (NEURONTIN) 300 MG capsule Take 3 capsules by mouth at bedtime. Takes 1AM + 4HS     glucose blood (ONE TOUCH ULTRA TEST) test  strip Test blood sugar once daily. Dx code: 250.00 100 each 12   L-Glutamine 500 MG TABS Take 1 tablet by mouth daily.  Lactobacillus (ACIDOPHILUS PO) Take 1 capsule by mouth daily. 2 Billion active cultures     lamoTRIgine (LAMICTAL) 100 MG tablet TAKE 1 TABLET DAILY IN ADDITION TO 200MG  TABLET DAILY 90 tablet 3   lamoTRIgine (LAMICTAL) 200 MG tablet TAKE 1 TABLET AT BEDTIME 90 tablet 3   leflunomide (ARAVA) 10 MG tablet Take by mouth.     levofloxacin (LEVAQUIN) 500 MG tablet Take 1 tablet (500 mg total) by mouth daily. (Patient not taking: Reported on 04/28/2019) 7 tablet 0   levonorgestrel (MIRENA) 20 MCG/24HR IUD 1 each by Intrauterine route once.     levothyroxine (SYNTHROID) 50 MCG tablet Take 1 tablet (50 mcg total) by mouth daily before breakfast. 90 tablet 1   lisinopril (ZESTRIL) 5 MG tablet Take 1 tablet (5 mg total) by mouth daily. 90 tablet 1   metFORMIN (GLUCOPHAGE-XR) 500 MG 24 hr tablet TAKE 1 TABLET(500 MG) BY MOUTH EVERY EVENING 90 tablet 1   Multiple Vitamin (MULTIVITAMIN WITH MINERALS) TABS Take 1 tablet by mouth daily.     mupirocin ointment (BACTROBAN) 2 % APPLY TO LESION ON LEFT BREAST TWICE DAILY FOR 14 DAYS  0   naproxen sodium (ANAPROX) 220 MG tablet Take 220 mg by mouth.     ONETOUCH DELICA LANCETS 33G MISC Test blood sugar once daily. Dx code: 250.00 100 each 12   OVER THE COUNTER MEDICATION Vitamin B-12 5000 mcg 1 tablet daily.     OVER THE COUNTER MEDICATION Amberen 2 tablets every morning     oxyCODONE 10 MG TABS Take 1 tablet (10 mg total) by mouth every 6 (six) hours as needed (for pain). 60 tablet 0   Phenylephrine-Acetaminophen (TYLENOL SINUS CONGESTION/PAIN PO) Take 2 tablets by mouth daily as needed (for sinus congestion).      Sarilumab (KEVZARA) 200 MG/1. SOAJ Inject 200 mg into the skin. Every 2 weeks     terconazole (TERAZOL 7) 0.4 % vaginal cream Place 1 applicator vaginally at bedtime. Use for seven days (Patient not taking: Reported on 04/28/2019)  45 g 0   tretinoin (RETIN-A) 0.025 % gel Apply 3 nights weekly x 2 weeks can increase to nightly only as tolerated     No current facility-administered medications for this visit.     Treatment Plan Summary: Medication management  Prior documentation reviewed   1. Depression: better continue wellbutrin and lamictal, no rash 2. Anxiety better continue meds and buspar 3. Insomnia: did not endorse, continue sleep hygiene   4. Back pain:chronic.  Working with providers following with [facility] for pain and RA   Fu 3-40m.  Thresa Ross, MD  1:39 PM 11/06/2022

## 2022-12-06 ENCOUNTER — Other Ambulatory Visit (HOSPITAL_COMMUNITY): Payer: Self-pay | Admitting: Psychiatry

## 2023-02-17 ENCOUNTER — Other Ambulatory Visit (HOSPITAL_COMMUNITY): Payer: Self-pay | Admitting: Psychiatry

## 2023-02-28 ENCOUNTER — Other Ambulatory Visit (HOSPITAL_COMMUNITY): Payer: Self-pay | Admitting: Psychiatry

## 2023-03-12 ENCOUNTER — Telehealth (INDEPENDENT_AMBULATORY_CARE_PROVIDER_SITE_OTHER): Payer: Medicare PPO | Admitting: Psychiatry

## 2023-03-12 ENCOUNTER — Encounter (HOSPITAL_COMMUNITY): Payer: Self-pay | Admitting: Psychiatry

## 2023-03-12 DIAGNOSIS — F331 Major depressive disorder, recurrent, moderate: Secondary | ICD-10-CM

## 2023-03-12 DIAGNOSIS — Z638 Other specified problems related to primary support group: Secondary | ICD-10-CM | POA: Diagnosis not present

## 2023-03-12 DIAGNOSIS — F419 Anxiety disorder, unspecified: Secondary | ICD-10-CM

## 2023-03-12 DIAGNOSIS — F411 Generalized anxiety disorder: Secondary | ICD-10-CM

## 2023-03-12 DIAGNOSIS — F32A Depression, unspecified: Secondary | ICD-10-CM

## 2023-03-12 NOTE — Progress Notes (Signed)
 Patient ID: Michelle Mueller, female   DOB: 1965/12/26, 58 y.o.   MRN: [id number]  Psychiatric Outpatient Follow up Visit  Patient Identification: Michelle Mueller MRN:  [id number] Date of Evaluation:  03/12/2023 Referral Source: Dr. Jamesetta Mcbride Chief Complaint:    depression . Follow up Visit Diagnosis: depression, anxiety Patient Active Problem List   Diagnosis Date Noted   Influenza [J11.1] 02/28/2018   Pansinusitis [J32.4] 02/28/2018   Rash [R21] 01/13/2018   Hypothyroidism [E03.9] 01/13/2018   Hyperlipidemia associated with type 2 diabetes mellitus (HCC) [E11.69, E78.5] 07/30/2016   Rheumatoid arthritis of multiple sites with negative rheumatoid factor (HCC) [M06.09] 03/13/2016   Chest pain of uncertain etiology [R07.9] 12/21/2014   Essential hypertension [I10] 12/21/2014   Myalgia [M79.10] 11/10/2014   Insomnia [G47.00] 11/10/2014   UTI (urinary tract infection) [N39.0] 08/04/2013   Fibromyalgia muscle pain [M79.7] 06/13/2013   Severe depression (HCC) [F32.2] 06/13/2013   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, without long-term current use of insulin (HCC) [E11.620] 06/13/2013   Rheumatoid arthritis (HCC) [M06.9] 03/19/2013   Fibromyalgia [M79.7] 03/19/2013   Hypothyroidism [E03.9] 03/19/2013   Goiter [E04.9] 03/19/2013   Obesity (BMI 30-39.9) [E66.9] 03/19/2013   Pseudoarthrosis of lumbar spine L4-L5 [S32.009K] 06/20/2012   Family history of pancreatic cancer [Z80.0] 01/18/2011   Chronic diarrhea [K52.9] 01/18/2011  Virtual Visit via Video Note  I connected with Michelle Mueller on 03/12/23 at  2:30 PM EST by a video enabled telemedicine application and verified that I am speaking with the correct person using two identifiers.  Location: Patient: home Provider: home office   I discussed the limitations of evaluation and management by telemedicine and the availability of in person appointments. The patient expressed understanding and agreed to  proceed.     I discussed the assessment and treatment plan with the patient. The patient was provided an opportunity to ask questions and all were answered. The patient agreed with the plan and demonstrated an understanding of the instructions.   The patient was advised to call back or seek an in-person evaluation if the symptoms worsen or if the condition fails to improve as anticipated.  I provided 18 minutes of non-face-to-face time during this encounter.     HPI:  Patient has been doing better, moving away from mom as feels parents have been toxic when they left her when patient was sick Dealth with dads death but didn't want to get close to mom again  Moving out or away from guilt Taking fax seeds and lowered her wellbutrin  herself Handling depression. No rash on lamictal   Has 5 dogs that keep her busy May wean slowly down on oxycodone  this year    Motivation is better  Pain can also effect mood, She has RA and follows with providers   Modifying factor:friends, sons Duration : adult life Severity : effected by RA flare ups, adjusting to it , some better  Past Medical History:  Past Medical History:  Diagnosis Date   Anxiety    Arthritis    Asthma    last attack 3 yrs ago   Chicken pox    Depression    Diabetes mellitus without complication (HCC)    Fibromyalgia    Goiter    Hypertension    dr Ronelle Coffee   Hypothyroidism    Ovarian cyst    PONV (postoperative nausea and vomiting)     Past Surgical History:  Procedure Laterality Date   ABDOMINAL EXPOSURE  12/18/2011   Procedure:  ABDOMINAL EXPOSURE;  Surgeon: Mayo Speck, MD;  Location: [facility];  Service: Vascular;  Laterality: N/A;  Anterior Expossure for anterior lumbar interbody fuson   ANTERIOR LUMBAR FUSION  12/18/2011   Procedure: ANTERIOR LUMBAR FUSION 1 LEVEL;  Surgeon: Elna Haggis, MD;  Location: [facility];  Service: Neurosurgery;  Laterality: N/A;  Lumbar four-five Anterior Lumbar Interbody  Fusion with Dr. Shirley Douglas to do anterior exposure   CESAREAN SECTION     CHOLECYSTECTOMY     HERNIA REPAIR     LIPOMA EXCISION     L breast   TONSILLECTOMY     TUMOR EXCISION     Nerve sheath tumor, R arm   Family History:  Family History  Problem Relation Age of Onset   Heart disease Father    Lung cancer Father    Colon cancer Maternal Aunt    Pancreatic cancer Maternal Grandmother    Pancreatic cancer Maternal Grandfather    Pancreatic cancer Paternal Grandmother    Cancer Paternal Grandfather    Sexual abuse Neg Hx    Social History:   Social History   Socioeconomic History   Marital status: Divorced    Spouse name: Not on file   Number of children: Not on file   Years of education: Not on file   Highest education level: Not on file  Occupational History   Not on file  Tobacco Use   Smoking status: Never   Smokeless tobacco: Never  Vaping Use   Vaping status: Never Used  Substance and Sexual Activity   Alcohol use: No    Alcohol/week: 0.0 standard drinks of alcohol   Drug use: No   Sexual activity: Not Currently    Birth control/protection: I.U.D., None  Other Topics Concern   Not on file  Social History Narrative   Not on file   Social Drivers of Health   Financial Resource Strain: Patient Declined (08/29/2022)   Received from [facility]   Overall Financial Resource Strain (CARDIA)    Difficulty of Paying Living Expenses: Patient declined  Recent Concern: Financial Resource Strain - High Risk (06/14/2022)   Received from [facility]   Overall Financial Resource Strain (CARDIA)    Difficulty of Paying Living Expenses: Very hard  Food Insecurity: Patient Declined (08/29/2022)   Received from [facility]   Hunger Vital Sign    Worried About Running Out of Food in the Last Year: Patient declined    Ran Out of Food in the Last Year: Patient declined  Recent Concern: Food Insecurity - Food Insecurity Present (06/14/2022)   Received from [facility]    Hunger Vital Sign    Worried About Running Out of Food in the Last Year: Often true    Ran Out of Food in the Last Year: Often true  Transportation Needs: Patient Declined (08/29/2022)   Received from [facility] - Transportation    Lack of Transportation (Medical): Patient declined    Lack of Transportation (Non-Medical): Patient declined  Physical Activity: Unknown (08/29/2022)   Received from [facility]   Exercise Vital Sign    Days of Exercise per Week: Patient declined    Minutes of Exercise per Session: 30 min  Recent Concern: Physical Activity - Insufficiently Active (06/14/2022)   Received from [facility]   Exercise Vital Sign    Days of Exercise per Week: 2 days    Minutes of Exercise per Session: 30 min  Stress: Patient Declined (08/29/2022)  Received from [facility] - Occupational Stress Questionnaire    Feeling of Stress : Patient declined  Recent Concern: Stress - Stress Concern Present (06/14/2022)   Received from [facility] - Occupational Stress Questionnaire    Feeling of Stress : Very much  Social Connections: Socially Isolated (06/14/2022)   Received from [facility]   Social Network    How would you rate your social network (family, work, friends)?: Little participation, lonely and socially isolated      Psychiatric Specialty Exam:   Depression        Associated symptoms include no suicidal ideas.   Review of Systems  Cardiovascular:  Negative for chest pain.  Psychiatric/Behavioral:  Negative for substance abuse and suicidal ideas.     There were no vitals taken for this visit.There is no height or weight on file to calculate BMI.  General Appearance: fair  Eye Contact:  Good  Speech:  Clear and Coherent  Volume:  Normal  Mood: fair  Affect: congruent  Thought Process:  Coherent, Goal Directed, Intact, Linear and Logical   Orientation:  Full (Time, Place, and Person)  Thought Content:  WDL  Suicidal Thoughts:  No  Homicidal Thoughts:  No  Memory:  Immediate;   Good Recent;   Good Remote;   Good  Judgement:  Good  Insight:  Present  Psychomotor Activity:  Decreased  Concentration:  Fair  Recall:  Good  Fund of Knowledge:Good  Language: Good  Akathisia:  No  Handed:  Left  AIMS (if indicated):    Assets:  Communication Skills Desire for Improvement Financial Resources/Insurance Housing Resilience Transportation  ADL's:  Intact  Cognition: WNL  Sleep:  poor    Allergies:   Allergies  Allergen Reactions   Erythromycin Other (See Comments)    arrhythmia   Zocor   [Simvastatin -High Dose] Rash   Doxycycline Other (See Comments)    Decreased BP and caused dizziness per patient   Peroxide [Hydrogen Peroxide]    Current Medications: Current Outpatient Medications  Medication Sig Dispense Refill   atorvastatin  (LIPITOR) 10 MG tablet TAKE 1 TABLET(10 MG) BY MOUTH DAILY 90 tablet 1   buPROPion  (WELLBUTRIN  XL) 300 MG 24 hr tablet TAKE 1 TABLET EVERY DAY 90 tablet 0   busPIRone  (BUSPAR ) 10 MG tablet TAKE 1 TABLET TWICE DAILY 180 tablet 0   Calcium -Magnesium -Vitamin D  (CALCIUM  MAGNESIUM  PO) Take 1 tablet by mouth daily.     cetirizine (ZYRTEC) 10 MG tablet Take 10 mg by mouth daily.      Cholecalciferol (VITAMIN D ) 2000 units CAPS Take 1 capsule by mouth daily.     clotrimazole -betamethasone  (LOTRISONE ) cream Apply 1 application topically 2 (two) times daily. 30 g 0   cyclobenzaprine  (FLEXERIL ) 5 MG tablet   1   estradiol  (CLIMARA ) 0.05 mg/24hr patch Place 1 patch (0.05 mg total) onto the skin once a week. Twice weekly 24 patch 0   fluconazole  (DIFLUCAN ) 150 MG tablet 1 po x1, may repeat in 3 days prn (Patient not taking: Reported on 04/28/2019) 2 tablet 0   gabapentin (NEURONTIN) 300 MG capsule Take 3 capsules by mouth at bedtime. Takes 1AM + 4HS     glucose blood (ONE TOUCH ULTRA TEST) test strip  Test blood sugar once daily. Dx code: 250.00 100 each 12   L-Glutamine 500 MG TABS Take 1 tablet by mouth daily.     Lactobacillus (ACIDOPHILUS PO) Take  1 capsule by mouth daily. 2 Billion active cultures     lamoTRIgine  (LAMICTAL ) 100 MG tablet TAKE 1 TABLET DAILY IN ADDITION TO 200MG  TABLET DAILY 90 tablet 0   lamoTRIgine  (LAMICTAL ) 200 MG tablet TAKE 1 TABLET AT BEDTIME 90 tablet 0   leflunomide (ARAVA) 10 MG tablet Take by mouth.     levofloxacin  (LEVAQUIN ) 500 MG tablet Take 1 tablet (500 mg total) by mouth daily. (Patient not taking: Reported on 04/28/2019) 7 tablet 0   levonorgestrel  (MIRENA ) 20 MCG/24HR IUD 1 each by Intrauterine route once.     levothyroxine  (SYNTHROID ) 50 MCG tablet Take 1 tablet (50 mcg total) by mouth daily before breakfast. 90 tablet 1   lisinopril  (ZESTRIL ) 5 MG tablet Take 1 tablet (5 mg total) by mouth daily. 90 tablet 1   metFORMIN  (GLUCOPHAGE -XR) 500 MG 24 hr tablet TAKE 1 TABLET(500 MG) BY MOUTH EVERY EVENING 90 tablet 1   Multiple Vitamin (MULTIVITAMIN WITH MINERALS) TABS Take 1 tablet by mouth daily.     mupirocin ointment (BACTROBAN) 2 % APPLY TO LESION ON LEFT BREAST TWICE DAILY FOR 14 DAYS  0   naproxen sodium (ANAPROX) 220 MG tablet Take 220 mg by mouth.     ONETOUCH DELICA LANCETS 33G MISC Test blood sugar once daily. Dx code: 250.00 100 each 12   OVER THE COUNTER MEDICATION Vitamin B-12 5000 mcg 1 tablet daily.     OVER THE COUNTER MEDICATION Amberen 2 tablets every morning     oxyCODONE  10 MG TABS Take 1 tablet (10 mg total) by mouth every 6 (six) hours as needed (for pain). 60 tablet 0   Phenylephrine -Acetaminophen  (TYLENOL  SINUS CONGESTION/PAIN PO) Take 2 tablets by mouth daily as needed (for sinus congestion).      Sarilumab (KEVZARA) 200 MG/1. SOAJ Inject 200 mg into the skin. Every 2 weeks     terconazole  (TERAZOL 7 ) 0.4 % vaginal cream Place 1 applicator vaginally at bedtime. Use for seven days (Patient not taking: Reported on 04/28/2019) 45 g  0   tretinoin (RETIN-A) 0.025 % gel Apply 3 nights weekly x 2 weeks can increase to nightly only as tolerated     No current facility-administered medications for this visit.     Treatment Plan Summary: Medication management  Prior documentation reviewed    1. Depression: manageable, not taking wellbutrin  regularly, discussed the risk. Continue lamictal  at least 2. Anxiety ; manageable continue meds and positive thinking  3. Insomnia: did not voice concerns, continue sleep hygiene   4. Back pain:chronic.  Working with providers in related to RA and may wean down on pain meds    Fu 3-46m.  Wray Heady, MD  2:39 PM 03/12/2023

## 2023-05-04 ENCOUNTER — Other Ambulatory Visit (HOSPITAL_COMMUNITY): Payer: Self-pay | Admitting: Psychiatry

## 2023-05-14 ENCOUNTER — Other Ambulatory Visit (HOSPITAL_COMMUNITY): Payer: Self-pay | Admitting: Psychiatry

## 2023-07-09 ENCOUNTER — Telehealth (HOSPITAL_COMMUNITY): Payer: Medicare PPO | Admitting: Psychiatry

## 2023-07-16 ENCOUNTER — Other Ambulatory Visit (HOSPITAL_COMMUNITY): Payer: Self-pay | Admitting: Psychiatry

## 2023-08-01 ENCOUNTER — Encounter (HOSPITAL_COMMUNITY): Payer: Self-pay | Admitting: Psychiatry

## 2023-08-01 ENCOUNTER — Telehealth (HOSPITAL_COMMUNITY): Admitting: Psychiatry

## 2023-08-01 ENCOUNTER — Other Ambulatory Visit (HOSPITAL_COMMUNITY): Payer: Self-pay | Admitting: Psychiatry

## 2023-08-01 DIAGNOSIS — F419 Anxiety disorder, unspecified: Secondary | ICD-10-CM

## 2023-08-01 DIAGNOSIS — F32A Depression, unspecified: Secondary | ICD-10-CM

## 2023-08-01 DIAGNOSIS — F331 Major depressive disorder, recurrent, moderate: Secondary | ICD-10-CM

## 2023-08-01 DIAGNOSIS — Z638 Other specified problems related to primary support group: Secondary | ICD-10-CM

## 2023-08-01 DIAGNOSIS — F411 Generalized anxiety disorder: Secondary | ICD-10-CM

## 2023-08-01 MED ORDER — BUSPIRONE HCL 10 MG PO TABS: 10.0000 mg | ORAL_TABLET | Freq: Two times a day (BID) | ORAL | 0 refills | Status: DC

## 2023-08-01 NOTE — Progress Notes (Signed)
 Patient ID: Michelle Mueller, female   DOB: 1966-01-20, 58 y.o.   MRN: [id number]  Psychiatric Outpatient Follow up Visit  Patient Identification: Michelle Mueller MRN:  [id number] Date of Evaluation:  08/01/2023 Referral Source: Dr. Jamee Flash Chief Complaint:    depression . Follow up Visit Diagnosis: depression, anxiety Patient Active Problem List   Diagnosis Date Noted   Influenza [J11.1] 02/28/2018   Pansinusitis [J32.4] 02/28/2018   Rash [R21] 01/13/2018   Hypothyroidism [E03.9] 01/13/2018   Hyperlipidemia associated with type 2 diabetes mellitus (HCC) [E11.69, E78.5] 07/30/2016   Rheumatoid arthritis of multiple sites with negative rheumatoid factor (HCC) [M06.09] 03/13/2016   Chest pain of uncertain etiology [R07.9] 12/21/2014   Essential hypertension [I10] 12/21/2014   Myalgia [M79.10] 11/10/2014   Insomnia [G47.00] 11/10/2014   UTI (urinary tract infection) [N39.0] 08/04/2013   Fibromyalgia muscle pain [M79.7] 06/13/2013   Severe depression (HCC) [F32.2] 06/13/2013   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, without long-term current use of insulin (HCC) [E11.620] 06/13/2013   Rheumatoid arthritis (HCC) [M06.9] 03/19/2013   Fibromyalgia [M79.7] 03/19/2013   Hypothyroidism [E03.9] 03/19/2013   Goiter [E04.9] 03/19/2013   Obesity (BMI 30-39.9) [E66.9] 03/19/2013   Pseudoarthrosis of lumbar spine L4-L5 [S32.009K] 06/20/2012   Family history of pancreatic cancer [Z80.0] 01/18/2011   Chronic diarrhea [K52.9] 01/18/2011   Virtual Visit via Video Note  I connected with Michelle Mueller on 08/01/23 at 12:30 PM EDT by a video enabled telemedicine application and verified that I am speaking with the correct person using two identifiers.  Location: Patient: home Provider: home office   I discussed the limitations of evaluation and management by telemedicine and the availability of in person appointments. The patient expressed understanding and agreed to  proceed.     I discussed the assessment and treatment plan with the patient. The patient was provided an opportunity to ask questions and all were answered. The patient agreed with the plan and demonstrated an understanding of the instructions.   The patient was advised to call back or seek an in-person evaluation if the symptoms worsen or if the condition fails to improve as anticipated.  I provided 18 minutes of non-face-to-face time during this encounter.      HPI: Patient is a 58 years old currently single Caucasian female with diagnosis of depression anxiety  Mood wise she has ups and downs but managing it reasonable she does have a supportive son was living with her.  She still suffers from pain and arthritis and has failed medications they are planning now to lower the dose of oxycodone  since it has been there for many years and may be losing its efficacy or causing more pain  In general there is no rash reported on side effects on the medication and she tries to keep her still busy and engaged she does have a supportive dog  May wean slowly down on oxycodone  this year   Modifying factor:friends, sons Duration : adult life Severity : effected by RA flare ups, adjusting to it ,  Past Medical History:  Past Medical History:  Diagnosis Date   Anxiety    Arthritis    Asthma    last attack 3 yrs ago   Chicken pox    Depression    Diabetes mellitus without complication (HCC)    Fibromyalgia    Goiter    Hypertension    dr Tommas   Hypothyroidism    Ovarian cyst    PONV (postoperative nausea  and vomiting)     Past Surgical History:  Procedure Laterality Date   ABDOMINAL EXPOSURE  12/18/2011   Procedure: ABDOMINAL EXPOSURE;  Surgeon: Krystal JULIANNA Doing, MD;  Location: [facility];  Service: Vascular;  Laterality: N/A;  Anterior Expossure for anterior lumbar interbody fuson   ANTERIOR LUMBAR FUSION  12/18/2011   Procedure: ANTERIOR LUMBAR FUSION 1 LEVEL;  Surgeon: Victory Gens, MD;  Location: [facility];  Service: Neurosurgery;  Laterality: N/A;  Lumbar four-five Anterior Lumbar Interbody Fusion with Dr. Doing to do anterior exposure   CESAREAN SECTION     CHOLECYSTECTOMY     HERNIA REPAIR     LIPOMA EXCISION     L breast   TONSILLECTOMY     TUMOR EXCISION     Nerve sheath tumor, R arm   Family History:  Family History  Problem Relation Age of Onset   Heart disease Father    Lung cancer Father    Colon cancer Maternal Aunt    Pancreatic cancer Maternal Grandmother    Pancreatic cancer Maternal Grandfather    Pancreatic cancer Paternal Grandmother    Cancer Paternal Grandfather    Sexual abuse Neg Hx    Social History:   Social History   Socioeconomic History   Marital status: Divorced    Spouse name: Not on file   Number of children: Not on file   Years of education: Not on file   Highest education level: Not on file  Occupational History   Not on file  Tobacco Use   Smoking status: Never   Smokeless tobacco: Never  Vaping Use   Vaping status: Never Used  Substance and Sexual Activity   Alcohol use: No    Alcohol/week: 0.0 standard drinks of alcohol   Drug use: No   Sexual activity: Not Currently    Birth control/protection: I.U.D., None  Other Topics Concern   Not on file  Social History Narrative   Not on file   Social Drivers of Health   Financial Resource Strain: Patient Declined (08/29/2022)   Received from [facility]   Overall Financial Resource Strain (CARDIA)    Difficulty of Paying Living Expenses: Patient declined  Recent Concern: Financial Resource Strain - High Risk (06/14/2022)   Received from [facility]   Overall Financial Resource Strain (CARDIA)    Difficulty of Paying Living Expenses: Very hard  Food Insecurity: Patient Declined (08/29/2022)   [facility]   Hunger Vital Sign    Within the past 12 months, you worried that your food would run out before you got the money to buy more.:  Patient declined    Within the past 12 months, the food you bought just didn't last and you didn't have money to get more.: Patient declined  Recent Concern: Food Insecurity - Food Insecurity Present (06/14/2022)   Received from [facility]   Hunger Vital Sign    Worried About Running Out of Food in the Last Year: Often true    Ran Out of Food in the Last Year: Often true  Transportation Needs: Patient Declined (08/29/2022)   Received from [facility] - Transportation    Lack of Transportation (Medical): Patient declined    Lack of Transportation (Non-Medical): Patient declined  Physical Activity: Unknown (08/29/2022)   Received from [facility]   Exercise Vital Sign    On average, how many days per week do you engage in moderate to strenuous exercise (like a brisk  walk)?: Patient declined    Minutes of Exercise per Session: Not on file  Recent Concern: Physical Activity - Insufficiently Active (06/14/2022)   Received from [facility]   Exercise Vital Sign    Days of Exercise per Week: 2 days    Minutes of Exercise per Session: 30 min  Stress: Patient Declined (08/29/2022)   Received from [facility] - Occupational Stress Questionnaire    Feeling of Stress : Patient declined  Recent Concern: Stress - Stress Concern Present (06/14/2022)   Received from [facility] - Occupational Stress Questionnaire    Feeling of Stress : Very much  Social Connections: Socially Isolated (06/14/2022)   Received from [facility]   Social Network    How would you rate your social network (family, work, friends)?: Little participation, lonely and socially isolated      Psychiatric Specialty Exam:   Depression        Associated symptoms include no suicidal ideas.   Review of Systems  Cardiovascular:  Negative for chest pain.  Psychiatric/Behavioral:  Negative for substance abuse and suicidal  ideas.     There were no vitals taken for this visit.There is no height or weight on file to calculate BMI.  General Appearance: fair  Eye Contact:  Good  Speech:  Clear and Coherent  Volume:  Normal  Mood: fair  Affect: congruent  Thought Process:  Coherent, Goal Directed, Intact, Linear and Logical  Orientation:  Full (Time, Place, and Person)  Thought Content:  WDL  Suicidal Thoughts:  No  Homicidal Thoughts:  No  Memory:  Immediate;   Good Recent;   Good Remote;   Good  Judgement:  Good  Insight:  Present  Psychomotor Activity:  Decreased  Concentration:  Fair  Recall:  Good  Fund of Knowledge:Good  Language: Good  Akathisia:  No  Handed:  Left  AIMS (if indicated):    Assets:  Communication Skills Desire for Improvement Financial Resources/Insurance Housing Resilience Transportation  ADL's:  Intact  Cognition: WNL  Sleep:  poor    Allergies:   Allergies  Allergen Reactions   Erythromycin Other (See Comments)    arrhythmia   Zocor   [Simvastatin -High Dose] Rash   Doxycycline Other (See Comments)    Decreased BP and caused dizziness per patient   Peroxide [Hydrogen Peroxide]    Current Medications: Current Outpatient Medications  Medication Sig Dispense Refill   atorvastatin  (LIPITOR) 10 MG tablet TAKE 1 TABLET(10 MG) BY MOUTH DAILY 90 tablet 1   busPIRone  (BUSPAR ) 10 MG tablet Take 1 tablet (10 mg total) by mouth 2 (two) times daily. 180 tablet 0   Calcium -Magnesium -Vitamin D  (CALCIUM  MAGNESIUM  PO) Take 1 tablet by mouth daily.     cetirizine (ZYRTEC) 10 MG tablet Take 10 mg by mouth daily.      Cholecalciferol (VITAMIN D ) 2000 units CAPS Take 1 capsule by mouth daily.     clotrimazole -betamethasone  (LOTRISONE ) cream Apply 1 application topically 2 (two) times daily. 30 g 0   cyclobenzaprine  (FLEXERIL ) 5 MG tablet   1   estradiol  (CLIMARA ) 0.05 mg/24hr patch Place 1 patch (0.05 mg total) onto the skin once a week. Twice weekly 24 patch 0   fluconazole   (DIFLUCAN ) 150 MG tablet 1 po x1, may repeat in 3 days prn (Patient not taking: Reported on 04/28/2019) 2 tablet 0   gabapentin (NEURONTIN) 300 MG capsule Take 3 capsules by mouth at  bedtime. Takes 1AM + 4HS     glucose blood (ONE TOUCH ULTRA TEST) test strip Test blood sugar once daily. Dx code: 250.00 100 each 12   L-Glutamine 500 MG TABS Take 1 tablet by mouth daily.     Lactobacillus (ACIDOPHILUS PO) Take 1 capsule by mouth daily. 2 Billion active cultures     lamoTRIgine  (LAMICTAL ) 100 MG tablet TAKE 1 TABLET DAILY IN ADDITION TO 200MG  TABLET DAILY 90 tablet 0   lamoTRIgine  (LAMICTAL ) 200 MG tablet TAKE 1 TABLET AT BEDTIME 90 tablet 0   leflunomide (ARAVA) 10 MG tablet Take by mouth.     levofloxacin  (LEVAQUIN ) 500 MG tablet Take 1 tablet (500 mg total) by mouth daily. (Patient not taking: Reported on 04/28/2019) 7 tablet 0   levonorgestrel  (MIRENA ) 20 MCG/24HR IUD 1 each by Intrauterine route once.     levothyroxine  (SYNTHROID ) 50 MCG tablet Take 1 tablet (50 mcg total) by mouth daily before breakfast. 90 tablet 1   lisinopril  (ZESTRIL ) 5 MG tablet Take 1 tablet (5 mg total) by mouth daily. 90 tablet 1   metFORMIN  (GLUCOPHAGE -XR) 500 MG 24 hr tablet TAKE 1 TABLET(500 MG) BY MOUTH EVERY EVENING 90 tablet 1   Multiple Vitamin (MULTIVITAMIN WITH MINERALS) TABS Take 1 tablet by mouth daily.     mupirocin ointment (BACTROBAN) 2 % APPLY TO LESION ON LEFT BREAST TWICE DAILY FOR 14 DAYS  0   naproxen sodium (ANAPROX) 220 MG tablet Take 220 mg by mouth.     ONETOUCH DELICA LANCETS 33G MISC Test blood sugar once daily. Dx code: 250.00 100 each 12   OVER THE COUNTER MEDICATION Vitamin B-12 5000 mcg 1 tablet daily.     OVER THE COUNTER MEDICATION Amberen 2 tablets every morning     oxyCODONE  10 MG TABS Take 1 tablet (10 mg total) by mouth every 6 (six) hours as needed (for pain). 60 tablet 0   Phenylephrine -Acetaminophen  (TYLENOL  SINUS CONGESTION/PAIN PO) Take 2 tablets by mouth daily as needed (for  sinus congestion).      Sarilumab (KEVZARA) 200 MG/1. SOAJ Inject 200 mg into the skin. Every 2 weeks     terconazole  (TERAZOL 7 ) 0.4 % vaginal cream Place 1 applicator vaginally at bedtime. Use for seven days (Patient not taking: Reported on 04/28/2019) 45 g 0   tretinoin (RETIN-A) 0.025 % gel Apply 3 nights weekly x 2 weeks can increase to nightly only as tolerated     No current facility-administered medications for this visit.     Treatment Plan Summary: Medication management Prior documentation reviewed    1. Depression: Some up-and-down days but overall managing it fair continue Lamictal  at a dose of 300 mg  2. Anxiety ; stressed will depend upon the pain and circumstances continue coping skills and BuSpar  is 2 times a day that does help   3. Insomnia: did not voice concerns, continue sleep hygiene   4. Back pain:chronic.  Working with providers in related to RA and may wean down on pain meds    Fu 3-5m.  Jackey Flight, MD  12:42 PM 08/01/2023

## 2023-09-28 ENCOUNTER — Other Ambulatory Visit (HOSPITAL_COMMUNITY): Payer: Self-pay | Admitting: Psychiatry

## 2023-09-28 NOTE — Telephone Encounter (Signed)
 Last OV-08/01/23. Please advise

## 2023-10-15 ENCOUNTER — Other Ambulatory Visit (HOSPITAL_COMMUNITY): Payer: Self-pay | Admitting: Psychiatry

## 2023-12-05 ENCOUNTER — Telehealth (INDEPENDENT_AMBULATORY_CARE_PROVIDER_SITE_OTHER): Admitting: Psychiatry

## 2023-12-05 ENCOUNTER — Encounter (HOSPITAL_COMMUNITY): Payer: Self-pay | Admitting: Psychiatry

## 2023-12-05 ENCOUNTER — Other Ambulatory Visit (HOSPITAL_COMMUNITY): Payer: Self-pay | Admitting: Psychiatry

## 2023-12-05 DIAGNOSIS — M5489 Other dorsalgia: Secondary | ICD-10-CM

## 2023-12-05 DIAGNOSIS — F411 Generalized anxiety disorder: Secondary | ICD-10-CM

## 2023-12-05 DIAGNOSIS — F331 Major depressive disorder, recurrent, moderate: Secondary | ICD-10-CM

## 2023-12-05 DIAGNOSIS — G8929 Other chronic pain: Secondary | ICD-10-CM

## 2023-12-05 DIAGNOSIS — G4700 Insomnia, unspecified: Secondary | ICD-10-CM

## 2023-12-05 MED ORDER — LAMOTRIGINE 200 MG PO TABS: 200.0000 mg | ORAL_TABLET | Freq: Every day | ORAL | 0 refills | Status: DC

## 2023-12-05 MED ORDER — LAMOTRIGINE 100 MG PO TABS: ORAL_TABLET | ORAL | 0 refills | Status: DC

## 2023-12-05 NOTE — Progress Notes (Signed)
 Patient ID: Michelle Mueller, female   DOB: 1965/10/28, 58 y.o.   MRN: [id number]  Psychiatric Outpatient Follow up Visit  Patient Identification: Michelle Mueller MRN:  [id number] Date of Evaluation:  12/05/2023 Referral Source: Dr. Jamee Flash Chief Complaint:    depression . Follow up Visit Diagnosis: depression, anxiety Patient Active Problem List   Diagnosis Date Noted   Influenza [J11.1] 02/28/2018   Pansinusitis [J32.4] 02/28/2018   Rash [R21] 01/13/2018   Hypothyroidism [E03.9] 01/13/2018   Hyperlipidemia associated with type 2 diabetes mellitus (HCC) [E11.69, E78.5] 07/30/2016   Rheumatoid arthritis of multiple sites with negative rheumatoid factor (HCC) [M06.09] 03/13/2016   Chest pain of uncertain etiology [R07.9] 12/21/2014   Essential hypertension [I10] 12/21/2014   Myalgia [M79.10] 11/10/2014   Insomnia [G47.00] 11/10/2014   UTI (urinary tract infection) [N39.0] 08/04/2013   Fibromyalgia muscle pain [M79.7] 06/13/2013   Severe depression (HCC) [F32.2] 06/13/2013   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, without long-term current use of insulin (HCC) [E11.620] 06/13/2013   Rheumatoid arthritis (HCC) [M06.9] 03/19/2013   Fibromyalgia [M79.7] 03/19/2013   Hypothyroidism [E03.9] 03/19/2013   Goiter [E04.9] 03/19/2013   Obesity (BMI 30-39.9) [E66.9] 03/19/2013   Pseudoarthrosis of lumbar spine L4-L5 [S32.009K] 06/20/2012   Family history of pancreatic cancer [Z80.0] 01/18/2011   Chronic diarrhea [K52.9] 01/18/2011   Virtual Visit via Video Note  I connected with Michelle Mueller on 12/05/23 at  1:30 PM EST by a video enabled telemedicine application and verified that I am speaking with the correct person using two identifiers.  Location: Patient: home Provider: home office   I discussed the limitations of evaluation and management by telemedicine and the availability of in person appointments. The patient expressed understanding and agreed to  proceed.     I discussed the assessment and treatment plan with the patient. The patient was provided an opportunity to ask questions and all were answered. The patient agreed with the plan and demonstrated an understanding of the instructions.   The patient was advised to call back or seek an in-person evaluation if the symptoms worsen or if the condition fails to improve as anticipated.  I provided 15 minutes of non-face-to-face time during this encounter.   HPI: Patient is a 58 years old currently single Caucasian female with diagnosis of depression anxiety On evaluation patient is doing reasonable she does have some down days here and there she does have the support of her son.  She is still on oxycodone  for her pain management but has not yet been able to find an on arthritis medication  Mood is fair there is no rash or side effect reported on medication  Modifying factor:friends, sons Duration : adult life Severity : effected by RA flare ups, adjusting to it ,  Past Medical History:  Past Medical History:  Diagnosis Date   Anxiety    Arthritis    Asthma    last attack 3 yrs ago   Chicken pox    Depression    Diabetes mellitus without complication (HCC)    Fibromyalgia    Goiter    Hypertension    dr Tommas   Hypothyroidism    Ovarian cyst    PONV (postoperative nausea and vomiting)     Past Surgical History:  Procedure Laterality Date   ABDOMINAL EXPOSURE  12/18/2011   Procedure: ABDOMINAL EXPOSURE;  Surgeon: Krystal JULIANNA Doing, MD;  Location: [facility];  Service: Vascular;  Laterality: N/A;  Anterior Expossure for  anterior lumbar interbody fuson   ANTERIOR LUMBAR FUSION  12/18/2011   Procedure: ANTERIOR LUMBAR FUSION 1 LEVEL;  Surgeon: Victory Gens, MD;  Location: [facility];  Service: Neurosurgery;  Laterality: N/A;  Lumbar four-five Anterior Lumbar Interbody Fusion with Dr. Oris to do anterior exposure   CESAREAN SECTION     CHOLECYSTECTOMY     HERNIA REPAIR      LIPOMA EXCISION     L breast   TONSILLECTOMY     TUMOR EXCISION     Nerve sheath tumor, R arm   Family History:  Family History  Problem Relation Age of Onset   Heart disease Father    Lung cancer Father    Colon cancer Maternal Aunt    Pancreatic cancer Maternal Grandmother    Pancreatic cancer Maternal Grandfather    Pancreatic cancer Paternal Grandmother    Cancer Paternal Grandfather    Sexual abuse Neg Hx    Social History:   Social History   Socioeconomic History   Marital status: Divorced    Spouse name: Not on file   Number of children: Not on file   Years of education: Not on file   Highest education level: Not on file  Occupational History   Not on file  Tobacco Use   Smoking status: Never   Smokeless tobacco: Never  Vaping Use   Vaping status: Never Used  Substance and Sexual Activity   Alcohol use: No    Alcohol/week: 0.0 standard drinks of alcohol   Drug use: No   Sexual activity: Not Currently    Birth control/protection: I.U.D., None  Other Topics Concern   Not on file  Social History Narrative   Not on file   Social Drivers of Health   Financial Resource Strain: High Risk (08/22/2023)   Received from [facility]   Overall Financial Resource Strain (CARDIA)    How hard is it for you to pay for the very basics like food, housing, medical care, and heating?: Very hard  Food Insecurity: Food Insecurity Present (08/22/2023)   Received from [facility]   Hunger Vital Sign    Within the past 12 months, you worried that your food would run out before you got the money to buy more.: Sometimes true    Within the past 12 months, the food you bought just didn't last and you didn't have money to get more.: Never true  Transportation Needs: No Transportation Needs (08/22/2023)   Received from [facility] - Transportation    In the past 12 months, has lack of transportation kept you from medical appointments or from getting medications?: No     In the past 12 months, has lack of transportation kept you from meetings, work, or from getting things needed for daily living?: No  Physical Activity: Inactive (08/22/2023)   Received from [facility]   Exercise Vital Sign    On average, how many days per week do you engage in moderate to strenuous exercise (like a brisk walk)?: 0 days    Minutes of Exercise per Session: Not on file  Stress: Stress Concern Present (08/22/2023)   Received from [facility] - Occupational Stress Questionnaire    Do you feel stress - tense, restless, nervous, or anxious, or unable to sleep at night because your mind is troubled all the time - these days?: Rather much  Social Connections: Moderately Integrated (08/22/2023)   Received from [facility]  Social Network    How would you rate your social network (family, work, friends)?: Adequate participation with social networks      Psychiatric Specialty Exam:   Depression        Associated symptoms include no suicidal ideas.   Review of Systems  Cardiovascular:  Negative for chest pain.  Psychiatric/Behavioral:  Negative for substance abuse and suicidal ideas.     There were no vitals taken for this visit.There is no height or weight on file to calculate BMI.  General Appearance: fair  Eye Contact:  Good  Speech:  Clear and Coherent  Volume:  Normal  Mood: fair  Affect: congruent  Thought Process:  Coherent, Goal Directed, Intact, Linear and Logical  Orientation:  Full (Time, Place, and Person)  Thought Content:  WDL  Suicidal Thoughts:  No  Homicidal Thoughts:  No  Memory:  Immediate;   Good Recent;   Good Remote;   Good  Judgement:  Good  Insight:  Present  Psychomotor Activity:  Decreased  Concentration:  Fair  Recall:  Good  Fund of Knowledge:Good  Language: Good  Akathisia:  No  Handed:  Left  AIMS (if indicated):    Assets:  Communication Skills Desire for Improvement Financial  Resources/Insurance Housing Resilience Transportation  ADL's:  Intact  Cognition: WNL  Sleep:  poor    Allergies:   Allergies  Allergen Reactions   Erythromycin Other (See Comments)    arrhythmia   Zocor   [Simvastatin -High Dose] Rash   Doxycycline Other (See Comments)    Decreased BP and caused dizziness per patient   Peroxide [Hydrogen Peroxide]    Current Medications: Current Outpatient Medications  Medication Sig Dispense Refill   atorvastatin  (LIPITOR) 10 MG tablet TAKE 1 TABLET(10 MG) BY MOUTH DAILY 90 tablet 1   busPIRone  (BUSPAR ) 10 MG tablet TAKE 1 TABLET TWICE DAILY 180 tablet 0   Calcium -Magnesium -Vitamin D  (CALCIUM  MAGNESIUM  PO) Take 1 tablet by mouth daily.     cetirizine (ZYRTEC) 10 MG tablet Take 10 mg by mouth daily.      Cholecalciferol (VITAMIN D ) 2000 units CAPS Take 1 capsule by mouth daily.     clotrimazole -betamethasone  (LOTRISONE ) cream Apply 1 application topically 2 (two) times daily. 30 g 0   cyclobenzaprine  (FLEXERIL ) 5 MG tablet   1   estradiol  (CLIMARA ) 0.05 mg/24hr patch Place 1 patch (0.05 mg total) onto the skin once a week. Twice weekly 24 patch 0   fluconazole  (DIFLUCAN ) 150 MG tablet 1 po x1, may repeat in 3 days prn (Patient not taking: Reported on 04/28/2019) 2 tablet 0   gabapentin (NEURONTIN) 300 MG capsule Take 3 capsules by mouth at bedtime. Takes 1AM + 4HS     glucose blood (ONE TOUCH ULTRA TEST) test strip Test blood sugar once daily. Dx code: 250.00 100 each 12   L-Glutamine 500 MG TABS Take 1 tablet by mouth daily.     Lactobacillus (ACIDOPHILUS PO) Take 1 capsule by mouth daily. 2 Billion active cultures     lamoTRIgine  (LAMICTAL ) 100 MG tablet TAKE 1 TABLET DAILY IN ADDITION TO 200MG  TABLET DAILY 90 tablet 0   lamoTRIgine  (LAMICTAL ) 200 MG tablet Take 1 tablet (200 mg total) by mouth at bedtime. 90 tablet 0   leflunomide (ARAVA) 10 MG tablet Take by mouth.     levofloxacin  (LEVAQUIN ) 500 MG tablet Take 1 tablet (500 mg total) by mouth  daily. (Patient not taking: Reported on 04/28/2019) 7 tablet 0   levonorgestrel  (MIRENA ) 20 MCG/24HR  IUD 1 each by Intrauterine route once.     levothyroxine  (SYNTHROID ) 50 MCG tablet Take 1 tablet (50 mcg total) by mouth daily before breakfast. 90 tablet 1   lisinopril  (ZESTRIL ) 5 MG tablet Take 1 tablet (5 mg total) by mouth daily. 90 tablet 1   metFORMIN  (GLUCOPHAGE -XR) 500 MG 24 hr tablet TAKE 1 TABLET(500 MG) BY MOUTH EVERY EVENING 90 tablet 1   Multiple Vitamin (MULTIVITAMIN WITH MINERALS) TABS Take 1 tablet by mouth daily.     mupirocin ointment (BACTROBAN) 2 % APPLY TO LESION ON LEFT BREAST TWICE DAILY FOR 14 DAYS  0   naproxen sodium (ANAPROX) 220 MG tablet Take 220 mg by mouth.     ONETOUCH DELICA LANCETS 33G MISC Test blood sugar once daily. Dx code: 250.00 100 each 12   OVER THE COUNTER MEDICATION Vitamin B-12 5000 mcg 1 tablet daily.     OVER THE COUNTER MEDICATION Amberen 2 tablets every morning     oxyCODONE  10 MG TABS Take 1 tablet (10 mg total) by mouth every 6 (six) hours as needed (for pain). 60 tablet 0   Phenylephrine -Acetaminophen  (TYLENOL  SINUS CONGESTION/PAIN PO) Take 2 tablets by mouth daily as needed (for sinus congestion).      Sarilumab (KEVZARA) 200 MG/1. SOAJ Inject 200 mg into the skin. Every 2 weeks     terconazole  (TERAZOL 7 ) 0.4 % vaginal cream Place 1 applicator vaginally at bedtime. Use for seven days (Patient not taking: Reported on 04/28/2019) 45 g 0   tretinoin (RETIN-A) 0.025 % gel Apply 3 nights weekly x 2 weeks can increase to nightly only as tolerated     No current facility-administered medications for this visit.     Treatment Plan Summary: Prior documentation reviewed    1. Depression: Manageable continue Lamictal  at a dose of 300 mg  2. Anxiety ; fluctuates but continue coping skills and BuSpar  2 times a day   3. Insomnia: did not voice concerns, continue sleep hygiene   4. Back pain:chronic.  Working with providers in related to RA  and may wean down on pain meds   Medication refill sent follow-up in 6 months or earlier if needed Jackey Flight, MD  1:37 PM 12/05/2023

## 2024-02-15 ENCOUNTER — Other Ambulatory Visit (HOSPITAL_COMMUNITY): Payer: Self-pay | Admitting: Psychiatry

## 2024-02-17 ENCOUNTER — Other Ambulatory Visit (HOSPITAL_COMMUNITY): Payer: Self-pay | Admitting: Psychiatry

## 2024-06-04 ENCOUNTER — Telehealth (HOSPITAL_COMMUNITY): Admitting: Psychiatry
